# Patient Record
Sex: Male | Born: 1945 | Race: Black or African American | Hispanic: No | Smoking: Former smoker
Health system: Southern US, Community
[De-identification: ages and names within clinical notes are randomized; demographics above are authoritative.]

## PROBLEM LIST (undated history)

## (undated) DIAGNOSIS — M545 Low back pain: Secondary | ICD-10-CM

## (undated) DIAGNOSIS — I429 Cardiomyopathy, unspecified: Secondary | ICD-10-CM

## (undated) DIAGNOSIS — J45909 Unspecified asthma, uncomplicated: Secondary | ICD-10-CM

## (undated) DIAGNOSIS — Z9289 Personal history of other medical treatment: Secondary | ICD-10-CM

## (undated) DIAGNOSIS — K219 Gastro-esophageal reflux disease without esophagitis: Secondary | ICD-10-CM

## (undated) DIAGNOSIS — Z8719 Personal history of other diseases of the digestive system: Secondary | ICD-10-CM

## (undated) DIAGNOSIS — M79609 Pain in unspecified limb: Secondary | ICD-10-CM

## (undated) DIAGNOSIS — I2510 Atherosclerotic heart disease of native coronary artery without angina pectoris: Secondary | ICD-10-CM

## (undated) DIAGNOSIS — M109 Gout, unspecified: Secondary | ICD-10-CM

## (undated) DIAGNOSIS — I509 Heart failure, unspecified: Secondary | ICD-10-CM

## (undated) DIAGNOSIS — K922 Gastrointestinal hemorrhage, unspecified: Secondary | ICD-10-CM

## (undated) DIAGNOSIS — E119 Type 2 diabetes mellitus without complications: Secondary | ICD-10-CM

## (undated) DIAGNOSIS — J189 Pneumonia, unspecified organism: Secondary | ICD-10-CM

## (undated) DIAGNOSIS — R0602 Shortness of breath: Secondary | ICD-10-CM

## (undated) DIAGNOSIS — I10 Essential (primary) hypertension: Secondary | ICD-10-CM

## (undated) DIAGNOSIS — I219 Acute myocardial infarction, unspecified: Secondary | ICD-10-CM

## (undated) DIAGNOSIS — E7800 Pure hypercholesterolemia, unspecified: Secondary | ICD-10-CM

## (undated) DIAGNOSIS — M1990 Unspecified osteoarthritis, unspecified site: Secondary | ICD-10-CM

## (undated) DIAGNOSIS — R569 Unspecified convulsions: Secondary | ICD-10-CM

## (undated) DIAGNOSIS — J449 Chronic obstructive pulmonary disease, unspecified: Secondary | ICD-10-CM

## (undated) HISTORY — DX: Pain in unspecified limb: M79.609

## (undated) HISTORY — DX: Type 2 diabetes mellitus without complications: E11.9

## (undated) HISTORY — DX: Personal history of other medical treatment: Z92.89

## (undated) HISTORY — DX: Low back pain: M54.5

## (undated) HISTORY — DX: Cardiomyopathy, unspecified: I42.9

## (undated) HISTORY — DX: Chronic obstructive pulmonary disease, unspecified: J44.9

## (undated) HISTORY — DX: Essential (primary) hypertension: I10

## (undated) HISTORY — PX: CORONARY ANGIOPLASTY WITH STENT PLACEMENT: SHX49

## (undated) HISTORY — DX: Unspecified convulsions: R56.9

## (undated) HISTORY — DX: Personal history of other diseases of the digestive system: Z87.19

## (undated) HISTORY — DX: Unspecified asthma, uncomplicated: J45.909

---

## 1998-09-29 ENCOUNTER — Encounter: Payer: Self-pay | Admitting: Emergency Medicine

## 1998-09-30 ENCOUNTER — Observation Stay (HOSPITAL_COMMUNITY): Admission: EM | Admit: 1998-09-30 | Discharge: 1998-10-01 | Payer: Self-pay | Admitting: Emergency Medicine

## 1998-10-05 ENCOUNTER — Encounter: Admission: RE | Admit: 1998-10-05 | Discharge: 1998-10-05 | Payer: Self-pay | Admitting: Family Medicine

## 1999-08-26 LAB — HM SIGMOIDOSCOPY

## 2000-04-11 ENCOUNTER — Encounter: Payer: Self-pay | Admitting: Emergency Medicine

## 2000-04-11 ENCOUNTER — Inpatient Hospital Stay (HOSPITAL_COMMUNITY): Admission: EM | Admit: 2000-04-11 | Discharge: 2000-04-14 | Payer: Self-pay | Admitting: Emergency Medicine

## 2000-04-13 ENCOUNTER — Encounter: Payer: Self-pay | Admitting: Endocrinology

## 2000-12-30 ENCOUNTER — Emergency Department (HOSPITAL_COMMUNITY): Admission: EM | Admit: 2000-12-30 | Discharge: 2000-12-31 | Payer: Self-pay | Admitting: Emergency Medicine

## 2000-12-30 ENCOUNTER — Encounter: Payer: Self-pay | Admitting: Emergency Medicine

## 2001-10-01 ENCOUNTER — Emergency Department (HOSPITAL_COMMUNITY): Admission: EM | Admit: 2001-10-01 | Discharge: 2001-10-02 | Payer: Self-pay | Admitting: Emergency Medicine

## 2001-10-01 ENCOUNTER — Encounter: Payer: Self-pay | Admitting: Emergency Medicine

## 2001-10-03 ENCOUNTER — Encounter: Payer: Self-pay | Admitting: Emergency Medicine

## 2001-10-04 ENCOUNTER — Inpatient Hospital Stay (HOSPITAL_COMMUNITY): Admission: EM | Admit: 2001-10-04 | Discharge: 2001-10-09 | Payer: Self-pay | Admitting: Emergency Medicine

## 2001-10-08 ENCOUNTER — Encounter: Payer: Self-pay | Admitting: Internal Medicine

## 2001-10-19 ENCOUNTER — Encounter: Admission: RE | Admit: 2001-10-19 | Discharge: 2002-01-17 | Payer: Self-pay | Admitting: Internal Medicine

## 2002-05-24 ENCOUNTER — Emergency Department (HOSPITAL_COMMUNITY): Admission: EM | Admit: 2002-05-24 | Discharge: 2002-05-24 | Payer: Self-pay | Admitting: *Deleted

## 2002-07-22 ENCOUNTER — Emergency Department (HOSPITAL_COMMUNITY): Admission: EM | Admit: 2002-07-22 | Discharge: 2002-07-22 | Payer: Self-pay | Admitting: Emergency Medicine

## 2004-06-12 ENCOUNTER — Emergency Department (HOSPITAL_COMMUNITY): Admission: EM | Admit: 2004-06-12 | Discharge: 2004-06-12 | Payer: Self-pay | Admitting: Family Medicine

## 2004-06-13 ENCOUNTER — Emergency Department (HOSPITAL_COMMUNITY): Admission: EM | Admit: 2004-06-13 | Discharge: 2004-06-13 | Payer: Self-pay | Admitting: Family Medicine

## 2004-06-17 ENCOUNTER — Emergency Department (HOSPITAL_COMMUNITY): Admission: EM | Admit: 2004-06-17 | Discharge: 2004-06-17 | Payer: Self-pay | Admitting: Family Medicine

## 2004-10-09 ENCOUNTER — Emergency Department (HOSPITAL_COMMUNITY): Admission: EM | Admit: 2004-10-09 | Discharge: 2004-10-09 | Payer: Self-pay | Admitting: Family Medicine

## 2004-11-25 ENCOUNTER — Emergency Department (HOSPITAL_COMMUNITY): Admission: EM | Admit: 2004-11-25 | Discharge: 2004-11-25 | Payer: Self-pay | Admitting: Family Medicine

## 2004-12-29 ENCOUNTER — Ambulatory Visit: Payer: Self-pay | Admitting: Internal Medicine

## 2004-12-30 ENCOUNTER — Observation Stay (HOSPITAL_COMMUNITY): Admission: EM | Admit: 2004-12-30 | Discharge: 2004-12-30 | Payer: Self-pay | Admitting: Emergency Medicine

## 2005-01-09 ENCOUNTER — Ambulatory Visit: Payer: Self-pay | Admitting: Internal Medicine

## 2005-02-01 ENCOUNTER — Emergency Department (HOSPITAL_COMMUNITY): Admission: EM | Admit: 2005-02-01 | Discharge: 2005-02-01 | Payer: Self-pay | Admitting: Family Medicine

## 2005-04-14 ENCOUNTER — Ambulatory Visit: Payer: Self-pay | Admitting: Internal Medicine

## 2005-04-23 ENCOUNTER — Ambulatory Visit: Payer: Self-pay | Admitting: Internal Medicine

## 2005-05-21 ENCOUNTER — Emergency Department (HOSPITAL_COMMUNITY): Admission: EM | Admit: 2005-05-21 | Discharge: 2005-05-21 | Payer: Self-pay | Admitting: Emergency Medicine

## 2005-05-26 ENCOUNTER — Ambulatory Visit: Payer: Self-pay | Admitting: Internal Medicine

## 2005-06-05 ENCOUNTER — Ambulatory Visit: Payer: Self-pay | Admitting: Cardiology

## 2005-06-18 ENCOUNTER — Emergency Department (HOSPITAL_COMMUNITY): Admission: EM | Admit: 2005-06-18 | Discharge: 2005-06-18 | Payer: Self-pay | Admitting: Family Medicine

## 2005-07-25 ENCOUNTER — Emergency Department (HOSPITAL_COMMUNITY): Admission: EM | Admit: 2005-07-25 | Discharge: 2005-07-25 | Payer: Self-pay | Admitting: Family Medicine

## 2005-09-29 ENCOUNTER — Emergency Department (HOSPITAL_COMMUNITY): Admission: EM | Admit: 2005-09-29 | Discharge: 2005-09-29 | Payer: Self-pay | Admitting: Family Medicine

## 2005-10-25 ENCOUNTER — Emergency Department (HOSPITAL_COMMUNITY): Admission: EM | Admit: 2005-10-25 | Discharge: 2005-10-25 | Payer: Self-pay | Admitting: Family Medicine

## 2005-11-21 ENCOUNTER — Inpatient Hospital Stay (HOSPITAL_COMMUNITY): Admission: EM | Admit: 2005-11-21 | Discharge: 2005-12-01 | Payer: Self-pay | Admitting: Family Medicine

## 2005-11-21 ENCOUNTER — Ambulatory Visit: Payer: Self-pay | Admitting: Sports Medicine

## 2005-12-04 ENCOUNTER — Ambulatory Visit: Payer: Self-pay | Admitting: Internal Medicine

## 2006-01-17 ENCOUNTER — Emergency Department (HOSPITAL_COMMUNITY): Admission: EM | Admit: 2006-01-17 | Discharge: 2006-01-17 | Payer: Self-pay | Admitting: Family Medicine

## 2006-02-25 ENCOUNTER — Emergency Department (HOSPITAL_COMMUNITY): Admission: EM | Admit: 2006-02-25 | Discharge: 2006-02-25 | Payer: Self-pay | Admitting: Emergency Medicine

## 2006-03-03 ENCOUNTER — Ambulatory Visit: Payer: Self-pay | Admitting: Internal Medicine

## 2006-05-14 ENCOUNTER — Emergency Department (HOSPITAL_COMMUNITY): Admission: EM | Admit: 2006-05-14 | Discharge: 2006-05-14 | Payer: Self-pay | Admitting: Emergency Medicine

## 2006-06-02 ENCOUNTER — Encounter: Payer: Self-pay | Admitting: Internal Medicine

## 2006-06-03 ENCOUNTER — Ambulatory Visit: Payer: Self-pay | Admitting: Internal Medicine

## 2006-06-03 LAB — CONVERTED CEMR LAB
ALT: 19 units/L (ref 0–40)
AST: 24 units/L (ref 0–37)
Albumin: 3.5 g/dL (ref 3.5–5.2)
Alkaline Phosphatase: 46 units/L (ref 39–117)
BUN: 11 mg/dL (ref 6–23)
Basophils Absolute: 0 10*3/uL (ref 0.0–0.1)
Basophils Relative: 0.5 % (ref 0.0–1.0)
Bilirubin, Direct: 0.1 mg/dL (ref 0.0–0.3)
CO2: 31 meq/L (ref 19–32)
Calcium: 9 mg/dL (ref 8.4–10.5)
Chloride: 103 meq/L (ref 96–112)
Creatinine, Ser: 1.5 mg/dL (ref 0.4–1.5)
Eosinophil percent: 3.7 % (ref 0.0–5.0)
GFR calc non Af Amer: 50 mL/min
Glomerular Filtration Rate, Af Am: 61 mL/min/{1.73_m2}
Glucose, Bld: 103 mg/dL — ABNORMAL HIGH (ref 70–99)
HCT: 43.7 % (ref 39.0–52.0)
Hemoglobin: 14 g/dL (ref 13.0–17.0)
Lymphocytes Relative: 26.6 % (ref 12.0–46.0)
MCHC: 32 g/dL (ref 30.0–36.0)
MCV: 86.6 fL (ref 78.0–100.0)
Monocytes Absolute: 0.8 10*3/uL — ABNORMAL HIGH (ref 0.2–0.7)
Monocytes Relative: 8.7 % (ref 3.0–11.0)
Neutro Abs: 5.3 10*3/uL (ref 1.4–7.7)
Neutrophils Relative %: 60.5 % (ref 43.0–77.0)
PSA: 0.37 ng/mL (ref 0.10–4.00)
Platelets: 277 10*3/uL (ref 150–400)
Potassium: 4 meq/L (ref 3.5–5.1)
RBC: 5.04 M/uL (ref 4.22–5.81)
RDW: 14.7 % — ABNORMAL HIGH (ref 11.5–14.6)
Sodium: 144 meq/L (ref 135–145)
Total Bilirubin: 0.6 mg/dL (ref 0.3–1.2)
Total Protein: 6.1 g/dL (ref 6.0–8.3)
WBC: 8.7 10*3/uL (ref 4.5–10.5)

## 2006-09-04 ENCOUNTER — Ambulatory Visit: Payer: Self-pay | Admitting: Internal Medicine

## 2006-09-16 ENCOUNTER — Emergency Department (HOSPITAL_COMMUNITY): Admission: EM | Admit: 2006-09-16 | Discharge: 2006-09-16 | Payer: Self-pay | Admitting: Family Medicine

## 2006-09-17 ENCOUNTER — Ambulatory Visit: Payer: Self-pay | Admitting: Internal Medicine

## 2006-11-05 ENCOUNTER — Emergency Department (HOSPITAL_COMMUNITY): Admission: EM | Admit: 2006-11-05 | Discharge: 2006-11-05 | Payer: Self-pay | Admitting: Family Medicine

## 2006-12-10 ENCOUNTER — Ambulatory Visit: Payer: Self-pay | Admitting: Internal Medicine

## 2006-12-10 LAB — CONVERTED CEMR LAB: Hgb A1c MFr Bld: 6.8 % — ABNORMAL HIGH (ref 4.6–6.0)

## 2007-01-21 ENCOUNTER — Ambulatory Visit: Payer: Self-pay | Admitting: Internal Medicine

## 2007-04-14 DIAGNOSIS — E119 Type 2 diabetes mellitus without complications: Secondary | ICD-10-CM

## 2007-04-14 DIAGNOSIS — I10 Essential (primary) hypertension: Secondary | ICD-10-CM | POA: Insufficient documentation

## 2007-04-14 DIAGNOSIS — J4489 Other specified chronic obstructive pulmonary disease: Secondary | ICD-10-CM

## 2007-04-14 DIAGNOSIS — Z8719 Personal history of other diseases of the digestive system: Secondary | ICD-10-CM | POA: Insufficient documentation

## 2007-04-14 DIAGNOSIS — R569 Unspecified convulsions: Secondary | ICD-10-CM | POA: Insufficient documentation

## 2007-04-14 DIAGNOSIS — J449 Chronic obstructive pulmonary disease, unspecified: Secondary | ICD-10-CM

## 2007-04-14 DIAGNOSIS — E1129 Type 2 diabetes mellitus with other diabetic kidney complication: Secondary | ICD-10-CM | POA: Insufficient documentation

## 2007-04-14 DIAGNOSIS — J45909 Unspecified asthma, uncomplicated: Secondary | ICD-10-CM

## 2007-04-14 DIAGNOSIS — G8929 Other chronic pain: Secondary | ICD-10-CM

## 2007-04-14 DIAGNOSIS — M5440 Lumbago with sciatica, unspecified side: Secondary | ICD-10-CM

## 2007-04-14 DIAGNOSIS — E785 Hyperlipidemia, unspecified: Secondary | ICD-10-CM | POA: Insufficient documentation

## 2007-04-14 DIAGNOSIS — M545 Low back pain, unspecified: Secondary | ICD-10-CM

## 2007-04-14 DIAGNOSIS — Z8709 Personal history of other diseases of the respiratory system: Secondary | ICD-10-CM | POA: Insufficient documentation

## 2007-04-14 HISTORY — DX: Chronic obstructive pulmonary disease, unspecified: J44.9

## 2007-04-14 HISTORY — DX: Type 2 diabetes mellitus without complications: E11.9

## 2007-04-14 HISTORY — DX: Low back pain, unspecified: M54.50

## 2007-04-14 HISTORY — DX: Unspecified convulsions: R56.9

## 2007-04-14 HISTORY — DX: Personal history of other diseases of the digestive system: Z87.19

## 2007-04-14 HISTORY — DX: Other specified chronic obstructive pulmonary disease: J44.89

## 2007-04-14 HISTORY — DX: Essential (primary) hypertension: I10

## 2007-04-16 ENCOUNTER — Ambulatory Visit: Payer: Self-pay | Admitting: Internal Medicine

## 2007-04-16 LAB — CONVERTED CEMR LAB: Blood Glucose, Fingerstick: 82

## 2007-05-05 ENCOUNTER — Ambulatory Visit (HOSPITAL_COMMUNITY): Admission: RE | Admit: 2007-05-05 | Discharge: 2007-05-05 | Payer: Self-pay | Admitting: Family Medicine

## 2007-05-05 ENCOUNTER — Emergency Department (HOSPITAL_COMMUNITY): Admission: EM | Admit: 2007-05-05 | Discharge: 2007-05-05 | Payer: Self-pay | Admitting: Family Medicine

## 2007-06-14 ENCOUNTER — Encounter: Payer: Self-pay | Admitting: Internal Medicine

## 2007-08-17 ENCOUNTER — Ambulatory Visit: Payer: Self-pay | Admitting: Internal Medicine

## 2007-08-17 LAB — CONVERTED CEMR LAB
ALT: 14 units/L (ref 0–53)
AST: 27 units/L (ref 0–37)
Albumin: 4 g/dL (ref 3.5–5.2)
Alkaline Phosphatase: 66 units/L (ref 39–117)
BUN: 14 mg/dL (ref 6–23)
Basophils Absolute: 0.1 10*3/uL (ref 0.0–0.1)
Basophils Relative: 1.6 % — ABNORMAL HIGH (ref 0.0–1.0)
Bilirubin, Direct: 0.2 mg/dL (ref 0.0–0.3)
Blood Glucose, Fingerstick: 91
CO2: 29 meq/L (ref 19–32)
Calcium: 9.6 mg/dL (ref 8.4–10.5)
Chloride: 103 meq/L (ref 96–112)
Creatinine, Ser: 1.5 mg/dL (ref 0.4–1.5)
Eosinophils Absolute: 0.7 10*3/uL — ABNORMAL HIGH (ref 0.0–0.6)
Eosinophils Relative: 9.6 % — ABNORMAL HIGH (ref 0.0–5.0)
GFR calc Af Amer: 61 mL/min
GFR calc non Af Amer: 50 mL/min
Glucose, Bld: 100 mg/dL — ABNORMAL HIGH (ref 70–99)
HCT: 41.5 % (ref 39.0–52.0)
Hemoglobin: 13.9 g/dL (ref 13.0–17.0)
Hgb A1c MFr Bld: 6.7 % — ABNORMAL HIGH (ref 4.6–6.0)
Lymphocytes Relative: 41.6 % (ref 12.0–46.0)
MCHC: 33.6 g/dL (ref 30.0–36.0)
MCV: 84.4 fL (ref 78.0–100.0)
Monocytes Absolute: 0.8 10*3/uL — ABNORMAL HIGH (ref 0.2–0.7)
Monocytes Relative: 11 % (ref 3.0–11.0)
Neutro Abs: 2.5 10*3/uL (ref 1.4–7.7)
Neutrophils Relative %: 36.2 % — ABNORMAL LOW (ref 43.0–77.0)
PSA: 0.28 ng/mL (ref 0.10–4.00)
Platelets: 306 10*3/uL (ref 150–400)
Potassium: 5.4 meq/L — ABNORMAL HIGH (ref 3.5–5.1)
RBC: 4.91 M/uL (ref 4.22–5.81)
RDW: 13.8 % (ref 11.5–14.6)
Sodium: 140 meq/L (ref 135–145)
Total Bilirubin: 0.8 mg/dL (ref 0.3–1.2)
Total Protein: 6.7 g/dL (ref 6.0–8.3)
WBC: 6.9 10*3/uL (ref 4.5–10.5)

## 2007-11-17 ENCOUNTER — Ambulatory Visit: Payer: Self-pay | Admitting: Internal Medicine

## 2007-11-17 LAB — CONVERTED CEMR LAB: Blood Glucose, Fingerstick: 101

## 2008-02-18 ENCOUNTER — Ambulatory Visit: Payer: Self-pay | Admitting: Internal Medicine

## 2008-02-18 LAB — CONVERTED CEMR LAB
Blood Glucose, Fingerstick: 63
Hgb A1c MFr Bld: 6.6 % — ABNORMAL HIGH (ref 4.6–6.0)

## 2008-05-19 ENCOUNTER — Ambulatory Visit: Payer: Self-pay | Admitting: Internal Medicine

## 2008-05-19 DIAGNOSIS — M79609 Pain in unspecified limb: Secondary | ICD-10-CM

## 2008-05-19 HISTORY — DX: Pain in unspecified limb: M79.609

## 2008-05-19 LAB — CONVERTED CEMR LAB
Blood Glucose, Fingerstick: 92
Hgb A1c MFr Bld: 6.4 % — ABNORMAL HIGH (ref 4.6–6.0)

## 2008-07-25 ENCOUNTER — Telehealth: Payer: Self-pay | Admitting: Internal Medicine

## 2008-08-07 ENCOUNTER — Ambulatory Visit: Payer: Self-pay | Admitting: Internal Medicine

## 2008-08-07 LAB — CONVERTED CEMR LAB: Blood Glucose, Fingerstick: 105

## 2008-12-17 ENCOUNTER — Encounter: Payer: Self-pay | Admitting: Internal Medicine

## 2008-12-18 ENCOUNTER — Ambulatory Visit: Payer: Self-pay | Admitting: Internal Medicine

## 2008-12-18 DIAGNOSIS — R079 Chest pain, unspecified: Secondary | ICD-10-CM | POA: Insufficient documentation

## 2008-12-18 LAB — CONVERTED CEMR LAB
Blood Glucose, Fingerstick: 103
Hgb A1c MFr Bld: 6.4 % (ref 4.6–6.5)
Troponin I: 0.04 ng/mL (ref <0.06)

## 2008-12-19 ENCOUNTER — Telehealth (INDEPENDENT_AMBULATORY_CARE_PROVIDER_SITE_OTHER): Payer: Self-pay | Admitting: *Deleted

## 2008-12-20 ENCOUNTER — Ambulatory Visit: Payer: Self-pay

## 2008-12-20 ENCOUNTER — Encounter: Payer: Self-pay | Admitting: Internal Medicine

## 2009-03-20 ENCOUNTER — Ambulatory Visit: Payer: Self-pay | Admitting: Internal Medicine

## 2009-03-20 LAB — CONVERTED CEMR LAB
Blood Glucose, Fingerstick: 101
Hgb A1c MFr Bld: 6.2 % (ref 4.6–6.5)

## 2009-06-20 ENCOUNTER — Ambulatory Visit: Payer: Self-pay | Admitting: Internal Medicine

## 2009-06-20 LAB — CONVERTED CEMR LAB: Blood Glucose, Fingerstick: 111

## 2009-09-21 ENCOUNTER — Ambulatory Visit: Payer: Self-pay | Admitting: Internal Medicine

## 2009-09-21 LAB — CONVERTED CEMR LAB: Blood Glucose, Fingerstick: 90

## 2009-09-21 LAB — HM DIABETES EYE EXAM: HM Diabetic Eye Exam: NORMAL

## 2009-09-24 ENCOUNTER — Telehealth (INDEPENDENT_AMBULATORY_CARE_PROVIDER_SITE_OTHER): Payer: Self-pay | Admitting: *Deleted

## 2009-10-23 ENCOUNTER — Encounter (INDEPENDENT_AMBULATORY_CARE_PROVIDER_SITE_OTHER): Payer: Self-pay | Admitting: *Deleted

## 2009-12-20 ENCOUNTER — Ambulatory Visit: Payer: Self-pay | Admitting: Internal Medicine

## 2009-12-20 LAB — CONVERTED CEMR LAB: Blood Glucose, Fingerstick: 106

## 2010-03-21 ENCOUNTER — Ambulatory Visit: Payer: Self-pay | Admitting: Internal Medicine

## 2010-03-21 LAB — CONVERTED CEMR LAB
ALT: 17 units/L (ref 0–53)
Albumin: 4 g/dL (ref 3.5–5.2)
BUN: 20 mg/dL (ref 6–23)
Basophils Relative: 0.6 % (ref 0.0–3.0)
Blood Glucose, Fingerstick: 112
CO2: 29 meq/L (ref 19–32)
Chloride: 99 meq/L (ref 96–112)
Cholesterol: 179 mg/dL (ref 0–200)
Eosinophils Absolute: 0.6 10*3/uL (ref 0.0–0.7)
Eosinophils Relative: 8.6 % — ABNORMAL HIGH (ref 0.0–5.0)
HCT: 40.8 % (ref 39.0–52.0)
Hgb A1c MFr Bld: 6.5 % (ref 4.6–6.5)
LDL Cholesterol: 119 mg/dL — ABNORMAL HIGH (ref 0–99)
Lymphs Abs: 2.8 10*3/uL (ref 0.7–4.0)
MCHC: 33.2 g/dL (ref 30.0–36.0)
MCV: 85.4 fL (ref 78.0–100.0)
Monocytes Absolute: 0.8 10*3/uL (ref 0.1–1.0)
Neutro Abs: 2.9 10*3/uL (ref 1.4–7.7)
PSA: 0.37 ng/mL (ref 0.10–4.00)
Potassium: 5.1 meq/L (ref 3.5–5.1)
RBC: 4.78 M/uL (ref 4.22–5.81)
TSH: 2.6 microintl units/mL (ref 0.35–5.50)
Total CHOL/HDL Ratio: 4
Total Protein: 6.8 g/dL (ref 6.0–8.3)
Triglycerides: 79 mg/dL (ref 0.0–149.0)
WBC: 7.1 10*3/uL (ref 4.5–10.5)

## 2010-03-21 LAB — HM DIABETES FOOT EXAM

## 2010-06-20 ENCOUNTER — Encounter: Payer: Self-pay | Admitting: *Deleted

## 2010-06-20 ENCOUNTER — Ambulatory Visit: Payer: Self-pay | Admitting: Internal Medicine

## 2010-09-15 ENCOUNTER — Encounter: Payer: Self-pay | Admitting: Internal Medicine

## 2010-09-19 ENCOUNTER — Ambulatory Visit
Admission: RE | Admit: 2010-09-19 | Discharge: 2010-09-19 | Payer: Self-pay | Source: Home / Self Care | Attending: Internal Medicine | Admitting: Internal Medicine

## 2010-09-20 ENCOUNTER — Telehealth (INDEPENDENT_AMBULATORY_CARE_PROVIDER_SITE_OTHER): Payer: Self-pay | Admitting: *Deleted

## 2010-09-24 NOTE — Assessment & Plan Note (Signed)
Summary: 3 month rov/njr   Vital Signs:  Patient profile:   65 year old male Weight:      194 pounds Temp:     97.6 degrees F oral BP sitting:   140 / 80  (right arm) Cuff size:   regular  Vitals Entered By: Duard Brady LPN (December 20, 2009 8:00 AM) CC: 3 mos rov - c/o cough, congestion Is Patient Diabetic? Yes CBG Result 106   CC:  3 mos rov - c/o cough and congestion.  History of Present Illness: 65 -year-old patient who has a history of COPD and asthma.  He also has a history of type 2 diabetes and hypertension.  Presently controlled without medication.  He has a history of osteoarthritis and chronic low back pain. For the past two weeks.  He has had increasing head and especially chest congestion.  He has developed worsening productive cough, productive of yellow sputum.  There's been no wheezing, but he is at minimal shortness of breath.  Denies any chest pain  Preventive Screening-Counseling & Management  Alcohol-Tobacco     Smoking Status: current  Allergies (verified): No Known Drug Allergies  Past History:  Past Medical History: Reviewed history from 04/14/2007 and no changes required. Asthma COPD Hypertension blind Diabetes mellitus, type II Pancreatitis, hx of Pneumonia, hx of Seizure disorder Low back pain  Past Surgical History: Reviewed history from 11/17/2007 and no changes required. Flexible Sigmoidoscopy 2001  Review of Systems       The patient complains of anorexia, hoarseness, and prolonged cough.  The patient denies fever, weight loss, weight gain, vision loss, decreased hearing, chest pain, syncope, dyspnea on exertion, peripheral edema, headaches, hemoptysis, abdominal pain, melena, hematochezia, severe indigestion/heartburn, hematuria, incontinence, genital sores, muscle weakness, suspicious skin lesions, transient blindness, difficulty walking, depression, unusual weight change, abnormal bleeding, enlarged lymph nodes, angioedema,  breast masses, and testicular masses.    Physical Exam  General:  Well-developed,well-nourished,in no acute distress; alert,appropriate and cooperative throughout examination Head:  Normocephalic and atraumatic without obvious abnormalities. No apparent alopecia or balding. Eyes:  No corneal or conjunctival inflammation noted. EOMI. Perrla. Funduscopic exam benign, without hemorrhages, exudates or papilledema. Vision grossly normal. Ears:  External ear exam shows no significant lesions or deformities.  Otoscopic examination reveals clear canals, tympanic membranes are intact bilaterally without bulging, retraction, inflammation or discharge. Hearing is grossly normal bilaterally. Mouth:  Oral mucosa and oropharynx without lesions or exudates.  Teeth in good repair. Neck:  No deformities, masses, or tenderness noted. Lungs:  scattered coarse rhonchi heard diffusely posteriorly.  No active wheezing or increased work of breathing Heart:  Normal rate and regular rhythm. S1 and S2 normal without gallop, murmur, click, rub or other extra sounds. Abdomen:  Bowel sounds positive,abdomen soft and non-tender without masses, organomegaly or hernias noted. Msk:  No deformity or scoliosis noted of thoracic or lumbar spine.   Pulses:  R and L carotid,radial,femoral,dorsalis pedis and posterior tibial pulses are full and equal bilaterally   Impression & Recommendations:  Problem # 1:  LOW BACK PAIN (ICD-724.2)  His updated medication list for this problem includes:    Hydrocodone-acetaminophen 7.5-750 Mg Tabs (Hydrocodone-acetaminophen) ..... One every 6 hrs for pain    Aspir-low 81 Mg Tbec (Aspirin) ..... One daily    Naproxen 500 Mg Tabs (Naproxen) .Marland Kitchen... 1 by mouth two times a day as needed  His updated medication list for this problem includes:    Hydrocodone-acetaminophen 7.5-750 Mg Tabs (Hydrocodone-acetaminophen) ..... One every 6  hrs for pain    Aspir-low 81 Mg Tbec (Aspirin) ..... One daily     Naproxen 500 Mg Tabs (Naproxen) .Marland Kitchen... 1 by mouth two times a day as needed  Problem # 2:  DIABETES MELLITUS, TYPE II (ICD-250.00)  His updated medication list for this problem includes:    Metformin Hcl 500 Mg Tabs (Metformin hcl) .Marland Kitchen... 1 two times a day    Aspir-low 81 Mg Tbec (Aspirin) ..... One daily  Orders: Capillary Blood Glucose/CBG (16109)  His updated medication list for this problem includes:    Metformin Hcl 500 Mg Tabs (Metformin hcl) .Marland Kitchen... 1 two times a day    Aspir-low 81 Mg Tbec (Aspirin) ..... One daily  Problem # 3:  HYPERTENSION (ICD-401.9)  Problem # 4:  COPD (ICD-496)  His updated medication list for this problem includes:    Advair Diskus 250-50 Mcg/dose Misc (Fluticasone-salmeterol) ..... Use two times a day    Albuterol 90 Mcg/act Aers (Albuterol) ..... Use two times a day as needed    Singulair 10 Mg Tabs (Montelukast sodium) ..... One daily patient has an exacerbation of his COPD.  Will treat  with antibiotic therapy and continue his maintenance medication  His updated medication list for this problem includes:    Advair Diskus 250-50 Mcg/dose Misc (Fluticasone-salmeterol) ..... Use two times a day    Albuterol 90 Mcg/act Aers (Albuterol) ..... Use two times a day as needed    Singulair 10 Mg Tabs (Montelukast sodium) ..... One daily  Complete Medication List: 1)  Advair Diskus 250-50 Mcg/dose Misc (Fluticasone-salmeterol) .... Use two times a day 2)  Albuterol 90 Mcg/act Aers (Albuterol) .... Use two times a day as needed 3)  Metformin Hcl 500 Mg Tabs (Metformin hcl) .Marland Kitchen.. 1 two times a day 4)  Singulair 10 Mg Tabs (Montelukast sodium) .... One daily 5)  Hydrocodone-acetaminophen 7.5-750 Mg Tabs (Hydrocodone-acetaminophen) .... One every 6 hrs for pain 6)  Aspir-low 81 Mg Tbec (Aspirin) .... One daily 7)  Naproxen 500 Mg Tabs (Naproxen) .Marland Kitchen.. 1 by mouth two times a day as needed 8)  Doxycycline Hyclate 100 Mg Caps (Doxycycline hyclate) .... One twice  daily  Patient Instructions: 1)  Get plenty of rest, drink lots of clear liquids, and use Tylenol or Ibuprofen for fever and comfort. Return in 7-10 days if you're not better:sooner if you're feeling worse. 2)  Please schedule a follow-up appointment in 3 months. 3)  Limit your Sodium (Salt) to less than 2 grams a day(slightly less than 1/2 a teaspoon) to prevent fluid retention, swelling, or worsening of symptoms. 4)  Check your blood sugars regularly. If your readings are usually above : or below 70 you should contact our office. 5)  It is important that your Diabetic A1c level is checked every 3 months. 6)  See your eye doctor yearly to check for diabetic eye damage. Prescriptions: DOXYCYCLINE HYCLATE 100 MG CAPS (DOXYCYCLINE HYCLATE) one twice daily  #20 x 0   Entered and Authorized by:   Gordy Savers  MD   Signed by:   Gordy Savers  MD on 12/20/2009   Method used:   Electronically to        [facility] [phone]* (retail)       [address]       [place], [place]  [address]       Ph: [phone] or [phone]       Fax: [phone]   RxID:  [id number]  

## 2010-09-24 NOTE — Assessment & Plan Note (Signed)
Summary: cpx /pt will be fasting/mm   Vital Signs:  Patient profile:   65 year old male Weight:      190 pounds BMI:     28.16 Temp:     97.8 degrees F oral BP sitting:   100 / 70  (left arm) Cuff size:   regular  Vitals Entered By: Raechel Ache, RN (September 21, 2009 8:39 AM) CC: OV, fasting Is Patient Diabetic? Yes CBG Result 90   CC:  OV and fasting.  History of Present Illness: 65 year old patient who is seen today for a comprehensive evaluation.  Medical problems include COPD, and asthma.  He has ongoing sporadic tobacco use.  He has type 2 diabetes, hypertension, and also a history of chronic low back pain.  He also is a history of chest pain and underwent Bickler medicine stress test approximately 10 months ago.  That was negative.  Only complaints today are neck and shoulder discomfort.  Blood sugars have been stable and the fasting blood sugar today 90  Preventive Screening-Counseling & Management  Alcohol-Tobacco     Smoking Cessation Counseling: yes  Allergies: No Known Drug Allergies  Past History:  Past Medical History: Reviewed history from 04/14/2007 and no changes required. Asthma COPD Hypertension blind Diabetes mellitus, type II Pancreatitis, hx of Pneumonia, hx of Seizure disorder Low back pain  Past Surgical History: Reviewed history from 11/17/2007 and no changes required. Flexible Sigmoidoscopy 2001  Family History: Reviewed history from 08/17/2007 and no changes required. Family History of Cardiovascular disorder father died at 41 murdered mother died in her 81s also murdered  Four brothers one sister positive for cardiac disease  Social History: Reviewed history from 04/14/2007 and no changes required. Married Current Smoker  Review of Systems  The patient denies anorexia, fever, weight loss, weight gain, vision loss, decreased hearing, hoarseness, chest pain, syncope, dyspnea on exertion, peripheral edema, prolonged cough,  headaches, hemoptysis, abdominal pain, melena, hematochezia, severe indigestion/heartburn, hematuria, incontinence, genital sores, muscle weakness, suspicious skin lesions, transient blindness, difficulty walking, depression, unusual weight change, abnormal bleeding, enlarged lymph nodes, angioedema, breast masses, and testicular masses.    Physical Exam  General:  Well-developed,well-nourished,in no acute distress; alert,appropriate and cooperative throughout examination; 122/70 Head:  Normocephalic and atraumatic without obvious abnormalities. No apparent alopecia or balding. Eyes:  No corneal or conjunctival inflammation noted. EOMI. Perrla. Funduscopic exam benign, without hemorrhages, exudates or papilledema. Vision grossly normal. Ears:  External ear exam shows no significant lesions or deformities.  Otoscopic examination reveals clear canals, tympanic membranes are intact bilaterally without bulging, retraction, inflammation or discharge. Hearing is grossly normal bilaterally. Nose:  External nasal examination shows no deformity or inflammation. Nasal mucosa are pink and moist without lesions or exudates. Mouth:  Oral mucosa and oropharynx without lesions or exudates.  scattered missing dentition Neck:  No deformities, masses, or tenderness noted. Chest Wall:  No deformities, masses, tenderness or gynecomastia noted. Breasts:  No masses or gynecomastia noted Lungs:  Normal respiratory effort, chest expands symmetrically. Lungs are clear to auscultation, no crackles or wheezes. Heart:  Normal rate and regular rhythm. S1 and S2 normal without gallop, murmur, click, rub or other extra sounds. Abdomen:  Bowel sounds positive,abdomen soft and non-tender without masses, organomegaly or hernias noted. Rectal:  No external abnormalities noted. Normal sphincter tone. No rectal masses or tenderness. Genitalia:  Testes bilaterally descended without nodularity, tenderness or masses. No scrotal masses or  lesions. No penis lesions or urethral discharge. Prostate:  2+ enlarged.  2+ enlarged.   Msk:  No deformity or scoliosis noted of thoracic or lumbar spine.   Pulses:  R and L carotid,radial,femoral,dorsalis pedis and posterior tibial pulses are full and equal bilaterally Extremities:  No clubbing, cyanosis, edema, or deformity noted with normal full range of motion of all joints.   Neurologic:  alert & oriented X3, cranial nerves II-XII intact, strength normal in all extremities, sensation intact to light touch, gait normal, and DTRs symmetrical and normal.  alert & oriented X3, cranial nerves II-XII intact, strength normal in all extremities, sensation intact to light touch, gait normal, and DTRs symmetrical and normal.   Skin:  Intact without suspicious lesions or rashes Cervical Nodes:  No lymphadenopathy noted Axillary Nodes:  No palpable lymphadenopathy Inguinal Nodes:  No significant adenopathy Psych:  Cognition and judgment appear intact. Alert and cooperative with normal attention span and concentration. No apparent delusions, illusions, hallucinations  Diabetes Management Exam:    Foot Exam (with socks and/or shoes not present):       Sensory-Pinprick/Light touch:          Left medial foot (L-4): normal          Left dorsal foot (L-5): normal          Left lateral foot (S-1): normal          Right medial foot (L-4): normal          Right dorsal foot (L-5): normal          Right lateral foot (S-1): normal       Sensory-Monofilament:          Left foot: normal          Right foot: normal       Inspection:          Left foot: normal          Right foot: normal       Nails:          Left foot: normal          Right foot: normal    Foot Exam by Podiatrist:       Date: 09/21/2009       Results: no diabetic findings       Done by: PCP    Eye Exam:       Eye Exam done here today          Results: normal   Impression & Recommendations:  Problem # 1:  LOW BACK PAIN  (ICD-724.2)  His updated medication list for this problem includes:    Hydrocodone-acetaminophen 7.5-750 Mg Tabs (Hydrocodone-acetaminophen) ..... One every 6 hrs for pain    Aspir-low 81 Mg Tbec (Aspirin) ..... One daily    Naproxen 500 Mg Tabs (Naproxen) .Marland Kitchen... 1 by mouth two times a day as needed  His updated medication list for this problem includes:    Hydrocodone-acetaminophen 7.5-750 Mg Tabs (Hydrocodone-acetaminophen) ..... One every 6 hrs for pain    Aspir-low 81 Mg Tbec (Aspirin) ..... One daily    Naproxen 500 Mg Tabs (Naproxen) .Marland Kitchen... 1 by mouth two times a day as needed  Problem # 2:  DIABETES MELLITUS, TYPE II (ICD-250.00)  His updated medication list for this problem includes:    Metformin Hcl 500 Mg Tabs (Metformin hcl) .Marland Kitchen... 1 two times a day    Aspir-low 81 Mg Tbec (Aspirin) ..... One daily  Orders: Capillary Blood Glucose/CBG (16109) Venipuncture (60454) TLB-Lipid Panel (80061-LIPID) TLB-BMP (Basic Metabolic Panel-BMET) (80048-METABOL)  TLB-CBC Platelet - w/Differential (85025-CBCD) TLB-Hepatic/Liver Function Pnl (80076-HEPATIC) TLB-TSH (Thyroid Stimulating Hormone) (84443-TSH) TLB-A1C / Hgb A1C (Glycohemoglobin) (83036-A1C)  His updated medication list for this problem includes:    Metformin Hcl 500 Mg Tabs (Metformin hcl) .Marland Kitchen... 1 two times a day    Aspir-low 81 Mg Tbec (Aspirin) ..... One daily  Problem # 3:  HYPERTENSION (ICD-401.9)  Orders: Venipuncture (16109) TLB-BMP (Basic Metabolic Panel-BMET) (80048-METABOL) TLB-CBC Platelet - w/Differential (85025-CBCD) TLB-Hepatic/Liver Function Pnl (80076-HEPATIC)  Problem # 4:  COPD (ICD-496)  His updated medication list for this problem includes:    Advair Diskus 250-50 Mcg/dose Misc (Fluticasone-salmeterol) ..... Use two times a day    Albuterol 90 Mcg/act Aers (Albuterol) ..... Use two times a day as needed    Singulair 10 Mg Tabs (Montelukast sodium) ..... One daily  His updated medication list for this  problem includes:    Advair Diskus 250-50 Mcg/dose Misc (Fluticasone-salmeterol) ..... Use two times a day    Albuterol 90 Mcg/act Aers (Albuterol) ..... Use two times a day as needed    Singulair 10 Mg Tabs (Montelukast sodium) ..... One daily  Orders: Venipuncture (60454) TLB-BMP (Basic Metabolic Panel-BMET) (80048-METABOL) TLB-CBC Platelet - w/Differential (85025-CBCD) TLB-Hepatic/Liver Function Pnl (80076-HEPATIC)  Complete Medication List: 1)  Advair Diskus 250-50 Mcg/dose Misc (Fluticasone-salmeterol) .... Use two times a day 2)  Albuterol 90 Mcg/act Aers (Albuterol) .... Use two times a day as needed 3)  Metformin Hcl 500 Mg Tabs (Metformin hcl) .Marland Kitchen.. 1 two times a day 4)  Singulair 10 Mg Tabs (Montelukast sodium) .... One daily 5)  Hydrocodone-acetaminophen 7.5-750 Mg Tabs (Hydrocodone-acetaminophen) .... One every 6 hrs for pain 6)  Aspir-low 81 Mg Tbec (Aspirin) .... One daily 7)  Hydromet 5-1.5 Mg/72ml Syrp (Hydrocodone-homatropine) .... 2 tsp every 6 hrs as needed for cough 8)  Naproxen 500 Mg Tabs (Naproxen) .Marland Kitchen.. 1 by mouth two times a day as needed  Other Orders: Gastroenterology Referral (GI) DRE (G0102) TLB-PSA (Prostate Specific Antigen) (84153-PSA)  Patient Instructions: 1)  Tobacco is very bad for your health and your loved ones! You Should stop smoking!. 2)  It is important that you exercise regularly at least 20 minutes 5 times a week. If you develop chest pain, have severe difficulty breathing, or feel very tired , stop exercising immediately and seek medical attention. 3)  Schedule a colonoscopy/sigmoidoscopy to help detect colon cancer. 4)  Check your blood sugars regularly. If your readings are usually above : or below 70 you should contact our office. 5)  It is important that your Diabetic A1c level is checked every 3 months. 6)  See your eye doctor yearly to check for diabetic eye damage. Prescriptions: NAPROXEN 500 MG  TABS (NAPROXEN) 1 by mouth two times a  day as needed  #180 x 3   Entered and Authorized by:   Gordy Savers  MD   Signed by:   Gordy Savers  MD on 09/21/2009   Method used:   Print then Give to Patient   RxID:   [id number] HYDROCODONE-ACETAMINOPHEN 7.5-750 MG  TABS (HYDROCODONE-ACETAMINOPHEN) one every 6 hrs for pain  #90 x 4   Entered and Authorized by:   Gordy Savers  MD   Signed by:   Gordy Savers  MD on 09/21/2009   Method used:   Print then Give to Patient   RxID:   [id number] SINGULAIR 10 MG  TABS (MONTELUKAST SODIUM) one daily  #90 Tablet x 5   Entered and  Authorized by:   Gordy Savers  MD   Signed by:   Gordy Savers  MD on 09/21/2009   Method used:   Print then Give to Patient   RxID:   [id number] ALBUTEROL 90 MCG/ACT  AERS (ALBUTEROL) use two times a day as needed  #3 x 6   Entered and Authorized by:   Gordy Savers  MD   Signed by:   Gordy Savers  MD on 09/21/2009   Method used:   Print then Give to Patient   RxID:   [id number] METFORMIN HCL 500 MG  TABS (METFORMIN HCL) 1 two times a day  #180 x 6   Entered and Authorized by:   Gordy Savers  MD   Signed by:   Gordy Savers  MD on 09/21/2009   Method used:   Print then Give to Patient   RxID:   [id number] ADVAIR DISKUS 250-50 MCG/DOSE  MISC (FLUTICASONE-SALMETEROL) use two times a day  #3 x 6   Entered and Authorized by:   Gordy Savers  MD   Signed by:   Gordy Savers  MD on 09/21/2009   Method used:   Print then Give to Patient   RxID:   [id number]

## 2010-09-24 NOTE — Progress Notes (Signed)
  Patient didn't come to the lab so it wasn't done. Patient will do labs on next visit if that's ok.

## 2010-09-24 NOTE — Letter (Signed)
Summary: [facility] Referral (unable to schedule) Notification  [facility]  [address] [place], [place] [address]   Phone: [phone]  Fax: [phone]      October 23, 2009 Luis Carter July 05, 1946 MRN: [id number]   Luis Carter [address] [place], [place]  [address]   Dear Dr. Amador Cunas:   Thank you for your kind referral of the above patient. We have attempted to schedule the recommended Colonoscopy but have been unable to schedule because:  __ The patient was not available by phone and/or has not returned our calls.  _x_ The patient declined to schedule the procedure at this time.  We appreciate the referral and hope that we will have the opportunity to treat this patient in the future.    Sincerely,   [facility]  Vania Rea. Jarold Motto M.D. Hedwig Morton. Juanda Chance M.D. Venita Lick. Russella Dar M.D. Wilhemina Bonito. Marina Goodell M.D. Barbette Hair. Arlyce Dice M.D. Iva Boop M.D. Cheron Every.D.

## 2010-09-24 NOTE — Assessment & Plan Note (Signed)
Vital Signs:  Patient profile:   65 year old [place] Weight:      194 pounds BMI:     28.75 Temp:     98.1 degrees F oral BP sitting:   134 / 86  (left arm) Cuff size:   regular  Vitals Entered By: Alfred Levins, CMA (June 20, 2010 8:15 AM) CC: f/u, fasting   History of Present Illness: 65 year old patient who has a history of COPD.  He has hypertension impaired glucose tolerance and a history of chronic low back pain.  History complaint today is bilateral leg pain at night.  he is on naproxen.  His last hemoglobin A1c6.5.  He is on metformin, which she continues to tolerate well.  He has a history of pancreatitis.  Other than his leg pain.  He has no new complaints.  He remains on maintenance Advair, albuterol  Preventive Screening-Counseling & Management  Alcohol-Tobacco     Smoking Cessation Counseling: YES  Current Medications (verified): 1)  Advair Diskus 250-50 Mcg/dose  Misc (Fluticasone-Salmeterol) .... Use Two Times A Day 2)  Albuterol 90 Mcg/act  Aers (Albuterol) .... Use Two Times A Day As Needed 3)  Metformin Hcl 500 Mg  Tabs (Metformin Hcl) .Marland Kitchen.. 1 Two Times A Day 4)  Singulair 10 Mg  Tabs (Montelukast Sodium) .... One Daily 5)  Aspir-Low 81 Mg  Tbec (Aspirin) .... One Daily 6)  Naproxen 500 Mg  Tabs (Naproxen) .Marland Kitchen.. 1 By Mouth Two Times A Day As Needed  Allergies (verified): No Known Drug Allergies  Past History:  Past Medical History: Reviewed history from 04/14/2007 and no changes required. Asthma COPD Hypertension blind Diabetes mellitus, type II Pancreatitis, hx of Pneumonia, hx of Seizure disorder Low back pain  Family History: Reviewed history from 08/17/2007 and no changes required. Family History of Cardiovascular disorder father died at 60 murdered mother died in her 80s also murdered  Four brothers one sister positive for cardiac disease  Social History: Reviewed history from 04/14/2007 and no changes required. Married Current  Smoker  Review of Systems       The patient complains of dyspnea on exertion and difficulty walking.  The patient denies anorexia, fever, weight loss, weight gain, vision loss, decreased hearing, hoarseness, chest pain, syncope, peripheral edema, prolonged cough, headaches, hemoptysis, abdominal pain, melena, hematochezia, severe indigestion/heartburn, hematuria, incontinence, genital sores, muscle weakness, suspicious skin lesions, transient blindness, depression, unusual weight change, abnormal bleeding, enlarged lymph nodes, angioedema, breast masses, and testicular masses.    Physical Exam  General:  Well-developed,well-nourished,in no acute distress; alert,appropriate and cooperative throughout examination; normal blood pressure Head:  Normocephalic and atraumatic without obvious abnormalities. No apparent alopecia or balding. Eyes:  No corneal or conjunctival inflammation noted. EOMI. Perrla. Funduscopic exam benign, without hemorrhages, exudates or papilledema. Vision grossly normal. Mouth:  Oral mucosa and oropharynx without lesions or exudates.  Teeth in good repair. Neck:  No deformities, masses, or tenderness noted. Lungs:  expiratory wheezing no increased work of breathing Heart:  Normal rate and regular rhythm. S1 and S2 normal without gallop, murmur, click, rub or other extra sounds. Abdomen:  Bowel sounds positive,abdomen soft and non-tender without masses, organomegaly or hernias noted. Pulses:  R and L carotid,radial,femoral,dorsalis pedis and posterior tibial pulses are full and equal bilaterally Extremities:  No clubbing, cyanosis, edema, or deformity noted with normal full range of motion of all joints.     Impression & Recommendations:  Problem # 1:  LEG PAIN, RIGHT (ICD-729.5)  Problem #  2:  DIABETES MELLITUS, TYPE II (ICD-250.00)  His updated medication list for this problem includes:    Metformin Hcl 500 Mg Tabs (Metformin hcl) .Marland Kitchen... 1 two times a day    Aspir-low  81 Mg Tbec (Aspirin) ..... One daily    His updated medication list for this problem includes:    Metformin Hcl 500 Mg Tabs (Metformin hcl) .Marland Kitchen... 1 two times a day    Aspir-low 81 Mg Tbec (Aspirin) ..... One daily  Problem # 3:  HYPERTENSION (ICD-401.9)  Problem # 4:  COPD (ICD-496)  His updated medication list for this problem includes:    Advair Diskus 250-50 Mcg/dose Misc (Fluticasone-salmeterol) ..... Use two times a day    Albuterol 90 Mcg/act Aers (Albuterol) ..... Use two times a day as needed    Singulair 10 Mg Tabs (Montelukast sodium) ..... One daily  His updated medication list for this problem includes:    Advair Diskus 250-50 Mcg/dose Misc (Fluticasone-salmeterol) ..... Use two times a day    Albuterol 90 Mcg/act Aers (Albuterol) ..... Use two times a day as needed    Singulair 10 Mg Tabs (Montelukast sodium) ..... One daily  Complete Medication List: 1)  Advair Diskus 250-50 Mcg/dose Misc (Fluticasone-salmeterol) .... Use two times a day 2)  Albuterol 90 Mcg/act Aers (Albuterol) .... Use two times a day as needed 3)  Metformin Hcl 500 Mg Tabs (Metformin hcl) .Marland Kitchen.. 1 two times a day 4)  Singulair 10 Mg Tabs (Montelukast sodium) .... One daily 5)  Aspir-low 81 Mg Tbec (Aspirin) .... One daily 6)  Naproxen 500 Mg Tabs (Naproxen) .Marland Kitchen.. 1 by mouth two times a day as needed 7)  Cyclobenzaprine Hcl 10 Mg Tabs (Cyclobenzaprine hcl) .... One at bedtime as needed for leg pain  Other Orders: Venipuncture (10272) TLB-A1C / Hgb A1C (Glycohemoglobin) (83036-A1C)  Patient Instructions: 1)  Please schedule a follow-up appointment in 3 months .  2)  Limit your Sodium(salt) .  3)  Tobacco is very bad for your health and your loved ones ! You should stop smoking !  4)  It is important that you exercise reguarly at least 20 minutes 5 times a week. If you develop chest pain, have severe difficulty breathing, or feel very tired, stop exercising immediately and seek medical attention.   5)  You need to lose weight. Consider a lower calorie diet and regular exercise.  6)  Check your blood sugars regularly. If your readings are usually above:  or below 70 you should contact our office.  7)  It is important that your diabetic A1c level is checked every 3 months.  8)  See your eye doctor yearly to check for diabetic eye damage. Prescriptions: CYCLOBENZAPRINE HCL 10 MG TABS (CYCLOBENZAPRINE HCL) one at bedtime as needed for leg pain  #30 x 4   Entered by:   Gordy Savers  MD   Authorized by:   Alfred Levins, CMA   Signed by:   Gordy Savers  MD on 06/20/2010   Method used:   Print then Give to Patient   RxID:   [id number]    Orders Added: 1)  Est. Patient Level IV [38756] 2)  Venipuncture [43329] 3)  TLB-A1C / Hgb A1C (Glycohemoglobin) [51884-Z6S]

## 2010-09-24 NOTE — Assessment & Plan Note (Signed)
Summary: 3 month fup//ccm   Vital Signs:  Patient profile:   65 year old male Weight:      194 pounds Temp:     98.4 degrees F oral BP sitting:   112 / 80  (right arm) Cuff size:   regular  Vitals Entered By: Duard Brady LPN (March 21, 2010 7:56 AM) CC: rov - doing ok Is Patient Diabetic? Yes Did you bring your meter with you today? No CBG Result 112   CC:  rov - doing ok.  History of Present Illness: 65 year old patient who is seen today for follow-up.  He has a history of type 2 diabetes.  This is controlled on metformin therapy.  A random blood sugar today 112.  He is having no recent hemoglobin A1 C's.  She has hypertension and COPD with asthma.  He denies any pulmonary complaints, is noncompliant today is stiffness of the legs.  It occurs at night.  He states his problem for two weeks.  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  Preventive Screening-Counseling & Management  Alcohol-Tobacco     Smoking Status: current  Allergies (verified): No Known Drug Allergies  Past History:  Past Medical History: Reviewed history from 04/14/2007 and no changes required. Asthma COPD Hypertension blind Diabetes mellitus, type II Pancreatitis, hx of Pneumonia, hx of Seizure disorder Low back pain  Family History: Reviewed history from 08/17/2007 and no changes required. Family History of Cardiovascular disorder father died at 52 murdered mother died in her 15s also murdered  Four brothers one sister positive for cardiac disease  Physical Exam  General:  Well-developed,well-nourished,in no acute distress; alert,appropriate and cooperative throughout examination Head:  Normocephalic and atraumatic without obvious abnormalities. No apparent alopecia or balding. Eyes:  No corneal or conjunctival inflammation noted. EOMI. Perrla. Funduscopic exam benign, without hemorrhages, exudates or papilledema. Vision grossly  normal. right slight ptosis Mouth:  Oral mucosa and oropharynx without lesions or exudates.  Teeth in good repair. Neck:  No deformities, masses, or tenderness noted. Lungs:  a few fine expiratory wheezing anteriorly Heart:  Normal rate and regular rhythm. S1 and S2 normal without gallop, murmur, click, rub or other extra sounds. Abdomen:  Bowel sounds positive,abdomen soft and non-tender without masses, organomegaly or hernias noted. Msk:  No deformity or scoliosis noted of thoracic or lumbar spine.   Pulses:  the right dorsalis pedis pulse after Extremities:  No clubbing, cyanosis, edema, or deformity noted with normal full range of motion of all joints.   Neurologic:  No cranial nerve deficits noted. Station and gait are normal. Plantar reflexes are down-going bilaterally. DTRs are symmetrical throughout. Sensory, motor and coordinative functions appear intact.  Diabetes Management Exam:    Foot Exam (with socks and/or shoes not present):       Sensory-Pinprick/Light touch:          Left medial foot (L-4): normal          Left dorsal foot (L-5): normal          Left lateral foot (S-1): normal          Right medial foot (L-4): normal          Right dorsal foot (L-5): normal          Right lateral foot (S-1): normal       Sensory-Monofilament:          Left foot: normal  Right foot: normal       Inspection:          Left foot: normal          Right foot: normal       Nails:          Left foot: thickened          Right foot: thickened    Foot Exam by Podiatrist:       Date: 03/21/2010       Results: no diabetic findings       Done by: PCP   Impression & Recommendations:  Problem # 1:  LEG PAIN, RIGHT (ICD-729.5)  Problem # 2:  DIABETES MELLITUS, TYPE II (ICD-250.00)  His updated medication list for this problem includes:    Metformin Hcl 500 Mg Tabs (Metformin hcl) .Marland Kitchen... 1 two times a day    Aspir-low 81 Mg Tbec (Aspirin) ..... One daily  Orders: Capillary Blood  Glucose/CBG (13086)  His updated medication list for this problem includes:    Metformin Hcl 500 Mg Tabs (Metformin hcl) .Marland Kitchen... 1 two times a day    Aspir-low 81 Mg Tbec (Aspirin) ..... One daily  Problem # 3:  HYPERTENSION (ICD-401.9)  Problem # 4:  COPD (ICD-496)  His updated medication list for this problem includes:    Advair Diskus 250-50 Mcg/dose Misc (Fluticasone-salmeterol) ..... Use two times a day    Albuterol 90 Mcg/act Aers (Albuterol) ..... Use two times a day as needed    Singulair 10 Mg Tabs (Montelukast sodium) ..... One daily    His updated medication list for this problem includes:    Advair Diskus 250-50 Mcg/dose Misc (Fluticasone-salmeterol) ..... Use two times a day    Albuterol 90 Mcg/act Aers (Albuterol) ..... Use two times a day as needed    Singulair 10 Mg Tabs (Montelukast sodium) ..... One daily  Complete Medication List: 1)  Advair Diskus 250-50 Mcg/dose Misc (Fluticasone-salmeterol) .... Use two times a day 2)  Albuterol 90 Mcg/act Aers (Albuterol) .... Use two times a day as needed 3)  Metformin Hcl 500 Mg Tabs (Metformin hcl) .Marland Kitchen.. 1 two times a day 4)  Singulair 10 Mg Tabs (Montelukast sodium) .... One daily 5)  Aspir-low 81 Mg Tbec (Aspirin) .... One daily 6)  Naproxen 500 Mg Tabs (Naproxen) .Marland Kitchen.. 1 by mouth two times a day as needed  Other Orders: [facility] (Vascular) Venipuncture (57846) TLB-Lipid Panel (80061-LIPID) TLB-BMP (Basic Metabolic Panel-BMET) (80048-METABOL) TLB-CBC Platelet - w/Differential (85025-CBCD) TLB-Hepatic/Liver Function Pnl (80076-HEPATIC) TLB-TSH (Thyroid Stimulating Hormone) (84443-TSH) TLB-A1C / Hgb A1C (Glycohemoglobin) (83036-A1C) TLB-PSA (Prostate Specific Antigen) (84153-PSA)  Patient Instructions: 1)  Please schedule a follow-up appointment in 3 months. 2)  Limit your Sodium (Salt). 3)  It is important that you exercise regularly at least 20 minutes 5 times a week. If you develop chest pain, have severe  difficulty breathing, or feel very tired , stop exercising immediately and seek medical attention. 4)  Check your blood sugars regularly. If your readings are usually above : or below 70 you should contact our office. 5)  It is important that your Diabetic A1c level is checked every 3 months. 6)  See your eye doctor yearly to check for diabetic eye damage. Prescriptions: NAPROXEN 500 MG  TABS (NAPROXEN) 1 by mouth two times a day as needed  #180 x 3   Entered and Authorized by:   Gordy Savers  MD   Signed by:   Gordy Savers  MD on 03/21/2010   Method used:   Electronically to        [facility] [phone]* (retail)       [address]       [place], [place]  [address]       Ph: [phone] or [phone]       Fax: [phone]   RxID:   [id number] SINGULAIR 10 MG  TABS (MONTELUKAST SODIUM) one daily  #90 Tablet x 5   Entered and Authorized by:   Gordy Savers  MD   Signed by:   Gordy Savers  MD on 03/21/2010   Method used:   Electronically to        [facility] [phone]* (retail)       [address] [place]  [address]       Ph: [phone] or [phone]       Fax: [phone]   RxID:   [id number] METFORMIN HCL 500 MG  TABS (METFORMIN HCL) 1 two times a day  #180 x 6   Entered and Authorized by:   Gordy Savers  MD   Signed by:   Gordy Savers  MD on 03/21/2010   Method used:   Electronically to        [facility] [phone]* (retail)       [address]       [place], [place]  [address]       Ph: [phone] or [phone]       Fax: [phone]   RxID:   [id number] ADVAIR DISKUS 250-50 MCG/DOSE  MISC (FLUTICASONE-SALMETEROL) use two times a day  #3 x 6   Entered and Authorized by:   Gordy Savers  MD   Signed by:   Gordy Savers  MD on 03/21/2010   Method used:   [facility]ronically to        [facility] [phone]* (retail)       [address], [place]  [address]       Ph:  [phone] or [phone]       Fax: [phone]   RxID:   [id number]   Appended Document: Orders Update    Clinical Lists Changes  Orders: Added new Service order of Specimen Handling (78588) - Signed

## 2010-09-26 NOTE — Progress Notes (Signed)
  Phone Note Outgoing Call   Call placed by: Gwenn T. Call placed to: Patient Details for Reason: A1C on 09-19-10 Summary of Call: Left a message for the patient to call back because I don't think he came to the lab for his A1C.  Follow-up for Phone Call        If patient doesn't get a ride he can't come back for A1C.

## 2010-09-26 NOTE — Assessment & Plan Note (Signed)
Summary: 3 month rov/njr   Vital Signs:  Patient profile:   65 year old male Weight:      194 pounds BMI:     28.75 O2 Sat:      90 % on Room air Temp:     98.0 degrees F oral Pulse rate:   71 / minute BP sitting:   150 / 90  (left arm) Cuff size:   regular  Vitals Entered By: Duard Brady LPN (September 19, 2010 7:58 AM)  O2 Flow:  Room air CC: 3 mos rov - doing ok Is Patient Diabetic? Yes Did you bring your meter with you today? No CBG Result 99   CC:  3 mos rov - doing ok.  History of Present Illness: 28 has a history of asthma and COPD the and has been on long-term maintenance medications and he states he has been compliant.  He complains of increasing shortness of breath and wheezing.  He continues to smoke two or 3 cigarettes per day.  Denies any cough.  No fever or chills. He has a history of type 2 diabetes, controlled on metformin therapy.  His fasting blood sugar this morning 99 he has a history of hypertension, presently off medication. Other complaints today include bilateral hip pain that interferes with his sleep.  He has been on naproxen.  Preventive Screening-Counseling & Management  Alcohol-Tobacco     Smoking Cessation Counseling: yes  Allergies (verified): No Known Drug Allergies  Past History:  Past Medical History: Asthma COPD Hypertension  Diabetes mellitus, type II Pancreatitis, hx of Pneumonia, hx of Seizure disorder Low back pain  Past Surgical History: Reviewed history from 11/17/2007 and no changes required. Flexible Sigmoidoscopy 2001  Family History: Reviewed history from 08/17/2007 and no changes required. Family History of Cardiovascular disorder father died at 100 murdered mother died in her 67s also murdered  Four brothers one sister positive for cardiac disease  Social History: Reviewed history from 04/14/2007 and no changes required. Married Current Smoker   Review of Systems       The patient complains of  dyspnea on exertion.  The patient denies anorexia, fever, weight loss, weight gain, vision loss, decreased hearing, hoarseness, chest pain, syncope, peripheral edema, prolonged cough, headaches, hemoptysis, abdominal pain, melena, hematochezia, severe indigestion/heartburn, hematuria, incontinence, genital sores, muscle weakness, suspicious skin lesions, transient blindness, difficulty walking, depression, unusual weight change, abnormal bleeding, enlarged lymph nodes, angioedema, breast masses, and testicular masses.    Physical Exam  General:  Well-developed,well-nourished,in no acute distress; alert,appropriate and cooperative throughout examination; repeat blood pressure 120/80 Head:  Normocephalic and atraumatic without obvious abnormalities. No apparent alopecia or balding. Ears:  External ear exam shows no significant lesions or deformities.  Otoscopic examination reveals clear canals, tympanic membranes are intact bilaterally without bulging, retraction, inflammation or discharge. Hearing is grossly normal bilaterally. Mouth:  Oral mucosa and oropharynx without lesions or exudates.  Teeth in good repair. Neck:  No deformities, masses, or tenderness noted. Lungs:  coarse rhonchi and wheezing diffusely repeat O2 saturation as high as 95%normal respiratory effort, no intercostal retractions, and no accessory muscle use.  normal respiratory effort, no intercostal retractions, and no accessory muscle use.   Heart:  Normal rate and regular rhythm. S1 and S2 normal without gallop, murmur, click, rub or other extra sounds. pulse rate 60 Abdomen:  Bowel sounds positive,abdomen soft and non-tender without masses, organomegaly or hernias noted. Msk:  No deformity or scoliosis noted of thoracic or lumbar spine.  Pulses:  R and L carotid,radial,femoral,dorsalis pedis and posterior tibial pulses are full and equal bilaterally Extremities:  No clubbing, cyanosis, edema, or deformity noted with normal full  range of motion of all joints.   Skin:  Intact without suspicious lesions or rashes  skin of the lower extremities, dry and flaky Cervical Nodes:  No lymphadenopathy noted   Impression & Recommendations:  Problem # 1:  ASTHMA (ICD-493.90)  His updated medication list for this problem includes:    Advair Diskus 250-50 Mcg/dose Misc (Fluticasone-salmeterol) ..... Use two times a day    Albuterol 90 Mcg/act Aers (Albuterol) ..... Use two times a day as needed    Singulair 10 Mg Tabs (Montelukast sodium) ..... One daily    Prednisone 10 Mg Tabs (Prednisone) ..... One twice daily  His updated medication list for this problem includes:    Advair Diskus 250-50 Mcg/dose Misc (Fluticasone-salmeterol) ..... Use two times a day    Albuterol 90 Mcg/act Aers (Albuterol) ..... Use two times a day as needed    Singulair 10 Mg Tabs (Montelukast sodium) ..... One daily    Prednisone 10 Mg Tabs (Prednisone) ..... One twice daily  Orders: Depo- Medrol 80mg  (J1040) Admin of Therapeutic Inj  intramuscular or subcutaneous (01093)  Problem # 2:  DIABETES MELLITUS, TYPE II (ICD-250.00)  His updated medication list for this problem includes:    Metformin Hcl 500 Mg Tabs (Metformin hcl) .Marland Kitchen... 1 two times a day    Aspir-low 81 Mg Tbec (Aspirin) ..... One daily  Orders: Capillary Blood Glucose/CBG (23557) Venipuncture (32202) TLB-A1C / Hgb A1C (Glycohemoglobin) (83036-A1C)  His updated medication list for this problem includes:    Metformin Hcl 500 Mg Tabs (Metformin hcl) .Marland Kitchen... 1 two times a day    Aspir-low 81 Mg Tbec (Aspirin) ..... One daily  Problem # 3:  HYPERTENSION (ICD-401.9) remained stable off medication  Complete Medication List: 1)  Advair Diskus 250-50 Mcg/dose Misc (Fluticasone-salmeterol) .... Use two times a day 2)  Albuterol 90 Mcg/act Aers (Albuterol) .... Use two times a day as needed 3)  Metformin Hcl 500 Mg Tabs (Metformin hcl) .Marland Kitchen.. 1 two times a day 4)  Singulair 10 Mg Tabs  (Montelukast sodium) .... One daily 5)  Aspir-low 81 Mg Tbec (Aspirin) .... One daily 6)  Naproxen 500 Mg Tabs (Naproxen) .Marland Kitchen.. 1 by mouth two times a day as needed 7)  Cyclobenzaprine Hcl 10 Mg Tabs (Cyclobenzaprine hcl) .... One at bedtime as needed for leg pain 8)  Prednisone 10 Mg Tabs (Prednisone) .... One twice daily  Patient Instructions: 1)  Please schedule a follow-up appointment in 3 months. 2)  Limit your Sodium (Salt). 3)  Tobacco is very bad for your health and your loved ones! You Should stop smoking!. 4)  It is important that you exercise regularly at least 20 minutes 5 times a week. If you develop chest pain, have severe difficulty breathing, or feel very tired , stop exercising immediately and seek medical attention. 5)  You need to lose weight. Consider a lower calorie diet and regular exercise.  6)  Check your blood sugars regularly. If your readings are usually above : or below 70 you should contact our office. 7)  It is important that your Diabetic A1c level is checked every 3 months. 8)  See your eye doctor yearly to check for diabetic eye damage. Prescriptions: PREDNISONE 10 MG TABS (PREDNISONE) one twice daily  #14 x 0   Entered and Authorized by:   Theron Arista  Lysle Dingwall  MD   Signed by:   Gordy Savers  MD on 09/19/2010   Method used:   Print then Give to Patient   RxID:   [id number] CYCLOBENZAPRINE HCL 10 MG TABS (CYCLOBENZAPRINE HCL) one at bedtime as needed for leg pain  #90 x 6   Entered and Authorized by:   Gordy Savers  MD   Signed by:   Gordy Savers  MD on 09/19/2010   Method used:   Print then Give to Patient   RxID:   [id number] NAPROXEN 500 MG  TABS (NAPROXEN) 1 by mouth two times a day as needed  #180 x 3   Entered and Authorized by:   Gordy Savers  MD   Signed by:   Gordy Savers  MD on 09/19/2010   Method used:   Print then Give to Patient   RxID:   [id number] SINGULAIR 10 MG  TABS (MONTELUKAST  SODIUM) one daily  #90 Tablet x 6   Entered and Authorized by:   Gordy Savers  MD   Signed by:   Gordy Savers  MD on 09/19/2010   Method used:   Print then Give to Patient   RxID:   [id number] METFORMIN HCL 500 MG  TABS (METFORMIN HCL) 1 two times a day  #180 x 6   Entered and Authorized by:   Gordy Savers  MD   Signed by:   Gordy Savers  MD on 09/19/2010   Method used:   Print then Give to Patient   RxID:   [id number] ALBUTEROL 90 MCG/ACT  AERS (ALBUTEROL) use two times a day as needed  #3 x 6   Entered and Authorized by:   Gordy Savers  MD   Signed by:   Gordy Savers  MD on 09/19/2010   Method used:   Print then Give to Patient   RxID:   [id number] ADVAIR DISKUS 250-50 MCG/DOSE  MISC (FLUTICASONE-SALMETEROL) use two times a day  #3 x 6   Entered and Authorized by:   Gordy Savers  MD   Signed by:   Gordy Savers  MD on 09/19/2010   Method used:   Print then Give to Patient   RxID:   [id number]    Medication Administration  Injection # 1:    Medication: Depo- Medrol 80mg     Diagnosis: ASTHMA (ICD-493.90)    Route: IM    Site: R deltoid    Exp Date: 02/2013    Lot #: [id number]    Mfr: Pharmacia    Patient tolerated injection without complications    Given by: Duard Brady LPN (September 19, 2010 12:01 PM)  Orders Added: 1)  Capillary Blood Glucose/CBG [82948] 2)  Est. Patient Level IV [62831] 3)  Venipuncture [36415] 4)  TLB-A1C / Hgb A1C (Glycohemoglobin) [83036-A1C] 5)  Depo- Medrol 80mg  [J1040] 6)  Admin of Therapeutic Inj  intramuscular or subcutaneous [51761]

## 2010-12-08 ENCOUNTER — Other Ambulatory Visit: Payer: Self-pay | Admitting: Internal Medicine

## 2010-12-09 NOTE — Telephone Encounter (Signed)
Please advise 

## 2010-12-18 ENCOUNTER — Encounter: Payer: Self-pay | Admitting: Internal Medicine

## 2010-12-20 ENCOUNTER — Other Ambulatory Visit: Payer: Self-pay | Admitting: Internal Medicine

## 2010-12-20 ENCOUNTER — Encounter: Payer: Self-pay | Admitting: Internal Medicine

## 2010-12-20 ENCOUNTER — Ambulatory Visit (INDEPENDENT_AMBULATORY_CARE_PROVIDER_SITE_OTHER): Payer: Medicaid Other | Admitting: Internal Medicine

## 2010-12-20 DIAGNOSIS — I10 Essential (primary) hypertension: Secondary | ICD-10-CM

## 2010-12-20 DIAGNOSIS — J45909 Unspecified asthma, uncomplicated: Secondary | ICD-10-CM

## 2010-12-20 DIAGNOSIS — E119 Type 2 diabetes mellitus without complications: Secondary | ICD-10-CM

## 2010-12-20 LAB — HEMOGLOBIN A1C: Hgb A1c MFr Bld: 6.9 % — ABNORMAL HIGH (ref 4.6–6.5)

## 2010-12-20 MED ORDER — METFORMIN HCL 500 MG PO TABS: 500.0000 mg | ORAL_TABLET | Freq: Two times a day (BID) | ORAL | Status: DC

## 2010-12-20 MED ORDER — ALBUTEROL 90 MCG/ACT IN AERS: 2.0000 | INHALATION_SPRAY | Freq: Two times a day (BID) | RESPIRATORY_TRACT | Status: DC | PRN

## 2010-12-20 MED ORDER — MONTELUKAST SODIUM 10 MG PO TABS: 10.0000 mg | ORAL_TABLET | Freq: Every day | ORAL | Status: DC

## 2010-12-20 MED ORDER — PREDNISONE 10 MG PO TABS: 10.0000 mg | ORAL_TABLET | Freq: Two times a day (BID) | ORAL | Status: DC

## 2010-12-20 MED ORDER — FLUTICASONE-SALMETEROL 250-50 MCG/DOSE IN AEPB: 1.0000 | INHALATION_SPRAY | Freq: Two times a day (BID) | RESPIRATORY_TRACT | Status: DC

## 2010-12-20 NOTE — Progress Notes (Signed)
  Subjective:    Patient ID: Luis Carter, male    DOB: [birth year], 65 y.o.   MRN: [id number]  HPI Is a 65 year old patient who has a history of COPD and asthma he has had increasing wheezing over the past several days. He has a history of type 2 diabetes which has been well controlled. There has been no hemoglobin A1c since July but he is consistently low 6s. History of hypertension which has been stable. No new concerns or complaints today other than his increasing wheezing he is on Advair Ventolin and Singulair. He has required therapy with prednisone. Her fasting blood sugar today 144   Review of Systems  Constitutional: Negative for fever, chills, appetite change and fatigue.  HENT: Negative for hearing loss, ear pain, congestion, sore throat, trouble swallowing, neck stiffness, dental problem, voice change and tinnitus.   Eyes: Negative for pain, discharge and visual disturbance.  Respiratory: Positive for cough, chest tightness and wheezing. Negative for stridor.   Cardiovascular: Negative for chest pain, palpitations and leg swelling.  Gastrointestinal: Negative for nausea, vomiting, abdominal pain, diarrhea, constipation, blood in stool and abdominal distention.  Genitourinary: Negative for urgency, hematuria, flank pain, discharge, difficulty urinating and genital sores.  Musculoskeletal: Negative for myalgias, back pain, joint swelling, arthralgias and gait problem.  Skin: Negative for rash.  Neurological: Negative for dizziness, syncope, speech difficulty, weakness, numbness and headaches.  Hematological: Negative for adenopathy. Does not bruise/bleed easily.  Psychiatric/Behavioral: Negative for behavioral problems and dysphoric mood. The patient is not nervous/anxious.        Objective:   Physical Exam  Constitutional: He is oriented to person, place, and time. He appears well-developed.  HENT:  Head: Normocephalic.  Right Ear: External ear normal.  Left Ear: External  ear normal.  Eyes: Conjunctivae and EOM are normal.  Neck: Normal range of motion.  Cardiovascular: Normal rate and normal heart sounds.   Pulmonary/Chest: Effort normal. No respiratory distress. He has wheezes. He has no rales. He exhibits no tenderness.  Abdominal: Bowel sounds are normal.  Musculoskeletal: Normal range of motion. He exhibits no edema and no tenderness.  Neurological: He is alert and oriented to person, place, and time.  Psychiatric: He has a normal mood and affect. His behavior is normal.          Assessment & Plan:  Asthma. Will treat with 7 days of oral prednisone. We'll continue on inhalational medications Hypertension well controlled. All medications refilled Type 2 diabetes. We'll check a hemoglobin A1c  We'll recheck in 3 months

## 2010-12-20 NOTE — Patient Instructions (Signed)
Limit your sodium (Salt) intake    It is important that you exercise regularly, at least 20 minutes 3 to 4 times per week.  If you develop chest pain or shortness of breath seek  medical attention.   Please check your hemoglobin A1c every 3 months  Call or return to clinic prn if these symptoms worsen or fail to improve as anticipated.
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
## 2011-01-10 NOTE — Assessment & Plan Note (Signed)
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  He has  history of COPD, ongoing intermittent tobacco use, hypertension, and  diabetes.  Complaints today include some right upper leg pain for  approximately 2 weeks.  He is basically off all his medications.  He has  been hospitalized in the past for pancreatitis, pneumonia, and decompensated  COPD.  He was also evaluated in 2001 for chest pain.  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  He states that he has  stopped smoking again approximately 2 weeks ago.  There is also a remote  history of a seizure disorder, thought related to alcohol withdrawal.  At  the present time, he is taking no medications.  He continues to smoke  sporadically.   REVIEW OF SYSTEMS:  Otherwise noncontributory.  Pulmonary status seems  fairly stable.  His last sigmoidoscopy was in 2001.   FAMILY HISTORY:  Father died at 63 and mother in her 44s, both apparently  were murder victims, 4 brothers, 2 deceased from cardiac etiology.  One  sister remains well.   PHYSICAL EXAMINATION:  GENERAL:  Well-developed, mildly overweight black  male.  No acute distress.  Blood pressure is 120/80.  FUNDI/EAR/NOSE/THROAT:  Unremarkable.  Did have some mild right-sided  ptosis.  ENT:  Otherwise unremarkable.  NECK:  No bruits.  CHEST:  Some scattered rhonchi posteriorly and some faint expiratory wheezes  anteriorly.  CARDIOVASCULAR:  Slow, regular rhythm without murmur.  ABDOMEN:  Benign.  No organomegaly.  EXTERNAL GENITALIA:  Negative.  No hernia.  RECTAL EXAM:  Preoperative +2 and benign, stool heme-negative.  EXTREMITIES:  Intact peripheral pulses except for the right dorsalis  pedis  pulse that was not easily palpable.  MUSCULOSKELETAL:  Negative straight leg test.  Motor exam was unremarkable,  was able to flex and extend his foot without difficulty.   IMPRESSION:  1. Chronic obstructive pulmonary disease.  2. Ongoing tobacco use.  3. History of hypertension.  4. Impaired glucose tolerance.  5. History of chronic low back pain, presently with right leg discomfort.   DISPOSITION:  Compliance issues stressed.  He resumed on Singulair and  Advair as well as p.r.n. albuterol.  For his leg pain, he will be treated  with Naproxen and Flexeril.  Pending his  clinical response, we will consider further evaluation.  Total cessation of  tobacco encouraged.  Laboratory panel reviewed.            ______________________________  Gordy Savers, MD     PFK/MedQ  DD:  06/03/2006  DT:  06/04/2006  Job #:  [id number]
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
## 2011-01-10 NOTE — Discharge Summary (Signed)
Luis Carter, Luis Carter            ACCOUNT NO.:  [id number]   MEDICAL RECORD NO.:  [id number]          PATIENT TYPE:  INP   LOCATION:  6715                         FACILITY:  [facility]   PHYSICIAN:  Leighton Roach McDiarmid, M.D.DATE OF BIRTH:  11/17/1944   DATE OF ADMISSION:  11/21/2005  DATE OF DISCHARGE:  12/01/2005                                 DISCHARGE SUMMARY   DISCHARGE DIAGNOSES:  1.  Acute pancreatitis.  2.  Hypertension.  3.  Chronic obstructive pulmonary disease and asthma.  4.  Concern for hyperglycemia.  5.  Constipation.  6.  Anemia.  7.  Trichomonas.   DISCHARGE MEDICATIONS:  1.  Albuterol inhaler two puffs as needed for shortness of breath.  2.  Hydrochlorothiazide 25 mg take once daily for blood pressure.  3.  Lisinopril 10 mg take once daily for blood pressure.  4.  Singulair 10 mg take once daily for asthma.  5.  Advair 250/50 mcg one puff twice daily for asthma.  6.  Combivent inhaler two puffs three times a day for asthma.  7.  Oxycodone 10 mg take every six hours as needed for pain.  8.  Dulcolax suppositories 10 mg, place in rectum for a bowel movement while      on pain medications.   FOLLOW-UP:  The patient is to follow up with [facility], Dr. Amador Cunas, phone number [phone].  Appointment is on December 04, 2005, at 10 a.m.   HISTORY OF PRESENT ILLNESS:  The patient is a 65 year old African-American  male who presented to the [facility] ED with acute abdominal pain, history  of asthma, COPD.  The pain was described as a three-day history with severe  stabbing pain in the middle part of his abdomen radiating to both sides.  He  complained of two days of nausea and vomiting with two episodes of dark  emesis resembling coffee grounds.  The patient was noted to have a  urinalysis that showed 500 glucose, small bilirubin, trace ketones, trace  hemoglobin, Trichomonas.  Lipase was 599.  White blood cell count was  25,000.  Hemoglobin was  16.9 on admission, platelet count of 251.  CT of the  abdomen showed acute pancreatitis without abscess or pseudocyst, fluid noted  in the pelvis, no bowel obstruction or free air, and chest x-ray showed some  asthma/COPD changes.  The patient was admitted for further evaluation and  treatment.   BRIEF HOSPITAL COURSE:  Problem 1.  ACUTE PANCREATITIS:  The patient was admitted and placed n.p.o.,  was given IV fluids and pain control management with a morphine PCA.  The  patient had developed improvement and then worsening of abdominal pain with  eating and was noted to have a repeat CT scan on November 26, 2005, which showed  no change in the acute pancreatitis.  There was no evidence for a pseudocyst  or new formation of pancreatic necrosis.  There were new small bilateral  effusions and some stable free pelvic fluid.  A KUB of the stomach showed  constipation.  This was felt to be secondary to his  morphine use.  The  patient was instructed on the use of a PCA pump and eventually became pain-  free, able to tolerate a diabetic diet without pain, and was having bowel  movements.  The patient was tolerating oxycodone 10 mg p.o. q.6h. for pain  on day of discharge.  The patient will be discharged with follow-up with his  primary care physician back at [facility] [place] on December 04, 2005.  He will be given enough oxycodone to tide him over in the  interim, and the patient is tolerating a diet well.   Problem 2.  HYPERTENSION:  The patient was noted to have blood pressures  elevated into the systolics of 220s-190s.  The patient was placed on Norvasc  and hydrochlorothiazide while as an inpatient; however, it was noted that  the patient does have a positive history of cocaine usage.  At one point  during the admission the patient was unable to tolerate p.o. so he was  switched to clonidine 0.1 mg patch.  The patient's blood pressure was stable  on this medicine; however, it was  thought that the patient could obtain more  benefit from other blood pressure medicines once he was able to tolerate  p.o.  Once the patient was able to tolerate p.o., he was switched to  hydrochlorothiazide 25 mg as well as lisinopril 10 mg for his blood pressure  control.  This can be followed up as an outpatient.   Problem 3.  CHRONIC OBSTRUCTIVE PULMONARY DISEASE AND ASTHMA:  The patient  was treated with IV steroids, Solu-Medrol 60 mg IV x6 days.  He had noted  bilateral wheezing on lung exam.  Primary M.D. was contacted.  He was  originally on a home regimen of Advair 250/50 mcg one puff b.i.d., Singulair  and albuterol on a p.r.n. basis.  The patient had received a six-day course  of steroids and is doing well with minimal bilateral wheezing.  He was  weaned off his nebulizer treatments to q.8h.  The patient will be sent home  on Combivent inhaler two puffs three times a day, albuterol as needed,  Advair 250/50 mcg one puff b.i.d.  The patient will need a follow-up with  his home primary care doctor.   Problem 4.  HYPERGLYCEMIA:  The patient was noted to have elevated blood  sugars ranging from 200s-400s while an inpatient.  It was noted that the  patient had 500 of glucose in his urine on admission; however, his  hemoglobin A1c showed to be checked on April 2 to be 6.5.  The patient was  started on steroids during this hospitalization, which is known to elevate  his blood sugars.  The patient was treated with sliding scale insulin as  well as some Lantus to control his blood sugars.  The patient received  diabetic education while in the hospital.  It was felt that this may be  secondary to his steroid usage.  This will need to be followed up by his  primary M.D.  The patient will need a fasting blood sugar as an outpatient.  The patient will not be sent home on any diabetic medication at this time secondary to the thought that this was likely due to his acute pancreatitis,  which  was secondary to alcoholism, and his steroid usage.  The patient may  be a candidate for metformin to be discussed in outpatient follow-up.   Problem 5.  CONSTIPATION:  The patient suffered from  constipation secondary  to his morphine opiate use during the hospitalization.  The patient was  given Dulcolax suppositories and had bowel movement on the day of discharge.   Problem 6.  ANEMIA:  The patient was noted to have a hemoglobin between 13  and 11.  This hospitalization he was stable.  The patient will need to be  followed up for anemia.  This could be secondary to chronic disease or  alcoholism with note of his MCV being, however, 85.  The patient will need  follow-up further as an outpatient.   Problem 7.  TRICHOMONAS:  The patient was noted to have Trichomonas on  admission on a urinalysis.  The patient will be given Flagyl 2 g p.o. prior  to discharge.   SIGNIFICANT LABS:  Basic metabolic profile on the day of discharge, December 01, 2005:  Sodium 134, potassium 4.1, chloride 105, bicarb 24, glucose 149, BUN  12, creatinine 1.1, calcium 7.6.  CBC was noted to be white blood cell count  of 12, hemoglobin 11.4, hematocrit 33.7, platelet count of 299.  Complete  metabolic profile obtained on April 3 showed liver function, his total  bilirubin 2.7, alkaline phosphatase 58, AST 22, ALT 15, total protein 5.9,  albumin was 2.4.  Calcium was 8.2.  Lipid profile obtained on November 24, 2005,  showed cholesterol of 128, triglycerides of 84, HDL of 37 and LDL of 74.  Urine drug screen was positive for cocaine.  LDH was noted to be 317 on  admission.  FOBT was negative.  Ethyl alcohol level was less than 5.  Urine  culture was 30,000 colonies of multiple bacterial morphologies.   CONSULTATIONS:  There were none.   PROCEDURES:  There were two abdominal CT scans obtained during this  admission, both showing acute pancreatitis.   FOLLOW-UP:  The patient has a follow-up with Dr. Amador Cunas on  December 04, 2005, with [facility] [place].      Barth Kirks, M.D.    ______________________________  Leighton Roach McDiarmid, M.D.    MB/MEDQ  D:  12/01/2005  T:  12/02/2005  Job:  [id number]   cc:   Gordy Savers, M.D. [facility]  [address] [place]  [place] [address]

## 2011-01-10 NOTE — H&P (Signed)
[facility]  Patient:    Luis Carter, Luis Carter Visit Number: [id number] MRN: [id number]          Service Type: MED Location: 3W [id number] 01 Attending Physician:  Cathren Laine Dictated by:   Tyson Dense, M.D. Admit Date:  10/03/2001                           History and Physical  CHIEF COMPLAINT:  Shortness of breath, fever, cough, wheezing.  HISTORY OF PRESENT ILLNESS:  The patient is a 65 year old African-American male with history of asthma, presented with one-week history of increasing wheezing, shortness of breath, cold in the chest, productive sputum, started with URI symptoms.  He was seen in the emergency room about two days ago.  Got some nebulizer treatment and put on Zithromax and p.o. prednisone.  Was sent home, and came back today with worsening of wheezing, shortness of breath, cough, and he has had one time last week some vomiting and diarrhea, and that resolved.  Low-grade temperature, headache, feeling tightness in the chest and productive cough.  He was tachypneic, and room air saturations were 82% on arrival, and febrile to 100.4.  PAST MEDICAL HISTORY:  According to his sister he had a slight MI about two years ago.  He was followed at that time by [facility].  According to his records he had an EF of 50% by nuclear Cardiolite test.  History of asthma since childhood.  He was hospitalized one or two times with asthma exacerbation.  He also had history of pneumonia 10 years ago and was hospitalized.  History of hypertension.  No medication.  History of gout.  PAST SURGICAL HISTORY:  None.  MEDICATIONS:  He takes Primatene over-the-counter MDI, and he was put on Zithromax two days ago and some prednisone.  He was not sure he was taking those.  ALLERGIES:  None.  SOCIAL HISTORY:  He quit smoking about two weeks ago.  Smoked about one pack a day for 30 years.  ETOH, quit in 1996.  He has one son and sister and two brothers.   He is on disability because of his asthma and gout.  FAMILY HISTORY:  Mother and father both were murdered.  One sister and two brothers positive for diabetes and heart disease.  REVIEW OF SYSTEMS:  As mentioned above.  PHYSICAL EXAMINATION:  VITAL SIGNS:  Temperature 100.3, pulse of 90-100 sinus, respirations 20-24, blood pressure 103/60, 96%.  GENERAL:  Male in mild distress, feeling cold.  HEENT:  Pupils equal and reactive.  Sclerae muddy.  Oropharynx clear.  NECK:  Supple.  CARDIAC:  Regular rhythm, S1, S2, without any murmurs.  LUNGS:  Diffuse expiratory wheezing and rhonchi.  ABDOMEN:  Soft.  Positive bowel sounds.  Without any distention.  EXTREMITIES:  No edema.  NEUROLOGIC:  Nonfocal.  LABORATORY DATA:  White count 9.5, hemoglobin 13, with left shift. Electrolytes:  Sodium 133, potassium 4.5, BUN 22, creatinine 1.5, glucose 24. LFTs normal.  ETOH level was less than 5.  Tox screen was negative.  Blood culture was sent.  Sputum culture unremarkable.  ABG on 4 L was done with 7.6 pH, pCO2 of 23, pO2 of 100.  Bicarbonate 22.  Chest x-ray shows no infiltrates.  EKG:  Sinus tachycardia, without any ST and T-wave changes.  IMPRESSION:  The patient is a 65 year old African-American male with history of asthma, smoking.  Comes in with increasing wheezing, shortness of breath, fever, and  cough.  ASSESSMENT:  Asthmatic bronchitis and chronic obstructive pulmonary disease. Rule out pneumonia.  PLAN:  Telemetry with albuterol and Atrovent nebulizer.  Tequin IV 400 mg q.d. Solu-Medrol.  The hospital does not have Solu-Medrol, so will put him on Decadron equal in dose 12 mg every six hours.  IV fluids.  As far as urine, showed positive leukocytes and some Trichomonas.  I will repeat the urine again.  If it is still positive, might need to treat him with Flagyl.Dictated by:   Tyson Dense, M.D. Attending Physician:  Cathren Laine DD:  10/04/01 TD:  10/04/01 Job:  [id number]

## 2011-01-10 NOTE — Discharge Summary (Signed)
[facility]  Patient:    Luis Carter, Luis Carter Visit Number: [id number] MRN: [id number]          Service Type: MED Location: 3W 0357 01 Attending Physician:  Cathren Laine Dictated by:   Sonda Primes, M.D. [facility] Proc. Date: 10/09/01 Admit Date:  10/03/2001 Disc. Date: 10/09/01                             Discharge Summary  DATE OF BIRTH:  December 17, 1943  FINAL DIAGNOSES: 1. Bronchitis with asthma/chronic obstructive pulmonary disease exacerbation. 2. 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 3. History of smoking. 4. History of alcohol abuse. 5. History of gout. 6. History of asthma.  HISTORY:  The patient is a 65 year old male, who presents with a one-week history of increasing wheezing and shortness of breath.  He was febrile on admission.  For further details, please address the history and physical on October 04, 2001.  DISCHARGE MEDICATIONS: 1. Combivent 2 puffs q.4h. while awake. 2. ______ 1 b.i.d. x 10 days. 3. Tequin 400 mg 1 q.d. x 2 days. 4. Glucophage 500 mg 1 at night x 5 days. 5. Prednisone 10 mg 1 q.d. with food x 5 days. 6. Tussionex 5 mL b.i.d. p.r.n. cough.  ACTIVITY:  As tolerated.  DIET:  Diabetic.  SPECIAL INSTRUCTIONS:  Call if problems.  FOLLOW-UP PLANS: 1. See your medical doctor next week (he has name of his primary care    physician at home). 2. He will attend outpatient diabetic classes at [facility] on    October 11, 2001.  HOSPITAL COURSE:  The patient was admitted with the above symptoms.  During the course of hospitalization, he was treated with hand-held nebulizer of albuterol and Atrovent, IV steroids, and IV antibiotics.  His condition has been improving slowly.  He developed severe steroid-induced hyperglycemia with a blood sugar of over 700.  He was treated with inpatient sliding scaling Humalog, and he was started on Glucophage.  With a decrease of the steroid dose, his sugars came  down.  PHYSICAL EXAMINATION:  GENERAL:  On the day of discharge, he continues to feel tired.  He is not short of breath.  No chest pain.  No nausea or vomiting.  VITAL SIGNS:  His temp is 97.5, heart rate 65, respirations 20, blood pressure 106/58.  Blood sugar is [id number].  Oxygen saturations 97% on room.  HEENT:  Moist mucosa.  LUNGS:  Bilateral mild rhonchi.  HEART:  Regular S1, S2.  ABDOMEN:  Soft and tender.  LOWER EXTREMITIES:  Without edema.  Calves nontender.  NEUROLOGIC:  He is alert, oriented, and cooperative.  LABORATORY DATA:  Chest x-ray with COPD-consistent changes.  No pneumonia. EKG with normal sinus rhythm.  Hemoglobin A1c 6.4, glucose 751 on October 06, 2001.  Blood gas originally with pH 7.61, pCO2 23, pO2 100% on 4 liters of oxygen.  White count 11.7, hemoglobin 12.5, potassium 4.5, albumin 3.7.  Liver tests normal.  Blood cultures negative.Dictated by:   Sonda Primes, M.D. [facility] Attending Physician:  Cathren Laine DD:  10/09/01 TD:  10/09/01 Job: [id number]

## 2011-01-27 ENCOUNTER — Other Ambulatory Visit: Payer: Self-pay | Admitting: Internal Medicine

## 2011-03-21 ENCOUNTER — Ambulatory Visit: Payer: Medicaid Other | Admitting: Internal Medicine

## 2011-04-08 ENCOUNTER — Ambulatory Visit
Admission: RE | Admit: 2011-04-08 | Discharge: 2011-04-08 | Disposition: A | Discharge status: Home / Self Care | Payer: Medicare Other | Source: Ambulatory Visit | Attending: Orthopedic Surgery | Admitting: Orthopedic Surgery

## 2011-04-08 ENCOUNTER — Other Ambulatory Visit: Payer: Self-pay | Admitting: Orthopedic Surgery

## 2011-04-10 ENCOUNTER — Encounter: Payer: Self-pay | Admitting: Internal Medicine

## 2011-04-10 ENCOUNTER — Ambulatory Visit (INDEPENDENT_AMBULATORY_CARE_PROVIDER_SITE_OTHER): Payer: Medicare Other | Admitting: Internal Medicine

## 2011-04-10 DIAGNOSIS — M79609 Pain in unspecified limb: Secondary | ICD-10-CM

## 2011-04-10 DIAGNOSIS — R569 Unspecified convulsions: Secondary | ICD-10-CM

## 2011-04-10 DIAGNOSIS — I10 Essential (primary) hypertension: Secondary | ICD-10-CM

## 2011-04-10 DIAGNOSIS — J45909 Unspecified asthma, uncomplicated: Secondary | ICD-10-CM

## 2011-04-10 DIAGNOSIS — M545 Low back pain: Secondary | ICD-10-CM

## 2011-04-10 DIAGNOSIS — E119 Type 2 diabetes mellitus without complications: Secondary | ICD-10-CM

## 2011-04-10 LAB — GLUCOSE, POCT (MANUAL RESULT ENTRY): POC Glucose: 123

## 2011-04-10 MED ORDER — HYDROXYZINE HCL 50 MG PO TABS: 50.0000 mg | ORAL_TABLET | Freq: Three times a day (TID) | ORAL | Status: AC | PRN

## 2011-04-10 MED ORDER — TRIAMCINOLONE ACETONIDE 0.1 % EX CREA: TOPICAL_CREAM | Freq: Two times a day (BID) | CUTANEOUS | Status: DC

## 2011-04-10 NOTE — Patient Instructions (Signed)
Please check your hemoglobin A1c every 3 months    It is important that you exercise regularly, at least 20 minutes 3 to 4 times per week.  If you develop chest pain or shortness of breath seek  medical attention.   

## 2011-04-10 NOTE — Progress Notes (Signed)
  Subjective:    Patient ID: Luis Carter, male    DOB: [birth year], 65 y.o.   MRN: [id number]  HPI  65 year old patient has a history of hypertension and type 2 diabetes. Fasting blood sugar 124. He also has a history of COPD and asthma. Apparently he has been taking prednisone on a fairly regular basis usually at bedtime and often 2 or 3 times a day for breathing issues. He is also on maintenance Advair and uses Proventil when necessary. His diabetes appears stable he complains of some left leg pain and also itching involving the feet and ankle areas. 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  His last hemoglobin A1c was 6.9    Review of Systems  Constitutional: Negative for fever, chills, appetite change and fatigue.  HENT: Negative for hearing loss, ear pain, congestion, sore throat, trouble swallowing, neck stiffness, dental problem, voice change and tinnitus.   Eyes: Negative for pain, discharge and visual disturbance.  Respiratory: Negative for cough, chest tightness, wheezing and stridor.   Cardiovascular: Negative for chest pain, palpitations and leg swelling.  Gastrointestinal: Negative for nausea, vomiting, abdominal pain, diarrhea, constipation, blood in stool and abdominal distention.  Genitourinary: Negative for urgency, hematuria, flank pain, discharge, difficulty urinating and genital sores.  Musculoskeletal: Negative for myalgias, back pain, joint swelling, arthralgias and gait problem.  Skin: Positive for rash.  Neurological: Negative for dizziness, syncope, speech difficulty, weakness, numbness and headaches.  Hematological: Negative for adenopathy. Does not bruise/bleed easily.  Psychiatric/Behavioral: Negative for behavioral problems and dysphoric mood. The patient is not nervous/anxious.        Objective:   Physical Exam  Constitutional: He is oriented to person, place, and time. He appears well-developed.  HENT:  Head:  Normocephalic.  Right Ear: External ear normal.  Left Ear: External ear normal.  Eyes: Conjunctivae and EOM are normal.  Neck: Normal range of motion.  Cardiovascular: Normal rate and normal heart sounds.   Pulmonary/Chest: Breath sounds normal.       Very faint expiratory wheezing anteriorly. In no respiratory distress  Abdominal: Bowel sounds are normal.  Musculoskeletal: Normal range of motion. He exhibits no edema and no tenderness.  Neurological: He is alert and oriented to person, place, and time.  Skin:       Examination lower extremities revealed some hyperpigmented changes to the skin was not particularly dry. He has been using topical lotion pedal pulses were intact  Psychiatric: He has a normal mood and affect. His behavior is normal.          Assessment & Plan:   Diabetes mellitus. We'll check a hemoglobin A1c Asthma. Prednisone therapy will be discontinued this may be required in the future when necessary we'll continue maintenance Advair and when necessary albuterol Hypertension stable  Recheck 3 months

## 2011-05-14 ENCOUNTER — Other Ambulatory Visit: Payer: Self-pay | Admitting: Internal Medicine

## 2011-06-06 LAB — POCT URINALYSIS DIP (DEVICE)
Bilirubin Urine: NEGATIVE
Hgb urine dipstick: NEGATIVE
Ketones, ur: NEGATIVE
Specific Gravity, Urine: 1.015
pH: 7

## 2011-06-23 ENCOUNTER — Telehealth: Payer: Self-pay | Admitting: Internal Medicine

## 2011-06-23 NOTE — Telephone Encounter (Signed)
Caller called back again. Patient has Copd and needs his own nebulizer, meds and instructions. He also needs podiatry referral  and diabetes education. Needs to be checked for dementia. Wants nurse to call patient.

## 2011-06-23 NOTE — Telephone Encounter (Signed)
Pt is having high blood pressure today 170/112. This is a Dr K patient but is hoping to get direction on what to do. Pt requesting to be contacted

## 2011-06-23 NOTE — Telephone Encounter (Signed)
Chart opened in error

## 2011-06-23 NOTE — Telephone Encounter (Signed)
MULTIPLE issues per phone note.  Needs office assessment to address.

## 2011-06-24 NOTE — Telephone Encounter (Signed)
Pt has appt in November and prefers to wait until then to address issues.

## 2011-07-11 ENCOUNTER — Telehealth: Payer: Self-pay | Admitting: Internal Medicine

## 2011-07-11 ENCOUNTER — Ambulatory Visit (INDEPENDENT_AMBULATORY_CARE_PROVIDER_SITE_OTHER): Payer: Medicare Other | Admitting: Internal Medicine

## 2011-07-11 ENCOUNTER — Encounter: Payer: Self-pay | Admitting: Internal Medicine

## 2011-07-11 DIAGNOSIS — J45909 Unspecified asthma, uncomplicated: Secondary | ICD-10-CM

## 2011-07-11 DIAGNOSIS — E119 Type 2 diabetes mellitus without complications: Secondary | ICD-10-CM

## 2011-07-11 DIAGNOSIS — J449 Chronic obstructive pulmonary disease, unspecified: Secondary | ICD-10-CM

## 2011-07-11 DIAGNOSIS — I10 Essential (primary) hypertension: Secondary | ICD-10-CM

## 2011-07-11 MED ORDER — TRIAMCINOLONE ACETONIDE 0.1 % EX CREA: TOPICAL_CREAM | Freq: Two times a day (BID) | CUTANEOUS | Status: AC

## 2011-07-11 MED ORDER — FLUTICASONE-SALMETEROL 250-50 MCG/DOSE IN AEPB: 1.0000 | INHALATION_SPRAY | Freq: Two times a day (BID) | RESPIRATORY_TRACT | Status: DC

## 2011-07-11 MED ORDER — CYCLOBENZAPRINE HCL 10 MG PO TABS: 10.0000 mg | ORAL_TABLET | Freq: Every day | ORAL | Status: DC

## 2011-07-11 MED ORDER — METFORMIN HCL 500 MG PO TABS: 500.0000 mg | ORAL_TABLET | Freq: Two times a day (BID) | ORAL | Status: DC

## 2011-07-11 MED ORDER — MONTELUKAST SODIUM 10 MG PO TABS: 10.0000 mg | ORAL_TABLET | Freq: Every day | ORAL | Status: DC

## 2011-07-11 MED ORDER — ALBUTEROL 90 MCG/ACT IN AERS: 2.0000 | INHALATION_SPRAY | Freq: Two times a day (BID) | RESPIRATORY_TRACT | Status: DC | PRN

## 2011-07-11 MED ORDER — NAPROXEN 500 MG PO TABS: 500.0000 mg | ORAL_TABLET | Freq: Two times a day (BID) | ORAL | Status: DC

## 2011-07-11 NOTE — Patient Instructions (Signed)
Smoking tobacco is very bad for your health. You should stop smoking immediately.   Please check your hemoglobin A1c every 3 months  Limit your sodium (Salt) intake    It is important that you exercise regularly, at least 20 minutes 3 to 4 times per week.  If you develop chest pain or shortness of breath seek  medical attention.

## 2011-07-11 NOTE — Telephone Encounter (Signed)
Okay to fill per Dr Sandie Ano with patient and rx sent to pharmacy.

## 2011-07-11 NOTE — Telephone Encounter (Signed)
Pt was in today. Pt wife called in and is made because he did not receive any rx at his visit. Pt wife said he was supposed to get a refill on all rx and a rx for a breathing machine. Pt requesting you contact

## 2011-07-11 NOTE — Progress Notes (Signed)
Subjective:    Patient ID: Luis Carter, male    DOB: Apr 24, 1946, 65 y.o.   MRN: [id number]  HPI  65 year old patient who is seen today for followup. He has type 2 diabetes which has been controlled on metformin he takes 500 mg twice daily. He has treated hypertension and also history of COPD and asthma he continues to smoke approximately 1/2 pack of cigarettes daily he is on maintenance inhalational medications as well as when necessary albuterol he has chronic low back pain and does use naproxen. He has remote seizure disorder which has been stable. No new concerns or complaints. No complaints of back or leg pain today  Past Medical History  Diagnosis Date  . ASTHMA 04/14/2007  . COPD 04/14/2007  . DIABETES MELLITUS, TYPE II 04/14/2007  . HYPERTENSION 04/14/2007  . LEG PAIN, RIGHT 05/19/2008  . LOW BACK PAIN 04/14/2007  . PANCREATITIS, HX OF 04/14/2007  . PNEUMONIA, HX OF 04/14/2007  . SEIZURE DISORDER 04/14/2007    History   Social History  . Marital Status: Married    Spouse Name: N/A    Number of Children: N/A  . Years of Education: N/A   Occupational History  . Not on file.   Social History Main Topics  . Smoking status: Current Everyday Smoker -- 0.5 packs/day    Types: Cigarettes  . Smokeless tobacco: Never Used  . Alcohol Use: No  . Drug Use: No  . Sexually Active: Not on file   Other Topics Concern  . Not on file   Social History Narrative  . No narrative on file    No past surgical history on file.  Family History  Problem Relation Age of Onset  . Heart disease Sister   . Heart disease Brother   . Heart disease Brother   . Heart disease Brother   . Heart disease Brother     No Known Allergies  Current Outpatient Prescriptions on File Prior to Visit  Medication Sig Dispense Refill  . albuterol (PROVENTIL,VENTOLIN) 90 MCG/ACT inhaler Inhale 2 puffs into the lungs 2 (two) times daily as needed.  17 g  6  . aspirin 81 MG tablet Take 81 mg by mouth  daily.        . cyclobenzaprine (FLEXERIL) 10 MG tablet TAKE 1 TABLET BY MOUTH AT BEDTIME AS NEEDED FOR LEG PAIN  30 tablet  2  . Fluticasone-Salmeterol (ADVAIR DISKUS) 250-50 MCG/DOSE AEPB Inhale 1 puff into the lungs every 12 (twelve) hours.  60 each  6  . metFORMIN (GLUCOPHAGE) 500 MG tablet Take 1 tablet (500 mg total) by mouth 2 (two) times daily with a meal.  180 tablet  6  . naproxen (NAPROSYN) 500 MG tablet TAKE 1 TABLET BY MOUTH TWICE A DAY AS NEEDED  180 tablet  3  . SINGULAIR 10 MG tablet TAKE 1 TABLET BY MOUTH EVERY DAY  90 tablet  1  . triamcinolone (KENALOG) 0.1 % cream Apply topically 2 (two) times daily.  120 g  2    BP 110/70  Temp(Src) 98.1 F (36.7 C) (Oral)  Wt 200 lb (90.719 kg)     Review of Systems  Constitutional: Negative for fever, chills, appetite change and fatigue.  HENT: Negative for hearing loss, ear pain, congestion, sore throat, trouble swallowing, neck stiffness, dental problem, voice change and tinnitus.   Eyes: Negative for pain, discharge and visual disturbance.  Respiratory: Negative for cough, chest tightness, wheezing and stridor.   Cardiovascular: Negative for chest  pain, palpitations and leg swelling.  Gastrointestinal: Negative for nausea, vomiting, abdominal pain, diarrhea, constipation, blood in stool and abdominal distention.  Genitourinary: Negative for urgency, hematuria, flank pain, discharge, difficulty urinating and genital sores.  Musculoskeletal: Positive for back pain. Negative for myalgias, joint swelling, arthralgias and gait problem.  Skin: Negative for rash.  Neurological: Negative for dizziness, syncope, speech difficulty, weakness, numbness and headaches.  Hematological: Negative for adenopathy. Does not bruise/bleed easily.  Psychiatric/Behavioral: Negative for behavioral problems and dysphoric mood. The patient is not nervous/anxious.        Objective:   Physical Exam  Constitutional: He is oriented to person, place, and  time. He appears well-developed.  HENT:  Head: Normocephalic.  Right Ear: External ear normal.  Left Ear: External ear normal.  Eyes: Conjunctivae and EOM are normal.  Neck: Normal range of motion.  Cardiovascular: Normal rate and normal heart sounds.   Pulmonary/Chest: Effort normal and breath sounds normal.       Faint expiratory wheezes heard anteriorly  Abdominal: Bowel sounds are normal.  Musculoskeletal: Normal range of motion. He exhibits no edema and no tenderness.  Neurological: He is alert and oriented to person, place, and time.  Psychiatric: He has a normal mood and affect. His behavior is normal.          Assessment & Plan:   Diabetes mellitus. Hemoglobin A1c as have been consistently less than 7 we'll check again today. Hypertension Hypertension. Currently controlled off medication COPD/asthma total cessation of smoking encouraged all medications refilled Chronic low back pain. Stable

## 2011-10-10 ENCOUNTER — Encounter: Payer: Self-pay | Admitting: Internal Medicine

## 2011-10-10 ENCOUNTER — Ambulatory Visit (INDEPENDENT_AMBULATORY_CARE_PROVIDER_SITE_OTHER): Payer: Medicare Other | Admitting: Internal Medicine

## 2011-10-10 DIAGNOSIS — J449 Chronic obstructive pulmonary disease, unspecified: Secondary | ICD-10-CM

## 2011-10-10 DIAGNOSIS — I10 Essential (primary) hypertension: Secondary | ICD-10-CM

## 2011-10-10 DIAGNOSIS — E119 Type 2 diabetes mellitus without complications: Secondary | ICD-10-CM

## 2011-10-10 DIAGNOSIS — J45909 Unspecified asthma, uncomplicated: Secondary | ICD-10-CM

## 2011-10-10 MED ORDER — FLUTICASONE-SALMETEROL 250-50 MCG/DOSE IN AEPB: 1.0000 | INHALATION_SPRAY | Freq: Two times a day (BID) | RESPIRATORY_TRACT | Status: DC

## 2011-10-10 MED ORDER — ALBUTEROL 90 MCG/ACT IN AERS: 2.0000 | INHALATION_SPRAY | Freq: Two times a day (BID) | RESPIRATORY_TRACT | Status: DC | PRN

## 2011-10-10 MED ORDER — MONTELUKAST SODIUM 10 MG PO TABS: 10.0000 mg | ORAL_TABLET | Freq: Every day | ORAL | Status: DC

## 2011-10-10 MED ORDER — TRAMADOL HCL 50 MG PO TABS: 50.0000 mg | ORAL_TABLET | Freq: Three times a day (TID) | ORAL | Status: AC | PRN

## 2011-10-10 MED ORDER — CYCLOBENZAPRINE HCL 10 MG PO TABS: 10.0000 mg | ORAL_TABLET | Freq: Every day | ORAL | Status: DC

## 2011-10-10 MED ORDER — METFORMIN HCL 500 MG PO TABS: 500.0000 mg | ORAL_TABLET | Freq: Two times a day (BID) | ORAL | Status: DC

## 2011-10-10 NOTE — Progress Notes (Signed)
  Subjective:    Patient ID: Luis Carter, male    DOB: Jul 06, 1946, 66 y.o.   MRN: [id number]  HPI  66 year old patient who is in today for follow up. He has a history of type 2 diabetes. 3 months ago his hemoglobin A1c increased to 7.2 he states that he takes metformin 3 times daily. He has a history of COPD and ongoing tobacco use. He is on Advair maintenance as well as when necessary albuterol he continues to have occasional wheezing He has chronic low back pain and right leg pain. He also complains of some right shoulder discomfort as well. He is on naproxen. Fasting blood sugar today 113   Review of Systems  Constitutional: Negative for fever, chills, appetite change and fatigue.  HENT: Negative for hearing loss, ear pain, congestion, sore throat, trouble swallowing, neck stiffness, dental problem, voice change and tinnitus.   Eyes: Negative for pain, discharge and visual disturbance.  Respiratory: Negative for cough, chest tightness, wheezing and stridor.   Cardiovascular: Negative for chest pain, palpitations and leg swelling.  Gastrointestinal: Negative for nausea, vomiting, abdominal pain, diarrhea, constipation, blood in stool and abdominal distention.  Genitourinary: Negative for urgency, hematuria, flank pain, discharge, difficulty urinating and genital sores.  Musculoskeletal: Positive for back pain. Negative for myalgias, joint swelling, arthralgias (right shoulder pain and right leg pain) and gait problem.  Skin: Negative for rash.  Neurological: Negative for dizziness, syncope, speech difficulty, weakness, numbness and headaches.  Hematological: Negative for adenopathy. Does not bruise/bleed easily.  Psychiatric/Behavioral: Negative for behavioral problems and dysphoric mood. The patient is not nervous/anxious.        Objective:   Physical Exam  Constitutional: He is oriented to person, place, and time. He appears well-developed.  HENT:  Head: Normocephalic.  Right  Ear: External ear normal.  Left Ear: External ear normal.  Eyes: Conjunctivae and EOM are normal.  Neck: Normal range of motion.  Cardiovascular: Normal rate and normal heart sounds.   Pulmonary/Chest: Effort normal. He has wheezes.       Scattered faint expiratory wheezing  Abdominal: Bowel sounds are normal.  Musculoskeletal: Normal range of motion. He exhibits no edema and no tenderness.  Neurological: He is alert and oriented to person, place, and time.  Psychiatric: He has a normal mood and affect. His behavior is normal.          Assessment & Plan:    Diabetes mellitus. We'll check a hemoglobin A1c COPD with ongoing tobacco use. Still smoking cessation encouraged Osteoarthritis. We'll add tramadol to his regimen for additional pain control Chronic low back pain

## 2011-10-10 NOTE — Patient Instructions (Signed)
Limit your sodium (Salt) intake   Please check your hemoglobin A1c every 3 months  Smoking tobacco is very bad for your health. You should stop smoking immediately. 

## 2011-11-10 ENCOUNTER — Encounter (HOSPITAL_COMMUNITY): Payer: Self-pay

## 2011-11-10 ENCOUNTER — Emergency Department (INDEPENDENT_AMBULATORY_CARE_PROVIDER_SITE_OTHER)
Admission: EM | Admit: 2011-11-10 | Discharge: 2011-11-10 | Disposition: A | Discharge status: Home / Self Care | Payer: Medicare Other | Source: Home / Self Care

## 2011-11-10 ENCOUNTER — Ambulatory Visit: Payer: Medicare Other | Admitting: Internal Medicine

## 2011-11-10 DIAGNOSIS — M25519 Pain in unspecified shoulder: Secondary | ICD-10-CM

## 2011-11-10 DIAGNOSIS — M25511 Pain in right shoulder: Secondary | ICD-10-CM

## 2011-11-10 MED ORDER — HYDROCODONE-ACETAMINOPHEN 5-325 MG PO TABS: 1.0000 | ORAL_TABLET | Freq: Four times a day (QID) | ORAL | Status: AC | PRN

## 2011-11-10 MED ORDER — METHYLPREDNISOLONE ACETATE PF 40 MG/ML IJ SUSP
40.0000 mg | Freq: Once | INTRAMUSCULAR | Status: AC
  Administered 2011-11-10: 40 mg via INTRAMUSCULAR

## 2011-11-10 MED ORDER — METHYLPREDNISOLONE ACETATE 40 MG/ML IJ SUSP
INTRAMUSCULAR | Status: AC
  Filled 2011-11-10: qty 5

## 2011-11-10 NOTE — Discharge Instructions (Signed)
Ice packs 3-4 times a day for 15-20 minutes or more often as needed for discomfort. Hydrocodone can cause drowsiness. Be very careful when you take this. I recommend that you use this at bedtime since your pain is worse at night. Do not drive or operate machinery while taking this medication. Follow up with Dr Amador Cunas.   Shoulder Pain The shoulder is a ball and socket joint. Many muscles and tendons hold the joint together. Many types of injuries and medical problems can cause pain in one or more parts of the shoulder. HOME CARE  If your doctor feels the problem is not serious, it may help to do the following:  Put ice on the area.   Put ice in a plastic bag.   Place a towel between your skin and the bag.   Leave the ice on for 15 to 20 minutes, 3 to 4 times a day.   Do this for the first 2 day or as told by your doctor.   Stop using cold packs if they do not help with the pain.   Do not take your sling off (except to shower or bathe) until you see your doctor. When taking off the sling, move the arm as little as possible.   Take medicine as told by your doctor.   Keep all follow-up appointments.  GET HELP RIGHT AWAY IF:   The arm, hand, or fingers are numb or tingling.   The arm, hand, or fingers are puffy (swollen), painful, or turn white or blue.   You have trouble moving your hand and fingers on the injured side.   You have chest pain or shortness of breath.   New pain happens in the arm, hand, or fingers.   The hand or fingers on the injured side become cold.   The medicine is not helping the pain go away.  MAKE SURE YOU:   Understand these instructions.   Will watch your condition.   Will get help right away if you are not doing well or get worse.  Document Released: 01/28/2008 Document Revised: 07/31/2011 Document Reviewed: 01/28/2008 [facility] Patient Information 2012 [place], [place].

## 2011-11-10 NOTE — ED Provider Notes (Signed)
History     CSN: [id number]  Arrival date & time 11/10/11  1224   None     Chief Complaint  Patient presents with  . Arm Pain    (Consider location/radiation/quality/duration/timing/severity/associated sxs/prior treatment) HPI Comments: Patient presents today with complaints of right shoulder pain. He states that he began one year ago and is progressively worsening. Pain is worse at night while he is trying to sleep he also intermittently has pain during daytime hours. He has a history of arthritis. He has seen his primary care provider for this previously and was prescribed naproxen. He states the naproxen is no longer helping his pain. "I need something stronger". He denies injury.   Past Medical History  Diagnosis Date  . ASTHMA 04/14/2007  . COPD 04/14/2007  . DIABETES MELLITUS, TYPE II 04/14/2007  . HYPERTENSION 04/14/2007  . LEG PAIN, RIGHT 05/19/2008  . LOW BACK PAIN 04/14/2007  . PANCREATITIS, HX OF 04/14/2007  . PNEUMONIA, HX OF 04/14/2007  . SEIZURE DISORDER 04/14/2007    History reviewed. No pertinent past surgical history.  Family History  Problem Relation Age of Onset  . Heart disease Sister   . Heart disease Brother   . Heart disease Brother   . Heart disease Brother   . Heart disease Brother     History  Substance Use Topics  . Smoking status: Current Everyday Smoker -- 0.3 packs/day    Types: Cigarettes  . Smokeless tobacco: Never Used  . Alcohol Use: No      Review of Systems  Constitutional: Negative for fever and chills.  Musculoskeletal: Negative for joint swelling.  Neurological: Negative for weakness and numbness.    Allergies  Review of patient's allergies indicates no known allergies.  Home Medications   Current Outpatient Rx  Name Route Sig Dispense Refill  . ASPIRIN 81 MG PO TABS Oral Take 81 mg by mouth daily.      . CYCLOBENZAPRINE HCL 10 MG PO TABS Oral Take 1 tablet (10 mg total) by mouth at bedtime. 30 tablet 5  .  FLUTICASONE-SALMETEROL 250-50 MCG/DOSE IN AEPB Inhalation Inhale 1 puff into the lungs every 12 (twelve) hours. 60 each 6  . METFORMIN HCL 500 MG PO TABS Oral Take 1 tablet (500 mg total) by mouth 2 (two) times daily with a meal. 180 tablet 6  . MONTELUKAST SODIUM 10 MG PO TABS Oral Take 1 tablet (10 mg total) by mouth at bedtime. 90 tablet 1  . NAPROXEN 500 MG PO TABS Oral Take 1 tablet (500 mg total) by mouth 2 (two) times daily with a meal. 180 tablet 6  . TRIAMCINOLONE ACETONIDE 0.1 % EX CREA Topical Apply topically 2 (two) times daily. 120 g 2  . ALBUTEROL 90 MCG/ACT IN AERS Inhalation Inhale 2 puffs into the lungs 2 (two) times daily as needed. 17 g 6  . HYDROCODONE-ACETAMINOPHEN 5-325 MG PO TABS Oral Take 1 tablet by mouth every 6 (six) hours as needed for pain. 10 tablet 0    BP 134/66  Pulse 70  Temp(Src) 97.5 F (36.4 C) (Oral)  Resp 16  SpO2 96%  Physical Exam  Nursing note and vitals reviewed. Constitutional: He appears well-developed and well-nourished. No distress.  Cardiovascular: Normal rate, regular rhythm and normal heart sounds.   Pulmonary/Chest: Effort normal and breath sounds normal. No respiratory distress.  Musculoskeletal:       Left shoulder: He exhibits normal range of motion, no tenderness, no bony tenderness, no swelling, no effusion, no  crepitus, no deformity, no spasm, normal pulse and normal strength.  Neurological: He is alert.  Skin: Skin is warm and dry. No rash noted. No erythema.  Psychiatric: He has a normal mood and affect.    ED Course  Procedures (including critical care time)  Labs Reviewed - No data to display No results found.   1. Shoulder pain, right       MDM  Chronic left shoulder pain. Patient reports a history of arthritis. Exam negative.        Melody Comas, [place] 11/10/11 1454

## 2011-11-10 NOTE — ED Notes (Signed)
C/o pain in his right arm for past 3 weeks; denies trauma; pain worse at night , better in AM (almost no pain then ) using pain pills for relief w/o much result

## 2011-11-11 NOTE — ED Provider Notes (Signed)
Medical screening examination/treatment/procedure(s) were performed by non-physician practitioner and as supervising physician I was immediately available for consultation/collaboration.  LANEY,RONNIE   Raahi Korber B Laney, MD 11/11/11 1601 

## 2011-12-03 ENCOUNTER — Telehealth: Payer: Self-pay

## 2011-12-03 NOTE — Telephone Encounter (Signed)
[facility] rfill [facility] - for norco  Was rx'd in urgent care 11-10-11 for shoulder pain # [id number] Please advise last seen 09/2011 her

## 2011-12-04 NOTE — Telephone Encounter (Signed)
Called [facility] - this was not rx'd by dr. Amador Cunas - was rx by ER doctor - denied at this time - doctor is out of office until next week - no other provider will ok controled substance without being seen

## 2011-12-21 ENCOUNTER — Encounter (HOSPITAL_COMMUNITY)
Admission: EM | Disposition: A | Discharge status: Home / Self Care | Payer: Self-pay | Source: Home / Self Care | Attending: Internal Medicine

## 2011-12-21 ENCOUNTER — Encounter (HOSPITAL_COMMUNITY): Payer: Self-pay | Admitting: *Deleted

## 2011-12-21 ENCOUNTER — Emergency Department (HOSPITAL_COMMUNITY): Payer: Medicare Other

## 2011-12-21 ENCOUNTER — Inpatient Hospital Stay (HOSPITAL_COMMUNITY)
Admission: EM | Admit: 2011-12-21 | Discharge: 2011-12-22 | DRG: 378 | Disposition: A | Discharge status: Home / Self Care | Payer: Medicare Other | Attending: Internal Medicine | Admitting: Internal Medicine

## 2011-12-21 DIAGNOSIS — K922 Gastrointestinal hemorrhage, unspecified: Secondary | ICD-10-CM

## 2011-12-21 DIAGNOSIS — Z8709 Personal history of other diseases of the respiratory system: Secondary | ICD-10-CM

## 2011-12-21 DIAGNOSIS — Z8719 Personal history of other diseases of the digestive system: Secondary | ICD-10-CM

## 2011-12-21 DIAGNOSIS — Z23 Encounter for immunization: Secondary | ICD-10-CM

## 2011-12-21 DIAGNOSIS — N289 Disorder of kidney and ureter, unspecified: Secondary | ICD-10-CM | POA: Diagnosis present

## 2011-12-21 DIAGNOSIS — K920 Hematemesis: Secondary | ICD-10-CM | POA: Diagnosis present

## 2011-12-21 DIAGNOSIS — Z9861 Coronary angioplasty status: Secondary | ICD-10-CM

## 2011-12-21 DIAGNOSIS — M545 Low back pain, unspecified: Secondary | ICD-10-CM | POA: Diagnosis present

## 2011-12-21 DIAGNOSIS — T3995XA Adverse effect of unspecified nonopioid analgesic, antipyretic and antirheumatic, initial encounter: Secondary | ICD-10-CM | POA: Diagnosis present

## 2011-12-21 DIAGNOSIS — F1490 Cocaine use, unspecified, uncomplicated: Secondary | ICD-10-CM | POA: Diagnosis present

## 2011-12-21 DIAGNOSIS — I10 Essential (primary) hypertension: Secondary | ICD-10-CM | POA: Diagnosis present

## 2011-12-21 DIAGNOSIS — E119 Type 2 diabetes mellitus without complications: Secondary | ICD-10-CM | POA: Diagnosis present

## 2011-12-21 DIAGNOSIS — J4489 Other specified chronic obstructive pulmonary disease: Secondary | ICD-10-CM | POA: Diagnosis present

## 2011-12-21 DIAGNOSIS — F1410 Cocaine abuse, uncomplicated: Secondary | ICD-10-CM | POA: Diagnosis present

## 2011-12-21 DIAGNOSIS — J45909 Unspecified asthma, uncomplicated: Secondary | ICD-10-CM

## 2011-12-21 DIAGNOSIS — Z7982 Long term (current) use of aspirin: Secondary | ICD-10-CM

## 2011-12-21 DIAGNOSIS — Z79899 Other long term (current) drug therapy: Secondary | ICD-10-CM

## 2011-12-21 DIAGNOSIS — K254 Chronic or unspecified gastric ulcer with hemorrhage: Principal | ICD-10-CM | POA: Diagnosis present

## 2011-12-21 DIAGNOSIS — D62 Acute posthemorrhagic anemia: Secondary | ICD-10-CM | POA: Insufficient documentation

## 2011-12-21 DIAGNOSIS — M79609 Pain in unspecified limb: Secondary | ICD-10-CM | POA: Diagnosis present

## 2011-12-21 DIAGNOSIS — J449 Chronic obstructive pulmonary disease, unspecified: Secondary | ICD-10-CM

## 2011-12-21 DIAGNOSIS — R569 Unspecified convulsions: Secondary | ICD-10-CM

## 2011-12-21 DIAGNOSIS — E785 Hyperlipidemia, unspecified: Secondary | ICD-10-CM | POA: Diagnosis present

## 2011-12-21 DIAGNOSIS — F17200 Nicotine dependence, unspecified, uncomplicated: Secondary | ICD-10-CM | POA: Diagnosis present

## 2011-12-21 DIAGNOSIS — E1129 Type 2 diabetes mellitus with other diabetic kidney complication: Secondary | ICD-10-CM | POA: Diagnosis present

## 2011-12-21 DIAGNOSIS — I2510 Atherosclerotic heart disease of native coronary artery without angina pectoris: Secondary | ICD-10-CM | POA: Diagnosis present

## 2011-12-21 DIAGNOSIS — G8929 Other chronic pain: Secondary | ICD-10-CM | POA: Diagnosis present

## 2011-12-21 HISTORY — DX: Atherosclerotic heart disease of native coronary artery without angina pectoris: I25.10

## 2011-12-21 HISTORY — PX: ESOPHAGOGASTRODUODENOSCOPY: SHX5428

## 2011-12-21 LAB — RAPID URINE DRUG SCREEN, HOSP PERFORMED
Benzodiazepines: NOT DETECTED
Cocaine: POSITIVE — AB
Opiates: NOT DETECTED

## 2011-12-21 LAB — CBC
HCT: 29.1 % — ABNORMAL LOW (ref 39.0–52.0)
Hemoglobin: 9.5 g/dL — ABNORMAL LOW (ref 13.0–17.0)
RBC: 3.48 MIL/uL — ABNORMAL LOW (ref 4.22–5.81)
RDW: 14.8 % (ref 11.5–15.5)
WBC: 13.1 10*3/uL — ABNORMAL HIGH (ref 4.0–10.5)

## 2011-12-21 LAB — POCT I-STAT TROPONIN I: Troponin i, poc: 0.01 ng/mL (ref 0.00–0.08)

## 2011-12-21 LAB — ETHANOL: Alcohol, Ethyl (B): <11 mg/dL (ref 0–11)

## 2011-12-21 LAB — URINALYSIS, ROUTINE W REFLEX MICROSCOPIC
Glucose, UA: 500 mg/dL — AB
Ketones, ur: NEGATIVE mg/dL
pH: 5.5 (ref 5.0–8.0)

## 2011-12-21 LAB — DIFFERENTIAL
Basophils Absolute: 0 10*3/uL (ref 0.0–0.1)
Lymphocytes Relative: 19 % (ref 12–46)
Monocytes Absolute: 0.8 10*3/uL (ref 0.1–1.0)
Monocytes Relative: 6 % (ref 3–12)
Neutro Abs: 8.8 10*3/uL — ABNORMAL HIGH (ref 1.7–7.7)

## 2011-12-21 LAB — ABO/RH: ABO/RH(D): A POS

## 2011-12-21 LAB — GLUCOSE, CAPILLARY
Glucose-Capillary: 66 mg/dL — ABNORMAL LOW (ref 70–99)
Glucose-Capillary: 75 mg/dL (ref 70–99)

## 2011-12-21 LAB — HEMOGLOBIN AND HEMATOCRIT, BLOOD
HCT: 28.9 % — ABNORMAL LOW (ref 39.0–52.0)
HCT: 25.7 % — ABNORMAL LOW (ref 39.0–52.0)
Hemoglobin: 8.6 g/dL — ABNORMAL LOW (ref 13.0–17.0)

## 2011-12-21 LAB — BASIC METABOLIC PANEL
CO2: 23 mEq/L (ref 19–32)
Chloride: 103 mEq/L (ref 96–112)
Creatinine, Ser: 1.51 mg/dL — ABNORMAL HIGH (ref 0.50–1.35)

## 2011-12-21 LAB — OCCULT BLOOD, POC DEVICE: Fecal Occult Bld: POSITIVE

## 2011-12-21 LAB — URINE MICROSCOPIC-ADD ON

## 2011-12-21 SURGERY — EGD (ESOPHAGOGASTRODUODENOSCOPY)
Anesthesia: Moderate Sedation

## 2011-12-21 MED ORDER — PNEUMOCOCCAL VAC POLYVALENT 25 MCG/0.5ML IJ INJ
0.5000 mL | INJECTION | INTRAMUSCULAR | Status: AC
  Administered 2011-12-22: 0.5 mL via INTRAMUSCULAR
  Filled 2011-12-21: qty 0.5

## 2011-12-21 MED ORDER — PANTOPRAZOLE SODIUM 40 MG IV SOLR
40.0000 mg | Freq: Once | INTRAVENOUS | Status: AC
  Administered 2011-12-21: 40 mg via INTRAVENOUS
  Filled 2011-12-21: qty 40

## 2011-12-21 MED ORDER — DIPHENHYDRAMINE HCL 50 MG/ML IJ SOLN
INTRAMUSCULAR | Status: DC | PRN
  Administered 2011-12-21: 25 mg via INTRAVENOUS

## 2011-12-21 MED ORDER — GLYCOPYRROLATE 0.2 MG/ML IJ SOLN
INTRAMUSCULAR | Status: DC | PRN
  Administered 2011-12-21: 0.2 mg via INTRAVENOUS

## 2011-12-21 MED ORDER — SODIUM CHLORIDE 0.9 % IV BOLUS (SEPSIS)
1000.0000 mL | Freq: Once | INTRAVENOUS | Status: AC
  Administered 2011-12-21 (×2): 1000 mL via INTRAVENOUS

## 2011-12-21 MED ORDER — DIPHENHYDRAMINE HCL 50 MG/ML IJ SOLN
INTRAMUSCULAR | Status: AC
  Filled 2011-12-21: qty 1

## 2011-12-21 MED ORDER — SODIUM CHLORIDE 0.9 % IV SOLN
INTRAVENOUS | Status: DC
  Administered 2011-12-21 – 2011-12-22 (×2): via INTRAVENOUS

## 2011-12-21 MED ORDER — ALBUTEROL SULFATE HFA 108 (90 BASE) MCG/ACT IN AERS
2.0000 | INHALATION_SPRAY | Freq: Two times a day (BID) | RESPIRATORY_TRACT | Status: DC | PRN
  Administered 2011-12-21: 2 via RESPIRATORY_TRACT
  Filled 2011-12-21: qty 6.7

## 2011-12-21 MED ORDER — ONDANSETRON HCL 4 MG/2ML IJ SOLN
4.0000 mg | Freq: Three times a day (TID) | INTRAMUSCULAR | Status: DC | PRN
  Administered 2011-12-21: 4 mg via INTRAVENOUS
  Filled 2011-12-21: qty 2

## 2011-12-21 MED ORDER — PANTOPRAZOLE SODIUM 40 MG IV SOLR
40.0000 mg | Freq: Two times a day (BID) | INTRAVENOUS | Status: DC
  Administered 2011-12-21 – 2011-12-22 (×2): 40 mg via INTRAVENOUS
  Filled 2011-12-21 (×3): qty 40

## 2011-12-21 MED ORDER — ONDANSETRON HCL 4 MG/2ML IJ SOLN: 4.0000 mg | Freq: Four times a day (QID) | INTRAMUSCULAR | Status: DC | PRN

## 2011-12-21 MED ORDER — MIDAZOLAM HCL 10 MG/2ML IJ SOLN
INTRAMUSCULAR | Status: AC
  Filled 2011-12-21: qty 4

## 2011-12-21 MED ORDER — ALUM & MAG HYDROXIDE-SIMETH 200-200-20 MG/5ML PO SUSP: 30.0000 mL | Freq: Four times a day (QID) | ORAL | Status: DC | PRN

## 2011-12-21 MED ORDER — HYDROCODONE-ACETAMINOPHEN 5-325 MG PO TABS: 1.0000 | ORAL_TABLET | ORAL | Status: DC | PRN

## 2011-12-21 MED ORDER — LORAZEPAM 2 MG/ML IJ SOLN
1.0000 mg | Freq: Once | INTRAMUSCULAR | Status: AC
  Administered 2011-12-21: 1 mg via INTRAVENOUS
  Filled 2011-12-21: qty 1

## 2011-12-21 MED ORDER — SODIUM CHLORIDE 0.9 % IV SOLN
INTRAVENOUS | Status: AC
  Administered 2011-12-21: 13:00:00 via INTRAVENOUS

## 2011-12-21 MED ORDER — FLUTICASONE-SALMETEROL 250-50 MCG/DOSE IN AEPB
1.0000 | INHALATION_SPRAY | Freq: Two times a day (BID) | RESPIRATORY_TRACT | Status: DC
  Administered 2011-12-21: 1 via RESPIRATORY_TRACT
  Filled 2011-12-21: qty 14

## 2011-12-21 MED ORDER — GLYCOPYRROLATE 0.2 MG/ML IJ SOLN
INTRAMUSCULAR | Status: AC
  Filled 2011-12-21: qty 1

## 2011-12-21 MED ORDER — PANTOPRAZOLE SODIUM 40 MG IV SOLR
40.0000 mg | Freq: Two times a day (BID) | INTRAVENOUS | Status: DC
  Filled 2011-12-21 (×3): qty 40

## 2011-12-21 MED ORDER — FENTANYL CITRATE 0.05 MG/ML IJ SOLN
INTRAMUSCULAR | Status: AC
  Filled 2011-12-21: qty 4

## 2011-12-21 MED ORDER — FENTANYL NICU IV SYRINGE 50 MCG/ML
INJECTION | INTRAMUSCULAR | Status: DC | PRN
  Administered 2011-12-21: 25 ug via INTRAVENOUS

## 2011-12-21 MED ORDER — MIDAZOLAM HCL 5 MG/5ML IJ SOLN
INTRAMUSCULAR | Status: DC | PRN
  Administered 2011-12-21: 2 mg via INTRAVENOUS

## 2011-12-21 MED ORDER — ONDANSETRON HCL 4 MG PO TABS: 4.0000 mg | ORAL_TABLET | Freq: Four times a day (QID) | ORAL | Status: DC | PRN

## 2011-12-21 MED ORDER — MONTELUKAST SODIUM 10 MG PO TABS
10.0000 mg | ORAL_TABLET | Freq: Every day | ORAL | Status: DC
  Administered 2011-12-21: 10 mg via ORAL
  Filled 2011-12-21 (×2): qty 1

## 2011-12-21 MED ORDER — ALBUTEROL 90 MCG/ACT IN AERS: 2.0000 | INHALATION_SPRAY | Freq: Two times a day (BID) | RESPIRATORY_TRACT | Status: DC | PRN
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
## 2011-12-21 NOTE — Progress Notes (Signed)
EGD showed multiple gastric ulcers with old pigment at the base of the ulcers, c/w NSAID-related ulcers.  He in no longer bleeding.  Recommendations #1 protonix bid #2 regular diet #3 hold NSAIDS

## 2011-12-21 NOTE — ED Notes (Signed)
Attempted IV site times two without success. Good blood return each time IV blew as soon as fluid was introduced.
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
## 2011-12-21 NOTE — ED Notes (Signed)
Dr. Arlyce Dice PA has seen patient and planning for upper endo today.  Pt is to stay NPO.

## 2011-12-21 NOTE — ED Notes (Signed)
Lab drawing blood and family to bedside. Pt had no incontinence. Pt reports weakness in legs almost falls. Pt reports leg cramps

## 2011-12-21 NOTE — ED Notes (Signed)
Pt's CBG was 141 when I checked it. 10:12am JG.
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
## 2011-12-21 NOTE — Progress Notes (Signed)
12/21/11 1333  Discharge Planning  Type of Residence Private residence  Living Arrangements Spouse/significant other;Children  Home Care Services No  Support Systems Spouse/significant other;Children;Parent  Do you have any problems obtaining your medications? No  Once you are discharged, how will you get to your follow-up appointment? Self;Family  Expected Discharge Date 12/23/11  Case Management Consult Needed No  Social Work Consult Needed Yes (Comment)

## 2011-12-21 NOTE — ED Notes (Signed)
Pt is back from xray and ct.  Remains alert with no seizure activity

## 2011-12-21 NOTE — ED Provider Notes (Signed)
Medical screening examination/treatment/procedure(s) were conducted as a shared visit with non-physician practitioner(s) and myself.  I personally evaluated the patient during the encounter  Patient would most likely seizure event this morning past history of seizures but very remote. Bigger concern is the drop in his hemoglobin and hematocrit clinically in the face of a history of vomiting blood last evening and heme positive stools will require admission and observation to make sure that he doesn't have a significant GI bleed developing. Right now hemoglobin and hematocrit is borderline. But does not require blood transfusion at this point. Vital signs are stable heart rate not elevated blood pressure systolic 122.     Shelda Jakes, MD 12/21/11 [phone]

## 2011-12-21 NOTE — ED Notes (Signed)
1000cc NS bolus has infused site clear.Second bag of NS hung at [facility] rate.

## 2011-12-21 NOTE — Progress Notes (Signed)
12/21/11 1335  OTHER  CSW Follow Up Status Follow-up required    SBIRT  To be completed via unit based LCSW. Pt is with + UDS/Cocaine.

## 2011-12-21 NOTE — ED Notes (Signed)
Per ems the family witnessed seizure like activity and pt was altered on arrival by ems.  CBG originally was 122 and ems stated patient was altered and diaphoretic.  EKG was normal per ems. Ems gave one amp of D50 and pt more alert. CBG recheck was 166.  Pt told ems he had seizure maybe 25 years ago

## 2011-12-22 ENCOUNTER — Ambulatory Visit (HOSPITAL_COMMUNITY): Admit: 2011-12-22 | Payer: Medicare Other | Admitting: Gastroenterology

## 2011-12-22 ENCOUNTER — Encounter: Payer: Self-pay | Admitting: Gastroenterology

## 2011-12-22 ENCOUNTER — Encounter (HOSPITAL_COMMUNITY): Payer: Self-pay | Admitting: Gastroenterology

## 2011-12-22 DIAGNOSIS — F1490 Cocaine use, unspecified, uncomplicated: Secondary | ICD-10-CM | POA: Diagnosis present

## 2011-12-22 LAB — CBC
MCH: 27.6 pg (ref 26.0–34.0)
MCHC: 32.4 g/dL (ref 30.0–36.0)
Platelets: 254 10*3/uL (ref 150–400)

## 2011-12-22 MED ORDER — PANTOPRAZOLE SODIUM 40 MG PO TBEC: 40.0000 mg | DELAYED_RELEASE_TABLET | Freq: Every day | ORAL | Status: DC

## 2011-12-22 MED ORDER — ASPIRIN 81 MG PO TABS: 81.0000 mg | ORAL_TABLET | Freq: Every day | ORAL | Status: DC
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
## 2011-12-22 NOTE — Progress Notes (Signed)
Utilization Review Completed.Luis Carter T4/29/2013   

## 2011-12-22 NOTE — Op Note (Signed)
[name] [address] [place], [place]  [id number]  ENDOSCOPY PROCEDURE REPORT  PATIENT:  Luis Carter, Luis Carter  MR#:  [id number] BIRTHDATE:  02-02-1946, 66 yrs. old  GENDER:  male  ENDOSCOPIST:  Barbette Hair. Arlyce Dice, MD Referred by:  Eleonore Chiquito, M.D.  PROCEDURE DATE:  12/21/2011 PROCEDURE:  EGD with biopsy, [address] ASA CLASS:  Class II INDICATIONS:  melena, hematemesis  MEDICATIONS:   Fentanyl 25 mcg IV, Versed 2 mg IV, Benadryl 25 mg IV, glycopyrrolate (Robinal) 0.2 mg IV TOPICAL ANESTHETIC:  Cetacaine Spray  DESCRIPTION OF PROCEDURE:   After the risks and benefits of the procedure were explained, informed consent was obtained.  The Pentax Gastroscope [id number] endoscope was introduced through the mouth and advanced to the third portion of the duodenum.  The instrument was slowly withdrawn as the mucosa was fully examined. <<PROCEDUREIMAGES>>  Multiple ulcers were found in the body of the stomach. Multiple (at least 7) superficial ulcers ranging from 3-23mm, several with pigment at the base. No fresh of old blood. Biopsies were taken (see image6 and image4).  Otherwise the examination was normal (see image1, image5, and image8).    Retroflexed views revealed no abnormalities.    The scope was then withdrawn from the patient and the procedure completed.  COMPLICATIONS:  None  ENDOSCOPIC IMPRESSION: 1) Ulcers, multiple in the body of the stomach 2) Otherwise normal examination RECOMMENDATIONS:Hold NSAIDS Protonix bid  ______________________________ Barbette Hair. Arlyce Dice, MD  CC:  n. eSIGNED:   Barbette Hair. Jelan Batterton at 12/21/2011 04:00 PM  Elise Benne, [phone]

## 2011-12-24 ENCOUNTER — Encounter: Payer: Self-pay | Admitting: Gastroenterology

## 2011-12-24 NOTE — Progress Notes (Signed)
Quick Note:  Will prescribe prevpak ______

## 2011-12-25 ENCOUNTER — Telehealth: Payer: Self-pay

## 2011-12-25 MED ORDER — AMOXICILL-CLARITHRO-LANSOPRAZ PO MISC: Freq: Two times a day (BID) | ORAL | Status: DC

## 2011-12-25 NOTE — Telephone Encounter (Signed)
Spoke with pt and he is aware of bx results from EGD. Rx sent to pharmacy for H Pylori, see results letter.

## 2011-12-26 ENCOUNTER — Ambulatory Visit (INDEPENDENT_AMBULATORY_CARE_PROVIDER_SITE_OTHER): Payer: Medicare Other | Admitting: Family

## 2011-12-26 ENCOUNTER — Encounter: Payer: Self-pay | Admitting: Family

## 2011-12-26 VITALS — BP 130/84 | Temp 98.0°F | Wt 200.0 lb

## 2011-12-26 DIAGNOSIS — K259 Gastric ulcer, unspecified as acute or chronic, without hemorrhage or perforation: Secondary | ICD-10-CM

## 2011-12-26 DIAGNOSIS — B9681 Helicobacter pylori [H. pylori] as the cause of diseases classified elsewhere: Secondary | ICD-10-CM

## 2011-12-26 DIAGNOSIS — K219 Gastro-esophageal reflux disease without esophagitis: Secondary | ICD-10-CM

## 2011-12-26 DIAGNOSIS — A048 Other specified bacterial intestinal infections: Secondary | ICD-10-CM

## 2011-12-26 LAB — CBC WITH DIFFERENTIAL/PLATELET
Basophils Absolute: 0.1 10*3/uL (ref 0.0–0.1)
Eosinophils Absolute: 1.5 10*3/uL — ABNORMAL HIGH (ref 0.0–0.7)
MCHC: 32.6 g/dL (ref 30.0–36.0)
MCV: 87.3 fl (ref 78.0–100.0)
Monocytes Absolute: 1 10*3/uL (ref 0.1–1.0)
Neutrophils Relative %: 52.5 % (ref 43.0–77.0)
Platelets: 388 10*3/uL (ref 150.0–400.0)
RDW: 15.4 % — ABNORMAL HIGH (ref 11.5–14.6)

## 2011-12-26 MED ORDER — OMEPRAZOLE 20 MG PO CPDR: 20.0000 mg | DELAYED_RELEASE_CAPSULE | Freq: Two times a day (BID) | ORAL | Status: DC

## 2011-12-26 MED ORDER — AMOXICILLIN 500 MG PO CAPS: 500.0000 mg | ORAL_CAPSULE | Freq: Three times a day (TID) | ORAL | Status: AC

## 2011-12-26 MED ORDER — CLARITHROMYCIN 500 MG PO TABS: 500.0000 mg | ORAL_TABLET | Freq: Two times a day (BID) | ORAL | Status: AC

## 2011-12-26 NOTE — Progress Notes (Signed)
Subjective:    Patient ID: Luis Carter, male    DOB: 1945-11-21, 66 y.o.   MRN: [id number]  HPI 66 year old African American male, nonsmoker, patient of Dr. Kirtland Bouchard. is in today for an hospital followup. He was admitted to the hospital with bleeding ulcers and a low hemoglobin. Patient initially presented vomiting bloated. He had been taken naproxen better since been discontinued. He is currently taken protonix. Patient believes the Protonix makes him feel worse. Patient reports the hospital calling in the medication for him for his stomach that he's been unable to afford. Per the telephone notes, it appears to call him a Prevpac. Patient denies any continued bleeding, no dark black stools no blood in the stools.   Review of Systems  Constitutional: Negative.   HENT: Negative.   Respiratory: Negative.   Cardiovascular: Negative.   Gastrointestinal: Negative.  Negative for abdominal pain, abdominal distention and anal bleeding.  Genitourinary: Negative.   Musculoskeletal: Negative.   Skin: Negative.   Neurological: Negative.   Hematological: Negative.   Psychiatric/Behavioral: Negative.    Past Medical History  Diagnosis Date  . ASTHMA 04/14/2007  . COPD 04/14/2007  . DIABETES MELLITUS, TYPE II 04/14/2007  . HYPERTENSION 04/14/2007  . LEG PAIN, RIGHT 05/19/2008  . LOW BACK PAIN 04/14/2007  . PANCREATITIS, HX OF 04/14/2007  . PNEUMONIA, HX OF 04/14/2007  . SEIZURE DISORDER 04/14/2007  . Coronary artery disease     History   Social History  . Marital Status: Married    Spouse Name: N/A    Number of Children: N/A  . Years of Education: N/A   Occupational History  . Not on file.   Social History Main Topics  . Smoking status: Current Everyday Smoker -- 0.2 packs/day for 30 years    Types: Cigarettes  . Smokeless tobacco: Never Used  . Alcohol Use: No     former  . Drug Use: No  . Sexually Active: Not on file   Other Topics Concern  . Not on file   Social History Narrative    . No narrative on file    Past Surgical History  Procedure Date  . Coronary stent placement   . Esophagogastroduodenoscopy 12/21/2011    Procedure: ESOPHAGOGASTRODUODENOSCOPY (EGD);  Surgeon: Louis Meckel, MD;  Location: [facility] ENDOSCOPY;  Service: Endoscopy;  Laterality: N/A;    Family History  Problem Relation Age of Onset  . Heart disease Sister   . Heart disease Brother   . Heart disease Brother   . Heart disease Brother   . Heart disease Brother     No Known Allergies  Current Outpatient Prescriptions on File Prior to Visit  Medication Sig Dispense Refill  . albuterol (PROVENTIL,VENTOLIN) 90 MCG/ACT inhaler Inhale 2 puffs into the lungs 2 (two) times daily as needed.  17 g  6  . amoxicillin-clarithromycin-lansoprazole (PREVPAC) combo pack Take by mouth 2 (two) times daily. Follow package directions.  1 kit  0  . aspirin 81 MG tablet Take 1 tablet (81 mg total) by mouth daily.  30 tablet    . cyclobenzaprine (FLEXERIL) 10 MG tablet Take 1 tablet (10 mg total) by mouth at bedtime.  30 tablet  5  . Fluticasone-Salmeterol (ADVAIR DISKUS) 250-50 MCG/DOSE AEPB Inhale 1 puff into the lungs every 12 (twelve) hours.  60 each  6  . metFORMIN (GLUCOPHAGE) 500 MG tablet Take 1 tablet (500 mg total) by mouth 2 (two) times daily with a meal.  180 tablet  6  .  montelukast (SINGULAIR) 10 MG tablet Take 1 tablet (10 mg total) by mouth at bedtime.  90 tablet  1  . pantoprazole (PROTONIX) 40 MG tablet Take 1 tablet (40 mg total) by mouth daily.  60 tablet  0  . triamcinolone cream (KENALOG) 0.1 % Apply topically 2 (two) times daily.  120 g  2  . omeprazole (PRILOSEC) 20 MG capsule Take 1 capsule (20 mg total) by mouth 2 (two) times daily.  28 capsule  3    BP 130/84  Temp(Src) 98 F (36.7 C) (Oral)  Wt 200 lb (90.719 kg)chart     Objective:   Physical Exam  Constitutional: He is oriented to person, place, and time. He appears well-developed and well-nourished.  HENT:  Right Ear:  External ear normal.  Left Ear: External ear normal.  Nose: Nose normal.  Mouth/Throat: Oropharynx is clear and moist.  Neck: Normal range of motion. Neck supple.  Cardiovascular: Normal rate, regular rhythm and normal heart sounds.   Pulmonary/Chest: Effort normal and breath sounds normal.  Neurological: He is alert and oriented to person, place, and time.  Skin: Skin is warm and dry.  Psychiatric: He has a normal mood and affect.          Assessment & Plan:  Assessment: Bleeding ulcers, H. pylori, anemia  Plan: CBC sent. Since patient cannot afford a Prevpac, amoxicillin vomiting milligrams twice a day x14 days and Biaxin 500 mg twice a day x14 days. DC Protonix. Start omeprazole 20 mg twice a day. Patient has an appointment with Dr. Kirtland Bouchard. in about 2 weeks. He will follow with his PCP at that time and sooner when necessary.

## 2011-12-26 NOTE — Patient Instructions (Signed)
Helicobacter Pylori and Ulcer Disease An ulcer may be in your stomach (gastric ulcer) or in the first part of your small bowel, which is called the duodenum (duodenal ulcer). An ulcer is a break in the stomach or duodenum lining. The break wears down into the deeper tissue. Helicobacter pylori (H. pylori) is a type of germ (bacteria) that may cause the majority of gastric or duodenal ulcers. CAUSES   A germ (bacterium). H. pylori can weaken the protective mucous coating of the stomach and duodenum. This allows acid to get through to the sensitive lining of the stomach or duodenum and an ulcer can then form.   Certain medications.   Using substances that can bother the lining of the stomach (alcohol, tobacco or medications such as Advil or Motrin) in the presence of H.pylori infection. This can increase the chances of getting an ulcer.   Cancer (rarely).  Most people infected with H. pylori do not get ulcers. It is not known how people catch H. pylori. It may be through food or water. H. pylori has been found in the saliva of some infected people. Therefore, the bacteria may also spread through mouth-to-mouth contact such as kissing. SYMPTOMS  The problems (symptoms) of ulcer disease are usually:  A burning or gnawing of the mid-upper belly (abdomen). This is often worse on an empty stomach. It may get better with food. This may be associated with feeling sick to your stomach (nausea), bloating and vomiting.   If the ulcer results in bleeding, it can cause:   Black, tarry stools.   Throwing up bright red blood.   Throwing up coffee ground looking materials.  With severe bleeding, there may be loss of consciousness and shock. Besides ulcer disease, H. pylori can also cause chronic gastritis (irritation of the lining of the stomach without ulcer) or stomach acid-type discomfort. You may not have symptoms even though you have an H. pylori infection. Although this is an infection, you may not  have usual infection symptoms (such as fever). DIAGNOSIS  Ulcer disease can be diagnosed in many different ways. If you have an ulcer, it is important to know whether or not it is caused by H. Pylori. Treatment for an ulcer caused by H. pylori is different from that for an ulcer with other causes. The best way to detect H. pylori is taking tissue directly from the ulcer during an endoscopy test.   An endoscopy is an exam that uses an endoscope. This is a thin, lighted tube with a small camera on the end. It is like a flexible telescope. The patient is given a drug to make them calm (sedative). The caregiver eases the endoscope into the mouth and down the throat to the stomach and duodenum. This allows the doctor to see the lining of the esophagus, stomach and duodenum.   If an endoscopy is not needed, then H. pylori can be detected with tests of the blood, stool or even breath.  TREATMENT   H. pylori peptic ulcer treatment usually involves a combination of:   Medicines that kill germs (antibiotics).   Acid suppressors.   Stomach protectors.   The use of only one medication to treat H. pylori is not recommended. The most proven treatment is a 2 week course of treatment called triple therapy. It involves taking two antibiotics to kill the bacteria and either an acid suppressor or stomach-lining shield. Two-week triple therapy reduces ulcer symptoms, kills the bacteria, and prevents ulcers from coming back in many   patients.   Unfortunately, patients may find triple therapy hard to do. This is because it involves taking as many as 20 pills a day. Also, the antibiotics used in triple therapy may cause mild side effects. These include nausea, vomiting, diarrhea, dark stools, a metallic taste in the mouth, dizziness, headache and yeast infections in women. Talk to your caregiver if you have any of these side effects.  HOME CARE INSTRUCTIONS   Take your medications as directed and for as long as  prescribed. Contact your caregiver if you have problems or side effects from your medications.   Continue regular work and usual activities unless told otherwise by your caregiver.   Avoid tobacco, alcohol and caffeine. Tobacco use will decrease and slow healing.   Avoid medications that are harmful. This includes aspirin and NSAIDS such as ibuprofen and naproxen.   Avoid foods that seem to aggravate or cause discomfort.   There are many over-the-counter products available to control stomach acid and other symptoms. Discuss these with your caregiver before using them. Do not  stop taking prescription medications for over-the-counter medications without talking with your caregiver.   Special diets are not usually needed.   Keep any follow-up appointments and blood tests as directed.  SEEK MEDICAL CARE IF:   Your pain or other ulcer symptoms do not improve within a few days of starting treatment.   You develop diarrhea. This can be a problem related to certain treatments.   You have ongoing indigestion or heartburn even if your main ulcer symptoms are improved.   You think you have any side effects from your medications or if you do not understand how to use your medications right.  SEEK IMMEDIATE MEDICAL CARE IF:  Any of the following happen:  You develop bright red, rectal bleeding.   You develop dark black, tarry stools.   You throw up (vomit) blood.   You become light-headed, weak, have fainting episodes, or become sweaty, cold and clammy.   You have severe abdominal pain not controlled by medications. Do not take pain medications unless ordered by your caregiver.  MAKE SURE YOU:   Understand these instructions.   Will watch your condition.   Will get help right away if you are not doing well or get worse.  Document Released: 11/01/2003 Document Revised: 07/31/2011 Document Reviewed: 03/30/2008 [employer] Patient Information 2012 [employer]. 

## 2012-01-07 ENCOUNTER — Ambulatory Visit (INDEPENDENT_AMBULATORY_CARE_PROVIDER_SITE_OTHER): Payer: Medicare Other | Admitting: Internal Medicine

## 2012-01-07 ENCOUNTER — Encounter: Payer: Self-pay | Admitting: Internal Medicine

## 2012-01-07 VITALS — BP 110/80 | Temp 97.8°F | Wt 200.0 lb

## 2012-01-07 DIAGNOSIS — I10 Essential (primary) hypertension: Secondary | ICD-10-CM

## 2012-01-07 DIAGNOSIS — D62 Acute posthemorrhagic anemia: Secondary | ICD-10-CM

## 2012-01-07 DIAGNOSIS — J4489 Other specified chronic obstructive pulmonary disease: Secondary | ICD-10-CM

## 2012-01-07 DIAGNOSIS — J449 Chronic obstructive pulmonary disease, unspecified: Secondary | ICD-10-CM

## 2012-01-07 DIAGNOSIS — E119 Type 2 diabetes mellitus without complications: Secondary | ICD-10-CM

## 2012-01-07 MED ORDER — TRAMADOL HCL 50 MG PO TABS: 50.0000 mg | ORAL_TABLET | Freq: Three times a day (TID) | ORAL | Status: AC | PRN

## 2012-01-07 NOTE — Progress Notes (Signed)
  Subjective:    Patient ID: Luis Carter, male    DOB: 12-09-1945, 66 y.o.   MRN: [id number]  HPI  66 year old patient who is seen today in followup. He was hospitalized last month for upper GI bleeding secondary to multiple gastric ulcers in the setting of naproxen use; he is completing therapy also for H. pylori infection. He remains on omeprazole and has avoided naproxen use. He complains of significant pain mainly in the back and left arm. He has a history of COPD and asthma and continues to smoke. There is a history of substance abuse. He has treated hypertension. One week ago followup hemoglobin was much improved. He is not taking iron supplementation.   Review of Systems  Constitutional: Negative for fever, chills, appetite change and fatigue.  HENT: Negative for hearing loss, ear pain, congestion, sore throat, trouble swallowing, neck stiffness, dental problem, voice change and tinnitus.   Eyes: Negative for pain, discharge and visual disturbance.  Respiratory: Positive for shortness of breath. Negative for cough, chest tightness, wheezing and stridor.   Cardiovascular: Negative for chest pain, palpitations and leg swelling.  Gastrointestinal: Negative for nausea, vomiting, abdominal pain, diarrhea, constipation, blood in stool and abdominal distention.  Genitourinary: Negative for urgency, hematuria, flank pain, discharge, difficulty urinating and genital sores.  Musculoskeletal: Positive for arthralgias. Negative for myalgias, back pain, joint swelling and gait problem.  Skin: Negative for rash.  Neurological: Negative for dizziness, syncope, speech difficulty, weakness, numbness and headaches.  Hematological: Negative for adenopathy. Does not bruise/bleed easily.  Psychiatric/Behavioral: Negative for behavioral problems and dysphoric mood. The patient is not nervous/anxious.        Objective:   Physical Exam  Constitutional: He is oriented to person, place, and time. He  appears well-developed.       Blood pressure 120/70  HENT:  Head: Normocephalic.  Right Ear: External ear normal.  Left Ear: External ear normal.  Eyes: Conjunctivae and EOM are normal.  Neck: Normal range of motion.  Cardiovascular: Normal rate and normal heart sounds.   Pulmonary/Chest: Effort normal.       Scattered faint rhonchi  Abdominal: Soft. Bowel sounds are normal. He exhibits no distension. There is no tenderness. There is no rebound.  Musculoskeletal: Normal range of motion. He exhibits no edema and no tenderness.  Neurological: He is alert and oriented to person, place, and time.  Psychiatric: He has a normal mood and affect. His behavior is normal.          Assessment & Plan:   Status post upper GI bleed secondary to multiple gastric ulcers. Will continue off anti-inflammatory medications as well as aspirin. Daily iron therapy recommended for the next 4 weeks Hypertension stable Diabetes mellitus. We'll check a hemoglobin A1c COPD/asthma. Samples provided total cessation of smoking encouraged   Recheck 3 months

## 2012-01-07 NOTE — Patient Instructions (Signed)
Take an iron pill twice daily for the next 4 weeks  Smoking tobacco is very bad for your health. You should stop smoking immediately.    It is important that you exercise regularly, at least 20 minutes 3 to 4 times per week.  If you develop chest pain or shortness of breath seek  medical attention. and he is afebrile   Please check your hemoglobin A1c every 3 months

## 2012-01-09 ENCOUNTER — Ambulatory Visit: Payer: Medicare Other | Admitting: Internal Medicine

## 2012-01-21 ENCOUNTER — Emergency Department (INDEPENDENT_AMBULATORY_CARE_PROVIDER_SITE_OTHER)
Admission: EM | Admit: 2012-01-21 | Discharge: 2012-01-21 | Disposition: A | Discharge status: Home / Self Care | Payer: Medicare Other | Source: Home / Self Care | Attending: Emergency Medicine | Admitting: Emergency Medicine

## 2012-01-21 ENCOUNTER — Encounter (HOSPITAL_COMMUNITY): Payer: Self-pay

## 2012-01-21 DIAGNOSIS — M109 Gout, unspecified: Secondary | ICD-10-CM

## 2012-01-21 DIAGNOSIS — K5900 Constipation, unspecified: Secondary | ICD-10-CM

## 2012-01-21 MED ORDER — ACETAMINOPHEN-CODEINE #3 300-30 MG PO TABS: 1.0000 | ORAL_TABLET | Freq: Four times a day (QID) | ORAL | Status: AC | PRN

## 2012-01-21 MED ORDER — POLYETHYLENE GLYCOL 3350 17 GM/SCOOP PO POWD: 17.0000 g | Freq: Every day | ORAL | Status: AC

## 2012-01-21 MED ORDER — ALLOPURINOL 100 MG PO TABS: 200.0000 mg | ORAL_TABLET | Freq: Every day | ORAL | Status: DC

## 2012-01-21 NOTE — ED Provider Notes (Signed)
History     CSN: [id number]  Arrival date & time 01/21/12  1553   First MD Initiated Contact with Patient 01/21/12 1613      Chief Complaint  Patient presents with  . Joint Pain    (Consider location/radiation/quality/duration/timing/severity/associated sxs/prior treatment) HPI Comments: Started with his right toe that swollen and very painful just by walking on it. Then his right thumb became swollen warm and hurting just with movement. He has had gout before but only on his feet has never had it on his hand before" ( relative described). Patient describes on further and direct questioning that he has no abdominal pain. " I'm  just constipated ", patient denies any fevers, rectal bleeding, or vomiting. Patient denies any shortness of breath or chest pains. Patient reports his last bowel movement he believes was about 2 days ago.  The history is provided by the patient and a relative.    Past Medical History  Diagnosis Date  . ASTHMA 04/14/2007  . COPD 04/14/2007  . DIABETES MELLITUS, TYPE II 04/14/2007  . HYPERTENSION 04/14/2007  . LEG PAIN, RIGHT 05/19/2008  . LOW BACK PAIN 04/14/2007  . PANCREATITIS, HX OF 04/14/2007  . PNEUMONIA, HX OF 04/14/2007  . SEIZURE DISORDER 04/14/2007  . Coronary artery disease     Past Surgical History  Procedure Date  . Coronary stent placement   . Esophagogastroduodenoscopy 12/21/2011    Procedure: ESOPHAGOGASTRODUODENOSCOPY (EGD);  Surgeon: Louis Meckel, MD;  Location: [facility] ENDOSCOPY;  Service: Endoscopy;  Laterality: N/A;    Family History  Problem Relation Age of Onset  . Heart disease Sister   . Heart disease Brother   . Heart disease Brother   . Heart disease Brother   . Heart disease Brother     History  Substance Use Topics  . Smoking status: Current Everyday Smoker -- 0.2 packs/day for 30 years    Types: Cigarettes  . Smokeless tobacco: Never Used  . Alcohol Use: No     former      Review of Systems  Constitutional:  Negative for fever, chills, activity change and fatigue.  Gastrointestinal: Positive for constipation. Negative for nausea, vomiting, abdominal pain, diarrhea, blood in stool, abdominal distention, anal bleeding and rectal pain.  Musculoskeletal: Positive for joint swelling. Negative for myalgias, arthralgias and gait problem.    Allergies  Review of patient's allergies indicates no known allergies.  Home Medications   Current Outpatient Rx  Name Route Sig Dispense Refill  . ACETAMINOPHEN-CODEINE #3 300-30 MG PO TABS Oral Take 1-2 tablets by mouth every 6 (six) hours as needed for pain. 15 tablet 0  . ALBUTEROL 90 MCG/ACT IN AERS Inhalation Inhale 2 puffs into the lungs 2 (two) times daily as needed. 17 g 6  . ALLOPURINOL 100 MG PO TABS Oral Take 2 tablets (200 mg total) by mouth daily. 15 tablet 0  . CYCLOBENZAPRINE HCL 10 MG PO TABS Oral Take 1 tablet (10 mg total) by mouth at bedtime. 30 tablet 5  . FLUTICASONE-SALMETEROL 250-50 MCG/DOSE IN AEPB Inhalation Inhale 1 puff into the lungs every 12 (twelve) hours. 60 each 6  . METFORMIN HCL 500 MG PO TABS Oral Take 1 tablet (500 mg total) by mouth 2 (two) times daily with a meal. 180 tablet 6  . MONTELUKAST SODIUM 10 MG PO TABS Oral Take 1 tablet (10 mg total) by mouth at bedtime. 90 tablet 1  . OMEPRAZOLE 20 MG PO CPDR Oral Take 1 capsule (20 mg total) by  mouth 2 (two) times daily. 28 capsule 3  . POLYETHYLENE GLYCOL 3350 PO POWD Oral Take 17 g by mouth daily. 255 g 3  . TRIAMCINOLONE ACETONIDE 0.1 % EX CREA Topical Apply topically 2 (two) times daily. 120 g 2    BP 121/81  Pulse 89  Temp(Src) 98.4 F (36.9 C) (Oral)  Resp 16  SpO2 97%  Physical Exam  Nursing note and vitals reviewed. Constitutional: No distress.  Abdominal: Soft. Bowel sounds are normal. He exhibits no distension and no mass. There is no tenderness. There is no rebound and no guarding.  Musculoskeletal: He exhibits edema and tenderness.       Hands:      Right  foot: He exhibits decreased range of motion, tenderness and swelling. He exhibits normal capillary refill, no crepitus, no deformity and no laceration.       Feet:  Neurological: He is alert.  Skin: Skin is warm. He is not diaphoretic. There is erythema.    ED Course  Procedures (including critical care time)  Labs Reviewed - No data to display No results found.   1. Gout   2. Constipation       MDM  Patient multiple medical problems being followed by a primary care Dr. Collier Flowers today to urgent care complaining of new lead develop right toe pain and swelling along with right base of thumb metacarpophalangeal first joints in both extremities. Consistent with gout. Patient has a significant history of gastrointestinal bleedings we'll stay away from and stays in have recommended patient's family members well to try to prevent the use of any over-the-counter Motrin, approximately equivalent medicines. Centrally patient describes been having problems going to the bathroom somewhat constipated his last bowel movement about 2 days ago. His abdomen is soft is nondistended. Have recommended use of MiraLax. Discuss with relatives at he needs a followup with primary care Dr. for both complaints. They seem to be very aware what symptoms will warrant further evaluation in the emergency department including abdominal pain or bleeding.        Jimmie Molly, MD 01/21/12 1721

## 2012-01-21 NOTE — Discharge Instructions (Signed)
   You have to follow up with your Dr. early next week for your gout as you probably will need to be started on this medication chronically. Have also printed some materials about things to watch for in your diet as they can trigger more gout. Take miralax ( every night) to help you have normal bowel movements.  Constipation in Adults Constipation is having fewer than 2 bowel movements per week. Usually, the stools are hard. As we grow older, constipation is more common. If you try to fix constipation with laxatives, the problem may get worse. This is because laxatives taken over a long period of time make the colon muscles weaker. A low-fiber diet, not taking in enough fluids, and taking some medicines may make these problems worse. MEDICATIONS THAT MAY CAUSE CONSTIPATION  Water pills (diuretics).   Calcium channel blockers (used to control blood pressure and for the heart).   Certain pain medicines (narcotics).   Anticholinergics.   Anti-inflammatory agents.   Antacids that contain aluminum.  DISEASES THAT CONTRIBUTE TO CONSTIPATION  Diabetes.   Parkinson's disease.   Dementia.   Stroke.   Depression.   Illnesses that cause problems with salt and water metabolism.  HOME CARE INSTRUCTIONS   Constipation is usually best cared for without medicines. Increasing dietary fiber and eating more fruits and vegetables is the best way to manage constipation.   Slowly increase fiber intake to 25 to 38 grams per day. Whole grains, fruits, vegetables, and legumes are good sources of fiber. A dietitian can further help you incorporate high-fiber foods into your diet.   Drink enough water and fluids to keep your urine clear or pale yellow.   A fiber supplement may be added to your diet if you cannot get enough fiber from foods.   Increasing your activities also helps improve regularity.   Suppositories, as suggested by your caregiver, will also help. If you are using antacids, such as  aluminum or calcium containing products, it will be helpful to switch to products containing magnesium if your caregiver says it is okay.   If you have been given a liquid injection (enema) today, this is only a temporary measure. It should not be relied on for treatment of longstanding (chronic) constipation.   Stronger measures, such as magnesium sulfate, should be avoided if possible. This may cause uncontrollable diarrhea. Using magnesium sulfate may not allow you time to make it to the bathroom.  SEEK IMMEDIATE MEDICAL CARE IF:   There is bright red blood in the stool.   The constipation stays for more than 4 days.   There is belly (abdominal) or rectal pain.   You do not seem to be getting better.   You have any questions or concerns.  MAKE SURE YOU:   Understand these instructions.   Will watch your condition.   Will get help right away if you are not doing well or get worse.  Document Released: 05/09/2004 Document Revised: 07/31/2011 Document Reviewed: 07/15/2011 [facility] Patient Information 2012 [place], [place].

## 2012-01-21 NOTE — ED Notes (Addendum)
Recent admit to [facility] because of seizure activity; passed out and had seizure due to low hemoglobin. Now  Has abdominal pain, low back pain, constipated, feet and hands swollen After speaking w family member to clarify c/o, patient only has pain and swelling  in hands and feet

## 2012-01-22 ENCOUNTER — Telehealth: Payer: Self-pay | Admitting: *Deleted

## 2012-01-22 NOTE — Telephone Encounter (Signed)
To urgent care yesterday for gout-- to make follow up appointment with md--wants to come in next Tuesday am--dr k

## 2012-01-22 NOTE — Telephone Encounter (Signed)
Pt is sch for 01-27-2012 815am ok'd by wife

## 2012-01-27 ENCOUNTER — Encounter: Payer: Self-pay | Admitting: Internal Medicine

## 2012-01-27 ENCOUNTER — Ambulatory Visit (INDEPENDENT_AMBULATORY_CARE_PROVIDER_SITE_OTHER): Payer: Medicare Other | Admitting: Internal Medicine

## 2012-01-27 VITALS — BP 106/70 | Temp 98.0°F | Wt 196.0 lb

## 2012-01-27 DIAGNOSIS — I10 Essential (primary) hypertension: Secondary | ICD-10-CM

## 2012-01-27 DIAGNOSIS — D62 Acute posthemorrhagic anemia: Secondary | ICD-10-CM

## 2012-01-27 DIAGNOSIS — M109 Gout, unspecified: Secondary | ICD-10-CM

## 2012-01-27 DIAGNOSIS — E119 Type 2 diabetes mellitus without complications: Secondary | ICD-10-CM

## 2012-01-27 LAB — HM DIABETES FOOT EXAM

## 2012-01-27 LAB — GLUCOSE, POCT (MANUAL RESULT ENTRY): POC Glucose: 73 mg/dl (ref 70–99)

## 2012-01-27 NOTE — Progress Notes (Signed)
  Subjective:    Patient ID: Luis Carter, male    DOB: 03-14-1946, 66 y.o.   MRN: [id number]  HPI  66 year old patient who is seen today following a hospital discharge approximately one week ago. He was in there for overnight observation following an episode of hematemesis. He had been taken naproxen for an acute gouty attack involving his left great toe. Since his discharge he has done well. Is also seen today for followup of diabetes. He has hypertension asthma and ongoing tobacco use. He has a history of substance abuse. He has noted no further hematemesis or melena and in general feels well. Is gout involving his left great toe has largely resolved he now is on allopurinol. Naproxen has been discontinued Hospital records reviewed    Review of Systems  Constitutional: Negative for fever, chills, appetite change and fatigue.  HENT: Negative for hearing loss, ear pain, congestion, sore throat, trouble swallowing, neck stiffness, dental problem, voice change and tinnitus.   Eyes: Negative for pain, discharge and visual disturbance.  Respiratory: Negative for cough, chest tightness, wheezing and stridor.   Cardiovascular: Negative for chest pain, palpitations and leg swelling.  Gastrointestinal: Negative for nausea, vomiting, abdominal pain, diarrhea, constipation, blood in stool and abdominal distention.  Genitourinary: Negative for urgency, hematuria, flank pain, discharge, difficulty urinating and genital sores.  Musculoskeletal: Negative for myalgias, back pain, joint swelling, arthralgias and gait problem.  Skin: Negative for rash.  Neurological: Negative for dizziness, syncope, speech difficulty, weakness, numbness and headaches.  Hematological: Negative for adenopathy. Does not bruise/bleed easily.  Psychiatric/Behavioral: Negative for behavioral problems and dysphoric mood. The patient is not nervous/anxious.        Objective:   Physical Exam  Constitutional: He is oriented to  person, place, and time. He appears well-developed.  HENT:  Head: Normocephalic.  Right Ear: External ear normal.  Left Ear: External ear normal.  Eyes: Conjunctivae and EOM are normal.  Neck: Normal range of motion.  Cardiovascular: Normal rate and normal heart sounds.   Pulmonary/Chest: Effort normal.       A few scattered rhonchi and faint wheezing  Abdominal: Bowel sounds are normal.  Musculoskeletal: Normal range of motion. He exhibits edema. He exhibits no tenderness.       Mild tenderness and resolving soft tissue swelling about the left first MTP joint  Neurological: He is alert and oriented to person, place, and time.  Psychiatric: He has a normal mood and affect. His behavior is normal.          Assessment & Plan:   Diabetes mellitus. We'll check a hemoglobin A1c Hypertension stable Status post acute gouty arthritis History of hematemesis. We'll check a followup CBC  Total cessation of smoking encouraged Recheck 3 months

## 2012-01-27 NOTE — Patient Instructions (Signed)
Limit your sodium (Salt) intake   Please check your hemoglobin A1c every 3 months  Smoking tobacco is very bad for your health. You should stop smoking immediately. 

## 2012-02-02 ENCOUNTER — Ambulatory Visit: Payer: Medicare Other | Admitting: Gastroenterology

## 2012-02-04 ENCOUNTER — Other Ambulatory Visit: Payer: Self-pay | Admitting: Family

## 2012-02-05 ENCOUNTER — Other Ambulatory Visit: Payer: Self-pay | Admitting: Internal Medicine

## 2012-02-05 MED ORDER — ALLOPURINOL 100 MG PO TABS: 200.0000 mg | ORAL_TABLET | Freq: Every day | ORAL | Status: DC

## 2012-02-05 NOTE — Telephone Encounter (Signed)
Pt needs refill on allopurinol call into [facility] [place]

## 2012-02-06 NOTE — ED Provider Notes (Signed)
Medical screening examination/treatment/procedure(s) were conducted as a shared visit with non-physician practitioner(s) and myself.  I personally evaluated the patient during the encounter    Shelda Jakes, MD 02/06/12 [phone]

## 2012-03-17 ENCOUNTER — Other Ambulatory Visit: Payer: Self-pay | Admitting: Internal Medicine

## 2012-04-08 ENCOUNTER — Ambulatory Visit: Payer: Medicare Other | Admitting: Internal Medicine

## 2012-04-28 ENCOUNTER — Encounter: Payer: Self-pay | Admitting: Internal Medicine

## 2012-04-28 ENCOUNTER — Ambulatory Visit (INDEPENDENT_AMBULATORY_CARE_PROVIDER_SITE_OTHER): Payer: Medicare Other | Admitting: Internal Medicine

## 2012-04-28 VITALS — BP 130/80

## 2012-04-28 DIAGNOSIS — J4489 Other specified chronic obstructive pulmonary disease: Secondary | ICD-10-CM

## 2012-04-28 DIAGNOSIS — I10 Essential (primary) hypertension: Secondary | ICD-10-CM

## 2012-04-28 DIAGNOSIS — J449 Chronic obstructive pulmonary disease, unspecified: Secondary | ICD-10-CM

## 2012-04-28 DIAGNOSIS — E119 Type 2 diabetes mellitus without complications: Secondary | ICD-10-CM

## 2012-04-28 LAB — HEMOGLOBIN A1C: Hgb A1c MFr Bld: 6.5 % (ref 4.6–6.5)

## 2012-04-28 MED ORDER — OMEPRAZOLE 20 MG PO CPDR: 20.0000 mg | DELAYED_RELEASE_CAPSULE | Freq: Two times a day (BID) | ORAL | Status: DC

## 2012-04-28 MED ORDER — FLUTICASONE-SALMETEROL 250-50 MCG/DOSE IN AEPB: 1.0000 | INHALATION_SPRAY | Freq: Two times a day (BID) | RESPIRATORY_TRACT | Status: DC

## 2012-04-28 MED ORDER — METFORMIN HCL 500 MG PO TABS: 500.0000 mg | ORAL_TABLET | Freq: Two times a day (BID) | ORAL | Status: DC

## 2012-04-28 MED ORDER — CYCLOBENZAPRINE HCL 10 MG PO TABS: 10.0000 mg | ORAL_TABLET | Freq: Every day | ORAL | Status: DC

## 2012-04-28 MED ORDER — ALLOPURINOL 100 MG PO TABS: 200.0000 mg | ORAL_TABLET | Freq: Every day | ORAL | Status: DC

## 2012-04-28 MED ORDER — MONTELUKAST SODIUM 10 MG PO TABS: 10.0000 mg | ORAL_TABLET | Freq: Every day | ORAL | Status: DC

## 2012-04-28 NOTE — Progress Notes (Signed)
Subjective:    Patient ID: Luis Carter, male    DOB: [birth year], 66 y.o.   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 He is seen today for his three-month followup. He complains of cold symptoms for several days but his chief complaint is right shoulder pain and stiffness for a couple of weeks. There's been no trauma. No home blood sugar monitoring. No recent hemoglobin A 1C  Past Medical History  Diagnosis Date  . ASTHMA 04/14/2007  . COPD 04/14/2007  . DIABETES MELLITUS, TYPE II 04/14/2007  . HYPERTENSION 04/14/2007  . LEG PAIN, RIGHT 05/19/2008  . LOW BACK PAIN 04/14/2007  . PANCREATITIS, HX OF 04/14/2007  . PNEUMONIA, HX OF 04/14/2007  . SEIZURE DISORDER 04/14/2007  . Coronary artery disease     History   Social History  . Marital Status: Married    Spouse Name: N/A    Number of Children: N/A  . Years of Education: N/A   Occupational History  . Not on file.   Social History Main Topics  . Smoking status: Current Everyday Smoker -- 0.2 packs/day for 30 years    Types: Cigarettes  . Smokeless tobacco: Never Used  . Alcohol Use: No     former  . Drug Use: No  . Sexually Active: Not on file   Other Topics Concern  . Not on file   Social History Narrative  . No narrative on file    Past Surgical History  Procedure Date  . Coronary stent placement   . Esophagogastroduodenoscopy 12/21/2011    Procedure: ESOPHAGOGASTRODUODENOSCOPY (EGD);  Surgeon: Louis Meckel, MD;  Location: [facility] ENDOSCOPY;  Service: Endoscopy;  Laterality: N/A;    Family History  Problem Relation Age of Onset  . Heart disease Sister   . Heart disease Brother   . Heart disease Brother   . Heart disease Brother   . Heart disease Brother     No Known Allergies  Current Outpatient Prescriptions on File Prior to Visit  Medication Sig Dispense Refill  . albuterol (PROVENTIL,VENTOLIN) 90 MCG/ACT inhaler Inhale 2 puffs into  the lungs 2 (two) times daily as needed.  17 g  6  . allopurinol (ZYLOPRIM) 100 MG tablet Take 2 tablets (200 mg total) by mouth daily.  60 tablet  3  . cyclobenzaprine (FLEXERIL) 10 MG tablet Take 1 tablet (10 mg total) by mouth at bedtime.  30 tablet  5  . Fluticasone-Salmeterol (ADVAIR DISKUS) 250-50 MCG/DOSE AEPB Inhale 1 puff into the lungs every 12 (twelve) hours.  60 each  6  . metFORMIN (GLUCOPHAGE) 500 MG tablet Take 1 tablet (500 mg total) by mouth 2 (two) times daily with a meal.  180 tablet  6  . montelukast (SINGULAIR) 10 MG tablet TAKE 1 TABLET BY MOUTH AT BEDTIME  90 tablet  3  . omeprazole (PRILOSEC) 20 MG capsule Take 1 capsule (20 mg total) by mouth 2 (two) times daily.  28 capsule  3  . triamcinolone cream (KENALOG) 0.1 % Apply topically 2 (two) times daily.  120 g  2    BP 130/80       Review of Systems  Constitutional: Negative for fever, chills, appetite change and fatigue.  HENT: Negative for hearing loss, ear pain, congestion, sore throat, trouble swallowing, neck stiffness, dental problem, voice change and tinnitus.   Eyes: Negative for pain, discharge and visual disturbance.  Respiratory: Positive for shortness  of breath and wheezing. Negative for cough, chest tightness and stridor.   Cardiovascular: Negative for chest pain, palpitations and leg swelling.  Gastrointestinal: Negative for nausea, vomiting, abdominal pain, diarrhea, constipation, blood in stool and abdominal distention.  Genitourinary: Negative for urgency, hematuria, flank pain, discharge, difficulty urinating and genital sores.  Musculoskeletal: Positive for arthralgias. Negative for myalgias, back pain, joint swelling and gait problem.  Skin: Negative for rash.  Neurological: Negative for dizziness, syncope, speech difficulty, weakness, numbness and headaches.  Hematological: Negative for adenopathy. Does not bruise/bleed easily.  Psychiatric/Behavioral: Negative for behavioral problems and  dysphoric mood. The patient is not nervous/anxious.        Objective:   Physical Exam  Constitutional: He is oriented to person, place, and time. He appears well-developed.  HENT:  Head: Normocephalic.  Right Ear: External ear normal.  Left Ear: External ear normal.  Eyes: Conjunctivae and EOM are normal.  Neck: Normal range of motion.  Cardiovascular: Normal rate and normal heart sounds.   Pulmonary/Chest: He has wheezes.  Abdominal: Bowel sounds are normal.  Musculoskeletal: Normal range of motion. He exhibits no edema and no tenderness.  Neurological: He is alert and oriented to person, place, and time.  Psychiatric: He has a normal mood and affect. His behavior is normal.          Assessment & Plan:   Hypertension stable  and diabetes mellitus. normal random blood sugar. We'll check a hemoglobin A1c COPD medications refilled Right shoulder pain. Will treat with Depo-Medrol followed by anti-inflammatory medications as needed. Will call if unimproved

## 2012-04-28 NOTE — Patient Instructions (Signed)
Limit your sodium (Salt) intake   Please check your hemoglobin A1c every 3 months  Call or return to clinic prn if these symptoms worsen or fail to improve as anticipated.   

## 2012-05-04 ENCOUNTER — Other Ambulatory Visit: Payer: Self-pay | Admitting: Internal Medicine

## 2012-05-04 MED ORDER — ALBUTEROL SULFATE HFA 108 (90 BASE) MCG/ACT IN AERS: 2.0000 | INHALATION_SPRAY | Freq: Four times a day (QID) | RESPIRATORY_TRACT | Status: DC | PRN

## 2012-05-04 NOTE — Telephone Encounter (Signed)
Pt is needing a refill of albuterol (PROVENTIL,VENTOLIN) 90 MCG/ACT inhaler called in to [facility] on [address]. Pt is completely out of med. Req that this med be called in today. Pt aware that it normally takes 3 business days for refills.

## 2012-05-04 NOTE — Telephone Encounter (Signed)
done

## 2012-05-14 ENCOUNTER — Encounter (HOSPITAL_COMMUNITY): Payer: Self-pay | Admitting: Emergency Medicine

## 2012-05-14 ENCOUNTER — Emergency Department (INDEPENDENT_AMBULATORY_CARE_PROVIDER_SITE_OTHER)
Admission: EM | Admit: 2012-05-14 | Discharge: 2012-05-14 | Disposition: A | Discharge status: Home / Self Care | Payer: PRIVATE HEALTH INSURANCE | Source: Home / Self Care | Attending: Emergency Medicine | Admitting: Emergency Medicine

## 2012-05-14 DIAGNOSIS — M109 Gout, unspecified: Secondary | ICD-10-CM

## 2012-05-14 HISTORY — DX: Gout, unspecified: M10.9

## 2012-05-14 HISTORY — DX: Gastrointestinal hemorrhage, unspecified: K92.2

## 2012-05-14 MED ORDER — DICLOFENAC SODIUM 1 % TD GEL: 1.0000 "application " | Freq: Four times a day (QID) | TRANSDERMAL | Status: DC

## 2012-05-14 MED ORDER — METHYLPREDNISOLONE SODIUM SUCC 125 MG IJ SOLR
125.0000 mg | Freq: Once | INTRAMUSCULAR | Status: AC
  Administered 2012-05-14: 125 mg via INTRAMUSCULAR

## 2012-05-14 MED ORDER — COLCHICINE 0.6 MG PO TABS: ORAL_TABLET | ORAL | Status: DC

## 2012-05-14 MED ORDER — HYDROCODONE-ACETAMINOPHEN 5-325 MG PO TABS: 2.0000 | ORAL_TABLET | ORAL | Status: DC | PRN

## 2012-05-14 MED ORDER — METHYLPREDNISOLONE SODIUM SUCC 125 MG IJ SOLR
INTRAMUSCULAR | Status: AC
  Filled 2012-05-14: qty 2

## 2012-05-14 NOTE — ED Provider Notes (Signed)
History     CSN: [id number]  Arrival date & time 05/14/12  2440   First MD Initiated Contact with Patient 05/14/12 [phone]      Chief Complaint  Patient presents with  . Gout    (Consider location/radiation/quality/duration/timing/severity/associated sxs/prior treatment) HPI Comments: Pt with h/o gout reports recurrent pain, swelling, hyperaesthesias, in bilateral 1st MTP starting 2 days ago. No recent trauma. No N/V, fevers, numbness, parasthesias. States glucose is running WNL for him. States feels identical to previous episodes of gout. States that he "ran out" of his allopurinol and has not been taking it.   ROS as noted in HPI. All other ROS negative.   Patient is a 66 y.o. male presenting with lower extremity pain. The history is provided by the patient. No language interpreter was used.  Foot Pain This is a recurrent problem. The current episode started 2 days ago. The problem occurs constantly. The problem has been gradually worsening. The symptoms are aggravated by walking. Nothing relieves the symptoms. He has tried nothing for the symptoms. The treatment provided no relief.    Past Medical History  Diagnosis Date  . ASTHMA 04/14/2007  . COPD 04/14/2007  . DIABETES MELLITUS, TYPE II 04/14/2007  . HYPERTENSION 04/14/2007  . LEG PAIN, RIGHT 05/19/2008  . LOW BACK PAIN 04/14/2007  . PANCREATITIS, HX OF 04/14/2007  . PNEUMONIA, HX OF 04/14/2007  . SEIZURE DISORDER 04/14/2007  . Coronary artery disease   . Gout   . GI bleed     Past Surgical History  Procedure Date  . Coronary stent placement   . Esophagogastroduodenoscopy 12/21/2011    Procedure: ESOPHAGOGASTRODUODENOSCOPY (EGD);  Surgeon: Louis Meckel, MD;  Location: [facility] ENDOSCOPY;  Service: Endoscopy;  Laterality: N/A;    Family History  Problem Relation Age of Onset  . Heart disease Sister   . Heart disease Brother   . Heart disease Brother   . Heart disease Brother   . Heart disease Brother     History    Substance Use Topics  . Smoking status: Current Every Day Smoker -- 0.2 packs/day for 30 years    Types: Cigarettes  . Smokeless tobacco: Never Used  . Alcohol Use: No     former      Review of Systems  Allergies  Review of patient's allergies indicates no known allergies.  Home Medications   Current Outpatient Rx  Name Route Sig Dispense Refill  . ALBUTEROL SULFATE HFA 108 (90 BASE) MCG/ACT IN AERS Inhalation Inhale 2 puffs into the lungs every 6 (six) hours as needed for wheezing. 3 Inhaler 5  . ALLOPURINOL 100 MG PO TABS Oral Take 2 tablets (200 mg total) by mouth daily. 60 tablet 3  . COLCHICINE 0.6 MG PO TABS  2 tabs po x 1, then one tab po 1 hour later 6 tablet 0  . CYCLOBENZAPRINE HCL 10 MG PO TABS Oral Take 1 tablet (10 mg total) by mouth at bedtime. 30 tablet 5  . DICLOFENAC SODIUM 1 % TD GEL Topical Apply 1 application topically 4 (four) times daily. 100 g 0  . FLUTICASONE-SALMETEROL 250-50 MCG/DOSE IN AEPB Inhalation Inhale 1 puff into the lungs every 12 (twelve) hours. 60 each 6  . HYDROCODONE-ACETAMINOPHEN 5-325 MG PO TABS Oral Take 2 tablets by mouth every 4 (four) hours as needed for pain. 20 tablet 0  . METFORMIN HCL 500 MG PO TABS Oral Take 1 tablet (500 mg total) by mouth 2 (two) times daily with a meal.  180 tablet 6  . MONTELUKAST SODIUM 10 MG PO TABS Oral Take 1 tablet (10 mg total) by mouth at bedtime. 90 tablet 3  . OMEPRAZOLE 20 MG PO CPDR Oral Take 1 capsule (20 mg total) by mouth 2 (two) times daily. 28 capsule 3  . TRIAMCINOLONE ACETONIDE 0.1 % EX CREA Topical Apply topically 2 (two) times daily. 120 g 2    BP 136/77  Pulse 86  Temp 98 F (36.7 C) (Oral)  Resp 20  SpO2 97%  Physical Exam  Nursing note and vitals reviewed. Constitutional: He is oriented to person, place, and time. He appears well-developed and well-nourished.  HENT:  Head: Normocephalic and atraumatic.  Eyes: Conjunctivae normal and EOM are normal.  Neck: Normal range of motion.   Cardiovascular: Normal rate.   Pulmonary/Chest: Effort normal. No respiratory distress.  Abdominal: He exhibits no distension.  Musculoskeletal: Normal range of motion.       Bilateral erythema, tenderness 1st MTP. No signs of trauma. Skin intact, sensation grossly intact. Pt able to move all toes, no other bony tenderness. No signs of infection. DP 2+.   Neurological: He is alert and oriented to person, place, and time. Coordination normal.  Skin: Skin is warm and dry.  Psychiatric: He has a normal mood and affect. His behavior is normal. Judgment and thought content normal.    ED Course  Procedures (including critical care time)  Labs Reviewed - No data to display No results found.   1. Gout     MDM  Previous records reviewed. As noted in history of present illness. Patient seen in urgent care earlier this year for right great toe pain, right base of thumb pain, swelling, thought to have gout. Has history of GI bleeding. NSAIDs deferred.,  Presentation c/w podagra. Doubt septic joint. No hx of fever, vitals normal.  given history GIB, deferring systemic nsaids. Giving solumedrol 125 mg IM. Home with colchicine, norco, topical diclofenac. F/u with PMD prn.   Luiz Blare, MD 05/14/12 1029

## 2012-05-14 NOTE — ED Notes (Signed)
Complains of Gout flare up

## 2012-07-28 ENCOUNTER — Ambulatory Visit (INDEPENDENT_AMBULATORY_CARE_PROVIDER_SITE_OTHER): Payer: PRIVATE HEALTH INSURANCE | Admitting: Internal Medicine

## 2012-07-28 ENCOUNTER — Encounter: Payer: Self-pay | Admitting: Internal Medicine

## 2012-07-28 VITALS — BP 130/80 | HR 76 | Temp 97.4°F | Resp 16 | Wt 198.0 lb

## 2012-07-28 DIAGNOSIS — E119 Type 2 diabetes mellitus without complications: Secondary | ICD-10-CM

## 2012-07-28 DIAGNOSIS — J45909 Unspecified asthma, uncomplicated: Secondary | ICD-10-CM

## 2012-07-28 DIAGNOSIS — I10 Essential (primary) hypertension: Secondary | ICD-10-CM

## 2012-07-28 DIAGNOSIS — Z23 Encounter for immunization: Secondary | ICD-10-CM

## 2012-07-28 MED ORDER — HYDROCODONE-ACETAMINOPHEN 5-325 MG PO TABS: 2.0000 | ORAL_TABLET | ORAL | Status: DC | PRN

## 2012-07-28 MED ORDER — ALBUTEROL SULFATE HFA 108 (90 BASE) MCG/ACT IN AERS: 2.0000 | INHALATION_SPRAY | Freq: Four times a day (QID) | RESPIRATORY_TRACT | Status: DC | PRN

## 2012-07-28 MED ORDER — FLUTICASONE-SALMETEROL 250-50 MCG/DOSE IN AEPB: 1.0000 | INHALATION_SPRAY | Freq: Two times a day (BID) | RESPIRATORY_TRACT | Status: DC

## 2012-07-28 MED ORDER — CYCLOBENZAPRINE HCL 10 MG PO TABS: 10.0000 mg | ORAL_TABLET | Freq: Every day | ORAL | Status: DC

## 2012-07-28 MED ORDER — OMEPRAZOLE 20 MG PO CPDR: 20.0000 mg | DELAYED_RELEASE_CAPSULE | Freq: Two times a day (BID) | ORAL | Status: DC

## 2012-07-28 MED ORDER — ALLOPURINOL 100 MG PO TABS: 200.0000 mg | ORAL_TABLET | Freq: Every day | ORAL | Status: DC

## 2012-07-28 MED ORDER — MONTELUKAST SODIUM 10 MG PO TABS: 10.0000 mg | ORAL_TABLET | Freq: Every day | ORAL | Status: DC

## 2012-07-28 MED ORDER — METFORMIN HCL 500 MG PO TABS: 500.0000 mg | ORAL_TABLET | Freq: Two times a day (BID) | ORAL | Status: DC

## 2012-07-28 NOTE — Progress Notes (Signed)
Subjective:    Patient ID: Luis Carter, male    DOB: Jul 27, 1946, 66 y.o.   MRN: [id number]  HPI  66 year old patient who is seen today for followup. He has a history of type 2 diabetes. He checks blood sugars at home and frequently but states they are well controlled. His last hemoglobin A1c was 6.5. He has a history of treated hypertension which has been stable and controlled today off medications. He has chronic asthma which has not done as well with the change in seasons. He has some chronic low-grade wheezing and remains on maintenance medications as well as albuterol rescue. He requires medication refills  Past Medical History  Diagnosis Date  . ASTHMA 04/14/2007  . COPD 04/14/2007  . DIABETES MELLITUS, TYPE II 04/14/2007  . HYPERTENSION 04/14/2007  . LEG PAIN, RIGHT 05/19/2008  . LOW BACK PAIN 04/14/2007  . PANCREATITIS, HX OF 04/14/2007  . PNEUMONIA, HX OF 04/14/2007  . SEIZURE DISORDER 04/14/2007  . Coronary artery disease   . Gout   . GI bleed     History   Social History  . Marital Status: Married    Spouse Name: N/A    Number of Children: N/A  . Years of Education: N/A   Occupational History  . Not on file.   Social History Main Topics  . Smoking status: Current Every Day Smoker -- 0.2 packs/day for 30 years    Types: Cigarettes  . Smokeless tobacco: Never Used  . Alcohol Use: No     Comment: former  . Drug Use: No  . Sexually Active: Not on file   Other Topics Concern  . Not on file   Social History Narrative  . No narrative on file    Past Surgical History  Procedure Date  . Coronary stent placement   . Esophagogastroduodenoscopy 12/21/2011    Procedure: ESOPHAGOGASTRODUODENOSCOPY (EGD);  Surgeon: Louis Meckel, MD;  Location: [facility] ENDOSCOPY;  Service: Endoscopy;  Laterality: N/A;    Family History  Problem Relation Age of Onset  . Heart disease Sister   . Heart disease Brother   . Heart disease Brother   . Heart disease Brother   . Heart  disease Brother     No Known Allergies  Current Outpatient Prescriptions on File Prior to Visit  Medication Sig Dispense Refill  . albuterol (PROVENTIL HFA;VENTOLIN HFA) 108 (90 BASE) MCG/ACT inhaler Inhale 2 puffs into the lungs every 6 (six) hours as needed for wheezing.  3 Inhaler  5  . allopurinol (ZYLOPRIM) 100 MG tablet Take 2 tablets (200 mg total) by mouth daily.  60 tablet  3  . colchicine 0.6 MG tablet 2 tabs po x 1, then one tab po 1 hour later  6 tablet  0  . cyclobenzaprine (FLEXERIL) 10 MG tablet Take 1 tablet (10 mg total) by mouth at bedtime.  30 tablet  5  . diclofenac sodium (VOLTAREN) 1 % GEL Apply 1 application topically 4 (four) times daily.  100 g  0  . Fluticasone-Salmeterol (ADVAIR DISKUS) 250-50 MCG/DOSE AEPB Inhale 1 puff into the lungs every 12 (twelve) hours.  60 each  6  . HYDROcodone-acetaminophen (NORCO/VICODIN) 5-325 MG per tablet Take 2 tablets by mouth every 4 (four) hours as needed for pain.  20 tablet  0  . metFORMIN (GLUCOPHAGE) 500 MG tablet Take 1 tablet (500 mg total) by mouth 2 (two) times daily with a meal.  180 tablet  6  . montelukast (SINGULAIR) 10 MG tablet  Take 1 tablet (10 mg total) by mouth at bedtime.  90 tablet  3  . omeprazole (PRILOSEC) 20 MG capsule Take 1 capsule (20 mg total) by mouth 2 (two) times daily.  28 capsule  3    BP 130/80  Pulse 76  Temp 97.4 F (36.3 C) (Oral)  Resp 16  Wt 198 lb (89.812 kg)       Review of Systems  Constitutional: Negative for fever, chills, appetite change and fatigue.  HENT: Negative for hearing loss, ear pain, congestion, sore throat, trouble swallowing, neck stiffness, dental problem, voice change and tinnitus.   Eyes: Negative for pain, discharge and visual disturbance.  Respiratory: Positive for shortness of breath and wheezing. Negative for cough, chest tightness and stridor.   Cardiovascular: Negative for chest pain, palpitations and leg swelling.  Gastrointestinal: Negative for nausea,  vomiting, abdominal pain, diarrhea, constipation, blood in stool and abdominal distention.  Genitourinary: Negative for urgency, hematuria, flank pain, discharge, difficulty urinating and genital sores.  Musculoskeletal: Positive for back pain and arthralgias. Negative for myalgias, joint swelling and gait problem.  Skin: Negative for rash.  Neurological: Negative for dizziness, syncope, speech difficulty, weakness, numbness and headaches.  Hematological: Negative for adenopathy. Does not bruise/bleed easily.  Psychiatric/Behavioral: Negative for behavioral problems and dysphoric mood. The patient is not nervous/anxious.        Objective:   Physical Exam  Constitutional: He is oriented to person, place, and time. He appears well-developed.       Blood pressure 120/78  HENT:  Head: Normocephalic.  Right Ear: External ear normal.  Left Ear: External ear normal.  Eyes: Conjunctivae normal and EOM are normal.  Neck: Normal range of motion.  Cardiovascular: Normal rate and normal heart sounds.   Pulmonary/Chest: No respiratory distress. He has wheezes.       Scattered diffuse faint wheezing  Abdominal: Bowel sounds are normal.  Musculoskeletal: Normal range of motion. He exhibits no edema and no tenderness.  Neurological: He is alert and oriented to person, place, and time.  Psychiatric: He has a normal mood and affect. His behavior is normal.          Assessment & Plan:   Hypertension well controlled off medication Diabetes mellitus. Will check a hemoglobin A1c this appears to be well controlled Chronic asthma. Will continue maintenance and albuterol rescue. Cessation of smoking encouraged  Recheck 3 months Medications refilled

## 2012-07-28 NOTE — Patient Instructions (Signed)
Limit your sodium (Salt) intake    It is important that you exercise regularly, at least 20 minutes 3 to 4 times per week.  If you develop chest pain or shortness of breath seek  medical attention.   Please check your hemoglobin A1c every 3 months  Smoking tobacco is very bad for your health. You should stop smoking immediately. 

## 2012-08-06 ENCOUNTER — Other Ambulatory Visit: Payer: Self-pay | Admitting: Internal Medicine

## 2012-10-19 ENCOUNTER — Other Ambulatory Visit: Payer: Self-pay | Admitting: Internal Medicine

## 2012-10-25 ENCOUNTER — Ambulatory Visit (INDEPENDENT_AMBULATORY_CARE_PROVIDER_SITE_OTHER): Payer: Medicaid Other | Admitting: Internal Medicine

## 2012-10-25 ENCOUNTER — Encounter: Payer: Self-pay | Admitting: Internal Medicine

## 2012-10-25 VITALS — BP 230/110 | HR 72 | Temp 97.5°F | Resp 20 | Wt 203.0 lb

## 2012-10-25 DIAGNOSIS — E119 Type 2 diabetes mellitus without complications: Secondary | ICD-10-CM

## 2012-10-25 DIAGNOSIS — F1410 Cocaine abuse, uncomplicated: Secondary | ICD-10-CM

## 2012-10-25 DIAGNOSIS — F1490 Cocaine use, unspecified, uncomplicated: Secondary | ICD-10-CM

## 2012-10-25 DIAGNOSIS — M545 Low back pain, unspecified: Secondary | ICD-10-CM

## 2012-10-25 DIAGNOSIS — K254 Chronic or unspecified gastric ulcer with hemorrhage: Secondary | ICD-10-CM

## 2012-10-25 DIAGNOSIS — I10 Essential (primary) hypertension: Secondary | ICD-10-CM

## 2012-10-25 DIAGNOSIS — J45909 Unspecified asthma, uncomplicated: Secondary | ICD-10-CM

## 2012-10-25 LAB — COMPREHENSIVE METABOLIC PANEL
AST: 28 U/L (ref 0–37)
Alkaline Phosphatase: 61 U/L (ref 39–117)
BUN: 12 mg/dL (ref 6–23)
Creatinine, Ser: 1.5 mg/dL (ref 0.4–1.5)
Glucose, Bld: 83 mg/dL (ref 70–99)
Total Bilirubin: 0.6 mg/dL (ref 0.3–1.2)

## 2012-10-25 LAB — CBC WITH DIFFERENTIAL/PLATELET
Basophils Absolute: 0.1 10*3/uL (ref 0.0–0.1)
Basophils Relative: 0.6 % (ref 0.0–3.0)
Eosinophils Absolute: 0.7 10*3/uL (ref 0.0–0.7)
Lymphocytes Relative: 26.6 % (ref 12.0–46.0)
MCHC: 32.3 g/dL (ref 30.0–36.0)
Neutrophils Relative %: 54.7 % (ref 43.0–77.0)
RBC: 4.7 Mil/uL (ref 4.22–5.81)
WBC: 7.9 10*3/uL (ref 4.5–10.5)

## 2012-10-25 MED ORDER — ALBUTEROL SULFATE HFA 108 (90 BASE) MCG/ACT IN AERS: 2.0000 | INHALATION_SPRAY | Freq: Four times a day (QID) | RESPIRATORY_TRACT | Status: DC | PRN

## 2012-10-25 MED ORDER — CYCLOBENZAPRINE HCL 10 MG PO TABS: 10.0000 mg | ORAL_TABLET | Freq: Every day | ORAL | Status: DC

## 2012-10-25 MED ORDER — DICLOFENAC SODIUM 1 % TD GEL: 1.0000 "application " | Freq: Four times a day (QID) | TRANSDERMAL | Status: DC

## 2012-10-25 MED ORDER — FLUTICASONE-SALMETEROL 250-50 MCG/DOSE IN AEPB: 1.0000 | INHALATION_SPRAY | Freq: Two times a day (BID) | RESPIRATORY_TRACT | Status: DC

## 2012-10-25 MED ORDER — MONTELUKAST SODIUM 10 MG PO TABS: 10.0000 mg | ORAL_TABLET | Freq: Every day | ORAL | Status: DC

## 2012-10-25 MED ORDER — OMEPRAZOLE 20 MG PO CPDR: DELAYED_RELEASE_CAPSULE | ORAL | Status: DC

## 2012-10-25 MED ORDER — METFORMIN HCL 500 MG PO TABS: 500.0000 mg | ORAL_TABLET | Freq: Two times a day (BID) | ORAL | Status: DC

## 2012-10-25 MED ORDER — ALLOPURINOL 100 MG PO TABS: 200.0000 mg | ORAL_TABLET | Freq: Every day | ORAL | Status: DC

## 2012-10-25 MED ORDER — COLCHICINE 0.6 MG PO TABS: ORAL_TABLET | ORAL | Status: DC

## 2012-10-25 MED ORDER — LISINOPRIL-HYDROCHLOROTHIAZIDE 20-25 MG PO TABS: 1.0000 | ORAL_TABLET | Freq: Every day | ORAL | Status: DC

## 2012-10-25 NOTE — Patient Instructions (Addendum)
Limit your sodium (Salt) intake  Smoking tobacco is very bad for your health. You should stop smoking immediately.  Return office visit one week  Please check your blood pressure on a regular basis.  If it is consistently greater than 150/90, please make an office appointment.

## 2012-10-25 NOTE — Progress Notes (Signed)
Subjective:    Patient ID: Luis Carter, male    DOB: 1945/10/20, 67 y.o.   MRN: [id number]  HPI 67 year old patient who has a history of treated hypertension. He was seen her today by ophthalmology and noted to have elevated blood pressure and referred here today. He has a history of hypertension but has not been treated in some time. Blood pressures previously have been normal. He has a history of substance abuse including cocaine but he denies any illicit drug use. Denies any chest pain or shortness of breath other than his chronic wheezing. He continues to smoke.  He does have coronary artery disease as well as type 2 diabetes.  On complaint today is the mild dizziness and some blurred vision.  Past Medical History  Diagnosis Date  . ASTHMA 04/14/2007  . COPD 04/14/2007  . DIABETES MELLITUS, TYPE II 04/14/2007  . HYPERTENSION 04/14/2007  . LEG PAIN, RIGHT 05/19/2008  . LOW BACK PAIN 04/14/2007  . PANCREATITIS, HX OF 04/14/2007  . PNEUMONIA, HX OF 04/14/2007  . SEIZURE DISORDER 04/14/2007  . Coronary artery disease   . Gout   . GI bleed     History   Social History  . Marital Status: Married    Spouse Name: N/A    Number of Children: N/A  . Years of Education: N/A   Occupational History  . Not on file.   Social History Main Topics  . Smoking status: Current Every Day Smoker -- 0.25 packs/day for 30 years    Types: Cigarettes  . Smokeless tobacco: Never Used  . Alcohol Use: No     Comment: former  . Drug Use: No  . Sexually Active: Not on file   Other Topics Concern  . Not on file   Social History Narrative  . No narrative on file    Past Surgical History  Procedure Laterality Date  . Coronary stent placement    . Esophagogastroduodenoscopy  12/21/2011    Procedure: ESOPHAGOGASTRODUODENOSCOPY (EGD);  Surgeon: Louis Meckel, MD;  Location: [facility] ENDOSCOPY;  Service: Endoscopy;  Laterality: N/A;    Family History  Problem Relation Age of Onset  . Heart disease  Sister   . Heart disease Brother   . Heart disease Brother   . Heart disease Brother   . Heart disease Brother     No Known Allergies  Current Outpatient Prescriptions on File Prior to Visit  Medication Sig Dispense Refill  . albuterol (PROVENTIL HFA;VENTOLIN HFA) 108 (90 BASE) MCG/ACT inhaler Inhale 2 puffs into the lungs every 6 (six) hours as needed for wheezing.  3 Inhaler  5  . allopurinol (ZYLOPRIM) 100 MG tablet Take 2 tablets (200 mg total) by mouth daily.  60 tablet  3  . colchicine 0.6 MG tablet 2 tabs po x 1, then one tab po 1 hour later  6 tablet  0  . cyclobenzaprine (FLEXERIL) 10 MG tablet Take 1 tablet (10 mg total) by mouth at bedtime.  30 tablet  5  . diclofenac sodium (VOLTAREN) 1 % GEL Apply 1 application topically 4 (four) times daily.  100 g  0  . Fluticasone-Salmeterol (ADVAIR DISKUS) 250-50 MCG/DOSE AEPB Inhale 1 puff into the lungs every 12 (twelve) hours.  60 each  6  . HYDROcodone-acetaminophen (NORCO/VICODIN) 5-325 MG per tablet Take 2 tablets by mouth every 4 (four) hours as needed for pain.  20 tablet  0  . metFORMIN (GLUCOPHAGE) 500 MG tablet Take 1 tablet (500 mg total) by  mouth 2 (two) times daily with a meal.  180 tablet  6  . montelukast (SINGULAIR) 10 MG tablet Take 1 tablet (10 mg total) by mouth at bedtime.  90 tablet  3  . naproxen (NAPROSYN) 500 MG tablet TAKE 1 TABLET BY MOUTH TWICE A DAY WITH MEALS  180 tablet  3  . omeprazole (PRILOSEC) 20 MG capsule TAKE ONE CAPSULE BY MOUTH TWICE A DAY  60 capsule  3   No current facility-administered medications on file prior to visit.    BP 230/110  Pulse 72  Temp(Src) 97.5 F (36.4 C) (Oral)  Resp 20  Wt 203 lb (92.08 kg)  BMI 30.87 kg/m2  SpO2 98%       Review of Systems  Constitutional: Negative for fever, chills, appetite change and fatigue.  HENT: Negative for hearing loss, ear pain, congestion, sore throat, trouble swallowing, neck stiffness, dental problem, voice change and tinnitus.    Eyes: Positive for visual disturbance. Negative for pain and discharge.  Respiratory: Negative for cough, chest tightness, wheezing and stridor.   Cardiovascular: Negative for chest pain, palpitations and leg swelling.  Gastrointestinal: Negative for nausea, vomiting, abdominal pain, diarrhea, constipation, blood in stool and abdominal distention.  Genitourinary: Negative for urgency, hematuria, flank pain, discharge, difficulty urinating and genital sores.  Musculoskeletal: Negative for myalgias, back pain, joint swelling, arthralgias and gait problem.  Skin: Negative for rash.  Neurological: Positive for dizziness. Negative for syncope, speech difficulty, weakness, numbness and headaches.  Hematological: Negative for adenopathy. Does not bruise/bleed easily.  Psychiatric/Behavioral: Negative for behavioral problems and dysphoric mood. The patient is not nervous/anxious.        Objective:   Physical Exam  Constitutional: He is oriented to person, place, and time. He appears well-developed.  Blood pressure 190/105  HENT:  Head: Normocephalic.  Right Ear: External ear normal.  Left Ear: External ear normal.  Eyes: Conjunctivae and EOM are normal.  Neck: Normal range of motion.  Cardiovascular: Normal rate and normal heart sounds.   Pulmonary/Chest: He has wheezes.  O2 saturation 98%  Abdominal: Bowel sounds are normal.  Musculoskeletal: Normal range of motion. He exhibits no edema and no tenderness.  Neurological: He is alert and oriented to person, place, and time.  Psychiatric: He has a normal mood and affect. His behavior is normal.          Assessment & Plan:   Hypertension. Will place on a combination of lisinopril hydrochlorothiazide. The patient was treated with Catapres 0.1 mg in the office Recheck 1 week  Diabetes mellitus. Will check a hemoglobin A1c Asthma. Continue inhalational medications  ATT:  Blood pressure down to 180/86 prior to his leaving the office

## 2012-10-26 ENCOUNTER — Encounter: Payer: Self-pay | Admitting: Internal Medicine

## 2012-10-26 LAB — DRUG SCREEN, URINE
Cocaine Metabolites: POSITIVE — AB
Creatinine,U: 146.98 mg/dL
Opiates: NEGATIVE
Phencyclidine (PCP): NEGATIVE

## 2012-10-27 ENCOUNTER — Ambulatory Visit: Payer: PRIVATE HEALTH INSURANCE | Admitting: Internal Medicine

## 2012-10-29 ENCOUNTER — Ambulatory Visit: Payer: PRIVATE HEALTH INSURANCE | Admitting: Internal Medicine

## 2012-11-01 ENCOUNTER — Ambulatory Visit: Payer: PRIVATE HEALTH INSURANCE | Admitting: Internal Medicine

## 2012-11-24 ENCOUNTER — Ambulatory Visit (INDEPENDENT_AMBULATORY_CARE_PROVIDER_SITE_OTHER): Payer: Medicaid Other | Admitting: Internal Medicine

## 2012-11-24 ENCOUNTER — Encounter: Payer: Self-pay | Admitting: Internal Medicine

## 2012-11-24 VITALS — BP 126/80 | HR 68 | Resp 18 | Wt 201.0 lb

## 2012-11-24 DIAGNOSIS — E119 Type 2 diabetes mellitus without complications: Secondary | ICD-10-CM

## 2012-11-24 DIAGNOSIS — F1410 Cocaine abuse, uncomplicated: Secondary | ICD-10-CM

## 2012-11-24 DIAGNOSIS — F1490 Cocaine use, unspecified, uncomplicated: Secondary | ICD-10-CM

## 2012-11-24 DIAGNOSIS — J449 Chronic obstructive pulmonary disease, unspecified: Secondary | ICD-10-CM

## 2012-11-24 DIAGNOSIS — I10 Essential (primary) hypertension: Secondary | ICD-10-CM

## 2012-11-24 DIAGNOSIS — J45909 Unspecified asthma, uncomplicated: Secondary | ICD-10-CM

## 2012-11-24 MED ORDER — LISINOPRIL-HYDROCHLOROTHIAZIDE 20-25 MG PO TABS: 1.0000 | ORAL_TABLET | Freq: Every day | ORAL | Status: DC

## 2012-11-24 MED ORDER — HYDROCODONE-HOMATROPINE 5-1.5 MG/5ML PO SYRP
5.0000 mL | ORAL_SOLUTION | Freq: Four times a day (QID) | ORAL | Status: AC | PRN
Start: 2012-11-24 — End: 2012-12-04

## 2012-11-24 MED ORDER — ALBUTEROL SULFATE HFA 108 (90 BASE) MCG/ACT IN AERS: 2.0000 | INHALATION_SPRAY | Freq: Four times a day (QID) | RESPIRATORY_TRACT | Status: DC | PRN

## 2012-11-24 NOTE — Patient Instructions (Signed)
Smoking tobacco is very bad for your health. You should stop smoking immediately.   Please check your hemoglobin A1c every 3 months    It is important that you exercise regularly, at least 20 minutes 3 to 4 times per week.  If you develop chest pain or shortness of breath seek  medical attention.

## 2012-11-24 NOTE — Progress Notes (Signed)
Subjective:    Patient ID: Luis Carter, male    DOB: [birth year], 67 y.o.   MRN: [id number]  HPI  67 year old patient who is seen today for followup of hypertension. He was seen one month ago with accelerated hypertension. Urine drug screen was positive for cocaine. Blood pressure today is 120/80 and apparently has been out of his blood pressure medication. His only complaint today is cough. He does have asthma COPD and ongoing tobacco use. Cough is nonproductive. He is on maintenance Advair and albuterol when necessary.  Past Medical History  Diagnosis Date  . ASTHMA 04/14/2007  . COPD 04/14/2007  . DIABETES MELLITUS, TYPE II 04/14/2007  . HYPERTENSION 04/14/2007  . LEG PAIN, RIGHT 05/19/2008  . LOW BACK PAIN 04/14/2007  . PANCREATITIS, HX OF 04/14/2007  . PNEUMONIA, HX OF 04/14/2007  . SEIZURE DISORDER 04/14/2007  . Coronary artery disease   . Gout   . GI bleed     History   Social History  . Marital Status: Married    Spouse Name: N/A    Number of Children: N/A  . Years of Education: N/A   Occupational History  . Not on file.   Social History Main Topics  . Smoking status: Current Every Day Smoker -- 0.25 packs/day for 30 years    Types: Cigarettes  . Smokeless tobacco: Never Used  . Alcohol Use: No     Comment: former  . Drug Use: No  . Sexually Active: Not on file   Other Topics Concern  . Not on file   Social History Narrative  . No narrative on file    Past Surgical History  Procedure Laterality Date  . Coronary stent placement    . Esophagogastroduodenoscopy  12/21/2011    Procedure: ESOPHAGOGASTRODUODENOSCOPY (EGD);  Surgeon: Louis Meckel, MD;  Location: [facility] ENDOSCOPY;  Service: Endoscopy;  Laterality: N/A;    Family History  Problem Relation Age of Onset  . Heart disease Sister   . Heart disease Brother   . Heart disease Brother   . Heart disease Brother   . Heart disease Brother     No Known Allergies  Current Outpatient Prescriptions on  File Prior to Visit  Medication Sig Dispense Refill  . albuterol (PROVENTIL HFA;VENTOLIN HFA) 108 (90 BASE) MCG/ACT inhaler Inhale 2 puffs into the lungs every 6 (six) hours as needed for wheezing.  3 Inhaler  5  . allopurinol (ZYLOPRIM) 100 MG tablet Take 2 tablets (200 mg total) by mouth daily.  60 tablet  3  . colchicine 0.6 MG tablet 2 tabs po x 1, then one tab po 1 hour later  6 tablet  0  . cyclobenzaprine (FLEXERIL) 10 MG tablet Take 1 tablet (10 mg total) by mouth at bedtime.  30 tablet  5  . diclofenac sodium (VOLTAREN) 1 % GEL Apply 1 application topically 4 (four) times daily.  100 g  0  . Fluticasone-Salmeterol (ADVAIR DISKUS) 250-50 MCG/DOSE AEPB Inhale 1 puff into the lungs every 12 (twelve) hours.  60 each  6  . lisinopril-hydrochlorothiazide (PRINZIDE,ZESTORETIC) 20-25 MG per tablet Take 1 tablet by mouth daily.  90 tablet  3  . metFORMIN (GLUCOPHAGE) 500 MG tablet Take 1 tablet (500 mg total) by mouth 2 (two) times daily with a meal.  180 tablet  6  . montelukast (SINGULAIR) 10 MG tablet Take 1 tablet (10 mg total) by mouth at bedtime.  90 tablet  3  . omeprazole (PRILOSEC) 20 MG capsule TAKE  ONE CAPSULE BY MOUTH TWICE A DAY  60 capsule  3   No current facility-administered medications on file prior to visit.    BP 126/80  Pulse 68  Resp 18  Wt 201 lb (91.173 kg)  BMI 30.57 kg/m2       Review of Systems  Constitutional: Negative for fever, chills, appetite change and fatigue.  HENT: Negative for hearing loss, ear pain, congestion, sore throat, trouble swallowing, neck stiffness, dental problem, voice change and tinnitus.   Eyes: Negative for pain, discharge and visual disturbance.  Respiratory: Positive for cough. Negative for chest tightness, wheezing and stridor.   Cardiovascular: Negative for chest pain, palpitations and leg swelling.  Gastrointestinal: Negative for nausea, vomiting, abdominal pain, diarrhea, constipation, blood in stool and abdominal distention.   Genitourinary: Negative for urgency, hematuria, flank pain, discharge, difficulty urinating and genital sores.  Musculoskeletal: Negative for myalgias, back pain, joint swelling, arthralgias and gait problem.  Skin: Negative for rash.  Neurological: Negative for dizziness, syncope, speech difficulty, weakness, numbness and headaches.  Hematological: Negative for adenopathy. Does not bruise/bleed easily.  Psychiatric/Behavioral: Negative for behavioral problems and dysphoric mood. The patient is not nervous/anxious.        Objective:   Physical Exam  Constitutional: He is oriented to person, place, and time. He appears well-developed.  Blood pressure 120/80  HENT:  Head: Normocephalic.  Right Ear: External ear normal.  Left Ear: External ear normal.  Eyes: Conjunctivae and EOM are normal.  Neck: Normal range of motion.  Cardiovascular: Normal rate and normal heart sounds.   Pulmonary/Chest: Effort normal. No respiratory distress. He has wheezes. He has no rales.  Abdominal: Bowel sounds are normal.  Musculoskeletal: Normal range of motion. He exhibits no edema and no tenderness.  Neurological: He is alert and oriented to person, place, and time.  Psychiatric: He has a normal mood and affect. His behavior is normal.          Assessment & Plan:   Hypertension well controlled Cocaine abuse. Discussed at length Diabetes mellitus stable Asthma. Smoking cessation encouraged samples of Advair dispensed medications refilled

## 2013-02-20 ENCOUNTER — Other Ambulatory Visit: Payer: Self-pay | Admitting: Internal Medicine

## 2013-02-23 ENCOUNTER — Ambulatory Visit (INDEPENDENT_AMBULATORY_CARE_PROVIDER_SITE_OTHER): Payer: PRIVATE HEALTH INSURANCE | Admitting: Internal Medicine

## 2013-02-23 ENCOUNTER — Encounter: Payer: Self-pay | Admitting: Internal Medicine

## 2013-02-23 VITALS — BP 122/80 | HR 71 | Temp 97.6°F | Resp 18 | Wt 190.0 lb

## 2013-02-23 DIAGNOSIS — J449 Chronic obstructive pulmonary disease, unspecified: Secondary | ICD-10-CM

## 2013-02-23 DIAGNOSIS — I10 Essential (primary) hypertension: Secondary | ICD-10-CM

## 2013-02-23 DIAGNOSIS — J4489 Other specified chronic obstructive pulmonary disease: Secondary | ICD-10-CM

## 2013-02-23 DIAGNOSIS — J45909 Unspecified asthma, uncomplicated: Secondary | ICD-10-CM

## 2013-02-23 DIAGNOSIS — E119 Type 2 diabetes mellitus without complications: Secondary | ICD-10-CM

## 2013-02-23 LAB — HEMOGLOBIN A1C: Hgb A1c MFr Bld: 6.7 % — ABNORMAL HIGH (ref 4.6–6.5)

## 2013-02-23 LAB — GLUCOSE, POCT (MANUAL RESULT ENTRY): POC Glucose: 68 mg/dl — AB (ref 70–99)

## 2013-02-23 MED ORDER — TRAMADOL HCL 50 MG PO TABS: 50.0000 mg | ORAL_TABLET | Freq: Three times a day (TID) | ORAL | Status: DC | PRN

## 2013-02-23 NOTE — Patient Instructions (Signed)
Limit your sodium (Salt) intake   Please check your hemoglobin A1c every 3 months    It is important that you exercise regularly, at least 20 minutes 3 to 4 times per week.  If you develop chest pain or shortness of breath seek  medical attention.\ Smoking tobacco is very bad for your health. You should stop smoking immediately.  Hip Bursitis Bursitis is a puffiness (swelling) and soreness of a fluid-filled sac (bursa). This sac covers and protects the joint. HOME CARE  Put ice on the injured area.  Put ice in a plastic bag.  Place a towel between your skin and the bag.  Leave the ice on for 15-20 minutes, 3-4 times a day.  Rest the painful joint as much as possible. Move your joint at least 4 times a day. When pain lessens, start normal, slow movements and normal activities.  Only take medicine as told by your doctor.  Use crutches as told.  Raise (elevate) your painful joint. Use pillows for propping your legs and hips.  Get a massage to lessen pain. GET HELP RIGHT AWAY IF:  Your pain increases or does not improve during treatment.  You have a fever.  You feel heat coming from the affected area.  You see redness and puffiness around the affected area.  You have any questions or concerns. MAKE SURE YOU:  Understand these instructions.  Will watch your condition.  Will get help right away if you are not well or get worse. Document Released: 09/13/2010 Document Revised: 11/03/2011 Document Reviewed: 09/13/2010 [facility] Patient Information 2014 [place], [place].

## 2013-02-23 NOTE — Progress Notes (Signed)
Subjective:    Patient ID: Luis Carter, male    DOB: 04-05-1946, 67 y.o.   MRN: [id number]  HPI  and-year-old patient who is seen today for followup. He has a history of type 2 diabetes which has been managed with metformin 500 mg daily. Random blood sugar today 68. He has treated hypertension. He has a history of accelerated hypertension and cocaine use. Blood pressure more recently has been well-controlled His only complaint today is left leg pain. He has had some low back and right leg pain in the past. Pain seems a maximal in the left lateral hip and anterior thigh area. He has a history of gout and COPD and asthma. He continues to smoke infrequently.  Past Medical History  Diagnosis Date  . ASTHMA 04/14/2007  . COPD 04/14/2007  . DIABETES MELLITUS, TYPE II 04/14/2007  . HYPERTENSION 04/14/2007  . LEG PAIN, RIGHT 05/19/2008  . LOW BACK PAIN 04/14/2007  . PANCREATITIS, HX OF 04/14/2007  . PNEUMONIA, HX OF 04/14/2007  . SEIZURE DISORDER 04/14/2007  . Coronary artery disease   . Gout   . GI bleed     History   Social History  . Marital Status: Married    Spouse Name: N/A    Number of Children: N/A  . Years of Education: N/A   Occupational History  . Not on file.   Social History Main Topics  . Smoking status: Current Every Day Smoker -- 0.25 packs/day for 30 years    Types: Cigarettes  . Smokeless tobacco: Never Used  . Alcohol Use: No     Comment: former  . Drug Use: No  . Sexually Active: Not on file   Other Topics Concern  . Not on file   Social History Narrative  . No narrative on file    Past Surgical History  Procedure Laterality Date  . Coronary stent placement    . Esophagogastroduodenoscopy  12/21/2011    Procedure: ESOPHAGOGASTRODUODENOSCOPY (EGD);  Surgeon: Louis Meckel, MD;  Location: [facility] ENDOSCOPY;  Service: Endoscopy;  Laterality: N/A;    Family History  Problem Relation Age of Onset  . Heart disease Sister   . Heart disease Brother   .  Heart disease Brother   . Heart disease Brother   . Heart disease Brother     No Known Allergies  Current Outpatient Prescriptions on File Prior to Visit  Medication Sig Dispense Refill  . albuterol (PROVENTIL HFA;VENTOLIN HFA) 108 (90 BASE) MCG/ACT inhaler Inhale 2 puffs into the lungs every 6 (six) hours as needed for wheezing.  3 Inhaler  5  . allopurinol (ZYLOPRIM) 100 MG tablet TAKE 2 TABLETS BY MOUTH EVERY DAY  60 tablet  5  . colchicine 0.6 MG tablet 2 tabs po x 1, then one tab po 1 hour later  6 tablet  0  . cyclobenzaprine (FLEXERIL) 10 MG tablet Take 1 tablet (10 mg total) by mouth at bedtime.  30 tablet  5  . diclofenac sodium (VOLTAREN) 1 % GEL Apply 1 application topically 4 (four) times daily.  100 g  0  . Fluticasone-Salmeterol (ADVAIR DISKUS) 250-50 MCG/DOSE AEPB Inhale 1 puff into the lungs every 12 (twelve) hours.  60 each  6  . lisinopril-hydrochlorothiazide (PRINZIDE,ZESTORETIC) 20-25 MG per tablet Take 1 tablet by mouth daily.  90 tablet  3  . metFORMIN (GLUCOPHAGE) 500 MG tablet Take 1 tablet (500 mg total) by mouth 2 (two) times daily with a meal.  180 tablet  6  .  montelukast (SINGULAIR) 10 MG tablet Take 1 tablet (10 mg total) by mouth at bedtime.  90 tablet  3  . omeprazole (PRILOSEC) 20 MG capsule TAKE ONE CAPSULE BY MOUTH TWICE A DAY  60 capsule  3   No current facility-administered medications on file prior to visit.    BP 122/80  Pulse 71  Temp(Src) 97.6 F (36.4 C) (Oral)  Resp 18  Wt 190 lb (86.183 kg)  BMI 28.9 kg/m2  SpO2 95%       Review of Systems  Constitutional: Negative for fever, chills, appetite change and fatigue.  HENT: Negative for hearing loss, ear pain, congestion, sore throat, trouble swallowing, neck stiffness, dental problem, voice change and tinnitus.   Eyes: Negative for pain, discharge and visual disturbance.  Respiratory: Negative for cough, chest tightness, wheezing and stridor.   Cardiovascular: Negative for chest pain,  palpitations and leg swelling.  Gastrointestinal: Negative for nausea, vomiting, abdominal pain, diarrhea, constipation, blood in stool and abdominal distention.  Genitourinary: Negative for urgency, hematuria, flank pain, discharge, difficulty urinating and genital sores.  Musculoskeletal: Positive for back pain and arthralgias. Negative for myalgias, joint swelling and gait problem.  Skin: Negative for rash.  Neurological: Negative for dizziness, syncope, speech difficulty, weakness, numbness and headaches.  Hematological: Negative for adenopathy. Does not bruise/bleed easily.  Psychiatric/Behavioral: Negative for behavioral problems and dysphoric mood. The patient is not nervous/anxious.        Objective:   Physical Exam  Constitutional: He is oriented to person, place, and time. He appears well-developed.  HENT:  Head: Normocephalic.  Right Ear: External ear normal.  Left Ear: External ear normal.  Eyes: Conjunctivae and EOM are normal.  Neck: Normal range of motion.  Cardiovascular: Normal rate and normal heart sounds.   Pulmonary/Chest: Effort normal.  Scattered coarse rhonchi  Abdominal: Bowel sounds are normal.  Musculoskeletal: He exhibits no edema and no tenderness.  Range of motion of the left hip did tend to aggravate discomfort  Neurological: He is alert and oriented to person, place, and time.  Psychiatric: He has a normal mood and affect. His behavior is normal.          Assessment & Plan:   Diabetes mellitus. Appears the well-controlled on metformin therapy. We'll check a hemoglobin A1c. We'll continue low dose metformin Hypertension well controlled. We'll continue present regimen Left leg pain. Probable component of left greater trochanteric bursitis we'll treat with tramadol and continue topical diclofenac COPD

## 2013-03-03 ENCOUNTER — Other Ambulatory Visit: Payer: Self-pay

## 2013-03-21 ENCOUNTER — Encounter (HOSPITAL_COMMUNITY): Payer: Self-pay | Admitting: Emergency Medicine

## 2013-03-21 ENCOUNTER — Emergency Department (HOSPITAL_COMMUNITY): Payer: PRIVATE HEALTH INSURANCE

## 2013-03-21 ENCOUNTER — Emergency Department (HOSPITAL_COMMUNITY)
Admission: EM | Admit: 2013-03-21 | Discharge: 2013-03-22 | Disposition: A | Discharge status: Home / Self Care | Payer: PRIVATE HEALTH INSURANCE | Attending: Emergency Medicine | Admitting: Emergency Medicine

## 2013-03-21 DIAGNOSIS — J4489 Other specified chronic obstructive pulmonary disease: Secondary | ICD-10-CM | POA: Insufficient documentation

## 2013-03-21 DIAGNOSIS — R1110 Vomiting, unspecified: Secondary | ICD-10-CM

## 2013-03-21 DIAGNOSIS — Z8719 Personal history of other diseases of the digestive system: Secondary | ICD-10-CM | POA: Insufficient documentation

## 2013-03-21 DIAGNOSIS — I2510 Atherosclerotic heart disease of native coronary artery without angina pectoris: Secondary | ICD-10-CM | POA: Insufficient documentation

## 2013-03-21 DIAGNOSIS — J449 Chronic obstructive pulmonary disease, unspecified: Secondary | ICD-10-CM | POA: Insufficient documentation

## 2013-03-21 DIAGNOSIS — R748 Abnormal levels of other serum enzymes: Secondary | ICD-10-CM | POA: Insufficient documentation

## 2013-03-21 DIAGNOSIS — Z79899 Other long term (current) drug therapy: Secondary | ICD-10-CM | POA: Insufficient documentation

## 2013-03-21 DIAGNOSIS — R112 Nausea with vomiting, unspecified: Secondary | ICD-10-CM | POA: Insufficient documentation

## 2013-03-21 DIAGNOSIS — I10 Essential (primary) hypertension: Secondary | ICD-10-CM | POA: Insufficient documentation

## 2013-03-21 DIAGNOSIS — F17200 Nicotine dependence, unspecified, uncomplicated: Secondary | ICD-10-CM | POA: Insufficient documentation

## 2013-03-21 DIAGNOSIS — E119 Type 2 diabetes mellitus without complications: Secondary | ICD-10-CM | POA: Insufficient documentation

## 2013-03-21 DIAGNOSIS — A5900 Urogenital trichomoniasis, unspecified: Secondary | ICD-10-CM | POA: Insufficient documentation

## 2013-03-21 DIAGNOSIS — R7989 Other specified abnormal findings of blood chemistry: Secondary | ICD-10-CM

## 2013-03-21 LAB — COMPREHENSIVE METABOLIC PANEL
AST: 19 U/L (ref 0–37)
Albumin: 4.1 g/dL (ref 3.5–5.2)
Chloride: 101 mEq/L (ref 96–112)
Creatinine, Ser: 2.2 mg/dL — ABNORMAL HIGH (ref 0.50–1.35)
Total Bilirubin: 0.4 mg/dL (ref 0.3–1.2)

## 2013-03-21 LAB — CBC WITH DIFFERENTIAL/PLATELET
Basophils Absolute: 0 10*3/uL (ref 0.0–0.1)
Basophils Relative: 0 % (ref 0–1)
HCT: 40.5 % (ref 39.0–52.0)
MCHC: 33.6 g/dL (ref 30.0–36.0)
Monocytes Absolute: 1.2 10*3/uL — ABNORMAL HIGH (ref 0.1–1.0)
Neutro Abs: 3.1 10*3/uL (ref 1.7–7.7)
Neutrophils Relative %: 38 % — ABNORMAL LOW (ref 43–77)
Platelets: 308 10*3/uL (ref 150–400)
RDW: 14.7 % (ref 11.5–15.5)

## 2013-03-21 LAB — GLUCOSE, CAPILLARY: Glucose-Capillary: 137 mg/dL — ABNORMAL HIGH (ref 70–99)

## 2013-03-21 LAB — POCT I-STAT TROPONIN I: Troponin i, poc: 0.02 ng/mL (ref 0.00–0.08)

## 2013-03-21 LAB — URINALYSIS, ROUTINE W REFLEX MICROSCOPIC
Bilirubin Urine: NEGATIVE
Ketones, ur: 15 mg/dL — AB
Nitrite: NEGATIVE
Protein, ur: NEGATIVE mg/dL
Specific Gravity, Urine: 1.019 (ref 1.005–1.030)
Urobilinogen, UA: 1 mg/dL (ref 0.0–1.0)

## 2013-03-21 MED ORDER — ONDANSETRON 4 MG PO TBDP
ORAL_TABLET | ORAL | Status: AC
  Filled 2013-03-21: qty 1

## 2013-03-21 MED ORDER — SODIUM CHLORIDE 0.9 % IV BOLUS (SEPSIS)
500.0000 mL | Freq: Once | INTRAVENOUS | Status: AC
  Administered 2013-03-21: 500 mL via INTRAVENOUS

## 2013-03-21 MED ORDER — ONDANSETRON 4 MG PO TBDP
4.0000 mg | ORAL_TABLET | Freq: Once | ORAL | Status: AC
  Administered 2013-03-21: 4 mg via ORAL

## 2013-03-21 NOTE — ED Notes (Signed)
Labs, VS, ekg & hx reviewed. nv continue. Pt alert, NAD, calm, interactive. Sitting in w/c with friend at side.

## 2013-03-21 NOTE — ED Notes (Signed)
PT. REPORTS NAUSEA , VOMITTING , " I DON,T FEEL GOOD " FOR SEVERAL DAYS , CONSTIPATION - LAST BM YESTERDAY .

## 2013-03-21 NOTE — ED Notes (Signed)
Patient reports "not feeling good" over the past 3 days. Has been more fatigued than usual. Increased cough, sometimes with blood-tinged sputum, chills, shaking and shortness of breath. Has been using inhaler with not much relief. No fevers, no bloody or black stools, no chest pain, no dizziness.

## 2013-03-21 NOTE — ED Notes (Signed)
Patient transported to X-ray 

## 2013-03-21 NOTE — ED Notes (Signed)
Gave pt two liters of oxygen (nasal cannula) due to complaint of shortness of breath

## 2013-03-21 NOTE — ED Notes (Signed)
MD at bedside. 

## 2013-03-22 ENCOUNTER — Emergency Department (HOSPITAL_COMMUNITY): Payer: PRIVATE HEALTH INSURANCE

## 2013-03-22 ENCOUNTER — Inpatient Hospital Stay (HOSPITAL_COMMUNITY)
Admission: EM | Admit: 2013-03-22 | Discharge: 2013-03-24 | DRG: 682 | Disposition: A | Discharge status: Home / Self Care | Payer: PRIVATE HEALTH INSURANCE | Attending: Internal Medicine | Admitting: Internal Medicine

## 2013-03-22 ENCOUNTER — Encounter (HOSPITAL_COMMUNITY): Payer: Self-pay | Admitting: Emergency Medicine

## 2013-03-22 DIAGNOSIS — E875 Hyperkalemia: Secondary | ICD-10-CM | POA: Diagnosis present

## 2013-03-22 DIAGNOSIS — D62 Acute posthemorrhagic anemia: Secondary | ICD-10-CM

## 2013-03-22 DIAGNOSIS — M6282 Rhabdomyolysis: Secondary | ICD-10-CM | POA: Diagnosis present

## 2013-03-22 DIAGNOSIS — E119 Type 2 diabetes mellitus without complications: Secondary | ICD-10-CM | POA: Diagnosis present

## 2013-03-22 DIAGNOSIS — F1490 Cocaine use, unspecified, uncomplicated: Secondary | ICD-10-CM

## 2013-03-22 DIAGNOSIS — G9340 Encephalopathy, unspecified: Secondary | ICD-10-CM | POA: Diagnosis present

## 2013-03-22 DIAGNOSIS — M79609 Pain in unspecified limb: Secondary | ICD-10-CM

## 2013-03-22 DIAGNOSIS — E872 Acidosis, unspecified: Secondary | ICD-10-CM | POA: Diagnosis present

## 2013-03-22 DIAGNOSIS — E860 Dehydration: Secondary | ICD-10-CM | POA: Diagnosis present

## 2013-03-22 DIAGNOSIS — I252 Old myocardial infarction: Secondary | ICD-10-CM

## 2013-03-22 DIAGNOSIS — G40909 Epilepsy, unspecified, not intractable, without status epilepticus: Secondary | ICD-10-CM | POA: Diagnosis present

## 2013-03-22 DIAGNOSIS — J449 Chronic obstructive pulmonary disease, unspecified: Secondary | ICD-10-CM

## 2013-03-22 DIAGNOSIS — F17200 Nicotine dependence, unspecified, uncomplicated: Secondary | ICD-10-CM | POA: Diagnosis present

## 2013-03-22 DIAGNOSIS — Z8709 Personal history of other diseases of the respiratory system: Secondary | ICD-10-CM

## 2013-03-22 DIAGNOSIS — M545 Low back pain: Secondary | ICD-10-CM

## 2013-03-22 DIAGNOSIS — N183 Chronic kidney disease, stage 3 unspecified: Secondary | ICD-10-CM | POA: Diagnosis present

## 2013-03-22 DIAGNOSIS — I10 Essential (primary) hypertension: Secondary | ICD-10-CM

## 2013-03-22 DIAGNOSIS — K219 Gastro-esophageal reflux disease without esophagitis: Secondary | ICD-10-CM | POA: Diagnosis present

## 2013-03-22 DIAGNOSIS — M109 Gout, unspecified: Secondary | ICD-10-CM

## 2013-03-22 DIAGNOSIS — J45909 Unspecified asthma, uncomplicated: Secondary | ICD-10-CM

## 2013-03-22 DIAGNOSIS — N390 Urinary tract infection, site not specified: Secondary | ICD-10-CM | POA: Diagnosis present

## 2013-03-22 DIAGNOSIS — I129 Hypertensive chronic kidney disease with stage 1 through stage 4 chronic kidney disease, or unspecified chronic kidney disease: Secondary | ICD-10-CM | POA: Diagnosis present

## 2013-03-22 DIAGNOSIS — R569 Unspecified convulsions: Secondary | ICD-10-CM

## 2013-03-22 DIAGNOSIS — J441 Chronic obstructive pulmonary disease with (acute) exacerbation: Secondary | ICD-10-CM | POA: Diagnosis present

## 2013-03-22 DIAGNOSIS — Z8719 Personal history of other diseases of the digestive system: Secondary | ICD-10-CM

## 2013-03-22 DIAGNOSIS — N189 Chronic kidney disease, unspecified: Secondary | ICD-10-CM | POA: Diagnosis present

## 2013-03-22 DIAGNOSIS — N179 Acute kidney failure, unspecified: Principal | ICD-10-CM | POA: Diagnosis present

## 2013-03-22 HISTORY — DX: Acute myocardial infarction, unspecified: I21.9

## 2013-03-22 HISTORY — DX: Gastro-esophageal reflux disease without esophagitis: K21.9

## 2013-03-22 HISTORY — DX: Shortness of breath: R06.02

## 2013-03-22 HISTORY — DX: Pneumonia, unspecified organism: J18.9

## 2013-03-22 LAB — HEMOGLOBIN A1C
Hgb A1c MFr Bld: 5.9 % — ABNORMAL HIGH (ref <5.7)
Mean Plasma Glucose: 123 mg/dL — ABNORMAL HIGH (ref <117)

## 2013-03-22 LAB — CBC WITH DIFFERENTIAL/PLATELET
Basophils Relative: 1 % (ref 0–1)
HCT: 38.7 % — ABNORMAL LOW (ref 39.0–52.0)
Hemoglobin: 13.3 g/dL (ref 13.0–17.0)
Lymphs Abs: 1.3 10*3/uL (ref 0.7–4.0)
MCH: 28.1 pg (ref 26.0–34.0)
MCHC: 34.4 g/dL (ref 30.0–36.0)
Monocytes Absolute: 1.2 10*3/uL — ABNORMAL HIGH (ref 0.1–1.0)
Monocytes Relative: 18 % — ABNORMAL HIGH (ref 3–12)
Neutro Abs: 3.7 10*3/uL (ref 1.7–7.7)
RBC: 4.74 MIL/uL (ref 4.22–5.81)

## 2013-03-22 LAB — CREATININE, SERUM
GFR calc Af Amer: 45 mL/min — ABNORMAL LOW (ref >90)
GFR calc non Af Amer: 39 mL/min — ABNORMAL LOW (ref >90)

## 2013-03-22 LAB — BASIC METABOLIC PANEL
BUN: 21 mg/dL (ref 6–23)
Chloride: 101 mEq/L (ref 96–112)
GFR calc Af Amer: 40 mL/min — ABNORMAL LOW (ref >90)
Glucose, Bld: 88 mg/dL (ref 70–99)
Potassium: 5.3 mEq/L — ABNORMAL HIGH (ref 3.5–5.1)

## 2013-03-22 LAB — CBC
HCT: 38.1 % — ABNORMAL LOW (ref 39.0–52.0)
Hemoglobin: 12.7 g/dL — ABNORMAL LOW (ref 13.0–17.0)
RDW: 14.9 % (ref 11.5–15.5)
WBC: 3.2 10*3/uL — ABNORMAL LOW (ref 4.0–10.5)

## 2013-03-22 LAB — POCT I-STAT 3, ART BLOOD GAS (G3+)
Acid-base deficit: 5 mmol/L — ABNORMAL HIGH (ref 0.0–2.0)
Bicarbonate: 19.6 mEq/L — ABNORMAL LOW (ref 20.0–24.0)
O2 Saturation: 94 %
pO2, Arterial: 75 mmHg — ABNORMAL LOW (ref 80.0–100.0)

## 2013-03-22 LAB — POCT I-STAT TROPONIN I: Troponin i, poc: 0 ng/mL (ref 0.00–0.08)

## 2013-03-22 LAB — POTASSIUM: Potassium: 5.1 mEq/L (ref 3.5–5.1)

## 2013-03-22 MED ORDER — PANTOPRAZOLE SODIUM 40 MG PO TBEC
40.0000 mg | DELAYED_RELEASE_TABLET | Freq: Every day | ORAL | Status: DC
  Administered 2013-03-22 – 2013-03-24 (×3): 40 mg via ORAL
  Filled 2013-03-22 (×4): qty 1

## 2013-03-22 MED ORDER — ONDANSETRON HCL 4 MG/2ML IJ SOLN: 4.0000 mg | Freq: Three times a day (TID) | INTRAMUSCULAR | Status: AC | PRN

## 2013-03-22 MED ORDER — METHYLPREDNISOLONE SODIUM SUCC 125 MG IJ SOLR
60.0000 mg | Freq: Three times a day (TID) | INTRAMUSCULAR | Status: DC
  Administered 2013-03-22 – 2013-03-23 (×2): 60 mg via INTRAVENOUS
  Filled 2013-03-22: qty 0.96
  Filled 2013-03-22: qty 2
  Filled 2013-03-22 (×4): qty 0.96

## 2013-03-22 MED ORDER — ALBUTEROL SULFATE HFA 108 (90 BASE) MCG/ACT IN AERS
2.0000 | INHALATION_SPRAY | Freq: Four times a day (QID) | RESPIRATORY_TRACT | Status: DC | PRN
  Administered 2013-03-22: 2 via RESPIRATORY_TRACT
  Filled 2013-03-22: qty 6.7

## 2013-03-22 MED ORDER — METHYLPREDNISOLONE SODIUM SUCC 125 MG IJ SOLR
125.0000 mg | Freq: Once | INTRAMUSCULAR | Status: AC
  Administered 2013-03-22: 125 mg via INTRAVENOUS
  Filled 2013-03-22: qty 2

## 2013-03-22 MED ORDER — AZITHROMYCIN 250 MG PO TABS
1000.0000 mg | ORAL_TABLET | Freq: Once | ORAL | Status: AC
  Administered 2013-03-22: 1000 mg via ORAL
  Filled 2013-03-22: qty 4

## 2013-03-22 MED ORDER — SODIUM POLYSTYRENE SULFONATE 15 GM/60ML PO SUSP
15.0000 g | Freq: Once | ORAL | Status: AC
  Administered 2013-03-22: 15 g via ORAL
  Filled 2013-03-22: qty 60

## 2013-03-22 MED ORDER — ALLOPURINOL 100 MG PO TABS
200.0000 mg | ORAL_TABLET | Freq: Every day | ORAL | Status: DC
  Administered 2013-03-22 – 2013-03-24 (×3): 200 mg via ORAL
  Filled 2013-03-22 (×4): qty 2

## 2013-03-22 MED ORDER — ENOXAPARIN SODIUM 40 MG/0.4ML ~~LOC~~ SOLN
40.0000 mg | SUBCUTANEOUS | Status: DC
  Administered 2013-03-22 – 2013-03-23 (×2): 40 mg via SUBCUTANEOUS
  Filled 2013-03-22 (×3): qty 0.4

## 2013-03-22 MED ORDER — ALBUTEROL SULFATE (5 MG/ML) 0.5% IN NEBU
5.0000 mg | INHALATION_SOLUTION | Freq: Once | RESPIRATORY_TRACT | Status: AC
  Administered 2013-03-22: 5 mg via RESPIRATORY_TRACT
  Filled 2013-03-22: qty 1

## 2013-03-22 MED ORDER — SODIUM CHLORIDE 0.9 % IV BOLUS (SEPSIS)
1000.0000 mL | Freq: Once | INTRAVENOUS | Status: AC
  Administered 2013-03-22: 1000 mL via INTRAVENOUS

## 2013-03-22 MED ORDER — SODIUM CHLORIDE 0.9 % IJ SOLN
3.0000 mL | Freq: Two times a day (BID) | INTRAMUSCULAR | Status: DC
  Administered 2013-03-22 – 2013-03-24 (×3): 3 mL via INTRAVENOUS

## 2013-03-22 MED ORDER — METRONIDAZOLE 500 MG PO TABS: 500.0000 mg | ORAL_TABLET | Freq: Two times a day (BID) | ORAL | Status: DC

## 2013-03-22 MED ORDER — DEXTROSE 5 % IV SOLN: 1.0000 g | Freq: Once | INTRAVENOUS | Status: DC

## 2013-03-22 MED ORDER — ONDANSETRON HCL 4 MG PO TABS: 4.0000 mg | ORAL_TABLET | Freq: Four times a day (QID) | ORAL | Status: DC | PRN

## 2013-03-22 MED ORDER — MOMETASONE FURO-FORMOTEROL FUM 100-5 MCG/ACT IN AERO
2.0000 | INHALATION_SPRAY | Freq: Two times a day (BID) | RESPIRATORY_TRACT | Status: DC
  Administered 2013-03-22 – 2013-03-24 (×3): 2 via RESPIRATORY_TRACT
  Filled 2013-03-22: qty 8.8

## 2013-03-22 MED ORDER — MONTELUKAST SODIUM 10 MG PO TABS
10.0000 mg | ORAL_TABLET | Freq: Every day | ORAL | Status: DC
  Administered 2013-03-22 – 2013-03-23 (×2): 10 mg via ORAL
  Filled 2013-03-22 (×3): qty 1

## 2013-03-22 MED ORDER — ONDANSETRON HCL 4 MG PO TABS: 4.0000 mg | ORAL_TABLET | Freq: Four times a day (QID) | ORAL | Status: DC

## 2013-03-22 MED ORDER — IPRATROPIUM BROMIDE 0.02 % IN SOLN
0.5000 mg | Freq: Once | RESPIRATORY_TRACT | Status: AC
  Administered 2013-03-22: 0.5 mg via RESPIRATORY_TRACT
  Filled 2013-03-22: qty 2.5

## 2013-03-22 MED ORDER — LEVOFLOXACIN 500 MG PO TABS
500.0000 mg | ORAL_TABLET | Freq: Every day | ORAL | Status: DC
  Administered 2013-03-22 – 2013-03-23 (×2): 500 mg via ORAL
  Filled 2013-03-22 (×2): qty 1

## 2013-03-22 MED ORDER — ONDANSETRON HCL 4 MG/2ML IJ SOLN: 4.0000 mg | Freq: Four times a day (QID) | INTRAMUSCULAR | Status: DC | PRN

## 2013-03-22 MED ORDER — CEFTRIAXONE SODIUM 1 G IJ SOLR
1.0000 g | Freq: Once | INTRAMUSCULAR | Status: AC
  Administered 2013-03-22: 1 g via INTRAMUSCULAR
  Filled 2013-03-22: qty 10

## 2013-03-22 MED ORDER — DICLOFENAC SODIUM 1 % TD GEL
1.0000 "application " | Freq: Four times a day (QID) | TRANSDERMAL | Status: DC
  Administered 2013-03-22 – 2013-03-24 (×6): 1 via TOPICAL
  Filled 2013-03-22 (×2): qty 100

## 2013-03-22 NOTE — H&P (Addendum)
[facility] History and Physical  Luis Carter WGN:[id number] DOB: 1946/03/16 DOA: 03/22/2013  Referring physician: Dr lozier PCP: Rogelia Boga, MD  Specialists: none  Chief Complaint:  Confusion, and sob since yesterday.   HPI: Luis Carter is a 67 y.o. male with copd , continues to smoke, brought in by family yesterday for sob and confusion. On arrival to ED  he was found to be acute renal failure . He was given fluids and  CT head which was negative for acute CVA.  He was brought back to ED by family for similar complaints. ona rrival to ED, his creatinine improved and he was found wheezing. He was given IV steroids in ED and albuterol nebs. His family also complained of one episode of vomiting. He was admitted to medical service for further evaluation.   Review of Systems: could not be obtained.   Past Medical History  Diagnosis Date  . ASTHMA 04/14/2007  . COPD 04/14/2007  . DIABETES MELLITUS, TYPE II 04/14/2007  . HYPERTENSION 04/14/2007  . LEG PAIN, RIGHT 05/19/2008  . LOW BACK PAIN 04/14/2007  . PANCREATITIS, HX OF 04/14/2007  . PNEUMONIA, HX OF 04/14/2007  . SEIZURE DISORDER 04/14/2007  . Coronary artery disease   . Gout   . GI bleed    Past Surgical History  Procedure Laterality Date  . Coronary stent placement    . Esophagogastroduodenoscopy  12/21/2011    Procedure: ESOPHAGOGASTRODUODENOSCOPY (EGD);  Surgeon: Louis Meckel, MD;  Location: [facility] ENDOSCOPY;  Service: Endoscopy;  Laterality: N/A;   Social History:  reports that he has been smoking Cigarettes.  He has a 7.5 pack-year smoking history. He has never used smokeless tobacco. He reports that he does not drink alcohol or use illicit drugs. where does patient live--home,   No Known Allergies  Family History  Problem Relation Age of Onset  . Heart disease Sister   . Heart disease Brother   . Heart disease Brother   . Heart disease Brother   . Heart disease Brother     Prior to Admission  medications   Medication Sig Start Date End Date Taking? Authorizing Provider  albuterol (PROVENTIL HFA;VENTOLIN HFA) 108 (90 BASE) MCG/ACT inhaler Inhale 2 puffs into the lungs every 6 (six) hours as needed for wheezing. 11/24/12 11/24/13 Yes Gordy Savers, MD  allopurinol (ZYLOPRIM) 100 MG tablet Take 200 mg by mouth daily.   Yes Historical Provider, MD  colchicine 0.6 MG tablet Take 0.6 mg by mouth 2 (two) times daily as needed (for gout flare-ups).   Yes Historical Provider, MD  cyclobenzaprine (FLEXERIL) 10 MG tablet Take 1 tablet (10 mg total) by mouth at bedtime. 10/25/12  Yes Gordy Savers, MD  diclofenac sodium (VOLTAREN) 1 % GEL Apply 1 application topically 4 (four) times daily. 10/25/12  Yes Gordy Savers, MD  Fluticasone-Salmeterol (ADVAIR DISKUS) 250-50 MCG/DOSE AEPB Inhale 1 puff into the lungs every 12 (twelve) hours. 10/25/12  Yes Gordy Savers, MD  lisinopril-hydrochlorothiazide (PRINZIDE,ZESTORETIC) 20-25 MG per tablet Take 1 tablet by mouth daily. 11/24/12  Yes Gordy Savers, MD  metFORMIN (GLUCOPHAGE) 500 MG tablet Take 1 tablet (500 mg total) by mouth 2 (two) times daily with a meal. 10/25/12  Yes Gordy Savers, MD  montelukast (SINGULAIR) 10 MG tablet Take 1 tablet (10 mg total) by mouth at bedtime. 10/25/12  Yes Gordy Savers, MD  omeprazole (PRILOSEC) 20 MG capsule Take 20 mg by mouth daily.   Yes Historical Provider,  MD  traMADol (ULTRAM) 50 MG tablet Take 1 tablet (50 mg total) by mouth every 8 (eight) hours as needed for pain. 02/23/13  Yes Gordy Savers, MD   Physical Exam: Filed Vitals:   03/22/13 0830 03/22/13 1029 03/22/13 1045 03/22/13 1243  BP: 124/77 136/74 130/75 136/89  Pulse: 78 88 82 79  Temp:      TempSrc:      Resp:  20  23  SpO2: 97% 100% 94% 100%    Constitutional: Vital signs reviewed.  Patient is a well-developed and well-nourished  in no acute distress and cooperative with exam. Alert and oriented to person and  place. Not oriented to time. .  Head: Normocephalic and atraumatic Mouth: no erythema or exudates, MMM Eyes: PERRL, EOMI, conjunctivae normal, No scleral icterus.  Neck: Supple, Trachea midline normal ROM, No JVD, mass, thyromegaly, or carotid bruit present.  Cardiovascular: RRR, S1 normal, S2 normal, no MRG, pulses symmetric and intact bilaterally Pulmonary/Chest:scattered wheezing heard bilateral .  Abdominal: Soft. Non-tender, non-distended, bowel sounds are normal, no masses, organomegaly, or guarding present.  Musculoskeletal: No joint deformities, erythema, or stiffness, ROM full and no nontender Neurological: A&Oriented to place and person. , Strength is normal and symmetric bilaterally, able to move all extremities. Speech - stuttering.  Skin: Warm, dry and intact. No rash, cyanosis, or clubbing.     Labs on Admission:  Basic Metabolic Panel:  Recent Labs Lab 03/21/13 1937 03/22/13 0844  NA 135 134*  K 4.5 5.3*  CL 101 101  CO2 16* 22  GLUCOSE 136* 88  BUN 25* 21  CREATININE 2.20* 1.91*  CALCIUM 9.7 9.2   Liver Function Tests:  Recent Labs Lab 03/21/13 1937  AST 19  ALT 7  ALKPHOS 68  BILITOT 0.4  PROT 7.7  ALBUMIN 4.1    Recent Labs Lab 03/21/13 1937  LIPASE 11   No results found for this basename: AMMONIA,  in the last 168 hours CBC:  Recent Labs Lab 03/21/13 1937 03/22/13 0844  WBC 8.2 6.6  NEUTROABS 3.1 3.7  HGB 13.6 13.3  HCT 40.5 38.7*  MCV 81.2 81.6  PLT 308 292   Cardiac Enzymes:  Recent Labs Lab 03/22/13 0844  CKTOTAL 415*    BNP (last 3 results) No results found for this basename: PROBNP,  in the last 8760 hours CBG:  Recent Labs Lab 03/21/13 1941 03/22/13 0911 03/22/13 1033  GLUCAP 137* 55* 131*    Radiological Exams on Admission: Ct Head Wo Contrast  03/22/2013   *RADIOLOGY REPORT*  Clinical Data: Acute mental status changes with confusion. Headache.  Nausea and vomiting.  CT HEAD WITHOUT CONTRAST  Technique:   Contiguous axial images were obtained from the base of the skull through the vertex without contrast.  Comparison: CT head 12/21/2011.  Findings: Ventricular system normal in size and appearance for age. Mild changes of small vessel disease of the white matter diffusely, unchanged.  Physiologic calcifications in the basal ganglia, unchanged.  Moderate cerebellar atrophy, unchanged.  No mass lesion.  No midline shift.  No acute hemorrhage or hematoma.  No extra-axial fluid collections.  No evidence of acute infarction. No significant interval change.  No skull fracture or other focal osseous abnormality involving the skull.  Visualized paranasal sinuses, bilateral mastoid air cells, and bilateral middle ear cavities well-aerated.  Bilateral carotid siphon atherosclerosis.  IMPRESSION:  1.  No acute intracranial abnormality. 2.  Stable moderate cerebellar atrophy and mild chronic microvascular ischemic changes of the white  matter.   Original Report Authenticated By: Hulan Saas, M.D.   Dg Abd Acute W/chest  03/21/2013   *RADIOLOGY REPORT*  Clinical Data: Cough and emesis  ACUTE ABDOMEN SERIES (ABDOMEN 2 VIEW & CHEST 1 VIEW)  Comparison:  Chest radiograph December 21, 2011  Findings:  PA chest: There is mild scarring in the right base.  There is underlying emphysema.  There is no edema or consolidation.  Heart size is normal.  Pulmonary vascularity is consistent with the underlying emphysema and stable.  There are metallic foreign bodies on the left.  No adenopathy.  Supine and upright abdomen:  There is moderate stool in the colon. Bowel gas pattern is unremarkable.  No obstruction or free air. There are phleboliths in the pelvis.  There is degenerative change in the thoracic spine.  IMPRESSION: The bowel gas pattern is unremarkable.  There is no edema or consolidation.  Underlying emphysema present.   Original Report Authenticated By: Bretta Bang, M.D.    EKG: NSR.  Assessment/Plan Active Problems: 1.  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  - improving with IV fluids  - get UA.   2.  Abnormal UA:  - urine cultures ordered.   3. Confusion/ encephalopathy: possibly metabolic from acute renal failure. Get TSH.  Continue to monitor.   4. Hyperkalemia: kayexalate ordered. EKG NSR.  REpeat potassium tonight.   5. DVT prophylaxis.   6. Copd exacerbation: resume iv steroids, nebs and oxygen as needed.   Code Status: full code Family Communication: family at bedside. Disposition Plan: pending PT EVAL.   Time spent: 70 min  Luis Carter [facility] Pager [phone]  If 7PM-7AM, please contact night-coverage [email or web address] Password [facility] 03/22/2013, 1:41 PM

## 2013-03-22 NOTE — ED Notes (Signed)
Admitting MD at bedside, waiting for bed assignment.

## 2013-03-22 NOTE — ED Notes (Signed)
Family at bedside. 

## 2013-03-22 NOTE — Progress Notes (Signed)
NURSING PROGRESS NOTE  Luis Carter [id number] Admission Data: 03/22/2013 3:52 PM Attending Provider: Kathlen Mody, MD VHQ:[id number] Luis Fellers, MD Code Status: full  Luis Carter is a 67 y.o. male patient admitted from ED:  -No acute distress noted.  -No complaints of shortness of breath.  -No complaints of chest pain.   Cardiac Monitoring: Box # [id number] in place. Cardiac monitor yields:normal sinus rhythm.  Blood pressure 148/74, pulse 87, temperature 98.2 F (36.8 C), temperature source Oral, resp. rate 23, SpO2 97.00%.   IV Fluids:  IV in place, occlusive dsg intact without redness, IV cath hand right, condition patent and no redness normal saline.   Allergies:  Review of patient's allergies indicates no known allergies.  Past Medical History:   has a past medical history of ASTHMA (04/14/2007); COPD (04/14/2007); DIABETES MELLITUS, TYPE II (04/14/2007); HYPERTENSION (04/14/2007); LEG PAIN, RIGHT (05/19/2008); LOW BACK PAIN (04/14/2007); PANCREATITIS, HX OF (04/14/2007); PNEUMONIA, HX OF (04/14/2007); SEIZURE DISORDER (04/14/2007); Coronary artery disease; Gout; and GI bleed.  Past Surgical History:   has past surgical history that includes Coronary stent placement and Esophagogastroduodenoscopy (12/21/2011).  Social History:   reports that he has been smoking Cigarettes.  He has a 7.5 pack-year smoking history. He has never used smokeless tobacco. He reports that he does not drink alcohol or use illicit drugs.  Skin: warm dry  Patient/Family orientated to room. Information packet given to patient/family. Admission inpatient armband information verified with patient/family to include name and date of birth and placed on patient arm. Side rails up x 2, fall assessment and education completed with patient/family. Patient/family able to verbalize understanding of risk associated with falls and verbalized understanding to call for assistance before getting out of bed. Call light  within reach. Patient/family able to voice and demonstrate understanding of unit orientation instructions.    Will continue to evaluate and treat per MD orders.

## 2013-03-22 NOTE — ED Notes (Signed)
Pt. Was here earlier in the shift for N/V and found to have STD. Treated and sent home. Woke up around . Ago and according to wife was not acting right. States he is confused.

## 2013-03-22 NOTE — Progress Notes (Signed)
Pt c/o "sharp" left sided chest pain. EKG obtained, MD informed. Will continue to monitor.

## 2013-03-22 NOTE — ED Provider Notes (Signed)
CSN: [id number]     Arrival date & time 03/22/13  0654 History     First MD Initiated Contact with Patient 03/22/13 0800     Chief Complaint  Patient presents with  . Altered Mental Status   (Consider location/radiation/quality/duration/timing/severity/associated sxs/prior Treatment) Patient is a 67 y.o. male presenting with altered mental status. The history is provided by the patient and a relative. The history is limited by the condition of the patient.  Altered Mental Status Presenting symptoms: confusion and disorientation   Severity:  Moderate Most recent episode:  Yesterday Episode history:  Single Duration:  18 hours Timing:  Constant Progression:  Worsening Chronicity:  New Context: not drug use, not head injury, not a recent change in medication, not a recent illness and not a recent infection   Associated symptoms: decreased appetite, nausea, vomiting (x 1 episode, non bilious, non bloody) and weakness   Associated symptoms: normal movement, no agitation, no bladder incontinence, no difficulty breathing, no fever, no headaches, no light-headedness, no rash, no slurred speech and no visual change   Nausea:    Severity:  Mild   Onset quality:  Gradual   Duration:  18 hours   Timing:  Sporadic   Progression:  Resolved Vomiting:    Quality:  Stomach contents   Number of occurrences:  1   Severity:  Mild   Duration:  1 hour   Timing:  Sporadic   Progression:  Resolved   Past Medical History  Diagnosis Date  . ASTHMA 04/14/2007  . COPD 04/14/2007  . DIABETES MELLITUS, TYPE II 04/14/2007  . HYPERTENSION 04/14/2007  . LEG PAIN, RIGHT 05/19/2008  . LOW BACK PAIN 04/14/2007  . PANCREATITIS, HX OF 04/14/2007  . PNEUMONIA, HX OF 04/14/2007  . SEIZURE DISORDER 04/14/2007  . Coronary artery disease   . Gout   . GI bleed    Past Surgical History  Procedure Laterality Date  . Coronary stent placement    . Esophagogastroduodenoscopy  12/21/2011    Procedure:  ESOPHAGOGASTRODUODENOSCOPY (EGD);  Surgeon: Louis Meckel, MD;  Location: [facility] ENDOSCOPY;  Service: Endoscopy;  Laterality: N/A;   Family History  Problem Relation Age of Onset  . Heart disease Sister   . Heart disease Brother   . Heart disease Brother   . Heart disease Brother   . Heart disease Brother    History  Substance Use Topics  . Smoking status: Current Every Day Smoker -- 0.25 packs/day for 30 years    Types: Cigarettes  . Smokeless tobacco: Never Used  . Alcohol Use: No     Comment: former    Review of Systems  Constitutional: Positive for decreased appetite. Negative for fever, chills, diaphoresis, activity change, appetite change and fatigue.  HENT: Negative for congestion, sore throat, rhinorrhea, sneezing and trouble swallowing.   Eyes: Negative for pain and redness.  Respiratory: Negative for cough, choking, chest tightness, shortness of breath, wheezing and stridor.   Cardiovascular: Negative for chest pain and leg swelling.  Gastrointestinal: Positive for nausea and vomiting (x 1 episode, non bilious, non bloody). Negative for diarrhea, constipation, blood in stool, abdominal distention and anal bleeding.  Genitourinary: Negative for bladder incontinence.  Musculoskeletal: Positive for myalgias (right thigh). Negative for back pain.  Skin: Negative for rash.  Neurological: Positive for weakness. Negative for dizziness, speech difficulty, light-headedness, numbness and headaches.  Hematological: Negative for adenopathy.  Psychiatric/Behavioral: Positive for confusion and altered mental status. Negative for agitation.    Allergies  Review of  patient's allergies indicates no known allergies.  Home Medications   Current Outpatient Rx  Name  Route  Sig  Dispense  Refill  . albuterol (PROVENTIL HFA;VENTOLIN HFA) 108 (90 BASE) MCG/ACT inhaler   Inhalation   Inhale 2 puffs into the lungs every 6 (six) hours as needed for wheezing.   3 Inhaler   5   .  allopurinol (ZYLOPRIM) 100 MG tablet   Oral   Take 200 mg by mouth daily.         . colchicine 0.6 MG tablet   Oral   Take 0.6 mg by mouth 2 (two) times daily as needed (for gout flare-ups).         . cyclobenzaprine (FLEXERIL) 10 MG tablet   Oral   Take 1 tablet (10 mg total) by mouth at bedtime.   30 tablet   5   . diclofenac sodium (VOLTAREN) 1 % GEL   Topical   Apply 1 application topically 4 (four) times daily.   100 g   0   . Fluticasone-Salmeterol (ADVAIR DISKUS) 250-50 MCG/DOSE AEPB   Inhalation   Inhale 1 puff into the lungs every 12 (twelve) hours.   60 each   6   . lisinopril-hydrochlorothiazide (PRINZIDE,ZESTORETIC) 20-25 MG per tablet   Oral   Take 1 tablet by mouth daily.   90 tablet   3   . metFORMIN (GLUCOPHAGE) 500 MG tablet   Oral   Take 1 tablet (500 mg total) by mouth 2 (two) times daily with a meal.   180 tablet   6   . montelukast (SINGULAIR) 10 MG tablet   Oral   Take 1 tablet (10 mg total) by mouth at bedtime.   90 tablet   3   . omeprazole (PRILOSEC) 20 MG capsule   Oral   Take 20 mg by mouth daily.         . traMADol (ULTRAM) 50 MG tablet   Oral   Take 1 tablet (50 mg total) by mouth every 8 (eight) hours as needed for pain.   90 tablet   3    BP 130/75  Pulse 82  Temp(Src) 98.2 F (36.8 C) (Oral)  Resp 20  SpO2 94% Physical Exam  Nursing note and vitals reviewed. Constitutional: He appears well-developed and well-nourished. No distress.  HENT:  Head: Normocephalic and atraumatic.  Eyes: Conjunctivae and EOM are normal. Right eye exhibits no discharge. Left eye exhibits no discharge.  Neck: Normal range of motion. Neck supple. No tracheal deviation present.  Cardiovascular: Normal rate, regular rhythm and normal heart sounds.  Exam reveals no friction rub.   No murmur heard. Pulmonary/Chest: Effort normal. No stridor. No respiratory distress. He has wheezes (diffuse expiratory bilaterally). He has rales. He exhibits  no tenderness.  Abdominal: Soft. He exhibits no distension. There is no tenderness. There is no rebound and no guarding.  Neurological: He is alert. He has normal strength. GCS eye subscore is 4. GCS verbal subscore is 5. GCS motor subscore is 6.  Oriented to place and self, but not time  Skin: Skin is warm.  Psychiatric: He has a normal mood and affect.    ED Course   Procedures (including critical care time)  Labs Reviewed  BASIC METABOLIC PANEL - Abnormal; Notable for the following:    Sodium 134 (*)    Potassium 5.3 (*)    Creatinine, Ser 1.91 (*)    GFR calc non Af Amer 34 (*)  GFR calc Af Amer 40 (*)    All other components within normal limits  CBC WITH DIFFERENTIAL - Abnormal; Notable for the following:    HCT 38.7 (*)    Monocytes Relative 18 (*)    Monocytes Absolute 1.2 (*)    Eosinophils Relative 6 (*)    All other components within normal limits  CK - Abnormal; Notable for the following:    Total CK 415 (*)    All other components within normal limits  GLUCOSE, CAPILLARY - Abnormal; Notable for the following:    Glucose-Capillary 55 (*)    All other components within normal limits  GLUCOSE, CAPILLARY - Abnormal; Notable for the following:    Glucose-Capillary 131 (*)    All other components within normal limits  POCT I-STAT 3, BLOOD GAS (G3+) - Abnormal; Notable for the following:    pCO2 arterial 34.9 (*)    pO2, Arterial 75.0 (*)    Bicarbonate 19.6 (*)    Acid-base deficit 5.0 (*)    All other components within normal limits  BLOOD GAS, ARTERIAL  CG4 I-STAT (LACTIC ACID)  POCT I-STAT TROPONIN I   Results for orders placed during the hospital encounter of 03/22/13  BASIC METABOLIC PANEL      Result Value Range   Sodium 134 (*) 135 - 145 mEq/L   Potassium 5.3 (*) 3.5 - 5.1 mEq/L   Chloride 101  96 - 112 mEq/L   CO2 22  19 - 32 mEq/L   Glucose, Bld 88  70 - 99 mg/dL   BUN 21  6 - 23 mg/dL   Creatinine, Ser 1.61 (*) 0.50 - 1.35 mg/dL   Calcium 9.2   8.4 - 09.6 mg/dL   GFR calc non Af Amer 34 (*) >90 mL/min   GFR calc Af Amer 40 (*) >90 mL/min  CBC WITH DIFFERENTIAL      Result Value Range   WBC 6.6  4.0 - 10.5 K/uL   RBC 4.74  4.22 - 5.81 MIL/uL   Hemoglobin 13.3  13.0 - 17.0 g/dL   HCT 04.5 (*) 40.9 - 81.1 %   MCV 81.6  78.0 - 100.0 fL   MCH 28.1  26.0 - 34.0 pg   MCHC 34.4  30.0 - 36.0 g/dL   RDW 91.4  78.2 - 95.6 %   Platelets 292  150 - 400 K/uL   Neutrophils Relative % 56  43 - 77 %   Neutro Abs 3.7  1.7 - 7.7 K/uL   Lymphocytes Relative 20  12 - 46 %   Lymphs Abs 1.3  0.7 - 4.0 K/uL   Monocytes Relative 18 (*) 3 - 12 %   Monocytes Absolute 1.2 (*) 0.1 - 1.0 K/uL   Eosinophils Relative 6 (*) 0 - 5 %   Eosinophils Absolute 0.4  0.0 - 0.7 K/uL   Basophils Relative 1  0 - 1 %   Basophils Absolute 0.0  0.0 - 0.1 K/uL  CK      Result Value Range   Total CK 415 (*) 7 - 232 U/L  GLUCOSE, CAPILLARY      Result Value Range   Glucose-Capillary 55 (*) 70 - 99 mg/dL  GLUCOSE, CAPILLARY      Result Value Range   Glucose-Capillary 131 (*) 70 - 99 mg/dL  CG4 I-STAT (LACTIC ACID)      Result Value Range   Lactic Acid, Venous 1.47  0.5 - 2.2 mmol/L  POCT I-STAT TROPONIN I  Result Value Range   Troponin i, poc 0.00  0.00 - 0.08 ng/mL   Comment 3           POCT I-STAT 3, BLOOD GAS (G3+)      Result Value Range   pH, Arterial 7.358  7.350 - 7.450   pCO2 arterial 34.9 (*) 35.0 - 45.0 mmHg   pO2, Arterial 75.0 (*) 80.0 - 100.0 mmHg   Bicarbonate 19.6 (*) 20.0 - 24.0 mEq/L   TCO2 21  0 - 100 mmol/L   O2 Saturation 94.0     Acid-base deficit 5.0 (*) 0.0 - 2.0 mmol/L   Patient temperature 98.6 F     Collection site RADIAL, ALLEN'S TEST ACCEPTABLE     Drawn by Operator     Sample type ARTERIAL      Ct Head Wo Contrast  03/22/2013   *RADIOLOGY REPORT*  Clinical Data: Acute mental status changes with confusion. Headache.  Nausea and vomiting.  CT HEAD WITHOUT CONTRAST  Technique:  Contiguous axial images were obtained from  the base of the skull through the vertex without contrast.  Comparison: CT head 12/21/2011.  Findings: Ventricular system normal in size and appearance for age. Mild changes of small vessel disease of the white matter diffusely, unchanged.  Physiologic calcifications in the basal ganglia, unchanged.  Moderate cerebellar atrophy, unchanged.  No mass lesion.  No midline shift.  No acute hemorrhage or hematoma.  No extra-axial fluid collections.  No evidence of acute infarction. No significant interval change.  No skull fracture or other focal osseous abnormality involving the skull.  Visualized paranasal sinuses, bilateral mastoid air cells, and bilateral middle ear cavities well-aerated.  Bilateral carotid siphon atherosclerosis.  IMPRESSION:  1.  No acute intracranial abnormality. 2.  Stable moderate cerebellar atrophy and mild chronic microvascular ischemic changes of the white matter.   Original Report Authenticated By: Hulan Saas, M.D.   Dg Abd Acute W/chest  03/21/2013   *RADIOLOGY REPORT*  Clinical Data: Cough and emesis  ACUTE ABDOMEN SERIES (ABDOMEN 2 VIEW & CHEST 1 VIEW)  Comparison:  Chest radiograph December 21, 2011  Findings:  PA chest: There is mild scarring in the right base.  There is underlying emphysema.  There is no edema or consolidation.  Heart size is normal.  Pulmonary vascularity is consistent with the underlying emphysema and stable.  There are metallic foreign bodies on the left.  No adenopathy.  Supine and upright abdomen:  There is moderate stool in the colon. Bowel gas pattern is unremarkable.  No obstruction or free air. There are phleboliths in the pelvis.  There is degenerative change in the thoracic spine.  IMPRESSION: The bowel gas pattern is unremarkable.  There is no edema or consolidation.  Underlying emphysema present.   Original Report Authenticated By: Bretta Bang, M.D.   No diagnosis found.    Date: 03/22/2013  Rate: 79  Rhythm: normal sinus rhythm  QRS Axis:  indeterminate  Intervals: normal  ST/T Wave abnormalities: nonspecific ST changes and nonspecific T wave changes  Conduction Disutrbances:none  Narrative Interpretation:   Old EKG Reviewed: changes noted, nonspecific T wave changes in inferior leads compared to previous   MDM  Luis Carter 67 y.o. with DM, asthma and HTN presents for confusion, N/V, and tremor. He was reportedly seen here last night for the same symptoms, and was diagnosed and treated for STIs. AFVSS. NAD. He appears confused. Not oriented to time, which is a change from baseline. No resp distress, but  diffuse wheezing with coarse sounds bilaterally. Bilateral upper extremity tremor on exam, resting. AMS workup indicated.  Point care glucose 55. Chest x-ray showed no evidence of pneumonia yesterday, and doubt any significant change in the past 24 hours. CT head negative for acute intracranial abnormality. Hemoglobin is stable. Creatinine is improved compared to yesterday and is now 1.91. ABG shows no evidence of a significant acidosis. Troponin negative. Lactic acid negative. CK slightly elevate at 415.   Patient was given by mouth fluids to correct his blood sugar improved to 115. Considering patient is a slightly elevated CK and showed slight worsening of renal failure yesterday, patient was given a bolus IV fluids.    Admission indicated for acute on chronic renal failure, with an anion gap metabolic acidosis present last night, and an elevated potassium. Acute on chronic renal failure could be related to dehydration with small component of rhabdo. Patient admitted to the hospitalist service. EKG prior to admission showed no evidence of changes consistent with hyperkalemia, therefore temporizing measures were held and referred to the admitting team.  Labs, imaging, and previous encounters reviewed. I discussed this patient's care with my attending, Dr. Radford Pax.   Sena Hitch, MD 03/22/13 [phone]

## 2013-03-22 NOTE — ED Notes (Signed)
Pt provided turkey sandwich and grape juice

## 2013-03-22 NOTE — ED Notes (Signed)
Pt provided orange juice after passing swallow screen for CBG.

## 2013-03-22 NOTE — ED Notes (Signed)
MD at bedside. 

## 2013-03-22 NOTE — ED Provider Notes (Signed)
CSN: [id number]     Arrival date & time 03/21/13  1925 History     First MD Initiated Contact with Patient 03/21/13 2056     Chief Complaint  Patient presents with  . Emesis   (Consider location/radiation/quality/duration/timing/severity/associated sxs/prior Treatment) HPI Comments: Patient presents to the ER for evaluation of nausea and vomiting. Patient reports that he has been feeling well for several days. He became acutely more ill this evening. Patient experiencing chills associated with nausea and vomiting. He has not had diarrhea, thinks he has had constipation. Has not documented temperature. Patient not experiencing chest pain. Has not had any cough or shortness of breath.  Patient is a 67 y.o. male presenting with vomiting.  Emesis Associated symptoms: chills     Past Medical History  Diagnosis Date  . ASTHMA 04/14/2007  . COPD 04/14/2007  . DIABETES MELLITUS, TYPE II 04/14/2007  . HYPERTENSION 04/14/2007  . LEG PAIN, RIGHT 05/19/2008  . LOW BACK PAIN 04/14/2007  . PANCREATITIS, HX OF 04/14/2007  . PNEUMONIA, HX OF 04/14/2007  . SEIZURE DISORDER 04/14/2007  . Coronary artery disease   . Gout   . GI bleed    Past Surgical History  Procedure Laterality Date  . Coronary stent placement    . Esophagogastroduodenoscopy  12/21/2011    Procedure: ESOPHAGOGASTRODUODENOSCOPY (EGD);  Surgeon: Louis Meckel, MD;  Location: [facility] ENDOSCOPY;  Service: Endoscopy;  Laterality: N/A;   Family History  Problem Relation Age of Onset  . Heart disease Sister   . Heart disease Brother   . Heart disease Brother   . Heart disease Brother   . Heart disease Brother    History  Substance Use Topics  . Smoking status: Current Every Day Smoker -- 0.25 packs/day for 30 years    Types: Cigarettes  . Smokeless tobacco: Never Used  . Alcohol Use: No     Comment: former    Review of Systems  Constitutional: Positive for chills.  Gastrointestinal: Positive for nausea and vomiting.   Neurological: Positive for weakness.  All other systems reviewed and are negative.    Allergies  Review of patient's allergies indicates no known allergies.  Home Medications   Current Outpatient Rx  Name  Route  Sig  Dispense  Refill  . albuterol (PROVENTIL HFA;VENTOLIN HFA) 108 (90 BASE) MCG/ACT inhaler   Inhalation   Inhale 2 puffs into the lungs every 6 (six) hours as needed for wheezing.   3 Inhaler   5   . allopurinol (ZYLOPRIM) 100 MG tablet      TAKE 2 TABLETS BY MOUTH EVERY DAY   60 tablet   5   . colchicine 0.6 MG tablet   Oral   Take 0.6 mg by mouth 2 (two) times daily as needed (for gout flare-ups).         . cyclobenzaprine (FLEXERIL) 10 MG tablet   Oral   Take 1 tablet (10 mg total) by mouth at bedtime.   30 tablet   5   . diclofenac sodium (VOLTAREN) 1 % GEL   Topical   Apply 1 application topically 4 (four) times daily.   100 g   0   . Fluticasone-Salmeterol (ADVAIR DISKUS) 250-50 MCG/DOSE AEPB   Inhalation   Inhale 1 puff into the lungs every 12 (twelve) hours.   60 each   6   . lisinopril-hydrochlorothiazide (PRINZIDE,ZESTORETIC) 20-25 MG per tablet   Oral   Take 1 tablet by mouth daily.   90 tablet  3   . metFORMIN (GLUCOPHAGE) 500 MG tablet   Oral   Take 1 tablet (500 mg total) by mouth 2 (two) times daily with a meal.   180 tablet   6   . montelukast (SINGULAIR) 10 MG tablet   Oral   Take 1 tablet (10 mg total) by mouth at bedtime.   90 tablet   3   . omeprazole (PRILOSEC) 20 MG capsule      TAKE ONE CAPSULE BY MOUTH TWICE A DAY   60 capsule   3   . traMADol (ULTRAM) 50 MG tablet   Oral   Take 1 tablet (50 mg total) by mouth every 8 (eight) hours as needed for pain.   90 tablet   3    BP 154/105  Pulse 82  Temp(Src) 97.3 F (36.3 C)  Resp 13  SpO2 98% Physical Exam  Constitutional: He is oriented to person, place, and time. He appears well-developed and well-nourished. No distress.  HENT:  Head:  Normocephalic and atraumatic.  Right Ear: Hearing normal.  Left Ear: Hearing normal.  Nose: Nose normal.  Mouth/Throat: Oropharynx is clear and moist and mucous membranes are normal.  Eyes: Conjunctivae and EOM are normal. Pupils are equal, round, and reactive to light.  Neck: Normal range of motion. Neck supple.  Cardiovascular: Regular rhythm, S1 normal and S2 normal.  Exam reveals no gallop and no friction rub.   No murmur heard. Pulmonary/Chest: Effort normal and breath sounds normal. No respiratory distress. He exhibits no tenderness.  Abdominal: Soft. Normal appearance and bowel sounds are normal. There is no hepatosplenomegaly. There is no tenderness. There is no rebound, no guarding, no tenderness at McBurney's point and negative Murphy's sign. No hernia.  Musculoskeletal: Normal range of motion.  Neurological: He is alert and oriented to person, place, and time. He has normal strength. No cranial nerve deficit or sensory deficit. Coordination normal. GCS eye subscore is 4. GCS verbal subscore is 5. GCS motor subscore is 6.  Skin: Skin is warm, dry and intact. No rash noted. No cyanosis.  Psychiatric: He has a normal mood and affect. His speech is normal and behavior is normal. Thought content normal.    ED Course   Procedures (including critical care time)  Labs Reviewed  CBC WITH DIFFERENTIAL - Abnormal; Notable for the following:    Neutrophils Relative % 38 (*)    Monocytes Relative 15 (*)    Monocytes Absolute 1.2 (*)    Eosinophils Relative 6 (*)    All other components within normal limits  COMPREHENSIVE METABOLIC PANEL - Abnormal; Notable for the following:    CO2 16 (*)    Glucose, Bld 136 (*)    BUN 25 (*)    Creatinine, Ser 2.20 (*)    GFR calc non Af Amer 29 (*)    GFR calc Af Amer 33 (*)    All other components within normal limits  GLUCOSE, CAPILLARY - Abnormal; Notable for the following:    Glucose-Capillary 137 (*)    All other components within normal  limits  URINALYSIS, ROUTINE W REFLEX MICROSCOPIC - Abnormal; Notable for the following:    Ketones, ur 15 (*)    Leukocytes, UA MODERATE (*)    All other components within normal limits  URINE MICROSCOPIC-ADD ON - Abnormal; Notable for the following:    Squamous Epithelial / LPF FEW (*)    All other components within normal limits  CULTURE, BLOOD (ROUTINE X 2)  CULTURE,  BLOOD (ROUTINE X 2)  LIPASE, BLOOD  POCT I-STAT TROPONIN I  CG4 I-STAT (LACTIC ACID)   Dg Abd Acute W/chest  03/21/2013   *RADIOLOGY REPORT*  Clinical Data: Cough and emesis  ACUTE ABDOMEN SERIES (ABDOMEN 2 VIEW & CHEST 1 VIEW)  Comparison:  Chest radiograph December 21, 2011  Findings:  PA chest: There is mild scarring in the right base.  There is underlying emphysema.  There is no edema or consolidation.  Heart size is normal.  Pulmonary vascularity is consistent with the underlying emphysema and stable.  There are metallic foreign bodies on the left.  No adenopathy.  Supine and upright abdomen:  There is moderate stool in the colon. Bowel gas pattern is unremarkable.  No obstruction or free air. There are phleboliths in the pelvis.  There is degenerative change in the thoracic spine.  IMPRESSION: The bowel gas pattern is unremarkable.  There is no edema or consolidation.  Underlying emphysema present.   Original Report Authenticated By: Bretta Bang, M.D.   Diagnosis: 1. Nausea and vomiting 2. Urinary trichomoniasis  MDM  Patient presents to the ER for evaluation of nausea, vomiting with chills. Patient is not febrile. His abdominal exam is benign and nontender. No concern for acute surgical process. Patient feeling better after IV hydration. He does have elevated creatinine over his baseline, but no further workup necessary for this. Will require followup with primary doctor to ensure resolution.  The remainder of the workup was unremarkable. Patient has not had any infectious findings on his chest x-ray. Abdominal x-ray  unremarkable. Blood work was all within normal limits as well. Urinalysis shows leukocytes with 3-6 white blood cells and Trichomonas present. Can't rule out concomitant infections with the Trichomonas. Will treat with Rocephin, Zithromax and Flagyl. Follow up with primary doctor. Await blood and urine cultures.  Gilda Crease, MD 03/22/13 [name]

## 2013-03-22 NOTE — ED Notes (Signed)
Results of lactic acid shown to Dr. Radford Pax

## 2013-03-22 NOTE — ED Provider Notes (Signed)
I saw and evaluated the patient, reviewed the resident's note and I agree with the findings and plan.   .Face to face Exam:  General:  Awake HEENT:  Atraumatic Resp:  Wheezes bilaterally Abd:  Nondistended Neuro:No focal weakness  Nelia Shi, MD 03/22/13 1410

## 2013-03-23 DIAGNOSIS — I10 Essential (primary) hypertension: Secondary | ICD-10-CM

## 2013-03-23 DIAGNOSIS — G9340 Encephalopathy, unspecified: Secondary | ICD-10-CM | POA: Diagnosis present

## 2013-03-23 DIAGNOSIS — N390 Urinary tract infection, site not specified: Secondary | ICD-10-CM | POA: Diagnosis present

## 2013-03-23 DIAGNOSIS — E875 Hyperkalemia: Secondary | ICD-10-CM | POA: Diagnosis present

## 2013-03-23 DIAGNOSIS — E119 Type 2 diabetes mellitus without complications: Secondary | ICD-10-CM | POA: Diagnosis present

## 2013-03-23 DIAGNOSIS — K219 Gastro-esophageal reflux disease without esophagitis: Secondary | ICD-10-CM | POA: Diagnosis present

## 2013-03-23 DIAGNOSIS — J441 Chronic obstructive pulmonary disease with (acute) exacerbation: Secondary | ICD-10-CM | POA: Diagnosis present

## 2013-03-23 DIAGNOSIS — E860 Dehydration: Secondary | ICD-10-CM | POA: Diagnosis present

## 2013-03-23 DIAGNOSIS — F17200 Nicotine dependence, unspecified, uncomplicated: Secondary | ICD-10-CM | POA: Diagnosis present

## 2013-03-23 DIAGNOSIS — M6282 Rhabdomyolysis: Secondary | ICD-10-CM | POA: Diagnosis present

## 2013-03-23 DIAGNOSIS — E872 Acidosis: Secondary | ICD-10-CM | POA: Diagnosis present

## 2013-03-23 DIAGNOSIS — N179 Acute kidney failure, unspecified: Secondary | ICD-10-CM | POA: Diagnosis present

## 2013-03-23 DIAGNOSIS — I252 Old myocardial infarction: Secondary | ICD-10-CM | POA: Diagnosis not present

## 2013-03-23 DIAGNOSIS — G40909 Epilepsy, unspecified, not intractable, without status epilepticus: Secondary | ICD-10-CM | POA: Diagnosis present

## 2013-03-23 DIAGNOSIS — I129 Hypertensive chronic kidney disease with stage 1 through stage 4 chronic kidney disease, or unspecified chronic kidney disease: Secondary | ICD-10-CM | POA: Diagnosis present

## 2013-03-23 LAB — CBC
MCH: 27.1 pg (ref 26.0–34.0)
MCHC: 33 g/dL (ref 30.0–36.0)
MCV: 82 fL (ref 78.0–100.0)
Platelets: 291 10*3/uL (ref 150–400)
RDW: 15 % (ref 11.5–15.5)
WBC: 6.1 10*3/uL (ref 4.0–10.5)

## 2013-03-23 LAB — BASIC METABOLIC PANEL
CO2: 23 mEq/L (ref 19–32)
Calcium: 9.4 mg/dL (ref 8.4–10.5)
Calcium: 8.7 mg/dL (ref 8.4–10.5)
Creatinine, Ser: 1.66 mg/dL — ABNORMAL HIGH (ref 0.50–1.35)
GFR calc Af Amer: 50 mL/min — ABNORMAL LOW (ref >90)
GFR calc non Af Amer: 43 mL/min — ABNORMAL LOW (ref >90)
Sodium: 137 mEq/L (ref 135–145)

## 2013-03-23 LAB — GLUCOSE, CAPILLARY: Glucose-Capillary: 129 mg/dL — ABNORMAL HIGH (ref 70–99)

## 2013-03-23 LAB — TSH: TSH: 0.65 u[IU]/mL (ref 0.350–4.500)

## 2013-03-23 LAB — URINE CULTURE

## 2013-03-23 MED ORDER — SODIUM CHLORIDE 0.9 % IV SOLN: INTRAVENOUS | Status: AC

## 2013-03-23 MED ORDER — SODIUM POLYSTYRENE SULFONATE 15 GM/60ML PO SUSP
30.0000 g | Freq: Once | ORAL | Status: AC
  Administered 2013-03-23: 30 g via ORAL
  Filled 2013-03-23: qty 120

## 2013-03-23 MED ORDER — PREDNISONE 20 MG PO TABS
40.0000 mg | ORAL_TABLET | Freq: Every day | ORAL | Status: DC
  Administered 2013-03-24: 40 mg via ORAL
  Filled 2013-03-23 (×2): qty 2

## 2013-03-23 MED ORDER — AMLODIPINE BESYLATE 5 MG PO TABS
5.0000 mg | ORAL_TABLET | Freq: Every day | ORAL | Status: DC
  Administered 2013-03-23 – 2013-03-24 (×2): 5 mg via ORAL
  Filled 2013-03-23 (×2): qty 1

## 2013-03-23 NOTE — Care Management Note (Addendum)
    Page 1 of 1   03/24/2013     2:37:12 PM   CARE MANAGEMENT NOTE 03/24/2013  Patient:  Luis Carter, Luis Carter   Account Number:  [id number]  Date Initiated:  03/23/2013  Documentation initiated by:  Letha Cape  Subjective/Objective Assessment:   dx confusion and sob, hx copd  admit as observaiton- lives with spouse.     Action/Plan:   Anticipated DC Date:  03/24/2013   Anticipated DC Plan:  HOME/SELF CARE      DC Planning Services  CM consult      Choice offered to / List presented to:             Status of service:  Completed, signed off Medicare Important Message given?   (If response is "NO", the following Medicare IM given date fields will be blank) Date Medicare IM given:   Date Additional Medicare IM given:    Discharge Disposition:  HOME/SELF CARE  Per UR Regulation:  Reviewed for med. necessity/level of care/duration of stay  If discussed at Long Length of Stay Meetings, dates discussed:    Comments:  03/24/13 14:35 Letha Cape RN, BSN [phone] patient dc to home, no needs anticpated.  03/23/13 14:26 Letha Cape RN, BSN [phone] patient lives with spouse, patient was ambulating with RN. Patient states he has medication coverage and transportation.  No needs anticipated.

## 2013-03-23 NOTE — Progress Notes (Signed)
PATIENT DETAILS Name: Luis Carter Age: 67 y.o. Sex: male Date of Birth: 1946-04-07 Admit Date: 03/22/2013 Admitting Physician Kathlen Mody, MD ZOX:[id number] [name], MD  Subjective: Seems to be back to his usual baseline. Denies any shortness of breath. Denies any major complaints. 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 Repeat potassium level now normal  COPD exacerbation - Better, continue with steroids nebs and oxygen  Disposition: Remain inpatient- home in a.m.  DVT Prophylaxis: Prophylactic  Heparin  Code Status: Full code   Family Communication Spoke to spouse over the phone  Procedures: None  CONSULTS:  None   MEDICATIONS: Scheduled Meds: . allopurinol  200 mg Oral Daily  . diclofenac sodium  1 application Topical QID  . enoxaparin (LOVENOX) injection  40 mg Subcutaneous Q24H  . levofloxacin  500 mg Oral Daily  . mometasone-formoterol  2 puff Inhalation BID  . montelukast  10 mg Oral QHS  . pantoprazole  40 mg Oral Daily  . [START ON 03/24/2013] predniSONE  40 mg Oral Q breakfast  . sodium chloride  3 mL Intravenous Q12H   Continuous Infusions:  PRN Meds:.albuterol, ondansetron (ZOFRAN) IV, ondansetron  Antibiotics: Anti-infectives   Start     Dose/Rate Route Frequency Ordered Stop   03/22/13 1800  levofloxacin (LEVAQUIN) tablet 500 mg     500 mg Oral Daily 03/22/13 1752         PHYSICAL EXAM: Vital signs in last  24 hours: Filed Vitals:   03/22/13 2109 03/23/13 0037 03/23/13 0534 03/23/13 1403  BP:   131/79 138/77  Pulse:   60 82  Temp:   98.3 F (36.8 C) 97.6 F (36.4 C)  TempSrc:   Oral Oral  Resp:   20 18  Height:  5\' 8"  (1.727 m)    Weight:  80 kg (176 lb 5.9 oz)    SpO2: 98%  100% 99%    Weight change:  Filed Weights   03/23/13 0037  Weight: 80 kg (176 lb 5.9 oz)   Body mass index is 26.82 kg/(m^2).   Gen Exam: Awake and alert with clear speech.   Neck: Supple, No JVD.   Chest: B/L Clear.   CVS: S1 S2 Regular, no murmurs.  Abdomen: soft, BS +, non tender, non distended.  Extremities: no edema, lower extremities warm to touch. Neurologic: Non Focal.   Skin: No Rash.  Wounds: N/A.    Intake/Output from previous day:  Intake/Output Summary (Last 24 hours) at 03/23/13 1709 Last data filed at 03/23/13 0100  Gross per 24 hour  Intake      3 ml  Output    401 ml  Net   -398 ml     LAB RESULTS: CBC  Recent Labs Lab 03/21/13 1937 03/22/13 0844 03/22/13 1730 03/23/13 0600  WBC 8.2 6.6 3.2* 6.1  HGB 13.6 13.3 12.7* 12.5*  HCT 40.5 38.7* 38.1* 37.9*  PLT 308 292 292 291  MCV 81.2 81.6 82.5 82.0  MCH 27.3 28.1  27.5 27.1  MCHC 33.6 34.4 33.3 33.0  RDW 14.7 14.7 14.9 15.0  LYMPHSABS 3.3 1.3  --   --   MONOABS 1.2* 1.2*  --   --   EOSABS 0.5 0.4  --   --   BASOSABS 0.0 0.0  --   --     Chemistries   Recent Labs Lab 03/21/13 1937 03/22/13 0844 03/22/13 1730 03/22/13 2227 03/23/13 0600 03/23/13 1503  NA 135 134*  --   --  136 137  K 4.5 5.3*  --  5.1 5.3* 4.0  CL 101 101  --   --  103 103  CO2 16* 22  --   --  23 24  GLUCOSE 136* 88  --   --  133* 135*  BUN 25* 21  --   --  20 18  CREATININE 2.20* 1.91* 1.71*  --  1.66* 1.57*  CALCIUM 9.7 9.2  --   --  9.4 8.7    CBG:  Recent Labs Lab 03/21/13 1941 03/22/13 0911 03/22/13 1033 03/23/13 0743  GLUCAP 137* 55* 131* 129*    GFR Estimated Creatinine Clearance: 43 ml/min (by C-G formula based on Cr  of 1.57).  Coagulation profile No results found for this basename: INR, PROTIME,  in the last 168 hours  Cardiac Enzymes No results found for this basename: CK, CKMB, TROPONINI, MYOGLOBIN,  in the last 168 hours  No components found with this basename: POCBNP,  No results found for this basename: DDIMER,  in the last 72 hours  Recent Labs  03/22/13 1730  HGBA1C 5.9*   No results found for this basename: CHOL, HDL, LDLCALC, TRIG, CHOLHDL, LDLDIRECT,  in the last 72 hours  Recent Labs  03/23/13 0600  TSH 0.650   No results found for this basename: VITAMINB12, FOLATE, FERRITIN, TIBC, IRON, RETICCTPCT,  in the last 72 hours  Recent Labs  03/21/13 1937  LIPASE 11    Urine Studies No results found for this basename: UACOL, UAPR, USPG, UPH, UTP, UGL, UKET, UBIL, UHGB, UNIT, UROB, ULEU, UEPI, UWBC, URBC, UBAC, CAST, CRYS, UCOM, BILUA,  in the last 72 hours  MICROBIOLOGY: Recent Results (from the past 240 hour(s))  CULTURE, BLOOD (ROUTINE X 2)     Status: None   Collection Time    03/21/13 10:15 PM      Result Value Range Status   Specimen Description BLOOD ARM RIGHT   Final   Special Requests BOTTLES DRAWN AEROBIC AND ANAEROBIC 10CC   Final   Culture  Setup Time 03/22/2013 04:15   Final   Culture     Final   Value:        BLOOD CULTURE RECEIVED NO GROWTH TO DATE CULTURE WILL BE HELD FOR 5 DAYS BEFORE ISSUING A FINAL NEGATIVE REPORT   Report Status PENDING   Incomplete  CULTURE, BLOOD (ROUTINE X 2)     Status: None   Collection Time    03/21/13 10:22 PM      Result Value Range Status   Specimen Description BLOOD ARM LEFT   Final   Special Requests BOTTLES DRAWN AEROBIC ONLY 4CC   Final   Culture  Setup Time 03/22/2013 04:16   Final   Culture     Final   Value:        BLOOD CULTURE RECEIVED NO GROWTH TO DATE CULTURE WILL BE HELD FOR 5 DAYS BEFORE ISSUING A FINAL NEGATIVE REPORT   Report Status PENDING   Incomplete  RADIOLOGY STUDIES/RESULTS: Ct Head Wo  Contrast  03/22/2013   *RADIOLOGY REPORT*  Clinical Data: Acute mental status changes with confusion. Headache.  Nausea and vomiting.  CT HEAD WITHOUT CONTRAST  Technique:  Contiguous axial images were obtained from the base of the skull through the vertex without contrast.  Comparison: CT head 12/21/2011.  Findings: Ventricular system normal in size and appearance for age. Mild changes of small vessel disease of the white matter diffusely, unchanged.  Physiologic calcifications in the basal ganglia, unchanged.  Moderate cerebellar atrophy, unchanged.  No mass lesion.  No midline shift.  No acute hemorrhage or hematoma.  No extra-axial fluid collections.  No evidence of acute infarction. No significant interval change.  No skull fracture or other focal osseous abnormality involving the skull.  Visualized paranasal sinuses, bilateral mastoid air cells, and bilateral middle ear cavities well-aerated.  Bilateral carotid siphon atherosclerosis.  IMPRESSION:  1.  No acute intracranial abnormality. 2.  Stable moderate cerebellar atrophy and mild chronic microvascular ischemic changes of the white matter.   Original Report Authenticated By: Hulan Saas, M.D.   Dg Abd Acute W/chest  03/21/2013   *RADIOLOGY REPORT*  Clinical Data: Cough and emesis  ACUTE ABDOMEN SERIES (ABDOMEN 2 VIEW & CHEST 1 VIEW)  Comparison:  Chest radiograph December 21, 2011  Findings:  PA chest: There is mild scarring in the right base.  There is underlying emphysema.  There is no edema or consolidation.  Heart size is normal.  Pulmonary vascularity is consistent with the underlying emphysema and stable.  There are metallic foreign bodies on the left.  No adenopathy.  Supine and upright abdomen:  There is moderate stool in the colon. Bowel gas pattern is unremarkable.  No obstruction or free air. There are phleboliths in the pelvis.  There is degenerative change in the thoracic spine.  IMPRESSION: The bowel gas pattern is unremarkable.  There is  no edema or consolidation.  Underlying emphysema present.   Original Report Authenticated By: Bretta Bang, M.D.    Jeoffrey Massed, MD  [facility] Pager:[phone]  If 7PM-7AM, please contact night-coverage [email or web address] Password [email or web address] 03/23/2013, 5:09 PM   LOS: 1 day

## 2013-03-24 DIAGNOSIS — E119 Type 2 diabetes mellitus without complications: Secondary | ICD-10-CM

## 2013-03-24 LAB — GLUCOSE, CAPILLARY: Glucose-Capillary: 86 mg/dL (ref 70–99)

## 2013-03-24 LAB — TROPONIN I: Troponin I: <0.3 ng/mL (ref <0.30)

## 2013-03-24 MED ORDER — ASPIRIN EC 81 MG PO TBEC: 81.0000 mg | DELAYED_RELEASE_TABLET | Freq: Every day | ORAL | Status: DC

## 2013-03-24 MED ORDER — LEVOFLOXACIN 250 MG PO TABS: 250.0000 mg | ORAL_TABLET | Freq: Every day | ORAL | Status: DC

## 2013-03-24 MED ORDER — AMLODIPINE BESYLATE 5 MG PO TABS: 5.0000 mg | ORAL_TABLET | Freq: Every day | ORAL | Status: DC

## 2013-03-24 MED ORDER — PREDNISONE 10 MG PO TABS: ORAL_TABLET | ORAL | Status: DC

## 2013-03-24 MED ORDER — LEVOFLOXACIN 250 MG PO TABS
250.0000 mg | ORAL_TABLET | Freq: Every day | ORAL | Status: DC
  Administered 2013-03-24: 250 mg via ORAL
  Filled 2013-03-24: qty 1

## 2013-03-24 NOTE — Progress Notes (Signed)
NURSING PROGRESS NOTE  Luis Carter [id number] Discharge Data: 03/24/2013 1:52 PM Attending Provider: Maretta Bees, MD JYN:[id number] [name], MD     Renford Dills to be D/C'd Home per MD order.  Discussed with the patient the After Visit Summary and all questions fully answered. All IV's discontinued with no bleeding noted. All belongings returned to patient for patient to take home.   Last Vital Signs:  Blood pressure 119/72, pulse 81, temperature 98.6 F (37 C), temperature source Oral, resp. rate 20, height 5\' 8"  (1.727 m), weight 80 kg (176 lb 5.9 oz), SpO2 98.00%.  Discharge Medication List   Medication List    STOP taking these medications       lisinopril-hydrochlorothiazide 20-25 MG per tablet  Commonly known as:  PRINZIDE,ZESTORETIC      TAKE these medications       albuterol 108 (90 BASE) MCG/ACT inhaler  Commonly known as:  PROVENTIL HFA;VENTOLIN HFA  Inhale 2 puffs into the lungs every 6 (six) hours as needed for wheezing.     allopurinol 100 MG tablet  Commonly known as:  ZYLOPRIM  Take 200 mg by mouth daily.     amLODipine 5 MG tablet  Commonly known as:  NORVASC  Take 1 tablet (5 mg total) by mouth daily.     aspirin EC 81 MG tablet  Take 1 tablet (81 mg total) by mouth daily.     colchicine 0.6 MG tablet  Take 0.6 mg by mouth 2 (two) times daily as needed (for gout flare-ups).     cyclobenzaprine 10 MG tablet  Commonly known as:  FLEXERIL  Take 1 tablet (10 mg total) by mouth at bedtime.     diclofenac sodium 1 % Gel  Commonly known as:  VOLTAREN  Apply 1 application topically 4 (four) times daily.     Fluticasone-Salmeterol 250-50 MCG/DOSE Aepb  Commonly known as:  ADVAIR DISKUS  Inhale 1 puff into the lungs every 12 (twelve) hours.     levofloxacin 250 MG tablet  Commonly known as:  LEVAQUIN  Take 1 tablet (250 mg total) by mouth daily.     metFORMIN 500 MG tablet  Commonly known as:  GLUCOPHAGE  Take 1 tablet (500 mg  total) by mouth 2 (two) times daily with a meal.     montelukast 10 MG tablet  Commonly known as:  SINGULAIR  Take 1 tablet (10 mg total) by mouth at bedtime.     omeprazole 20 MG capsule  Commonly known as:  PRILOSEC  Take 20 mg by mouth daily.     predniSONE 10 MG tablet  Commonly known as:  DELTASONE  - Take 4 tablets for 2 days, then,  - Take 3 tablets  for 2 days, then,   - Take 2 tablets for 2 days, then,  - Take 1 tablet for 1 day and then stop     traMADol 50 MG tablet  Commonly known as:  ULTRAM  Take 1 tablet (50 mg total) by mouth every 8 (eight) hours as needed for pain.

## 2013-03-24 NOTE — Discharge Summary (Signed)
PATIENT DETAILS Name: Luis Carter Age: 67 y.o. Sex: male Date of Birth: 03/25/1946 MRN: [id number]. Admit Date: 03/22/2013 Admitting Physician: Kathlen Mody, MD WUJ:[id number],AOZHY [name], MD  Recommendations for Outpatient Follow-up:  1. May need to discontinue Metformin, creatinine close to 1.6. Please recheck BMET at next visit. 2. Would not restart ACEI-given hyperkalemia 3. Needs ongoing counseling regarding tobacco use 4. Dementia work up to be done as outpatient 5. Consider cardiology referral if chest pain continues  PRIMARY DISCHARGE DIAGNOSIS:  Active Problems:   Dehydration   Acute on chronic renal failure   Hyperkalemia      PAST MEDICAL HISTORY: Past Medical History  Diagnosis Date  . COPD 04/14/2007  . HYPERTENSION 04/14/2007  . LEG PAIN, RIGHT 05/19/2008  . LOW BACK PAIN 04/14/2007  . PANCREATITIS, HX OF 04/14/2007  . Coronary artery disease   . Gout   . GI bleed   . DIABETES MELLITUS, TYPE II 04/14/2007  . SEIZURE DISORDER 04/14/2007    "used to have them when I was young" (03/22/2013)  . Pneumonia     "twice" (03/22/2013)  . Shortness of breath     "related to asthma" (03/22/2013)  . GERD (gastroesophageal reflux disease)   . Myocardial infarction     ~ 2001/notes 09/13/2001 (03/22/2013)  . ASTHMA     since childhood/notes 10/04/2001 (03/22/2013)    DISCHARGE MEDICATIONS:   Medication List    STOP taking these medications       lisinopril-hydrochlorothiazide 20-25 MG per tablet  Commonly known as:  PRINZIDE,ZESTORETIC      TAKE these medications       albuterol 108 (90 BASE) MCG/ACT inhaler  Commonly known as:  PROVENTIL HFA;VENTOLIN HFA  Inhale 2 puffs into the lungs every 6 (six) hours as needed for wheezing.     allopurinol 100 MG tablet  Commonly known as:  ZYLOPRIM  Take 200 mg by mouth daily.     amLODipine 5 MG tablet  Commonly known as:  NORVASC  Take 1 tablet (5 mg total) by mouth daily.     aspirin EC 81 MG tablet  Take 1  tablet (81 mg total) by mouth daily.     colchicine 0.6 MG tablet  Take 0.6 mg by mouth 2 (two) times daily as needed (for gout flare-ups).     cyclobenzaprine 10 MG tablet  Commonly known as:  FLEXERIL  Take 1 tablet (10 mg total) by mouth at bedtime.     diclofenac sodium 1 % Gel  Commonly known as:  VOLTAREN  Apply 1 application topically 4 (four) times daily.     Fluticasone-Salmeterol 250-50 MCG/DOSE Aepb  Commonly known as:  ADVAIR DISKUS  Inhale 1 puff into the lungs every 12 (twelve) hours.     levofloxacin 250 MG tablet  Commonly known as:  LEVAQUIN  Take 1 tablet (250 mg total) by mouth daily.     metFORMIN 500 MG tablet  Commonly known as:  GLUCOPHAGE  Take 1 tablet (500 mg total) by mouth 2 (two) times daily with a meal.     montelukast 10 MG tablet  Commonly known as:  SINGULAIR  Take 1 tablet (10 mg total) by mouth at bedtime.     omeprazole 20 MG capsule  Commonly known as:  PRILOSEC  Take 20 mg by mouth daily.     predniSONE 10 MG tablet  Commonly known as:  DELTASONE  - Take 4 tablets for 2 days, then,  - Take 3 tablets  for  2 days, then,   - Take 2 tablets for 2 days, then,  - Take 1 tablet for 1 day and then stop     traMADol 50 MG tablet  Commonly known as:  ULTRAM  Take 1 tablet (50 mg total) by mouth every 8 (eight) hours as needed for pain.        ALLERGIES:  No Known Allergies  BRIEF HPI:  See H&P, Labs, Consult and Test reports for all details in brief, patient was admitted for confusion and vomiting that started 2 days prior to admission. Patient was also found to have SOB with wheezing. Patient was then admitted for further evaluation and treatment.  CONSULTATIONS:   None  PERTINENT RADIOLOGIC STUDIES: Ct Head Wo Contrast  03/22/2013   *RADIOLOGY REPORT*  Clinical Data: Acute mental status changes with confusion. Headache.  Nausea and vomiting.  CT HEAD WITHOUT CONTRAST  Technique:  Contiguous axial images were obtained from the  base of the skull through the vertex without contrast.  Comparison: CT head 12/21/2011.  Findings: Ventricular system normal in size and appearance for age. Mild changes of small vessel disease of the white matter diffusely, unchanged.  Physiologic calcifications in the basal ganglia, unchanged.  Moderate cerebellar atrophy, unchanged.  No mass lesion.  No midline shift.  No acute hemorrhage or hematoma.  No extra-axial fluid collections.  No evidence of acute infarction. No significant interval change.  No skull fracture or other focal osseous abnormality involving the skull.  Visualized paranasal sinuses, bilateral mastoid air cells, and bilateral middle ear cavities well-aerated.  Bilateral carotid siphon atherosclerosis.  IMPRESSION:  1.  No acute intracranial abnormality. 2.  Stable moderate cerebellar atrophy and mild chronic microvascular ischemic changes of the white matter.   Original Report Authenticated By: Hulan Saas, M.D.   Dg Abd Acute W/chest  03/21/2013   *RADIOLOGY REPORT*  Clinical Data: Cough and emesis  ACUTE ABDOMEN SERIES (ABDOMEN 2 VIEW & CHEST 1 VIEW)  Comparison:  Chest radiograph December 21, 2011  Findings:  PA chest: There is mild scarring in the right base.  There is underlying emphysema.  There is no edema or consolidation.  Heart size is normal.  Pulmonary vascularity is consistent with the underlying emphysema and stable.  There are metallic foreign bodies on the left.  No adenopathy.  Supine and upright abdomen:  There is moderate stool in the colon. Bowel gas pattern is unremarkable.  No obstruction or free air. There are phleboliths in the pelvis.  There is degenerative change in the thoracic spine.  IMPRESSION: The bowel gas pattern is unremarkable.  There is no edema or consolidation.  Underlying emphysema present.   Original Report Authenticated By: Bretta Bang, M.D.     PERTINENT LAB RESULTS: CBC:  Recent Labs  03/22/13 1730 03/23/13 0600  WBC 3.2* 6.1   HGB 12.7* 12.5*  HCT 38.1* 37.9*  PLT 292 291   CMET CMP     Component Value Date/Time   NA 137 03/23/2013 1503   K 4.0 03/23/2013 1503   CL 103 03/23/2013 1503   CO2 24 03/23/2013 1503   GLUCOSE 135* 03/23/2013 1503   GLUCOSE 103* 06/03/2006 1131   BUN 18 03/23/2013 1503   CREATININE 1.57* 03/23/2013 1503   CALCIUM 8.7 03/23/2013 1503   PROT 7.7 03/21/2013 1937   ALBUMIN 4.1 03/21/2013 1937   AST 19 03/21/2013 1937   ALT 7 03/21/2013 1937   ALKPHOS 68 03/21/2013 1937   BILITOT 0.4 03/21/2013 1937  GFRNONAA 43* 03/23/2013 1503   GFRAA 50* 03/23/2013 1503    GFR Estimated Creatinine Clearance: 43 ml/min (by C-G formula based on Cr of 1.57).  Recent Labs  03/21/13 1937  LIPASE 11    Recent Labs  03/22/13 0844 03/24/13 1024  CKTOTAL 415*  --   TROPONINI  --  <0.30   No components found with this basename: POCBNP,  No results found for this basename: DDIMER,  in the last 72 hours  Recent Labs  03/22/13 1730  HGBA1C 5.9*   No results found for this basename: CHOL, HDL, LDLCALC, TRIG, CHOLHDL, LDLDIRECT,  in the last 72 hours  Recent Labs  03/23/13 0600  TSH 0.650   No results found for this basename: VITAMINB12, FOLATE, FERRITIN, TIBC, IRON, RETICCTPCT,  in the last 72 hours Coags: No results found for this basename: PT, INR,  in the last 72 hours Microbiology: Recent Results (from the past 240 hour(s))  CULTURE, BLOOD (ROUTINE X 2)     Status: None   Collection Time    03/21/13 10:15 PM      Result Value Range Status   Specimen Description BLOOD ARM RIGHT   Final   Special Requests BOTTLES DRAWN AEROBIC AND ANAEROBIC 10CC   Final   Culture  Setup Time 03/22/2013 04:15   Final   Culture     Final   Value:        BLOOD CULTURE RECEIVED NO GROWTH TO DATE CULTURE WILL BE HELD FOR 5 DAYS BEFORE ISSUING A FINAL NEGATIVE REPORT   Report Status PENDING   Incomplete  CULTURE, BLOOD (ROUTINE X 2)     Status: None   Collection Time    03/21/13 10:22 PM      Result  Value Range Status   Specimen Description BLOOD ARM LEFT   Final   Special Requests BOTTLES DRAWN AEROBIC ONLY 4CC   Final   Culture  Setup Time 03/22/2013 04:16   Final   Culture     Final   Value:        BLOOD CULTURE RECEIVED NO GROWTH TO DATE CULTURE WILL BE HELD FOR 5 DAYS BEFORE ISSUING A FINAL NEGATIVE REPORT   Report Status PENDING   Incomplete  URINE CULTURE     Status: None   Collection Time    03/21/13 11:16 PM      Result Value Range Status   Specimen Description URINE, CLEAN CATCH   Final   Special Requests NONE   Final   Culture  Setup Time 03/22/2013 21:49   Final   Colony Count NO GROWTH   Final   Culture NO GROWTH   Final   Report Status 03/23/2013 FINAL   Final     BRIEF HOSPITAL COURSE:  Acute encephalopathy:  - From underlying UTI/worsening renal failure  - CT of the head on admission was negative  - This seems to have resolved following IV fluids and IV antibiotics  -per family they have noted some memory loss issues over the past month-suspect mild cognitive dysfunction vs early dementia, will defer further work up to be done in the outpatient setting -patient was ambulated by RN-easily able to ambulate without major issues. -Per wife and daughter at bedside, current mentation is at baseline.  UTI  -UA was consistent with UTI, however Urine cs neg. - Was on  Levaquin while inpatient, will continue with Levaquin on discharge-for COPD exac.  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 counselled  extensively -lungs are completely clear on exam  Atypical Left sided chest pain -reproducible with palpation-suspect musculoskeletal-troponins were negative -have deferred further work up during this time -place on ASA on discharge  HTN -stopped HCTZ/Lisinopril-given ARF/Hyperkalemia -BP controlled with Amlodipine-continue on discharge  TODAY-DAY OF DISCHARGE:  Subjective:   Luis Carter today has no headache,no chest abdominal pain,no new weakness tingling or numbness, feels much better wants to go home today.   Objective:   Blood pressure 119/72, pulse 81, temperature 98.6 F (37 C), temperature source Oral, resp. rate 20, height 5\' 8"  (1.727 m), weight 80 kg (176 lb 5.9 oz), SpO2 98.00%.  Intake/Output Summary (Last 24 hours) at 03/24/13 1245 Last data filed at 03/24/13 0930  Gross per 24 hour  Intake    480 ml  Output    502 ml  Net    -22 ml   Filed Weights   03/23/13 0037  Weight: 80 kg (176 lb 5.9 oz)    Exam Awake Alert, Oriented *3, No new F.N deficits, Normal affect [place].AT,PERRAL Supple Neck,No JVD, No cervical lymphadenopathy appriciated.  Symmetrical Chest wall movement, Good air movement bilaterally, CTAB RRR,No Gallops,Rubs or new Murmurs, No Parasternal Heave +ve B.Sounds, Abd Soft, Non tender, No organomegaly appriciated, No rebound -guarding or rigidity. No Cyanosis, Clubbing or edema, No new Rash or bruise  DISCHARGE CONDITION: Stable  DISPOSITION: Home  DISCHARGE INSTRUCTIONS:    Activity:  As tolerated   Diet recommendation: Heart Healthy diet   Discharge Orders   Future Appointments Provider Department Dept Phone   05/27/2013 8:30 AM Gordy Savers, MD [facility] [phone]   Future Orders Complete By Expires     Call MD for:  difficulty breathing, headache or visual disturbances  As directed     Diet - low sodium heart healthy  As directed     Increase activity slowly  As directed        Follow-up  Information   Follow up with Rogelia Boga, MD. Schedule an appointment as soon as possible for a visit in 1 week.   Contact information:   [address] [place] [address] [phone]      Total Time spent on discharge equals 45 minutes.  SignedJeoffrey Massed 03/24/2013 12:45 PM

## 2013-03-28 LAB — CULTURE, BLOOD (ROUTINE X 2): Culture: NO GROWTH

## 2013-04-19 ENCOUNTER — Other Ambulatory Visit: Payer: Self-pay | Admitting: Internal Medicine

## 2013-04-23 ENCOUNTER — Other Ambulatory Visit: Payer: Self-pay | Admitting: Internal Medicine

## 2013-05-06 ENCOUNTER — Other Ambulatory Visit: Payer: Self-pay | Admitting: Internal Medicine

## 2013-05-21 ENCOUNTER — Other Ambulatory Visit: Payer: Self-pay | Admitting: Internal Medicine

## 2013-05-24 ENCOUNTER — Other Ambulatory Visit: Payer: Self-pay | Admitting: Internal Medicine

## 2013-05-27 ENCOUNTER — Encounter: Payer: Self-pay | Admitting: Internal Medicine

## 2013-05-27 ENCOUNTER — Ambulatory Visit (INDEPENDENT_AMBULATORY_CARE_PROVIDER_SITE_OTHER): Payer: PRIVATE HEALTH INSURANCE | Admitting: Internal Medicine

## 2013-05-27 ENCOUNTER — Other Ambulatory Visit: Payer: Self-pay | Admitting: *Deleted

## 2013-05-27 VITALS — BP 100/60 | HR 56 | Temp 97.4°F | Resp 20 | Wt 187.0 lb

## 2013-05-27 DIAGNOSIS — J449 Chronic obstructive pulmonary disease, unspecified: Secondary | ICD-10-CM

## 2013-05-27 DIAGNOSIS — J4489 Other specified chronic obstructive pulmonary disease: Secondary | ICD-10-CM

## 2013-05-27 DIAGNOSIS — R569 Unspecified convulsions: Secondary | ICD-10-CM

## 2013-05-27 DIAGNOSIS — E119 Type 2 diabetes mellitus without complications: Secondary | ICD-10-CM

## 2013-05-27 DIAGNOSIS — Z0000 Encounter for general adult medical examination without abnormal findings: Secondary | ICD-10-CM

## 2013-05-27 DIAGNOSIS — Z23 Encounter for immunization: Secondary | ICD-10-CM

## 2013-05-27 DIAGNOSIS — I10 Essential (primary) hypertension: Secondary | ICD-10-CM

## 2013-05-27 LAB — BASIC METABOLIC PANEL
CO2: 26 mEq/L (ref 19–32)
Calcium: 9 mg/dL (ref 8.4–10.5)
Chloride: 101 mEq/L (ref 96–112)
Creatinine, Ser: 1.9 mg/dL — ABNORMAL HIGH (ref 0.4–1.5)
Glucose, Bld: 88 mg/dL (ref 70–99)

## 2013-05-27 MED ORDER — AMLODIPINE BESYLATE 5 MG PO TABS: 5.0000 mg | ORAL_TABLET | Freq: Every day | ORAL | Status: DC

## 2013-05-27 MED ORDER — ALLOPURINOL 100 MG PO TABS: ORAL_TABLET | ORAL | Status: DC

## 2013-05-27 MED ORDER — CYCLOBENZAPRINE HCL 10 MG PO TABS: ORAL_TABLET | ORAL | Status: DC

## 2013-05-27 MED ORDER — DICLOFENAC SODIUM 1 % TD GEL: 1.0000 "application " | Freq: Four times a day (QID) | TRANSDERMAL | Status: DC

## 2013-05-27 MED ORDER — OMEPRAZOLE 20 MG PO CPDR: 20.0000 mg | DELAYED_RELEASE_CAPSULE | Freq: Every day | ORAL | Status: DC

## 2013-05-27 MED ORDER — ALBUTEROL SULFATE HFA 108 (90 BASE) MCG/ACT IN AERS: 2.0000 | INHALATION_SPRAY | Freq: Four times a day (QID) | RESPIRATORY_TRACT | Status: DC | PRN

## 2013-05-27 MED ORDER — FLUTICASONE-SALMETEROL 250-50 MCG/DOSE IN AEPB: INHALATION_SPRAY | RESPIRATORY_TRACT | Status: DC

## 2013-05-27 MED ORDER — TRAMADOL HCL 50 MG PO TABS: ORAL_TABLET | ORAL | Status: DC

## 2013-05-27 MED ORDER — MONTELUKAST SODIUM 10 MG PO TABS: 10.0000 mg | ORAL_TABLET | Freq: Every day | ORAL | Status: DC

## 2013-05-27 MED ORDER — METFORMIN HCL 500 MG PO TABS: 500.0000 mg | ORAL_TABLET | Freq: Two times a day (BID) | ORAL | Status: DC

## 2013-05-27 MED ORDER — COLCHICINE 0.6 MG PO TABS: 0.6000 mg | ORAL_TABLET | Freq: Two times a day (BID) | ORAL | Status: DC | PRN

## 2013-05-27 NOTE — Patient Instructions (Signed)
Limit your sodium (Salt) intake   Please check your hemoglobin A1c every 3 months  Smoking tobacco is very bad for your health. You should stop smoking immediately.  Schedule your colonoscopy to help detect colon cancer.    It is important that you exercise regularly, at least 20 minutes 3 to 4 times per week.  If you develop chest pain or shortness of breath seek  medical attention.  Please see your eye doctor yearly to check for diabetic eye damage

## 2013-05-27 NOTE — Progress Notes (Signed)
Subjective:    Patient ID: Luis Carter, male    DOB: December 21, 1945, 67 y.o.   MRN: [id number]  HPI  67 year old patient who is seen today for followup. He has history of type 2 diabetes which has been managed on metformin therapy alone. He was hospitalized in July and hemoglobin A1c was less than 6 at that time. He was admitted for exacerbation of COPD with confusion and dehydration. Lisinopril was discontinued due to hyperkalemia. He has hypertension now controlled on amlodipine. His only complaint today is right knee pain present for about 2 weeks. Ms. Luis Carter status has been stable. He states that he continues to smoke about 2 cigarettes daily. He has a history of gout which has been stable. He's had no recurrent chest pain.  Past Medical History  Diagnosis Date  . COPD 04/14/2007  . HYPERTENSION 04/14/2007  . LEG PAIN, RIGHT 05/19/2008  . LOW BACK PAIN 04/14/2007  . PANCREATITIS, HX OF 04/14/2007  . Coronary artery disease   . Gout   . GI bleed   . DIABETES MELLITUS, TYPE II 04/14/2007  . SEIZURE DISORDER 04/14/2007    "used to have them when I was young" (03/22/2013)  . Pneumonia     "twice" (03/22/2013)  . Shortness of breath     "related to asthma" (03/22/2013)  . GERD (gastroesophageal reflux disease)   . Myocardial infarction     ~ 2001/notes 09/13/2001 (03/22/2013)  . ASTHMA     since childhood/notes 10/04/2001 (03/22/2013)    History   Social History  . Marital Status: Married    Spouse Name: N/A    Number of Children: N/A  . Years of Education: N/A   Occupational History  . Not on file.   Social History Main Topics  . Smoking status: Current Every Day Smoker -- 0.25 packs/day for 39 years    Types: Cigarettes  . Smokeless tobacco: Never Used  . Alcohol Use: No     Comment: 03/22/2013 "quit drinking years ago; never had problem w/it"  . Drug Use: No  . Sexual Activity: No   Other Topics Concern  . Not on file   Social History Narrative  . No narrative on file     Past Surgical History  Procedure Laterality Date  . Esophagogastroduodenoscopy  12/21/2011    Procedure: ESOPHAGOGASTRODUODENOSCOPY (EGD);  Surgeon: Louis Meckel, MD;  Location: [facility] ENDOSCOPY;  Service: Endoscopy;  Laterality: N/A;  . Coronary angioplasty with stent placement      Family History  Problem Relation Age of Onset  . Heart disease Sister   . Heart disease Brother   . Heart disease Brother   . Heart disease Brother   . Heart disease Brother     No Known Allergies  Current Outpatient Prescriptions on File Prior to Visit  Medication Sig Dispense Refill  . ADVAIR DISKUS 250-50 MCG/DOSE AEPB INHALE 1 PUFF INTO THE LUNGS EVERY 12 HOURS.  60 each  6  . albuterol (PROVENTIL HFA;VENTOLIN HFA) 108 (90 BASE) MCG/ACT inhaler Inhale 2 puffs into the lungs every 6 (six) hours as needed for wheezing.  3 Inhaler  5  . allopurinol (ZYLOPRIM) 100 MG tablet TAKE 2 TABLETS BY MOUTH EVERY DAY  60 tablet  3  . amLODipine (NORVASC) 5 MG tablet Take 1 tablet (5 mg total) by mouth daily.  30 tablet  0  . aspirin EC 81 MG tablet Take 1 tablet (81 mg total) by mouth daily.  30 tablet  0  .  colchicine 0.6 MG tablet Take 0.6 mg by mouth 2 (two) times daily as needed (for gout flare-ups).      . cyclobenzaprine (FLEXERIL) 10 MG tablet TAKE 1 TABLET BY MOUTH AT BEDTIME  30 tablet  5  . diclofenac sodium (VOLTAREN) 1 % GEL Apply 1 application topically 4 (four) times daily.  100 g  0  . metFORMIN (GLUCOPHAGE) 500 MG tablet Take 1 tablet (500 mg total) by mouth 2 (two) times daily with a meal.  180 tablet  6  . montelukast (SINGULAIR) 10 MG tablet Take 1 tablet (10 mg total) by mouth at bedtime.  90 tablet  3  . omeprazole (PRILOSEC) 20 MG capsule Take 20 mg by mouth daily.       No current facility-administered medications on file prior to visit.    BP 100/60  Pulse 56  Temp(Src) 97.4 F (36.3 C) (Oral)  Resp 20  Wt 187 lb (84.823 kg)  BMI 28.44 kg/m2       Review of Systems   Constitutional: Negative for fever, chills, appetite change and fatigue.  HENT: Negative for hearing loss, ear pain, congestion, sore throat, trouble swallowing, neck stiffness, dental problem, voice change and tinnitus.   Eyes: Negative for pain, discharge and visual disturbance.  Respiratory: Negative for cough, chest tightness, wheezing and stridor.   Cardiovascular: Negative for chest pain, palpitations and leg swelling.  Gastrointestinal: Negative for nausea, vomiting, abdominal pain, diarrhea, constipation, blood in stool and abdominal distention.  Genitourinary: Negative for urgency, hematuria, flank pain, discharge, difficulty urinating and genital sores.  Musculoskeletal: Positive for joint swelling. Negative for myalgias, back pain, arthralgias and gait problem.       Right knee pain  Skin: Negative for rash.  Neurological: Negative for dizziness, syncope, speech difficulty, weakness, numbness and headaches.  Hematological: Negative for adenopathy. Does not bruise/bleed easily.  Psychiatric/Behavioral: Negative for behavioral problems and dysphoric mood. The patient is not nervous/anxious.        Objective:   Physical Exam  Constitutional: He is oriented to person, place, and time. He appears well-developed.  Blood pressure 100/60  HENT:  Head: Normocephalic.  Right Ear: External ear normal.  Left Ear: External ear normal.  Eyes: Conjunctivae and EOM are normal.  Neck: Normal range of motion.  Cardiovascular: Normal rate and normal heart sounds.   Pulmonary/Chest: No respiratory distress. He has wheezes.  Faint scattered rhonchi and wheezing  Abdominal: Bowel sounds are normal.  Musculoskeletal: Normal range of motion. He exhibits no edema and no tenderness.  Right knee slightly warm to touch  Neurological: He is alert and oriented to person, place, and time.  Psychiatric: He has a normal mood and affect. His behavior is normal.          Assessment & Plan:    Hypertension well controlled Diabetes mellitus type control Right knee pain. We'll continue up Tylenol and topical diclofenac History of hyperkalemia and renal insufficiency. We'll check renal indices and electrolytes COPD/asthma. Total smoking cessation encouraged

## 2013-06-02 NOTE — Progress Notes (Signed)
Quick Note:  Spoke with caregiver and she is aware. Verbalized understanding of stopping metformin. Pt has a follow up in Jan. ______

## 2013-07-24 ENCOUNTER — Other Ambulatory Visit: Payer: Self-pay | Admitting: Internal Medicine

## 2013-07-26 ENCOUNTER — Encounter: Payer: Self-pay | Admitting: Internal Medicine

## 2013-08-11 ENCOUNTER — Other Ambulatory Visit: Payer: Self-pay | Admitting: Internal Medicine

## 2013-08-22 ENCOUNTER — Other Ambulatory Visit: Payer: Self-pay | Admitting: Internal Medicine

## 2013-08-29 ENCOUNTER — Ambulatory Visit: Payer: PRIVATE HEALTH INSURANCE | Admitting: Internal Medicine

## 2013-08-30 ENCOUNTER — Ambulatory Visit (INDEPENDENT_AMBULATORY_CARE_PROVIDER_SITE_OTHER): Payer: PRIVATE HEALTH INSURANCE | Admitting: Internal Medicine

## 2013-08-30 ENCOUNTER — Encounter: Payer: Self-pay | Admitting: Internal Medicine

## 2013-08-30 VITALS — BP 110/70 | HR 60 | Resp 20 | Wt 194.0 lb

## 2013-08-30 DIAGNOSIS — R569 Unspecified convulsions: Secondary | ICD-10-CM

## 2013-08-30 DIAGNOSIS — Z23 Encounter for immunization: Secondary | ICD-10-CM

## 2013-08-30 DIAGNOSIS — M79609 Pain in unspecified limb: Secondary | ICD-10-CM

## 2013-08-30 DIAGNOSIS — J449 Chronic obstructive pulmonary disease, unspecified: Secondary | ICD-10-CM

## 2013-08-30 DIAGNOSIS — E119 Type 2 diabetes mellitus without complications: Secondary | ICD-10-CM

## 2013-08-30 DIAGNOSIS — I10 Essential (primary) hypertension: Secondary | ICD-10-CM

## 2013-08-30 MED ORDER — ATORVASTATIN CALCIUM 20 MG PO TABS: 20.0000 mg | ORAL_TABLET | Freq: Every day | ORAL | Status: DC

## 2013-08-30 NOTE — Patient Instructions (Signed)
Smoking tobacco is very bad for your health.   Please see your eye doctor yearly to check for diabetic eye damage  Return in 6 months for follow-up    It is important that you exercise regularly, at least 20 minutes 3 to 4 times per week.  If you develop chest pain or shortness of breath seek  medical attention.

## 2013-08-30 NOTE — Progress Notes (Signed)
Subjective:    Patient ID: Luis Carter, male    DOB: 1945-08-29, 68 y.o.   MRN: [id number]  HPI 68 year old patient who is seen today for followup. He has a history of COPD and states that he has discontinued tobacco use as of the first of the year. He does have a history of hypertension and type 2 diabetes. Diabetes has been well-controlled on metformin therapy alone. His only complaint today is right knee pain which has been chronic. Denies any cardiopulmonary complaints. He does note some occasional wheezing.  Past Medical History  Diagnosis Date  . COPD 04/14/2007  . HYPERTENSION 04/14/2007  . LEG PAIN, RIGHT 05/19/2008  . LOW BACK PAIN 04/14/2007  . PANCREATITIS, HX OF 04/14/2007  . Coronary artery disease   . Gout   . GI bleed   . DIABETES MELLITUS, TYPE II 04/14/2007  . SEIZURE DISORDER 04/14/2007    "used to have them when I was young" (03/22/2013)  . Pneumonia     "twice" (03/22/2013)  . Shortness of breath     "related to asthma" (03/22/2013)  . GERD (gastroesophageal reflux disease)   . Myocardial infarction     ~ 2001/notes 09/13/2001 (03/22/2013)  . ASTHMA     since childhood/notes 10/04/2001 (03/22/2013)    History   Social History  . Marital Status: Married    Spouse Name: N/A    Number of Children: N/A  . Years of Education: N/A   Occupational History  . Not on file.   Social History Main Topics  . Smoking status: Current Every Day Smoker -- 0.25 packs/day for 39 years    Types: Cigarettes  . Smokeless tobacco: Never Used  . Alcohol Use: No     Comment: 03/22/2013 "quit drinking years ago; never had problem w/it"  . Drug Use: No  . Sexual Activity: No   Other Topics Concern  . Not on file   Social History Narrative  . No narrative on file    Past Surgical History  Procedure Laterality Date  . Esophagogastroduodenoscopy  12/21/2011    Procedure: ESOPHAGOGASTRODUODENOSCOPY (EGD);  Surgeon: Louis Meckel, MD;  Location: [facility] ENDOSCOPY;  Service:  Endoscopy;  Laterality: N/A;  . Coronary angioplasty with stent placement      Family History  Problem Relation Age of Onset  . Heart disease Sister   . Heart disease Brother   . Heart disease Brother   . Heart disease Brother   . Heart disease Brother     No Known Allergies  Current Outpatient Prescriptions on File Prior to Visit  Medication Sig Dispense Refill  . albuterol (PROVENTIL HFA;VENTOLIN HFA) 108 (90 BASE) MCG/ACT inhaler Inhale 2 puffs into the lungs every 6 (six) hours as needed for wheezing.  3 Inhaler  5  . allopurinol (ZYLOPRIM) 100 MG tablet TAKE 2 TABLETS BY MOUTH EVERY DAY  180 tablet  3  . amLODipine (NORVASC) 5 MG tablet Take 1 tablet (5 mg total) by mouth daily.  30 tablet  5  . aspirin EC 81 MG tablet Take 1 tablet (81 mg total) by mouth daily.  30 tablet  0  . COLCRYS 0.6 MG tablet TAKE 1 TABLET BY MOUTH TWICE A DAY AS NEEDED FOR GOUT FLARE-UPS  60 tablet  2  . cyclobenzaprine (FLEXERIL) 10 MG tablet TAKE 1 TABLET BY MOUTH AT BEDTIME  30 tablet  5  . Fluticasone-Salmeterol (ADVAIR DISKUS) 250-50 MCG/DOSE AEPB INHALE 1 PUFF INTO THE LUNGS EVERY 12 HOURS.  180 each  3  . metFORMIN (GLUCOPHAGE) 500 MG tablet Take 1 tablet (500 mg total) by mouth 2 (two) times daily with a meal.  180 tablet  1  . montelukast (SINGULAIR) 10 MG tablet Take 1 tablet (10 mg total) by mouth at bedtime.  90 tablet  3  . naproxen (NAPROSYN) 500 MG tablet TAKE 1 TABLET BY MOUTH TWICE A DAY WITH MEALS  180 tablet  3  . omeprazole (PRILOSEC) 20 MG capsule Take 1 capsule (20 mg total) by mouth daily.  90 capsule  3  . traMADol (ULTRAM) 50 MG tablet TAKE 1 TABLET BY MOUTH EVERY 8 HOURS AS NEEDED FOR PAIN  90 tablet  2  . VOLTAREN 1 % GEL APPLY TO AFFECTED AREA 4 TIMES A DAY  100 g  1   No current facility-administered medications on file prior to visit.    BP 110/70  Pulse 60  Resp 20  Wt 194 lb (87.998 kg)  SpO2 97%       Review of Systems  Constitutional: Negative for fever,  chills, appetite change and fatigue.  HENT: Negative for congestion, dental problem, ear pain, hearing loss, sore throat, tinnitus, trouble swallowing and voice change.   Eyes: Negative for pain, discharge and visual disturbance.  Respiratory: Positive for wheezing. Negative for cough, chest tightness and stridor.   Cardiovascular: Negative for chest pain, palpitations and leg swelling.  Gastrointestinal: Negative for nausea, vomiting, abdominal pain, diarrhea, constipation, blood in stool and abdominal distention.  Genitourinary: Negative for urgency, hematuria, flank pain, discharge, difficulty urinating and genital sores.  Musculoskeletal: Positive for arthralgias and gait problem. Negative for back pain, joint swelling, myalgias and neck stiffness.  Skin: Negative for rash.  Neurological: Negative for dizziness, syncope, speech difficulty, weakness, numbness and headaches.  Hematological: Negative for adenopathy. Does not bruise/bleed easily.  Psychiatric/Behavioral: Negative for behavioral problems and dysphoric mood. The patient is not nervous/anxious.        Objective:   Physical Exam  Constitutional: He is oriented to person, place, and time. He appears well-developed.  HENT:  Head: Normocephalic.  Right Ear: External ear normal.  Left Ear: External ear normal.  Eyes: Conjunctivae and EOM are normal.  Neck: Normal range of motion.  Cardiovascular: Normal rate and normal heart sounds.   Pedal pulses diminished  Pulmonary/Chest: Breath sounds normal.  Abdominal: Bowel sounds are normal.  Musculoskeletal: Normal range of motion. He exhibits no edema and no tenderness.  Neurological: He is alert and oriented to person, place, and time.  Psychiatric: He has a normal mood and affect. His behavior is normal.          Assessment & Plan:   Diabetes mellitus. Will check a hemoglobin A1c. We'll start statin therapy Hypertension well controlled COPD/asthma. Current smoker  cessation encouraged  CPX 6 months

## 2013-08-30 NOTE — Progress Notes (Signed)
Pre-visit discussion using our clinic review tool. No additional management support is needed unless otherwise documented below in the visit note.  

## 2013-08-31 ENCOUNTER — Telehealth: Payer: Self-pay

## 2013-08-31 ENCOUNTER — Telehealth: Payer: Self-pay | Admitting: Internal Medicine

## 2013-08-31 NOTE — Telephone Encounter (Signed)
Relevant patient education mailed to patient.  

## 2013-09-23 ENCOUNTER — Telehealth: Payer: Self-pay | Admitting: Internal Medicine

## 2013-09-28 NOTE — Telephone Encounter (Signed)
Relevant patient education mailed to patient.  

## 2013-11-18 ENCOUNTER — Other Ambulatory Visit: Payer: Self-pay | Admitting: Internal Medicine

## 2013-12-16 ENCOUNTER — Other Ambulatory Visit: Payer: Self-pay | Admitting: Internal Medicine

## 2013-12-19 ENCOUNTER — Other Ambulatory Visit: Payer: Self-pay | Admitting: Internal Medicine

## 2014-02-22 ENCOUNTER — Other Ambulatory Visit: Payer: Self-pay | Admitting: Internal Medicine

## 2014-02-27 ENCOUNTER — Encounter: Payer: Self-pay | Admitting: Internal Medicine

## 2014-02-27 ENCOUNTER — Ambulatory Visit (INDEPENDENT_AMBULATORY_CARE_PROVIDER_SITE_OTHER): Payer: PRIVATE HEALTH INSURANCE | Admitting: Internal Medicine

## 2014-02-27 VITALS — BP 100/60 | HR 72 | Temp 97.7°F | Resp 18 | Ht 68.0 in | Wt 194.0 lb

## 2014-02-27 DIAGNOSIS — J449 Chronic obstructive pulmonary disease, unspecified: Secondary | ICD-10-CM

## 2014-02-27 DIAGNOSIS — E119 Type 2 diabetes mellitus without complications: Secondary | ICD-10-CM

## 2014-02-27 DIAGNOSIS — M545 Low back pain, unspecified: Secondary | ICD-10-CM

## 2014-02-27 DIAGNOSIS — I10 Essential (primary) hypertension: Secondary | ICD-10-CM

## 2014-02-27 DIAGNOSIS — J4489 Other specified chronic obstructive pulmonary disease: Secondary | ICD-10-CM

## 2014-02-27 LAB — BASIC METABOLIC PANEL
BUN: 22 mg/dL (ref 6–23)
CHLORIDE: 106 meq/L (ref 96–112)
CO2: 27 meq/L (ref 19–32)
CREATININE: 1.8 mg/dL — AB (ref 0.4–1.5)
Calcium: 8.9 mg/dL (ref 8.4–10.5)
GFR: 46.96 mL/min — ABNORMAL LOW (ref >60.00)
GLUCOSE: 99 mg/dL (ref 70–99)
POTASSIUM: 4.8 meq/L (ref 3.5–5.1)
Sodium: 140 mEq/L (ref 135–145)

## 2014-02-27 LAB — HEMOGLOBIN A1C: HEMOGLOBIN A1C: 6.6 % — AB (ref 4.6–6.5)

## 2014-02-27 MED ORDER — LISINOPRIL 20 MG PO TABS: 20.0000 mg | ORAL_TABLET | Freq: Every day | ORAL | Status: DC

## 2014-02-27 MED ORDER — TRAMADOL HCL 50 MG PO TABS: ORAL_TABLET | ORAL | Status: DC

## 2014-02-27 NOTE — Progress Notes (Signed)
Pre visit review using our clinic review tool, if applicable. No additional management support is needed unless otherwise documented below in the visit note. 

## 2014-02-27 NOTE — Progress Notes (Signed)
Subjective:    Patient ID: Luis Carter, male    DOB: 1946-05-03, 68 y.o.   MRN: [id number]  HPI 68 year old patient who is seen today for his six-month followup.  He has a history of low back pain and has had a flare over the past week.  He also complains of some burning involving the plantar surface of his left foot over the metatarsal heads.  No leg weakness. He was seen in January, but failed to drop by to the lab for a hemoglobin A1c.  This has been well controlled.  11 months ago.  Hemoglobin A1c was 5. 9.  He has treated hypertension and a history of COPD and asthma, which has been stable. He states he has seen doctor within the last year, but is unclear about the details. Remains on atorvastatin. Continues to smoke. He remains on metformin therapy.  This was discontinued earlier due to elevated creatinine.  Followup lab in January was not performed  Past Medical History  Diagnosis Date  . COPD 04/14/2007  . HYPERTENSION 04/14/2007  . LEG PAIN, RIGHT 05/19/2008  . LOW BACK PAIN 04/14/2007  . PANCREATITIS, HX OF 04/14/2007  . Coronary artery disease   . Gout   . GI bleed   . DIABETES MELLITUS, TYPE II 04/14/2007  . SEIZURE DISORDER 04/14/2007    "used to have them when I was young" (03/22/2013)  . Pneumonia     "twice" (03/22/2013)  . Shortness of breath     "related to asthma" (03/22/2013)  . GERD (gastroesophageal reflux disease)   . Myocardial infarction     ~ 2001/notes 09/13/2001 (03/22/2013)  . ASTHMA     since childhood/notes 10/04/2001 (03/22/2013)    History   Social History  . Marital Status: Married    Spouse Name: N/A    Number of Children: N/A  . Years of Education: N/A   Occupational History  . Not on file.   Social History Main Topics  . Smoking status: Current Every Day Smoker -- 0.25 packs/day for 39 years    Types: Cigarettes  . Smokeless tobacco: Never Used  . Alcohol Use: No     Comment: 03/22/2013 "quit drinking years ago; never had problem w/it"   . Drug Use: No  . Sexual Activity: No   Other Topics Concern  . Not on file   Social History Narrative  . No narrative on file    Past Surgical History  Procedure Laterality Date  . Esophagogastroduodenoscopy  12/21/2011    Procedure: ESOPHAGOGASTRODUODENOSCOPY (EGD);  Surgeon: Louis Meckel, MD;  Location: [facility] ENDOSCOPY;  Service: Endoscopy;  Laterality: N/A;  . Coronary angioplasty with stent placement      Family History  Problem Relation Age of Onset  . Heart disease Sister   . Heart disease Brother   . Heart disease Brother   . Heart disease Brother   . Heart disease Brother     No Known Allergies  Current Outpatient Prescriptions on File Prior to Visit  Medication Sig Dispense Refill  . albuterol (PROVENTIL HFA;VENTOLIN HFA) 108 (90 BASE) MCG/ACT inhaler Inhale 2 puffs into the lungs every 6 (six) hours as needed for wheezing.  3 Inhaler  5  . allopurinol (ZYLOPRIM) 100 MG tablet TAKE 2 TABLETS BY MOUTH EVERY DAY  60 tablet  5  . amLODipine (NORVASC) 5 MG tablet TAKE 1 TABLET BY MOUTH EVERY DAY  90 tablet  1  . aspirin EC 81 MG tablet Take 1 tablet (  81 mg total) by mouth daily.  30 tablet  0  . atorvastatin (LIPITOR) 20 MG tablet Take 1 tablet (20 mg total) by mouth daily.  90 tablet  3  . COLCRYS 0.6 MG tablet TAKE 1 TABLET BY MOUTH TWICE A DAY AS NEEDED FOR GOUT FLARE-UPS  60 tablet  2  . cyclobenzaprine (FLEXERIL) 10 MG tablet TAKE 1 TABLET BY MOUTH AT BEDTIME  30 tablet  5  . Fluticasone-Salmeterol (ADVAIR DISKUS) 250-50 MCG/DOSE AEPB INHALE 1 PUFF INTO THE LUNGS EVERY 12 HOURS.  180 each  3  . lisinopril-hydrochlorothiazide (PRINZIDE,ZESTORETIC) 20-25 MG per tablet TAKE 1 TABLET BY MOUTH DAILY.  90 tablet  0  . metFORMIN (GLUCOPHAGE) 500 MG tablet TAKE 1 TABLET BY MOUTH TWICE A DAY  180 tablet  1  . montelukast (SINGULAIR) 10 MG tablet Take 1 tablet (10 mg total) by mouth at bedtime.  90 tablet  3  . naproxen (NAPROSYN) 500 MG tablet TAKE 1 TABLET BY MOUTH TWICE  A DAY WITH MEALS  180 tablet  3  . omeprazole (PRILOSEC) 20 MG capsule Take 1 capsule (20 mg total) by mouth daily.  90 capsule  3  . traMADol (ULTRAM) 50 MG tablet TAKE 1 TABLET BY MOUTH EVERY 8 HOURS AS NEEDED FOR PAIN  90 tablet  2  . VOLTAREN 1 % GEL APPLY TO AFFECTED AREA 4 TIMES A DAY  100 g  1   No current facility-administered medications on file prior to visit.    BP 100/60  Pulse 72  Temp(Src) 97.7 F (36.5 C) (Oral)  Resp 18  Ht 5\' 8"  (1.727 m)  Wt 194 lb (87.998 kg)  BMI 29.50 kg/m2      Review of Systems  Constitutional: Negative for fever, chills, appetite change and fatigue.  HENT: Negative for congestion, dental problem, ear pain, hearing loss, sore throat, tinnitus, trouble swallowing and voice change.   Eyes: Negative for pain, discharge and visual disturbance.  Respiratory: Negative for cough, chest tightness, wheezing and stridor.   Cardiovascular: Negative for chest pain, palpitations and leg swelling.  Gastrointestinal: Negative for nausea, vomiting, abdominal pain, diarrhea, constipation, blood in stool and abdominal distention.  Genitourinary: Negative for urgency, hematuria, flank pain, discharge, difficulty urinating and genital sores.  Musculoskeletal: Positive for back pain. Negative for arthralgias, gait problem, joint swelling, myalgias and neck stiffness.  Skin: Negative for rash.  Neurological: Negative for dizziness, syncope, speech difficulty, weakness, numbness and headaches.  Hematological: Negative for adenopathy. Does not bruise/bleed easily.  Psychiatric/Behavioral: Negative for behavioral problems and dysphoric mood. The patient is not nervous/anxious.        Objective:   Physical Exam  Constitutional: He is oriented to person, place, and time. He appears well-developed.  HENT:  Head: Normocephalic.  Right Ear: External ear normal.  Left Ear: External ear normal.  Eyes: Conjunctivae and EOM are normal.  Neck: Normal range of motion.   Cardiovascular: Normal rate and normal heart sounds.   Pulmonary/Chest: Effort normal.  Few scattered rhonchi  Abdominal: Bowel sounds are normal.  Musculoskeletal: Normal range of motion. He exhibits no edema and no tenderness.   Negative straight leg test  Neurological: He is alert and oriented to person, place, and time.  Normal ankle flexion and extension Achilles reflexes blunted  Psychiatric: He has a normal mood and affect. His behavior is normal.          Assessment & Plan:   Low back pain.  This has been a chronic  problem and has flared over the past week.  We'll continue present regimen.  Tramadol refilled Diabetes mellitus.  We'll check a hemoglobin A1c. COPD stable Seizure disorder, stable Chronic kidney disease.  We'll check renal indices. Hypertension.  Blood pressure is in a low-normal range.  We'll discontinue hydrochlorothiazide

## 2014-02-27 NOTE — Patient Instructions (Signed)
Most patients with low back pain will improve with time over the next two to 6 weeks.  Keep active but avoid any activities that cause pain.  Apply moist heat to the low back area several times daily.   Please check your hemoglobin A1c every 3 months    It is important that you exercise regularly, at least 20 minutes 3 to 4 times per week.  If you develop chest pain or shortness of breath seek  medical attention.

## 2014-03-23 ENCOUNTER — Other Ambulatory Visit: Payer: Self-pay | Admitting: Internal Medicine

## 2014-03-23 DIAGNOSIS — E119 Type 2 diabetes mellitus without complications: Secondary | ICD-10-CM

## 2014-05-17 ENCOUNTER — Other Ambulatory Visit: Payer: Self-pay | Admitting: Internal Medicine

## 2014-06-13 ENCOUNTER — Other Ambulatory Visit: Payer: Self-pay | Admitting: Internal Medicine

## 2014-06-16 ENCOUNTER — Other Ambulatory Visit: Payer: Self-pay | Admitting: Internal Medicine

## 2014-08-04 ENCOUNTER — Encounter: Payer: Self-pay | Admitting: Internal Medicine

## 2014-08-11 ENCOUNTER — Other Ambulatory Visit: Payer: Self-pay | Admitting: Internal Medicine

## 2014-09-04 ENCOUNTER — Encounter: Payer: Self-pay | Admitting: Internal Medicine

## 2014-09-04 ENCOUNTER — Telehealth: Payer: Self-pay | Admitting: Internal Medicine

## 2014-09-04 ENCOUNTER — Ambulatory Visit (INDEPENDENT_AMBULATORY_CARE_PROVIDER_SITE_OTHER): Payer: 59 | Admitting: Internal Medicine

## 2014-09-04 ENCOUNTER — Encounter: Payer: Self-pay | Admitting: *Deleted

## 2014-09-04 VITALS — BP 130/80 | HR 48 | Temp 97.4°F | Resp 20 | Ht 65.5 in | Wt 198.0 lb

## 2014-09-04 DIAGNOSIS — M1000 Idiopathic gout, unspecified site: Secondary | ICD-10-CM

## 2014-09-04 DIAGNOSIS — Z0000 Encounter for general adult medical examination without abnormal findings: Secondary | ICD-10-CM

## 2014-09-04 DIAGNOSIS — N179 Acute kidney failure, unspecified: Secondary | ICD-10-CM

## 2014-09-04 DIAGNOSIS — N189 Chronic kidney disease, unspecified: Secondary | ICD-10-CM | POA: Diagnosis not present

## 2014-09-04 DIAGNOSIS — E785 Hyperlipidemia, unspecified: Secondary | ICD-10-CM

## 2014-09-04 DIAGNOSIS — R569 Unspecified convulsions: Secondary | ICD-10-CM

## 2014-09-04 DIAGNOSIS — E0821 Diabetes mellitus due to underlying condition with diabetic nephropathy: Secondary | ICD-10-CM | POA: Diagnosis not present

## 2014-09-04 LAB — LIPID PANEL
Cholesterol: 118 mg/dL (ref 0–200)
HDL: 42.7 mg/dL (ref >39.00)
LDL CALC: 65 mg/dL (ref 0–99)
NONHDL: 75.3
Total CHOL/HDL Ratio: 3
Triglycerides: 52 mg/dL (ref 0.0–149.0)
VLDL: 10.4 mg/dL (ref 0.0–40.0)

## 2014-09-04 LAB — CBC WITH DIFFERENTIAL/PLATELET
BASOS PCT: 0.7 % (ref 0.0–3.0)
Basophils Absolute: 0 10*3/uL (ref 0.0–0.1)
EOS PCT: 9.3 % — AB (ref 0.0–5.0)
Eosinophils Absolute: 0.6 10*3/uL (ref 0.0–0.7)
HCT: 39.8 % (ref 39.0–52.0)
Hemoglobin: 12.6 g/dL — ABNORMAL LOW (ref 13.0–17.0)
LYMPHS ABS: 2.2 10*3/uL (ref 0.7–4.0)
Lymphocytes Relative: 33.2 % (ref 12.0–46.0)
MCHC: 31.6 g/dL (ref 30.0–36.0)
MCV: 86.9 fl (ref 78.0–100.0)
MONO ABS: 0.7 10*3/uL (ref 0.1–1.0)
MONOS PCT: 10.5 % (ref 3.0–12.0)
NEUTROS PCT: 46.3 % (ref 43.0–77.0)
Neutro Abs: 3.1 10*3/uL (ref 1.4–7.7)
Platelets: 238 10*3/uL (ref 150.0–400.0)
RBC: 4.58 Mil/uL (ref 4.22–5.81)
RDW: 16 % — ABNORMAL HIGH (ref 11.5–15.5)
WBC: 6.6 10*3/uL (ref 4.0–10.5)

## 2014-09-04 LAB — URIC ACID: Uric Acid, Serum: 5.2 mg/dL (ref 4.0–7.8)

## 2014-09-04 LAB — COMPREHENSIVE METABOLIC PANEL
ALT: 15 U/L (ref 0–53)
AST: 24 U/L (ref 0–37)
Albumin: 3.9 g/dL (ref 3.5–5.2)
Alkaline Phosphatase: 78 U/L (ref 39–117)
BUN: 21 mg/dL (ref 6–23)
CO2: 26 mEq/L (ref 19–32)
Calcium: 9 mg/dL (ref 8.4–10.5)
Chloride: 107 mEq/L (ref 96–112)
Creatinine, Ser: 1.7 mg/dL — ABNORMAL HIGH (ref 0.4–1.5)
GFR: 50.01 mL/min — ABNORMAL LOW (ref >60.00)
Glucose, Bld: 92 mg/dL (ref 70–99)
Potassium: 5.9 mEq/L — ABNORMAL HIGH (ref 3.5–5.1)
Sodium: 139 mEq/L (ref 135–145)
Total Bilirubin: 0.4 mg/dL (ref 0.2–1.2)
Total Protein: 7.3 g/dL (ref 6.0–8.3)

## 2014-09-04 LAB — MICROALBUMIN / CREATININE URINE RATIO
CREATININE, U: 72 mg/dL
MICROALB/CREAT RATIO: 2.5 mg/g (ref 0.0–30.0)
Microalb, Ur: 1.8 mg/dL (ref 0.0–1.9)

## 2014-09-04 LAB — TSH: TSH: 2.45 u[IU]/mL (ref 0.35–4.50)

## 2014-09-04 LAB — HEMOGLOBIN A1C: Hgb A1c MFr Bld: 6.6 % — ABNORMAL HIGH (ref 4.6–6.5)

## 2014-09-04 MED ORDER — ALLOPURINOL 100 MG PO TABS: 200.0000 mg | ORAL_TABLET | Freq: Every day | ORAL | Status: DC

## 2014-09-04 MED ORDER — DICLOFENAC SODIUM 1 % TD GEL: TRANSDERMAL | Status: DC

## 2014-09-04 MED ORDER — MONTELUKAST SODIUM 10 MG PO TABS
10.0000 mg | ORAL_TABLET | Freq: Every day | ORAL | Status: DC
Start: 2014-09-04 — End: 2015-11-05

## 2014-09-04 MED ORDER — ZOSTER VACCINE LIVE 19400 UNT/0.65ML ~~LOC~~ SOLR: 0.6500 mL | Freq: Once | SUBCUTANEOUS | Status: DC

## 2014-09-04 MED ORDER — COLCHICINE 0.6 MG PO TABS
ORAL_TABLET | ORAL | Status: DC
Start: 2014-09-04 — End: 2015-03-05

## 2014-09-04 MED ORDER — OMEPRAZOLE 20 MG PO CPDR: 20.0000 mg | DELAYED_RELEASE_CAPSULE | Freq: Every day | ORAL | Status: DC

## 2014-09-04 MED ORDER — ASPIRIN EC 81 MG PO TBEC: 81.0000 mg | DELAYED_RELEASE_TABLET | Freq: Every day | ORAL | Status: DC

## 2014-09-04 MED ORDER — AMLODIPINE BESYLATE 5 MG PO TABS: 5.0000 mg | ORAL_TABLET | Freq: Every day | ORAL | Status: DC

## 2014-09-04 MED ORDER — FLUTICASONE-SALMETEROL 250-50 MCG/DOSE IN AEPB: INHALATION_SPRAY | RESPIRATORY_TRACT | Status: DC

## 2014-09-04 MED ORDER — METFORMIN HCL 500 MG PO TABS: 500.0000 mg | ORAL_TABLET | Freq: Two times a day (BID) | ORAL | Status: DC

## 2014-09-04 MED ORDER — LISINOPRIL 20 MG PO TABS: 20.0000 mg | ORAL_TABLET | Freq: Every day | ORAL | Status: DC

## 2014-09-04 MED ORDER — TRAMADOL HCL 50 MG PO TABS: ORAL_TABLET | ORAL | Status: DC

## 2014-09-04 MED ORDER — CYCLOBENZAPRINE HCL 10 MG PO TABS: ORAL_TABLET | ORAL | Status: DC

## 2014-09-04 MED ORDER — ATORVASTATIN CALCIUM 20 MG PO TABS: 20.0000 mg | ORAL_TABLET | Freq: Every day | ORAL | Status: DC

## 2014-09-04 MED ORDER — ALBUTEROL SULFATE HFA 108 (90 BASE) MCG/ACT IN AERS: INHALATION_SPRAY | RESPIRATORY_TRACT | Status: DC

## 2014-09-04 NOTE — Progress Notes (Signed)
Subjective:    Patient ID: Luis Carter, male    DOB: 12-Feb-1946, 69 y.o.   MRN: [id number]  HPI  69 year old patient who is seen today for a wellness exam.  Medical problems include a history of diabetes mellitus with chronic kidney disease.  He has essential hypertension and a history of asthma.  He has a seizure disorder.  He has a history of substance abuse and also a history of gout. Hemoglobin A1c's have been well controlled  States he discontinued tobacco use 3 months ago  Past Medical History  Diagnosis Date  . COPD 04/14/2007  . HYPERTENSION 04/14/2007  . LEG PAIN, RIGHT 05/19/2008  . LOW BACK PAIN 04/14/2007  . PANCREATITIS, HX OF 04/14/2007  . Coronary artery disease   . Gout   . GI bleed   . DIABETES MELLITUS, TYPE II 04/14/2007  . SEIZURE DISORDER 04/14/2007    "used to have them when I was young" (03/22/2013)  . Pneumonia     "twice" (03/22/2013)  . Shortness of breath     "related to asthma" (03/22/2013)  . GERD (gastroesophageal reflux disease)   . Myocardial infarction     ~ 2001/notes 09/13/2001 (03/22/2013)  . ASTHMA     since childhood/notes 10/04/2001 (03/22/2013)    History   Social History  . Marital Status: Married    Spouse Name: N/A    Number of Children: N/A  . Years of Education: N/A   Occupational History  . Not on file.   Social History Main Topics  . Smoking status: Current Every Day Smoker -- 0.25 packs/day for 39 years    Types: Cigarettes  . Smokeless tobacco: Never Used  . Alcohol Use: No     Comment: 03/22/2013 "quit drinking years ago; never had problem w/it"  . Drug Use: No  . Sexual Activity: No   Other Topics Concern  . Not on file   Social History Narrative    Past Surgical History  Procedure Laterality Date  . Esophagogastroduodenoscopy  12/21/2011    Procedure: ESOPHAGOGASTRODUODENOSCOPY (EGD);  Surgeon: Louis Meckel, MD;  Location: [facility] ENDOSCOPY;  Service: Endoscopy;  Laterality: N/A;  . Coronary angioplasty with  stent placement      Family History  Problem Relation Age of Onset  . Heart disease Sister   . Heart disease Brother   . Heart disease Brother   . Heart disease Brother   . Heart disease Brother     No Known Allergies  No current outpatient prescriptions on file prior to visit.   No current facility-administered medications on file prior to visit.    BP 130/80 mmHg  Pulse 48  Temp(Src) 97.4 F (36.3 C) (Oral)  Resp 20  Ht 5' 5.5" (1.664 m)  Wt 198 lb (89.812 kg)  BMI 32.44 kg/m2  SpO2 97%   1. Risk factors, based on past  M,S,F history.  Cardiovascular risk factors include hypertension, diabetes and dyslipidemia.  2.  Physical activities: No activity restrictions been fairly sedentary due to asthma and intermittent left leg pain  3.  Depression/mood: No history depression or mood disorder  4.  Hearing: No deficits  5.  ADL's: Independent in all aspects of daily living  6.  Fall risk: Low  7.  Home safety: No problems identified.   8.  Height weight, and visual acuity; height and weight stable no change in visual acuity.  States that he did have an eye examination in 2015  9.  Counseling: Heart  healthy diet regular exercise.  All encouraged  10. Lab orders based on risk factors: Laboratory profile including hemoglobin A1c, lipid profile, urine for microalbumin will all be reviewed  11. Referral : Not appropriate at this time  12. Care plan: Heart healthy diet and aggressive risk factor modification discussed  13. Cognitive assessment: Alert and oriented with normal affect no cognitive dysfunction  14. Screening: We'll continue annual health examinations.  Will continue with quarterly hemoglobin A1c's annual eye examination recommended  15. Provider List Update: Provider list includes, ophthalmology primary care    Review of Systems  Constitutional: Negative for fever, chills, activity change, appetite change and fatigue.  HENT: Negative for congestion,  dental problem, ear pain, hearing loss, mouth sores, rhinorrhea, sinus pressure, sneezing, tinnitus, trouble swallowing and voice change.   Eyes: Negative for photophobia, pain, redness and visual disturbance.  Respiratory: Positive for shortness of breath and wheezing. Negative for apnea, cough, choking and chest tightness.   Cardiovascular: Negative for chest pain, palpitations and leg swelling.  Gastrointestinal: Negative for nausea, vomiting, abdominal pain, diarrhea, constipation, blood in stool, abdominal distention, anal bleeding and rectal pain.  Genitourinary: Negative for dysuria, urgency, frequency, hematuria, flank pain, decreased urine volume, discharge, penile swelling, scrotal swelling, difficulty urinating, genital sores and testicular pain.  Musculoskeletal: Negative for myalgias, back pain, joint swelling, arthralgias, gait problem, neck pain and neck stiffness.       Intermittent left leg pain  Skin: Negative for color change, rash and wound.  Neurological: Negative for dizziness, tremors, seizures, syncope, facial asymmetry, speech difficulty, weakness, light-headedness, numbness and headaches.       Intermittent burning of the feet  Hematological: Negative for adenopathy. Does not bruise/bleed easily.  Psychiatric/Behavioral: Negative for suicidal ideas, hallucinations, behavioral problems, confusion, sleep disturbance, self-injury, dysphoric mood, decreased concentration and agitation. The patient is not nervous/anxious.        Objective:   Physical Exam  Constitutional: He appears well-developed and well-nourished.  HENT:  Head: Normocephalic and atraumatic.  Right Ear: External ear normal.  Left Ear: External ear normal.  Nose: Nose normal.  Mouth/Throat: Oropharynx is clear and moist.  Eyes: Conjunctivae and EOM are normal. Pupils are equal, round, and reactive to light. No scleral icterus.  Neck: Normal range of motion. Neck supple. No JVD present. No thyromegaly  present.  Cardiovascular: Regular rhythm and normal heart sounds.  Exam reveals no gallop and no friction rub.   No murmur heard. The right dorsalis pedis pulse was absent;  posterior tibial pulses plus 1; the left dorsalis pedis pulses full  Pulmonary/Chest: Effort normal. He has wheezes. He exhibits no tenderness.  Abdominal: Soft. Bowel sounds are normal. He exhibits no distension and no mass. There is no tenderness.  Genitourinary: Prostate normal and penis normal.  Musculoskeletal: Normal range of motion. He exhibits no edema or tenderness.  Lymphadenopathy:    He has no cervical adenopathy.  Neurological: He is alert. He has normal reflexes. No cranial nerve deficit. Coordination normal.  Decreased monofilament testing Absent Achilles reflexes  Skin: Skin is warm and dry. No rash noted.  Psychiatric: He has a normal mood and affect. His behavior is normal.          Assessment & Plan:   Preventive health exam Diabetes mellitus.  Will check a hemoglobin A1c and urine for microalbumin Hypertension Asthma.  Will continue maintenance medications Dyslipidemia.  Continue atorvastatin  Recheck 3 months

## 2014-09-04 NOTE — Telephone Encounter (Signed)
Spoke to pt, told him needs to contact Medicaid to see if Shingles vaccine is covered and then let me know. Pt verbalized understanding.

## 2014-09-04 NOTE — Progress Notes (Signed)
Pre visit review using our clinic review tool, if applicable. No additional management support is needed unless otherwise documented below in the visit note. 

## 2014-09-04 NOTE — Patient Instructions (Signed)
Limit your sodium (Salt) intake    It is important that you exercise regularly, at least 20 minutes 3 to 4 times per week.  If you develop chest pain or shortness of breath seek  medical attention.   Please check your hemoglobin A1c every 3 months  Health Maintenance A healthy lifestyle and preventative care can promote health and wellness.  Maintain regular health, dental, and eye exams.  Eat a healthy diet. Foods like vegetables, fruits, whole grains, low-fat dairy products, and lean protein foods contain the nutrients you need and are low in calories. Decrease your intake of foods high in solid fats, added sugars, and salt. Get information about a proper diet from your health care provider, if necessary.  Regular physical exercise is one of the most important things you can do for your health. Most adults should get at least 150 minutes of moderate-intensity exercise (any activity that increases your heart rate and causes you to sweat) each week. In addition, most adults need muscle-strengthening exercises on 2 or more days a week.   Maintain a healthy weight. The body mass index (BMI) is a screening tool to identify possible weight problems. It provides an estimate of body fat based on height and weight. Your health care provider can find your BMI and can help you achieve or maintain a healthy weight. For males 20 years and older:  A BMI below 18.5 is considered underweight.  A BMI of 18.5 to 24.9 is normal.  A BMI of 25 to 29.9 is considered overweight.  A BMI of 30 and above is considered obese.  Maintain normal blood lipids and cholesterol by exercising and minimizing your intake of saturated fat. Eat a balanced diet with plenty of fruits and vegetables. Blood tests for lipids and cholesterol should begin at age 20 and be repeated every 5 years. If your lipid or cholesterol levels are high, you are over age 50, or you are at high risk for heart disease, you may need your cholesterol  levels checked more frequently.Ongoing high lipid and cholesterol levels should be treated with medicines if diet and exercise are not working.  If you smoke, find out from your health care provider how to quit. If you do not use tobacco, do not start.  Lung cancer screening is recommended for adults aged 55-80 years who are at high risk for developing lung cancer because of a history of smoking. A yearly low-dose CT scan of the lungs is recommended for people who have at least a 30-pack-year history of smoking and are current smokers or have quit within the past 15 years. A pack year of smoking is smoking an average of 1 pack of cigarettes a day for 1 year (for example, a 30-pack-year history of smoking could mean smoking 1 pack a day for 30 years or 2 packs a day for 15 years). Yearly screening should continue until the smoker has stopped smoking for at least 15 years. Yearly screening should be stopped for people who develop a health problem that would prevent them from having lung cancer treatment.  If you choose to drink alcohol, do not have more than 2 drinks per day. One drink is considered to be 12 oz (360 mL) of beer, 5 oz (150 mL) of wine, or 1.5 oz (45 mL) of liquor.  Avoid the use of street drugs. Do not share needles with anyone. Ask for help if you need support or instructions about stopping the use of drugs.  High blood   pressure causes heart disease and increases the risk of stroke. Blood pressure should be checked at least every 1-2 years. Ongoing high blood pressure should be treated with medicines if weight loss and exercise are not effective.  If you are 45-79 years old, ask your health care provider if you should take aspirin to prevent heart disease.  Diabetes screening involves taking a blood sample to check your fasting blood sugar level. This should be done once every 3 years after age 45 if you are at a normal weight and without risk factors for diabetes. Testing should be  considered at a younger age or be carried out more frequently if you are overweight and have at least 1 risk factor for diabetes.  Colorectal cancer can be detected and often prevented. Most routine colorectal cancer screening begins at the age of 50 and continues through age 75. However, your health care provider may recommend screening at an earlier age if you have risk factors for colon cancer. On a yearly basis, your health care provider may provide home test kits to check for hidden blood in the stool. A small camera at the end of a tube may be used to directly examine the colon (sigmoidoscopy or colonoscopy) to detect the earliest forms of colorectal cancer. Talk to your health care provider about this at age 50 when routine screening begins. A direct exam of the colon should be repeated every 5-10 years through age 75, unless early forms of precancerous polyps or small growths are found.  People who are at an increased risk for hepatitis B should be screened for this virus. You are considered at high risk for hepatitis B if:  You were born in a country where hepatitis B occurs often. Talk with your health care provider about which countries are considered high risk.  Your parents were born in a high-risk country and you have not received a shot to protect against hepatitis B (hepatitis B vaccine).  You have HIV or AIDS.  You use needles to inject street drugs.  You live with, or have sex with, someone who has hepatitis B.  You are a man who has sex with other men (MSM).  You get hemodialysis treatment.  You take certain medicines for conditions like cancer, organ transplantation, and autoimmune conditions.  Hepatitis C blood testing is recommended for all people born from 1945 through 1965 and any individual with known risk factors for hepatitis C.  Healthy men should no longer receive prostate-specific antigen (PSA) blood tests as part of routine cancer screening. Talk to your health  care provider about prostate cancer screening.  Testicular cancer screening is not recommended for adolescents or adult males who have no symptoms. Screening includes self-exam, a health care provider exam, and other screening tests. Consult with your health care provider about any symptoms you have or any concerns you have about testicular cancer.  Practice safe sex. Use condoms and avoid high-risk sexual practices to reduce the spread of sexually transmitted infections (STIs).  You should be screened for STIs, including gonorrhea and chlamydia if:  You are sexually active and are younger than 24 years.  You are older than 24 years, and your health care provider tells you that you are at risk for this type of infection.  Your sexual activity has changed since you were last screened, and you are at an increased risk for chlamydia or gonorrhea. Ask your health care provider if you are at risk.  If you are at risk   of being infected with HIV, it is recommended that you take a prescription medicine daily to prevent HIV infection. This is called pre-exposure prophylaxis (PrEP). You are considered at risk if:  You are a man who has sex with other men (MSM).  You are a heterosexual man who is sexually active with multiple partners.  You take drugs by injection.  You are sexually active with a partner who has HIV.  Talk with your health care provider about whether you are at high risk of being infected with HIV. If you choose to begin PrEP, you should first be tested for HIV. You should then be tested every 3 months for as long as you are taking PrEP.  Use sunscreen. Apply sunscreen liberally and repeatedly throughout the day. You should seek shade when your shadow is shorter than you. Protect yourself by wearing long sleeves, pants, a wide-brimmed hat, and sunglasses year round whenever you are outdoors.  Tell your health care provider of new moles or changes in moles, especially if there is a  change in shape or color. Also, tell your health care provider if a mole is larger than the size of a pencil eraser.  A one-time screening for abdominal aortic aneurysm (AAA) and surgical repair of large AAAs by ultrasound is recommended for men aged 65-75 years who are current or former smokers.  Stay current with your vaccines (immunizations). Document Released: 02/07/2008 Document Revised: 08/16/2013 Document Reviewed: 01/06/2011 Exit[employer] Patient Information 2015 [employer]. This information is not intended to replace advice given to you by your health care provider. Make sure you discuss any questions you have with your health care provider.  

## 2014-09-04 NOTE — Telephone Encounter (Signed)
Lupita Leashonna please fax rx for shingle vaccine to [facility] [address]

## 2014-09-04 NOTE — Telephone Encounter (Signed)
Patient states he didn't get the the script for Shingles injection.  He would like a callback regarding the script.

## 2014-09-04 NOTE — Telephone Encounter (Signed)
Rx for Shingles vaccine sent to pharmacy.

## 2014-09-05 ENCOUNTER — Telehealth: Payer: Self-pay | Admitting: Internal Medicine

## 2014-09-05 NOTE — Telephone Encounter (Signed)
emmi mailed  °

## 2014-11-07 ENCOUNTER — Other Ambulatory Visit: Payer: Self-pay | Admitting: Internal Medicine

## 2014-12-23 DIAGNOSIS — Z961 Presence of intraocular lens: Secondary | ICD-10-CM | POA: Diagnosis not present

## 2014-12-23 DIAGNOSIS — H40023 Open angle with borderline findings, high risk, bilateral: Secondary | ICD-10-CM | POA: Diagnosis not present

## 2014-12-23 DIAGNOSIS — H524 Presbyopia: Secondary | ICD-10-CM | POA: Diagnosis not present

## 2015-03-04 ENCOUNTER — Other Ambulatory Visit: Payer: Self-pay | Admitting: Internal Medicine

## 2015-03-05 ENCOUNTER — Encounter: Payer: Self-pay | Admitting: Internal Medicine

## 2015-03-05 ENCOUNTER — Ambulatory Visit (INDEPENDENT_AMBULATORY_CARE_PROVIDER_SITE_OTHER): Payer: Medicare Other | Admitting: Internal Medicine

## 2015-03-05 VITALS — BP 120/68 | HR 66 | Temp 97.6°F | Resp 18 | Ht 65.5 in | Wt 182.0 lb

## 2015-03-05 DIAGNOSIS — J4530 Mild persistent asthma, uncomplicated: Secondary | ICD-10-CM | POA: Diagnosis not present

## 2015-03-05 DIAGNOSIS — M545 Low back pain, unspecified: Secondary | ICD-10-CM

## 2015-03-05 DIAGNOSIS — E0821 Diabetes mellitus due to underlying condition with diabetic nephropathy: Secondary | ICD-10-CM | POA: Diagnosis not present

## 2015-03-05 DIAGNOSIS — I10 Essential (primary) hypertension: Secondary | ICD-10-CM

## 2015-03-05 LAB — HEMOGLOBIN A1C: Hgb A1c MFr Bld: 6.2 % (ref 4.6–6.5)

## 2015-03-05 MED ORDER — AMLODIPINE BESYLATE 5 MG PO TABS: 5.0000 mg | ORAL_TABLET | Freq: Every day | ORAL | Status: DC

## 2015-03-05 MED ORDER — ATORVASTATIN CALCIUM 20 MG PO TABS: 20.0000 mg | ORAL_TABLET | Freq: Every day | ORAL | Status: DC

## 2015-03-05 MED ORDER — METFORMIN HCL 500 MG PO TABS: 500.0000 mg | ORAL_TABLET | Freq: Two times a day (BID) | ORAL | Status: DC

## 2015-03-05 MED ORDER — TRAMADOL HCL 50 MG PO TABS: ORAL_TABLET | ORAL | Status: DC

## 2015-03-05 MED ORDER — FLUTICASONE-SALMETEROL 250-50 MCG/DOSE IN AEPB: INHALATION_SPRAY | RESPIRATORY_TRACT | Status: DC

## 2015-03-05 MED ORDER — LISINOPRIL 20 MG PO TABS: 20.0000 mg | ORAL_TABLET | Freq: Every day | ORAL | Status: DC

## 2015-03-05 MED ORDER — OMEPRAZOLE 20 MG PO CPDR: 20.0000 mg | DELAYED_RELEASE_CAPSULE | Freq: Every day | ORAL | Status: AC

## 2015-03-05 MED ORDER — MONTELUKAST SODIUM 10 MG PO TABS: 10.0000 mg | ORAL_TABLET | Freq: Every day | ORAL | Status: DC

## 2015-03-05 MED ORDER — ALBUTEROL SULFATE HFA 108 (90 BASE) MCG/ACT IN AERS: INHALATION_SPRAY | RESPIRATORY_TRACT | Status: AC

## 2015-03-05 MED ORDER — ALLOPURINOL 100 MG PO TABS: 200.0000 mg | ORAL_TABLET | Freq: Every day | ORAL | Status: AC

## 2015-03-05 MED ORDER — COLCHICINE 0.6 MG PO TABS: ORAL_TABLET | ORAL | Status: DC

## 2015-03-05 MED ORDER — TRIAMCINOLONE ACETONIDE 0.1 % EX CREA: 1.0000 "application " | TOPICAL_CREAM | Freq: Two times a day (BID) | CUTANEOUS | Status: DC

## 2015-03-05 NOTE — Progress Notes (Signed)
Subjective:    Patient ID: Luis Carter, male    DOB: 03/29/1946, 69 y.o.   MRN: [id number]  HPI  Lab Results  Component Value Date   HGBA1C 6.6* 09/04/2014   69 year old patient who has type 2 diabetes, control with metformin.  No home blood sugar monitoring.  Last hemoglobin A1c well controlled He has treated hypertension He has COPD and ongoing tobacco use. He states that he did have an eye exam approximately 2 months ago. No new concerns or complaints, but does have occasional dyspnea  Past Medical History  Diagnosis Date  . COPD 04/14/2007  . HYPERTENSION 04/14/2007  . LEG PAIN, RIGHT 05/19/2008  . LOW BACK PAIN 04/14/2007  . PANCREATITIS, HX OF 04/14/2007  . Coronary artery disease   . Gout   . GI bleed   . DIABETES MELLITUS, TYPE II 04/14/2007  . SEIZURE DISORDER 04/14/2007    "used to have them when I was young" (03/22/2013)  . Pneumonia     "twice" (03/22/2013)  . Shortness of breath     "related to asthma" (03/22/2013)  . GERD (gastroesophageal reflux disease)   . Myocardial infarction     ~ 2001/notes 09/13/2001 (03/22/2013)  . ASTHMA     since childhood/notes 10/04/2001 (03/22/2013)    History   Social History  . Marital Status: Married    Spouse Name: N/A  . Number of Children: N/A  . Years of Education: N/A   Occupational History  . Not on file.   Social History Main Topics  . Smoking status: Current Every Day Smoker -- 0.25 packs/day for 39 years    Types: Cigarettes  . Smokeless tobacco: Never Used  . Alcohol Use: No     Comment: 03/22/2013 "quit drinking years ago; never had problem w/it"  . Drug Use: No  . Sexual Activity: No   Other Topics Concern  . Not on file   Social History Narrative    Past Surgical History  Procedure Laterality Date  . Esophagogastroduodenoscopy  12/21/2011    Procedure: ESOPHAGOGASTRODUODENOSCOPY (EGD);  Surgeon: Louis Meckelobert D Kaplan, MD;  [facility];  Service: Endoscopy;  Laterality: N/A;  . Coronary  angioplasty with stent placement      Family History  Problem Relation Age of Onset  . Heart disease Sister   . Heart disease Brother   . Heart disease Brother   . Heart disease Brother   . Heart disease Brother     No Known Allergies  Current Outpatient Prescriptions on File Prior to Visit  Medication Sig Dispense Refill  . aspirin EC 81 MG tablet Take 1 tablet (81 mg total) by mouth daily. 30 tablet 11  . cyclobenzaprine (FLEXERIL) 10 MG tablet TAKE 1 TABLET BY MOUTH AT BEDTIME 30 tablet 5  . diclofenac sodium (VOLTAREN) 1 % GEL APPLY TO AFFECTED AREA 4 TIMES A DAY 100 g 3  . naproxen (NAPROSYN) 500 MG tablet Take 500 mg by mouth 2 (two) times daily with a meal.  3   No current facility-administered medications on file prior to visit.    BP 120/68 mmHg  Pulse 66  Temp(Src) 97.6 F (36.4 C) (Oral)  Resp 18  Ht 5' 5.5" (1.664 m)  Wt 182 lb (82.555 kg)  BMI 29.82 kg/m2     Review of Systems  Constitutional: Negative for fever, chills, appetite change and fatigue.  HENT: Negative for congestion, dental problem, ear pain, hearing loss, sore throat, tinnitus, trouble swallowing and voice change.  Eyes: Negative for pain, discharge and visual disturbance.  Respiratory: Positive for shortness of breath. Negative for cough, chest tightness, wheezing and stridor.   Cardiovascular: Negative for chest pain, palpitations and leg swelling.  Gastrointestinal: Negative for nausea, vomiting, abdominal pain, diarrhea, constipation, blood in stool and abdominal distention.  Genitourinary: Negative for urgency, hematuria, flank pain, discharge, difficulty urinating and genital sores.  Musculoskeletal: Negative for myalgias, back pain, joint swelling, arthralgias, gait problem and neck stiffness.  Skin: Negative for rash.  Neurological: Negative for dizziness, syncope, speech difficulty, weakness, numbness and headaches.  Hematological: Negative for adenopathy. Does not bruise/bleed  easily.  Psychiatric/Behavioral: Negative for behavioral problems and dysphoric mood. The patient is not nervous/anxious.        Objective:   Physical Exam  Constitutional: He is oriented to person, place, and time. He appears well-developed.  HENT:  Head: Normocephalic.  Right Ear: External ear normal.  Left Ear: External ear normal.  Eyes: Conjunctivae and EOM are normal.  Neck: Normal range of motion.  Cardiovascular: Normal rate and normal heart sounds.   Pulmonary/Chest: Effort normal and breath sounds normal. He has no wheezes. He has no rales.  A few coarse rhonchi noted anteriorly in expiration  Abdominal: Bowel sounds are normal.  Musculoskeletal: Normal range of motion. He exhibits no edema or tenderness.  Neurological: He is alert and oriented to person, place, and time.  Psychiatric: He has a normal mood and affect. His behavior is normal.          Assessment & Plan:   Diabetes mellitus.  Will check a hemoglobin A1c Hypertension, well-controlled COPD.  Total smoking cessation encouraged.  All medications refilled Dyslipidemia.  Continue statin History of gout.  Stable  CPX 6 months

## 2015-03-05 NOTE — Progress Notes (Signed)
Pre visit review using our clinic review tool, if applicable. No additional management support is needed unless otherwise documented below in the visit note. 

## 2015-03-05 NOTE — Patient Instructions (Signed)
Smoking tobacco is very bad for your health. You should stop smoking immediately.  Limit your sodium (Salt) intake    It is important that you exercise regularly, at least 20 minutes 3 to 4 times per week.  If you develop chest pain or shortness of breath seek  medical attention.  Return in 6 months for follow-up

## 2015-09-05 ENCOUNTER — Other Ambulatory Visit: Payer: Self-pay | Admitting: Internal Medicine

## 2015-09-07 ENCOUNTER — Ambulatory Visit (INDEPENDENT_AMBULATORY_CARE_PROVIDER_SITE_OTHER): Payer: Medicare Other | Admitting: Internal Medicine

## 2015-09-07 ENCOUNTER — Encounter: Payer: Self-pay | Admitting: Internal Medicine

## 2015-09-07 VITALS — BP 96/60 | HR 56 | Resp 20 | Ht 65.5 in | Wt 175.0 lb

## 2015-09-07 DIAGNOSIS — E0821 Diabetes mellitus due to underlying condition with diabetic nephropathy: Secondary | ICD-10-CM | POA: Diagnosis not present

## 2015-09-07 DIAGNOSIS — F17200 Nicotine dependence, unspecified, uncomplicated: Secondary | ICD-10-CM

## 2015-09-07 DIAGNOSIS — I10 Essential (primary) hypertension: Secondary | ICD-10-CM | POA: Diagnosis not present

## 2015-09-07 DIAGNOSIS — Z0000 Encounter for general adult medical examination without abnormal findings: Secondary | ICD-10-CM

## 2015-09-07 DIAGNOSIS — Z23 Encounter for immunization: Secondary | ICD-10-CM | POA: Diagnosis not present

## 2015-09-07 LAB — LIPID PANEL
CHOL/HDL RATIO: 3
Cholesterol: 117 mg/dL (ref 0–200)
HDL: 45.8 mg/dL (ref >39.00)
LDL Cholesterol: 60 mg/dL (ref 0–99)
NonHDL: 70.98
TRIGLYCERIDES: 54 mg/dL (ref 0.0–149.0)
VLDL: 10.8 mg/dL (ref 0.0–40.0)

## 2015-09-07 LAB — COMPREHENSIVE METABOLIC PANEL
ALT: 9 U/L (ref 0–53)
AST: 17 U/L (ref 0–37)
Albumin: 3.9 g/dL (ref 3.5–5.2)
Alkaline Phosphatase: 91 U/L (ref 39–117)
BILIRUBIN TOTAL: 0.4 mg/dL (ref 0.2–1.2)
BUN: 24 mg/dL — ABNORMAL HIGH (ref 6–23)
CO2: 28 meq/L (ref 19–32)
Calcium: 9.3 mg/dL (ref 8.4–10.5)
Chloride: 106 mEq/L (ref 96–112)
Creatinine, Ser: 1.79 mg/dL — ABNORMAL HIGH (ref 0.40–1.50)
GFR: 48.27 mL/min — AB (ref >60.00)
Glucose, Bld: 91 mg/dL (ref 70–99)
POTASSIUM: 5.7 meq/L — AB (ref 3.5–5.1)
Sodium: 141 mEq/L (ref 135–145)
TOTAL PROTEIN: 6.9 g/dL (ref 6.0–8.3)

## 2015-09-07 LAB — MICROALBUMIN / CREATININE URINE RATIO
CREATININE, U: 225.1 mg/dL
Microalb Creat Ratio: 0.4 mg/g (ref 0.0–30.0)
Microalb, Ur: 0.9 mg/dL (ref 0.0–1.9)

## 2015-09-07 LAB — TSH: TSH: 2.41 u[IU]/mL (ref 0.35–4.50)

## 2015-09-07 LAB — CBC WITH DIFFERENTIAL/PLATELET
BASOS ABS: 0 10*3/uL (ref 0.0–0.1)
Basophils Relative: 0.6 % (ref 0.0–3.0)
Eosinophils Absolute: 0.9 10*3/uL — ABNORMAL HIGH (ref 0.0–0.7)
Eosinophils Relative: 11.4 % — ABNORMAL HIGH (ref 0.0–5.0)
HCT: 42.9 % (ref 39.0–52.0)
Hemoglobin: 13.9 g/dL (ref 13.0–17.0)
LYMPHS ABS: 2.2 10*3/uL (ref 0.7–4.0)
Lymphocytes Relative: 26.8 % (ref 12.0–46.0)
MCHC: 32.5 g/dL (ref 30.0–36.0)
MCV: 85.8 fl (ref 78.0–100.0)
Monocytes Absolute: 0.9 10*3/uL (ref 0.1–1.0)
Monocytes Relative: 10.9 % (ref 3.0–12.0)
NEUTROS ABS: 4.1 10*3/uL (ref 1.4–7.7)
NEUTROS PCT: 50.3 % (ref 43.0–77.0)
PLATELETS: 265 10*3/uL (ref 150.0–400.0)
RBC: 5.01 Mil/uL (ref 4.22–5.81)
RDW: 15.4 % (ref 11.5–15.5)
WBC: 8.1 10*3/uL (ref 4.0–10.5)

## 2015-09-07 LAB — HEMOGLOBIN A1C: Hgb A1c MFr Bld: 6.4 % (ref 4.6–6.5)

## 2015-09-07 NOTE — Progress Notes (Signed)
Pre visit review using our clinic review tool, if applicable. No additional management support is needed unless otherwise documented below in the visit note. 

## 2015-09-07 NOTE — Patient Instructions (Signed)
Limit your sodium (Salt) intake   Please check your hemoglobin A1c every 6 months    It is important that you exercise regularly, at least 20 minutes 3 to 4 times per week.  If you develop chest pain or shortness of breath seek  medical attention.  Smoking tobacco is very bad for your health. You should stop smoking immediately.

## 2015-09-07 NOTE — Progress Notes (Signed)
Subjective:    Patient ID: Luis Carter, male    DOB: July 20, 1946, 70 y.o.   MRN: [id number]  HPI   70 year old patient who is seen today for a wellness exam.   Medical problems include a history of diabetes mellitus with chronic kidney disease.  He has essential hypertension and a history of asthma.  He has a seizure disorder.  He has a history of substance abuse and also a history of gout. Hemoglobin A1c's have been well controlled  He has resumed smoking tobacco products.  Lab Results  Component Value Date   HGBA1C 6.2 03/05/2015    BP Readings from Last 3 Encounters:  09/07/15 96/60  03/05/15 120/68  09/04/14 130/80    Wt Readings from Last 3 Encounters:  09/07/15 175 lb (79.379 kg)  03/05/15 182 lb (82.555 kg)  09/04/14 198 lb (89.812 kg)    Past Medical History  Diagnosis Date  . COPD 04/14/2007  . HYPERTENSION 04/14/2007  . LEG PAIN, RIGHT 05/19/2008  . LOW BACK PAIN 04/14/2007  . PANCREATITIS, HX OF 04/14/2007  . Coronary artery disease   . Gout   . GI bleed   . DIABETES MELLITUS, TYPE II 04/14/2007  . SEIZURE DISORDER 04/14/2007    "used to have them when I was young" (03/22/2013)  . Pneumonia     "twice" (03/22/2013)  . Shortness of breath     "related to asthma" (03/22/2013)  . GERD (gastroesophageal reflux disease)   . Myocardial infarction (HCC)     ~ 2001/notes 09/13/2001 (03/22/2013)  . ASTHMA     since childhood/notes 10/04/2001 (03/22/2013)    Social History   Social History  . Marital Status: Married    Spouse Name: N/A  . Number of Children: N/A  . Years of Education: N/A   Occupational History  . Not on file.   Social History Main Topics  . Smoking status: Current Every Day Smoker -- 0.25 packs/day for 39 years    Types: Cigarettes  . Smokeless tobacco: Never Used  . Alcohol Use: No     Comment: 03/22/2013 "quit drinking years ago; never had problem w/it"  . Drug Use: No  . Sexual Activity: No   Other Topics Concern  . Not on file     Social History Narrative    Past Surgical History  Procedure Laterality Date  . Esophagogastroduodenoscopy  12/21/2011    Procedure: ESOPHAGOGASTRODUODENOSCOPY (EGD);  Surgeon: Louis Meckel, MD;  Location: [facility] ENDOSCOPY;  Service: Endoscopy;  Laterality: N/A;  . Coronary angioplasty with stent placement      Family History  Problem Relation Age of Onset  . Heart disease Sister   . Heart disease Brother   . Heart disease Brother   . Heart disease Brother   . Heart disease Brother     No Known Allergies  Current Outpatient Prescriptions on File Prior to Visit  Medication Sig Dispense Refill  . albuterol (PROAIR HFA) 108 (90 BASE) MCG/ACT inhaler INHALE 2 PUFFS INTO THE LUNGS EVERY 6 (SIX) HOURS AS NEEDED FOR WHEEZING. 3 Inhaler 3  . allopurinol (ZYLOPRIM) 100 MG tablet Take 2 tablets (200 mg total) by mouth daily. 60 tablet 5  . amLODipine (NORVASC) 5 MG tablet Take 1 tablet (5 mg total) by mouth daily. 90 tablet 1  . aspirin EC 81 MG tablet Take 1 tablet (81 mg total) by mouth daily. 30 tablet 11  . atorvastatin (LIPITOR) 20 MG tablet Take 1 tablet (20 mg total) by  mouth daily. 90 tablet 1  . colchicine (COLCRYS) 0.6 MG tablet TAKE 1 TABLET BY MOUTH TWICE A DAY AS NEEDED FOR GOUT FLARE-UPS 60 tablet 5  . cyclobenzaprine (FLEXERIL) 10 MG tablet TAKE 1 TABLET BY MOUTH AT BEDTIME 30 tablet 5  . diclofenac sodium (VOLTAREN) 1 % GEL APPLY TO AFFECTED AREA 4 TIMES A DAY 100 g 3  . Fluticasone-Salmeterol (ADVAIR DISKUS) 250-50 MCG/DOSE AEPB INHALE 1 PUFF INTO THE LUNGS EVERY 12 HOURS. 180 each 3  . lisinopril (PRINIVIL,ZESTRIL) 20 MG tablet Take 1 tablet (20 mg total) by mouth daily. 90 tablet 1  . metFORMIN (GLUCOPHAGE) 500 MG tablet TAKE 1 TABLET BY MOUTH TWICE A DAY 180 tablet 1  . montelukast (SINGULAIR) 10 MG tablet Take 1 tablet (10 mg total) by mouth at bedtime. 90 tablet 3  . naproxen (NAPROSYN) 500 MG tablet Take 500 mg by mouth 2 (two) times daily with a meal.  3  .  omeprazole (PRILOSEC) 20 MG capsule Take 1 capsule (20 mg total) by mouth daily. 90 capsule 3  . PAZEO 0.7 % SOLN INSTILL 1 DROP IN BOTH EYES DAILY IN THE MORNING.  4  . traMADol (ULTRAM) 50 MG tablet TAKE 1 TABLET BY MOUTH EVERY 8 HOURS AS NEEDED FOR PAIN 90 tablet 5  . triamcinolone cream (KENALOG) 0.1 % Apply 1 application topically 2 (two) times daily. 30 g 0   No current facility-administered medications on file prior to visit.    BP 96/60 mmHg  Pulse 56  Resp 20  Ht 5' 5.5" (1.664 m)  Wt 175 lb (79.379 kg)  BMI 28.67 kg/m2  SpO2 96%   1. Risk factors, based on past  M,S,F history.  Cardiovascular risk factors include hypertension, diabetes and dyslipidemia.  2.  Physical activities: No activity restrictions been fairly sedentary due to asthma and intermittent left leg pain  3.  Depression/mood: No history depression or mood disorder  4.  Hearing: No deficits  5.  ADL's: Independent in all aspects of daily living  6.  Fall risk: Low  7.  Home safety: No problems identified.   8.  Height weight, and visual acuity; height and weight stable no change in visual acuity.  States that he did have an eye examination in 2015  9.  Counseling: Heart healthy diet regular exercise.  All encouraged  10. Lab orders based on risk factors: Laboratory profile including hemoglobin A1c, lipid profile, urine for microalbumin will all be reviewed  11. Referral : Not appropriate at this time  12. Care plan: Heart healthy diet and aggressive risk factor modification discussed  13. Cognitive assessment: Alert and oriented with normal affect no cognitive dysfunction  14. Screening: We'll continue annual health examinations.  Will continue with quarterly hemoglobin A1c's annual eye examination recommended  15. Provider List Update: Provider list includes, ophthalmology primary care    Review of Systems  Constitutional: Negative for fever, chills, activity change, appetite change and  fatigue.  HENT: Negative for congestion, dental problem, ear pain, hearing loss, mouth sores, rhinorrhea, sinus pressure, sneezing, tinnitus, trouble swallowing and voice change.   Eyes: Negative for photophobia, pain, redness and visual disturbance.  Respiratory: Positive for shortness of breath and wheezing. Negative for apnea, cough, choking and chest tightness.   Cardiovascular: Negative for chest pain, palpitations and leg swelling.  Gastrointestinal: Negative for nausea, vomiting, abdominal pain, diarrhea, constipation, blood in stool, abdominal distention, anal bleeding and rectal pain.  Genitourinary: Negative for dysuria, urgency, frequency, hematuria, flank  pain, decreased urine volume, discharge, penile swelling, scrotal swelling, difficulty urinating, genital sores and testicular pain.  Musculoskeletal: Negative for myalgias, back pain, joint swelling, arthralgias, gait problem, neck pain and neck stiffness.       Intermittent left leg pain  Skin: Negative for color change, rash and wound.  Neurological: Negative for dizziness, tremors, seizures, syncope, facial asymmetry, speech difficulty, weakness, light-headedness, numbness and headaches.       Intermittent burning of the feet  Hematological: Negative for adenopathy. Does not bruise/bleed easily.  Psychiatric/Behavioral: Negative for suicidal ideas, hallucinations, behavioral problems, confusion, sleep disturbance, self-injury, dysphoric mood, decreased concentration and agitation. The patient is not nervous/anxious.        Objective:   Physical Exam  Constitutional: He appears well-developed and well-nourished.  HENT:  Head: Normocephalic and atraumatic.  Right Ear: External ear normal.  Left Ear: External ear normal.  Nose: Nose normal.  Mouth/Throat: Oropharynx is clear and moist.  Missing dentition  Eyes: Conjunctivae and EOM are normal. Pupils are equal, round, and reactive to light. No scleral icterus.  Neck: Normal  range of motion. Neck supple. No JVD present. No thyromegaly present.  Cardiovascular: Regular rhythm and normal heart sounds.  Exam reveals no gallop and no friction rub.   No murmur heard. Pedal pulses absent except for an intact left dorsalis pedis pulse  Pulmonary/Chest: Effort normal. He has wheezes. He exhibits no tenderness.  Scattered coarse rhonchi faint expiratory wheezing.  Prolonged expiratory phase.  No distress  Abdominal: Soft. Bowel sounds are normal. He exhibits no distension and no mass. There is no tenderness.  Genitourinary: Prostate normal and penis normal.  Musculoskeletal: Normal range of motion. He exhibits no edema or tenderness.  Lymphadenopathy:    He has no cervical adenopathy.  Neurological: He is alert. He has normal reflexes. No cranial nerve deficit. Coordination normal.  Decreased monofilament testing Absent Achilles reflexes  Skin: Skin is warm and dry. No rash noted.  Psychiatric: He has a normal mood and affect. His behavior is normal.          Assessment & Plan:   Preventive health exam Diabetes mellitus.  Will check a hemoglobin A1c and urine for microalbumin Hypertension Asthma.  Will continue maintenance medications.  Total smoking cessation encouraged   Dyslipidemia.  Continue atorvastatin.  Review lipid profile  Recheck 6 months

## 2015-09-08 LAB — HEPATITIS C ANTIBODY: HCV Ab: NEGATIVE

## 2015-10-20 ENCOUNTER — Other Ambulatory Visit: Payer: Self-pay | Admitting: Internal Medicine

## 2015-11-05 ENCOUNTER — Other Ambulatory Visit: Payer: Self-pay | Admitting: Internal Medicine

## 2015-11-16 ENCOUNTER — Other Ambulatory Visit: Payer: Self-pay | Admitting: Internal Medicine

## 2015-12-12 ENCOUNTER — Observation Stay (HOSPITAL_COMMUNITY)
Admission: EM | Admit: 2015-12-12 | Discharge: 2015-12-14 | Disposition: A | Discharge status: Home / Self Care | Payer: Medicare Other | Attending: Internal Medicine | Admitting: Internal Medicine

## 2015-12-12 ENCOUNTER — Encounter (HOSPITAL_COMMUNITY): Payer: Self-pay | Admitting: Emergency Medicine

## 2015-12-12 ENCOUNTER — Emergency Department (HOSPITAL_COMMUNITY): Payer: Medicare Other

## 2015-12-12 DIAGNOSIS — N1832 Chronic kidney disease, stage 3b: Secondary | ICD-10-CM | POA: Diagnosis present

## 2015-12-12 DIAGNOSIS — N183 Chronic kidney disease, stage 3 unspecified: Secondary | ICD-10-CM | POA: Diagnosis present

## 2015-12-12 DIAGNOSIS — E0821 Diabetes mellitus due to underlying condition with diabetic nephropathy: Secondary | ICD-10-CM | POA: Diagnosis not present

## 2015-12-12 DIAGNOSIS — Z7982 Long term (current) use of aspirin: Secondary | ICD-10-CM | POA: Insufficient documentation

## 2015-12-12 DIAGNOSIS — S299XXA Unspecified injury of thorax, initial encounter: Secondary | ICD-10-CM | POA: Diagnosis not present

## 2015-12-12 DIAGNOSIS — I252 Old myocardial infarction: Secondary | ICD-10-CM | POA: Diagnosis not present

## 2015-12-12 DIAGNOSIS — J449 Chronic obstructive pulmonary disease, unspecified: Secondary | ICD-10-CM | POA: Insufficient documentation

## 2015-12-12 DIAGNOSIS — E1122 Type 2 diabetes mellitus with diabetic chronic kidney disease: Secondary | ICD-10-CM | POA: Insufficient documentation

## 2015-12-12 DIAGNOSIS — F1490 Cocaine use, unspecified, uncomplicated: Secondary | ICD-10-CM | POA: Diagnosis present

## 2015-12-12 DIAGNOSIS — Y92002 Bathroom of unspecified non-institutional (private) residence single-family (private) house as the place of occurrence of the external cause: Secondary | ICD-10-CM | POA: Insufficient documentation

## 2015-12-12 DIAGNOSIS — R55 Syncope and collapse: Secondary | ICD-10-CM | POA: Diagnosis not present

## 2015-12-12 DIAGNOSIS — Z79899 Other long term (current) drug therapy: Secondary | ICD-10-CM | POA: Diagnosis not present

## 2015-12-12 DIAGNOSIS — F1410 Cocaine abuse, uncomplicated: Secondary | ICD-10-CM | POA: Diagnosis not present

## 2015-12-12 DIAGNOSIS — I129 Hypertensive chronic kidney disease with stage 1 through stage 4 chronic kidney disease, or unspecified chronic kidney disease: Secondary | ICD-10-CM | POA: Insufficient documentation

## 2015-12-12 DIAGNOSIS — S199XXA Unspecified injury of neck, initial encounter: Secondary | ICD-10-CM | POA: Diagnosis not present

## 2015-12-12 DIAGNOSIS — E785 Hyperlipidemia, unspecified: Secondary | ICD-10-CM | POA: Insufficient documentation

## 2015-12-12 DIAGNOSIS — I2510 Atherosclerotic heart disease of native coronary artery without angina pectoris: Secondary | ICD-10-CM | POA: Insufficient documentation

## 2015-12-12 DIAGNOSIS — Z7984 Long term (current) use of oral hypoglycemic drugs: Secondary | ICD-10-CM | POA: Diagnosis not present

## 2015-12-12 DIAGNOSIS — M545 Low back pain: Secondary | ICD-10-CM | POA: Insufficient documentation

## 2015-12-12 DIAGNOSIS — R931 Abnormal findings on diagnostic imaging of heart and coronary circulation: Secondary | ICD-10-CM | POA: Diagnosis not present

## 2015-12-12 DIAGNOSIS — W1812XA Fall from or off toilet with subsequent striking against object, initial encounter: Secondary | ICD-10-CM | POA: Diagnosis not present

## 2015-12-12 DIAGNOSIS — I10 Essential (primary) hypertension: Secondary | ICD-10-CM | POA: Diagnosis present

## 2015-12-12 DIAGNOSIS — S3992XA Unspecified injury of lower back, initial encounter: Secondary | ICD-10-CM | POA: Diagnosis not present

## 2015-12-12 DIAGNOSIS — R42 Dizziness and giddiness: Secondary | ICD-10-CM | POA: Diagnosis not present

## 2015-12-12 DIAGNOSIS — F17200 Nicotine dependence, unspecified, uncomplicated: Secondary | ICD-10-CM | POA: Diagnosis present

## 2015-12-12 DIAGNOSIS — K219 Gastro-esophageal reflux disease without esophagitis: Secondary | ICD-10-CM | POA: Diagnosis not present

## 2015-12-12 DIAGNOSIS — R51 Headache: Secondary | ICD-10-CM | POA: Insufficient documentation

## 2015-12-12 DIAGNOSIS — M109 Gout, unspecified: Secondary | ICD-10-CM | POA: Diagnosis not present

## 2015-12-12 DIAGNOSIS — Y93E8 Activity, other personal hygiene: Secondary | ICD-10-CM | POA: Insufficient documentation

## 2015-12-12 DIAGNOSIS — Z7951 Long term (current) use of inhaled steroids: Secondary | ICD-10-CM | POA: Insufficient documentation

## 2015-12-12 DIAGNOSIS — M546 Pain in thoracic spine: Secondary | ICD-10-CM | POA: Diagnosis not present

## 2015-12-12 DIAGNOSIS — J45909 Unspecified asthma, uncomplicated: Secondary | ICD-10-CM | POA: Diagnosis present

## 2015-12-12 DIAGNOSIS — Z87891 Personal history of nicotine dependence: Secondary | ICD-10-CM | POA: Insufficient documentation

## 2015-12-12 DIAGNOSIS — E1129 Type 2 diabetes mellitus with other diabetic kidney complication: Secondary | ICD-10-CM | POA: Diagnosis present

## 2015-12-12 DIAGNOSIS — R569 Unspecified convulsions: Secondary | ICD-10-CM

## 2015-12-12 HISTORY — DX: Unspecified osteoarthritis, unspecified site: M19.90

## 2015-12-12 HISTORY — DX: Pure hypercholesterolemia, unspecified: E78.00

## 2015-12-12 LAB — CBC
HEMATOCRIT: 42 % (ref 39.0–52.0)
HEMATOCRIT: 36.9 % — AB (ref 39.0–52.0)
HEMOGLOBIN: 13.5 g/dL (ref 13.0–17.0)
HEMOGLOBIN: 11.9 g/dL — AB (ref 13.0–17.0)
MCH: 26.9 pg (ref 26.0–34.0)
MCH: 27.2 pg (ref 26.0–34.0)
MCHC: 32.1 g/dL (ref 30.0–36.0)
MCHC: 32.2 g/dL (ref 30.0–36.0)
MCV: 83.8 fL (ref 78.0–100.0)
MCV: 84.2 fL (ref 78.0–100.0)
Platelets: 201 10*3/uL (ref 150–400)
Platelets: 183 10*3/uL (ref 150–400)
RBC: 5.01 MIL/uL (ref 4.22–5.81)
RBC: 4.38 MIL/uL (ref 4.22–5.81)
RDW: 15.2 % (ref 11.5–15.5)
RDW: 15.2 % (ref 11.5–15.5)
WBC: 4 10*3/uL (ref 4.0–10.5)
WBC: 4.5 10*3/uL (ref 4.0–10.5)

## 2015-12-12 LAB — DIFFERENTIAL
BASOS ABS: 0 10*3/uL (ref 0.0–0.1)
BASOS PCT: 1 %
EOS ABS: 0.1 10*3/uL (ref 0.0–0.7)
Eosinophils Relative: 3 %
LYMPHS ABS: 1.5 10*3/uL (ref 0.7–4.0)
Lymphocytes Relative: 38 %
MONOS PCT: 17 %
Monocytes Absolute: 0.7 10*3/uL (ref 0.1–1.0)
NEUTROS PCT: 41 %
Neutro Abs: 1.7 10*3/uL (ref 1.7–7.7)

## 2015-12-12 LAB — URINALYSIS, ROUTINE W REFLEX MICROSCOPIC
Bilirubin Urine: NEGATIVE
GLUCOSE, UA: NEGATIVE mg/dL
Hgb urine dipstick: NEGATIVE
Ketones, ur: NEGATIVE mg/dL
LEUKOCYTES UA: NEGATIVE
NITRITE: NEGATIVE
PH: 6 (ref 5.0–8.0)
Protein, ur: NEGATIVE mg/dL
SPECIFIC GRAVITY, URINE: 1.024 (ref 1.005–1.030)

## 2015-12-12 LAB — COMPREHENSIVE METABOLIC PANEL
ALBUMIN: 3.5 g/dL (ref 3.5–5.0)
ALT: 16 U/L — AB (ref 17–63)
AST: 24 U/L (ref 15–41)
Alkaline Phosphatase: 78 U/L (ref 38–126)
Anion gap: 10 (ref 5–15)
BUN: 14 mg/dL (ref 6–20)
CHLORIDE: 107 mmol/L (ref 101–111)
CO2: 24 mmol/L (ref 22–32)
CREATININE: 1.45 mg/dL — AB (ref 0.61–1.24)
Calcium: 8.5 mg/dL — ABNORMAL LOW (ref 8.9–10.3)
GFR calc Af Amer: 54 mL/min — ABNORMAL LOW (ref >60)
GFR, EST NON AFRICAN AMERICAN: 47 mL/min — AB (ref >60)
GLUCOSE: 100 mg/dL — AB (ref 65–99)
POTASSIUM: 3.6 mmol/L (ref 3.5–5.1)
Sodium: 141 mmol/L (ref 135–145)
Total Bilirubin: 0.4 mg/dL (ref 0.3–1.2)
Total Protein: 6.8 g/dL (ref 6.5–8.1)

## 2015-12-12 LAB — RAPID URINE DRUG SCREEN, HOSP PERFORMED
Amphetamines: NOT DETECTED
Barbiturates: NOT DETECTED
Benzodiazepines: NOT DETECTED
Cocaine: POSITIVE — AB
Opiates: NOT DETECTED
Tetrahydrocannabinol: NOT DETECTED

## 2015-12-12 LAB — APTT: APTT: 31 s (ref 24–37)

## 2015-12-12 LAB — CREATININE, SERUM
Creatinine, Ser: 1.46 mg/dL — ABNORMAL HIGH (ref 0.61–1.24)
GFR, EST AFRICAN AMERICAN: 54 mL/min — AB (ref >60)
GFR, EST NON AFRICAN AMERICAN: 46 mL/min — AB (ref >60)

## 2015-12-12 LAB — I-STAT TROPONIN, ED: Troponin i, poc: 0.01 ng/mL (ref 0.00–0.08)

## 2015-12-12 LAB — TROPONIN I: Troponin I: <0.03 ng/mL

## 2015-12-12 LAB — PROTIME-INR
INR: 0.98 (ref 0.00–1.49)
Prothrombin Time: 13.2 seconds (ref 11.6–15.2)

## 2015-12-12 LAB — GLUCOSE, CAPILLARY: GLUCOSE-CAPILLARY: 96 mg/dL (ref 65–99)

## 2015-12-12 MED ORDER — SODIUM CHLORIDE 0.9 % IV SOLN
INTRAVENOUS | Status: DC
  Administered 2015-12-12 – 2015-12-13 (×2): via INTRAVENOUS

## 2015-12-12 MED ORDER — ALBUTEROL SULFATE (2.5 MG/3ML) 0.083% IN NEBU
2.5000 mg | INHALATION_SOLUTION | Freq: Four times a day (QID) | RESPIRATORY_TRACT | Status: DC
  Administered 2015-12-12 – 2015-12-14 (×7): 2.5 mg via RESPIRATORY_TRACT
  Filled 2015-12-12 (×7): qty 3

## 2015-12-12 MED ORDER — MOMETASONE FURO-FORMOTEROL FUM 200-5 MCG/ACT IN AERO
2.0000 | INHALATION_SPRAY | Freq: Two times a day (BID) | RESPIRATORY_TRACT | Status: DC
  Administered 2015-12-13 – 2015-12-14 (×3): 2 via RESPIRATORY_TRACT
  Filled 2015-12-12 (×2): qty 8.8

## 2015-12-12 MED ORDER — ASPIRIN EC 81 MG PO TBEC
81.0000 mg | DELAYED_RELEASE_TABLET | Freq: Every day | ORAL | Status: DC
  Administered 2015-12-12 – 2015-12-14 (×3): 81 mg via ORAL
  Filled 2015-12-12 (×3): qty 1

## 2015-12-12 MED ORDER — INSULIN ASPART 100 UNIT/ML ~~LOC~~ SOLN
0.0000 [IU] | Freq: Three times a day (TID) | SUBCUTANEOUS | Status: DC
  Administered 2015-12-13: 2 [IU] via SUBCUTANEOUS

## 2015-12-12 MED ORDER — LISINOPRIL 20 MG PO TABS
20.0000 mg | ORAL_TABLET | Freq: Every day | ORAL | Status: DC
  Administered 2015-12-12 – 2015-12-14 (×3): 20 mg via ORAL
  Filled 2015-12-12 (×3): qty 1

## 2015-12-12 MED ORDER — PANTOPRAZOLE SODIUM 40 MG PO TBEC
40.0000 mg | DELAYED_RELEASE_TABLET | Freq: Every day | ORAL | Status: DC
  Administered 2015-12-12 – 2015-12-14 (×3): 40 mg via ORAL
  Filled 2015-12-12 (×3): qty 1

## 2015-12-12 MED ORDER — SODIUM CHLORIDE 0.9 % IV SOLN: INTRAVENOUS | Status: DC

## 2015-12-12 MED ORDER — SODIUM CHLORIDE 0.9% FLUSH
3.0000 mL | Freq: Two times a day (BID) | INTRAVENOUS | Status: DC
  Administered 2015-12-12 – 2015-12-14 (×3): 3 mL via INTRAVENOUS

## 2015-12-12 MED ORDER — ATORVASTATIN CALCIUM 20 MG PO TABS
20.0000 mg | ORAL_TABLET | Freq: Every day | ORAL | Status: DC
  Administered 2015-12-12 – 2015-12-13 (×2): 20 mg via ORAL
  Filled 2015-12-12 (×2): qty 1

## 2015-12-12 MED ORDER — HEPARIN SODIUM (PORCINE) 5000 UNIT/ML IJ SOLN
5000.0000 [IU] | Freq: Three times a day (TID) | INTRAMUSCULAR | Status: DC
  Administered 2015-12-12 – 2015-12-13 (×2): 5000 [IU] via SUBCUTANEOUS
  Filled 2015-12-12 (×2): qty 1

## 2015-12-12 MED ORDER — TRAMADOL HCL 50 MG PO TABS: 50.0000 mg | ORAL_TABLET | Freq: Four times a day (QID) | ORAL | Status: DC | PRN

## 2015-12-12 MED ORDER — AMLODIPINE BESYLATE 5 MG PO TABS
5.0000 mg | ORAL_TABLET | Freq: Every day | ORAL | Status: DC
  Administered 2015-12-12 – 2015-12-14 (×3): 5 mg via ORAL
  Filled 2015-12-12 (×3): qty 1

## 2015-12-12 MED ORDER — ALLOPURINOL 100 MG PO TABS
200.0000 mg | ORAL_TABLET | Freq: Every day | ORAL | Status: DC
  Administered 2015-12-12 – 2015-12-14 (×3): 200 mg via ORAL
  Filled 2015-12-12 (×3): qty 2

## 2015-12-12 MED ORDER — MONTELUKAST SODIUM 10 MG PO TABS
10.0000 mg | ORAL_TABLET | Freq: Every day | ORAL | Status: DC
  Administered 2015-12-12 – 2015-12-13 (×2): 10 mg via ORAL
  Filled 2015-12-12 (×2): qty 1
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
## 2015-12-12 NOTE — ED Notes (Signed)
Attempted report 

## 2015-12-12 NOTE — ED Notes (Signed)
Pt here with caregiver c/o fall x 2 today; pt sts hurting all over

## 2015-12-12 NOTE — ED Notes (Signed)
Ordered heart healthy/carb modified diet tray for patient.

## 2015-12-13 ENCOUNTER — Observation Stay (HOSPITAL_COMMUNITY): Payer: Medicare Other

## 2015-12-13 DIAGNOSIS — N1832 Chronic kidney disease, stage 3b: Secondary | ICD-10-CM | POA: Diagnosis present

## 2015-12-13 DIAGNOSIS — E0821 Diabetes mellitus due to underlying condition with diabetic nephropathy: Secondary | ICD-10-CM | POA: Diagnosis not present

## 2015-12-13 DIAGNOSIS — R931 Abnormal findings on diagnostic imaging of heart and coronary circulation: Secondary | ICD-10-CM | POA: Diagnosis present

## 2015-12-13 DIAGNOSIS — R55 Syncope and collapse: Secondary | ICD-10-CM | POA: Diagnosis not present

## 2015-12-13 DIAGNOSIS — N183 Chronic kidney disease, stage 3 unspecified: Secondary | ICD-10-CM | POA: Diagnosis present

## 2015-12-13 DIAGNOSIS — F17200 Nicotine dependence, unspecified, uncomplicated: Secondary | ICD-10-CM | POA: Diagnosis not present

## 2015-12-13 DIAGNOSIS — J449 Chronic obstructive pulmonary disease, unspecified: Secondary | ICD-10-CM

## 2015-12-13 DIAGNOSIS — J4530 Mild persistent asthma, uncomplicated: Secondary | ICD-10-CM | POA: Diagnosis not present

## 2015-12-13 LAB — BASIC METABOLIC PANEL
ANION GAP: 10 (ref 5–15)
BUN: 16 mg/dL (ref 6–20)
CALCIUM: 7.9 mg/dL — AB (ref 8.9–10.3)
CO2: 25 mmol/L (ref 22–32)
Chloride: 104 mmol/L (ref 101–111)
Creatinine, Ser: 1.39 mg/dL — ABNORMAL HIGH (ref 0.61–1.24)
GFR calc Af Amer: 57 mL/min — ABNORMAL LOW (ref >60)
GFR calc non Af Amer: 49 mL/min — ABNORMAL LOW (ref >60)
GLUCOSE: 102 mg/dL — AB (ref 65–99)
Potassium: 3.8 mmol/L (ref 3.5–5.1)
SODIUM: 139 mmol/L (ref 135–145)

## 2015-12-13 LAB — CBC
HCT: 36.7 % — ABNORMAL LOW (ref 39.0–52.0)
Hemoglobin: 11.7 g/dL — ABNORMAL LOW (ref 13.0–17.0)
MCH: 26.8 pg (ref 26.0–34.0)
MCHC: 31.9 g/dL (ref 30.0–36.0)
MCV: 84.2 fL (ref 78.0–100.0)
Platelets: 197 10*3/uL (ref 150–400)
RBC: 4.36 MIL/uL (ref 4.22–5.81)
RDW: 15.1 % (ref 11.5–15.5)
WBC: 4.6 10*3/uL (ref 4.0–10.5)

## 2015-12-13 LAB — ECHOCARDIOGRAM COMPLETE
HEIGHTINCHES: 69 in
WEIGHTICAEL: 2798.4 [oz_av]

## 2015-12-13 LAB — GLUCOSE, CAPILLARY
GLUCOSE-CAPILLARY: 112 mg/dL — AB (ref 65–99)
Glucose-Capillary: 102 mg/dL — ABNORMAL HIGH (ref 65–99)
Glucose-Capillary: 148 mg/dL — ABNORMAL HIGH (ref 65–99)
Glucose-Capillary: 108 mg/dL — ABNORMAL HIGH (ref 65–99)
Glucose-Capillary: 138 mg/dL — ABNORMAL HIGH (ref 65–99)

## 2015-12-13 LAB — URINE CULTURE

## 2015-12-13 LAB — TROPONIN I: Troponin I: <0.03 ng/mL (ref <0.031)

## 2015-12-13 MED ORDER — ENOXAPARIN SODIUM 40 MG/0.4ML ~~LOC~~ SOLN
40.0000 mg | SUBCUTANEOUS | Status: DC
  Administered 2015-12-13 – 2015-12-14 (×2): 40 mg via SUBCUTANEOUS
  Filled 2015-12-13 (×2): qty 0.4

## 2015-12-13 MED ORDER — SALINE SPRAY 0.65 % NA SOLN
1.0000 | NASAL | Status: DC | PRN
  Administered 2015-12-13: 1 via NASAL
  Filled 2015-12-13 (×2): qty 44

## 2015-12-13 NOTE — Progress Notes (Addendum)
Patient c/o sinus ache, "being stuffed up," and a tingling chest pain, while eating a hamburger. Will obtain EKG and VS. MD paged. Will continue to monitor.

## 2015-12-13 NOTE — Care Management Obs Status (Signed)
MEDICARE OBSERVATION STATUS NOTIFICATION   Patient Details  Name: Renford DillsLinwood E Majeed MRN: [id number] Date of Birth: 12/28/1945   Medicare Observation Status Notification Given:  Yes    Lawerance Sabalebbie Zaylynn Rickett, RN 12/13/2015, 11:50 AM

## 2015-12-13 NOTE — Care Management Note (Addendum)
Case Management Note  Patient Details  Name: Luis Carter MRN: [id number] Date of Birth: 04/11/1946  Subjective/Objective:                 Patient in observation after fall and using cocaine. CSW consult placed by CM for substance abuse counseling. Spoke with patient at the bedside, he states that he lives at home with his wife, he is driven to [facility] apt by his daughter in law, and he does not have difficulties obtaining his medications. Patient has PCP. He does not use any DME, but does have a cane and wheelchair available at his home.    Action/Plan:  No CM needs identified, anticipate DC later today. Resources provided for outpatient PT  Expected Discharge Date:                  Expected Discharge Plan:  Home/Self Care  In-House Referral:  Clinical Social Work  Discharge planning Services  CM Consult  Post Acute Care Choice:  NA Choice offered to:     DME Arranged:    DME Agency:     HH Arranged:    HH Agency:     Status of Service:  Completed, signed off  Medicare Important Message Given:    Date Medicare IM Given:    Medicare IM give by:    Date Additional Medicare IM Given:    Additional Medicare Important Message give by:     If discussed at Long Length of Stay Meetings, dates discussed:    Additional Comments:  Lawerance SabalDebbie Leanthony Rhett, RN 12/13/2015, 11:50 AM

## 2015-12-13 NOTE — Progress Notes (Signed)
   12/13/15 1402  Clinical Encounter Type  Visited With Patient  Visit Type Initial  Referral From Nurse   Chaplain responded to a request for prayer from the patient. Chaplain supported the patient through empathic listening, facilitating life review and reviewing medical history. Chaplain services available as needed.   Alda PonderAdam M Brysten Reister, Chaplain 12/13/2015 2:03 PM
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
## 2015-12-13 NOTE — Progress Notes (Signed)
Patient refused EKG and told NT that he said nothing about chest pain. Will continue to monitor.

## 2015-12-13 NOTE — Progress Notes (Signed)
  Echocardiogram 2D Echocardiogram has been performed.  Luis SavoyCasey N Constantina Carter 12/13/2015, 11:24 AM

## 2015-12-13 NOTE — Evaluation (Signed)
Physical Therapy Evaluation Patient Details Name: Luis Carter MRN: [id number] DOB: 02/09/1946 Today's Date: 12/13/2015   History of Present Illness  pt admitted for feelings of presyncope and 2 falls, with PMHx of of hypertension, diabetes mellitus, COPD, substance/tobacco abuse.  Clinical Impression  Pt able to move without assistance or cues. Pt unable to change gait speed or maintain head turns without feeling unsteady throughout ambulation. Pt has decreased balance, and would benefit from acute therapy in order to maximize safety and independence at home. Discussed follow up and equipment recommendations with pt.    Follow Up Recommendations Outpatient PT    Equipment Recommendations  None recommended by PT    Recommendations for Other Services       Precautions / Restrictions        Mobility  Bed Mobility               General bed mobility comments: pt in chair on arrival  Transfers Overall transfer level: Modified independent                  Ambulation/Gait Ambulation/Gait assistance: Supervision Ambulation Distance (Feet): 400 Feet Assistive device: None Gait Pattern/deviations: Step-through pattern;Antalgic Gait velocity: 2.72 ft/sec Gait velocity interpretation: >2.62 ft/sec, indicative of independent community ambulator General Gait Details: supervision for unsteadiness and inability to change gait speed or perform head turns.   Stairs            Wheelchair Mobility    Modified Rankin (Stroke Patients Only)       Balance Overall balance assessment: History of Falls;Needs assistance Sitting-balance support: No upper extremity supported Sitting balance-Leahy Scale: Good     Standing balance support: No upper extremity supported Standing balance-Leahy Scale: Good               High level balance activites: Head turns High Level Balance Comments: pt unable to maintain horizontal head turns or looking up. pt also couldn't  perform gait speed changes.             Pertinent Vitals/Pain Pain Assessment: No/denies pain    Home Living Family/patient expects to be discharged to:: Private residence Living Arrangements: Spouse/significant other;Children Available Help at Discharge: Family;Available 24 hours/day Type of Home: House Home Access: Ramped entrance     Home Layout: One level Home Equipment: Cane - single point;Wheelchair - manual (pt said that wife uses the DME, but that he'd be able to use them if he needs them)      Prior Function Level of Independence: Independent               Hand Dominance        Extremity/Trunk Assessment   Upper Extremity Assessment: Overall WFL for tasks assessed           Lower Extremity Assessment: Overall WFL for tasks assessed (pt 4/5 with right dorsiflexion, but all other LE strength testing 5/5)      Cervical / Trunk Assessment: Normal  Communication   Communication: No difficulties  Cognition Arousal/Alertness: Awake/alert Behavior During Therapy: WFL for tasks assessed/performed Overall Cognitive Status: Within Functional Limits for tasks assessed                      General Comments      Exercises        Assessment/Plan    PT Assessment Patient needs continued PT services  PT Diagnosis Difficulty walking   PT Problem List Decreased balance;Decreased safety awareness;Decreased coordination  PT Treatment Interventions Balance training;Patient/family education;Therapeutic activities;Therapeutic exercise   PT Goals (Current goals can be found in the Care Plan section) Acute Rehab PT Goals Patient Stated Goal: "find something to do" PT Goal Formulation: With patient Time For Goal Achievement: 12/20/15 Potential to Achieve Goals: Good Additional Goals Additional Goal #1: Pt will complete Berg balance assessment with score >48 to decrease fall risk    Frequency Min 3X/week   Barriers to discharge         Co-evaluation               End of Session Equipment Utilized During Treatment: Gait belt Activity Tolerance: Patient tolerated treatment well Patient left: in chair;with bed alarm set;with call bell/phone within reach      Functional Assessment Tool Used: clinical judgement Functional Limitation: Mobility: Walking and moving around Mobility: Walking and Moving Around Current Status [phone]): At least 1 percent but less than 20 percent impaired, limited or restricted Mobility: Walking and Moving Around Goal Status [phone]): At least 1 percent but less than 20 percent impaired, limited or restricted    Time: 1229-1243 PT Time Calculation (min) (ACUTE ONLY): 14 min   Charges:   PT Evaluation $PT Eval Low Complexity: 1 Procedure     PT G Codes:   PT G-Codes **NOT FOR INPATIENT CLASS** Functional Assessment Tool Used: clinical judgement Functional Limitation: Mobility: Walking and moving around Mobility: Walking and Moving Around Current Status ([id number]): At least 1 percent but less than 20 percent impaired, limited or restricted Mobility: Walking and Moving Around Goal Status [phone]): At least 1 percent but less than 20 percent impaired, limited or restricted    Cherene Julian 12/13/2015, 1:06 PM   Cherene Julian, SPT [phone]
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
## 2015-12-14 ENCOUNTER — Other Ambulatory Visit: Payer: Self-pay | Admitting: Cardiology

## 2015-12-14 ENCOUNTER — Other Ambulatory Visit: Payer: Self-pay | Admitting: Internal Medicine

## 2015-12-14 DIAGNOSIS — R55 Syncope and collapse: Secondary | ICD-10-CM | POA: Diagnosis not present

## 2015-12-14 DIAGNOSIS — R931 Abnormal findings on diagnostic imaging of heart and coronary circulation: Secondary | ICD-10-CM | POA: Diagnosis not present

## 2015-12-14 DIAGNOSIS — F1410 Cocaine abuse, uncomplicated: Secondary | ICD-10-CM

## 2015-12-14 DIAGNOSIS — N183 Chronic kidney disease, stage 3 (moderate): Secondary | ICD-10-CM | POA: Diagnosis not present

## 2015-12-14 DIAGNOSIS — J4530 Mild persistent asthma, uncomplicated: Secondary | ICD-10-CM

## 2015-12-14 DIAGNOSIS — I10 Essential (primary) hypertension: Secondary | ICD-10-CM

## 2015-12-14 DIAGNOSIS — E0821 Diabetes mellitus due to underlying condition with diabetic nephropathy: Secondary | ICD-10-CM | POA: Diagnosis not present

## 2015-12-14 DIAGNOSIS — R569 Unspecified convulsions: Secondary | ICD-10-CM

## 2015-12-14 LAB — GLUCOSE, CAPILLARY
Glucose-Capillary: 92 mg/dL (ref 65–99)
Glucose-Capillary: 129 mg/dL — ABNORMAL HIGH (ref 65–99)

## 2015-12-14 MED ORDER — NALOXONE HCL 0.4 MG/ML IJ SOLN
INTRAMUSCULAR | Status: AC
  Filled 2015-12-14: qty 1
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
## 2015-12-14 NOTE — Progress Notes (Signed)
Subjective:  No complaints overnight, anxious to go home  Objective:  Vital Signs in the last 24 hours: Temp:  [97.6 F (36.4 C)-98.6 F (37 C)] 98.2 F (36.8 C) (04/21 0538) Pulse Rate:  [58-67] 67 (04/21 0538) Resp:  [18-20] 20 (04/21 0538) BP: (105-130)/(58-68) 117/63 mmHg (04/21 0538) SpO2:  [93 %-100 %] 98 % (04/21 0732)  Intake/Output from previous day:  Intake/Output Summary (Last 24 hours) at 12/14/15 1002 Last data filed at 12/14/15 0500  Gross per 24 hour  Intake    540 ml  Output   1300 ml  Net   -760 ml    Physical Exam: General appearance: alert, cooperative and no distress Lungs: decreased breath sounds c/w COPD Heart: regular rate and rhythm Extremities: no edema Neurologic: Grossly normal   Rate: 50-65  Rhythm: normal sinus rhythm and sinus bradycardia  Lab Results:  Recent Labs  12/12/15 1951 12/13/15 0126  WBC 4.5 4.6  HGB 11.9* 11.7*  PLT 183 197    Recent Labs  12/12/15 1055 12/12/15 1951 12/13/15 0126  NA 141  --  139  K 3.6  --  3.8  CL 107  --  104  CO2 24  --  25  GLUCOSE 100*  --  102*  BUN 14  --  16  CREATININE 1.45* 1.46* 1.39*    Recent Labs  12/12/15 1951 12/13/15 0126  TROPONINI <0.03 <0.03    Recent Labs  12/12/15 1055  INR 0.98    Scheduled Meds: . albuterol  2.5 mg Nebulization QID  . allopurinol  200 mg Oral Daily  . amLODipine  5 mg Oral Daily  . aspirin EC  81 mg Oral Daily  . atorvastatin  20 mg Oral q1800  . enoxaparin (LOVENOX) injection  40 mg Subcutaneous Q24H  . insulin aspart  0-9 Units Subcutaneous TID WC  . lisinopril  20 mg Oral Daily  . mometasone-formoterol  2 puff Inhalation BID  . montelukast  10 mg Oral QHS  . naloxone      . pantoprazole  40 mg Oral Daily  . sodium chloride flush  3 mL Intravenous Q12H   Continuous Infusions:  PRN Meds:.sodium chloride, traMADol   Imaging: Imaging results have been reviewed  Cardiac Studies: Echo 12/13/15 Study Conclusions  -  Left ventricle: The cavity size was normal. Wall thickness was  increased in a pattern of mild LVH. Systolic function was mildly  reduced. The estimated ejection fraction was in the range of 45%  to 50%. Wall motion was normal; there were no regional wall  motion abnormalities. Doppler parameters are consistent with  abnormal left ventricular relaxation (grade 1 diastolic  dysfunction). - Left atrium: The atrium was mildly dilated. - Right ventricle: The cavity size was mildly dilated. Wall  thickness was normal. - Right atrium: The atrium was mildly dilated.   Assessment/Plan:  70 y/o with multiple medica issues- DM, HTN, CRI-3, asthmatic COPD, HLD, and cocaine use (UDS positive this adm). 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   Principal Problem:   Syncope Active Problems:   Diabetes mellitus with renal complications (HCC)   Essential hypertension   Asthma   COPD (chronic obstructive pulmonary disease) (HCC)   Cocaine use   Chronic renal disease, stage III   Abnormal echocardiogram   Seizures (HCC)   Tobacco use  disorder   PLAN: He has had no significant arrhythmia on telemetry. He is agreeable to an OP 30 day event monitor- I will arrange.   Corine ShelterLuke Kilroy PA-C 12/14/2015, 10:02 AM [phone]  The patient has been seen in conjunction with loop Kilroy, PA-C. All aspects of care have been considered and discussed. The patient has been personally interviewed, examined, and all clinical data has been reviewed.   The plan is as outlined above. Thirty-day continuous monitor will be arranged.  No further inpatient workup required from our standpoint.  Final clinical diagnosis is micturition syncope.

## 2015-12-14 NOTE — Discharge Instructions (Signed)
Cardiac Event Monitoring °A cardiac event monitor is a small recording device used to help detect abnormal heart rhythms (arrhythmias). The monitor is used to record heart rhythm when noticeable symptoms such as the following occur: °· Fast heartbeats (palpitations), such as heart racing or fluttering. °· Dizziness. °· Fainting or light-headedness. °· Unexplained weakness. °The monitor is wired to two electrodes placed on your chest. Electrodes are flat, sticky disks that attach to your skin. The monitor can be worn for up to 30 days. You will wear the monitor at all times, except when bathing.  °HOW TO USE YOUR CARDIAC EVENT MONITOR °A technician will prepare your chest for the electrode placement. The technician will show you how to place the electrodes, how to work the monitor, and how to replace the batteries. Take time to practice using the monitor before you leave the office. Make sure you understand how to send the information from the monitor to your health care provider. This requires a telephone with a landline, not a cell phone. You need to: °· Wear your monitor at all times, except when you are in water: °· Do not get the monitor wet. °· Take the monitor off when bathing. Do not swim or use a hot tub with it on. °· Keep your skin clean. Do not put body lotion or moisturizer on your chest. °· Change the electrodes daily or any time they stop sticking to your skin. You might need to use tape to keep them on. °· It is possible that your skin under the electrodes could become irritated. To keep this from happening, try to put the electrodes in slightly different places on your chest. However, they must remain in the area under your left breast and in the upper right section of your chest. °· Make sure the monitor is safely clipped to your clothing or in a location close to your body that your health care provider recommends. °· Press the button to record when you feel symptoms of heart trouble, such as  dizziness, weakness, light-headedness, palpitations, thumping, shortness of breath, unexplained weakness, or a fluttering or racing heart. The monitor is always on and records what happened slightly before you pressed the button, so do not worry about being too late to get good information. °· Keep a diary of your activities, such as walking, doing chores, and taking medicine. It is especially important to note what you were doing when you pushed the button to record your symptoms. This will help your health care provider determine what might be contributing to your symptoms. The information stored in your monitor will be reviewed by your health care provider alongside your diary entries. °· Send the recorded information as recommended by your health care provider. It is important to understand that it will take some time for your health care provider to process the results. °· Change the batteries as recommended by your health care provider. °SEEK IMMEDIATE MEDICAL CARE IF:  °· You have chest pain. °· You have extreme difficulty breathing or shortness of breath. °· You develop a very fast heartbeat that persists. °· You develop dizziness that does not go away. °· You faint or constantly feel you are about to faint. °  °This information is not intended to replace advice given to you by your health care provider. Make sure you discuss any questions you have with your health care provider. °  °Document Released: 05/20/2008 Document Revised: 09/01/2014 Document Reviewed: 02/07/2013 °Elsevier Interactive Patient Education ©2016 [employer] ° °  Near-Syncope °Near-syncope (commonly known as near fainting) is sudden weakness, dizziness, or feeling like you might pass out. During an episode of near-syncope, you may also develop pale skin, have tunnel vision, or feel sick to your stomach (nauseous). Near-syncope may occur when getting up after sitting or while standing for a long time. It is caused by a sudden decrease in  blood flow to the brain. This decrease can result from various causes or triggers, most of which are not serious. However, because near-syncope can sometimes be a sign of something serious, a medical evaluation is required. The specific cause is often not determined. °HOME CARE INSTRUCTIONS  °Monitor your condition for any changes. The following actions may help to alleviate any discomfort you are experiencing: °· Have someone stay with you until you feel stable. °· Lie down right away and prop your feet up if you start feeling like you might faint. Breathe deeply and steadily. Wait until all the symptoms have passed. Most of these episodes last only a few minutes. You may feel tired for several hours.   °· Drink enough fluids to keep your urine clear or pale yellow.   °· If you are taking blood pressure or heart medicine, get up slowly when seated or lying down. Take several minutes to sit and then stand. This can reduce dizziness. °· Follow up with your health care provider as directed.  °SEEK IMMEDIATE MEDICAL CARE IF:  °· You have a severe headache.   °· You have unusual pain in the chest, abdomen, or back.   °· You are bleeding from the mouth or rectum, or you have black or tarry stool.   °· You have an irregular or very fast heartbeat.   °· You have repeated fainting or have seizure-like jerking during an episode.   °· You faint when sitting or lying down.   °· You have confusion.   °· You have difficulty walking.   °· You have severe weakness.   °· You have vision problems.   °MAKE SURE YOU:  °· Understand these instructions. °· Will watch your condition. °· Will get help right away if you are not doing well or get worse. °  °This information is not intended to replace advice given to you by your health care provider. Make sure you discuss any questions you have with your health care provider. °  °Document Released: 08/11/2005 Document Revised: 08/16/2013 Document Reviewed: 01/14/2013 °Elsevier Interactive  Patient Education ©2016 Elsevier Inc. ° °

## 2015-12-19 ENCOUNTER — Ambulatory Visit (INDEPENDENT_AMBULATORY_CARE_PROVIDER_SITE_OTHER): Payer: Medicare Other

## 2015-12-19 DIAGNOSIS — R55 Syncope and collapse: Secondary | ICD-10-CM | POA: Diagnosis not present

## 2015-12-19 DIAGNOSIS — R001 Bradycardia, unspecified: Secondary | ICD-10-CM | POA: Diagnosis not present

## 2016-01-25 ENCOUNTER — Ambulatory Visit (INDEPENDENT_AMBULATORY_CARE_PROVIDER_SITE_OTHER): Payer: Medicare Other | Admitting: Physician Assistant

## 2016-01-25 ENCOUNTER — Encounter: Payer: Self-pay | Admitting: Physician Assistant

## 2016-01-25 VITALS — BP 138/72 | HR 56 | Ht 68.0 in | Wt 173.8 lb

## 2016-01-25 DIAGNOSIS — N183 Chronic kidney disease, stage 3 (moderate): Secondary | ICD-10-CM | POA: Diagnosis not present

## 2016-01-25 DIAGNOSIS — I10 Essential (primary) hypertension: Secondary | ICD-10-CM

## 2016-01-25 DIAGNOSIS — E0821 Diabetes mellitus due to underlying condition with diabetic nephropathy: Secondary | ICD-10-CM

## 2016-01-25 DIAGNOSIS — F1490 Cocaine use, unspecified, uncomplicated: Secondary | ICD-10-CM

## 2016-01-25 DIAGNOSIS — J449 Chronic obstructive pulmonary disease, unspecified: Secondary | ICD-10-CM | POA: Diagnosis not present

## 2016-01-25 DIAGNOSIS — I429 Cardiomyopathy, unspecified: Secondary | ICD-10-CM

## 2016-01-25 DIAGNOSIS — R55 Syncope and collapse: Secondary | ICD-10-CM | POA: Diagnosis not present

## 2016-01-25 DIAGNOSIS — R0609 Other forms of dyspnea: Secondary | ICD-10-CM

## 2016-01-25 DIAGNOSIS — F1410 Cocaine abuse, uncomplicated: Secondary | ICD-10-CM

## 2016-01-25 HISTORY — DX: Cardiomyopathy, unspecified: I42.9

## 2016-01-25 NOTE — Patient Instructions (Addendum)
Medication Instructions:  1. Your physician recommends that you continue on your current medications as directed. Please refer to the Current Medication list given to you today. Labwork: NONE Testing/Procedures: Your physician has requested that you have a lexiscan myoview. For further information please visit [email or web address]. Please follow instruction sheet, as given. PT REQUEST THIS TO BE ON A Friday.  Follow-Up: Your physician wants you to follow-up in: 6 MONTHS WITH DR. Katrinka BlazingSMITH.  You will receive a reminder letter in the mail two months in advance. If you don't receive a letter, please call our office to schedule the follow-up appointment. Any Other Special Instructions Will Be Listed Below (If Applicable). If you need a refill on your cardiac medications before your next appointment, please call your pharmacy.

## 2016-01-25 NOTE — Progress Notes (Addendum)
Cardiology Office Note:    Date:  01/25/2016   ID:  Luis Carter, DOB [birth year], MRN [id number]  PCP:  Rogelia Boga, MD  Cardiologist:  Dr. Verdis Prime   Electrophysiologist:  n/a  Referring MD: Gordy Savers, MD   Chief Complaint  Patient presents with  . Hospitalization Follow-up    Admx with Syncope    History of Present Illness:     Luis Carter is a 70 y.o. male with a hx of Diabetes, HTN, CKD, HL, COPD, cocaine abuse. Patient has a history of possible myocardial infarction in 2001. He had an elevated CK-MB. Nuclear stress test was negative. He has never undergone cardiac catheterization. Nuclear stress test in 2010 was low risk. Admitted 4/19-4/21 With syncope. History was suggestive of post micturition syncope. Echocardiogram demonstrated EF 45-50%. He was noted to have bradycardia with heart rates <60. Outpatient 30 day event monitor was recommended.  Available reports from the event monitor demonstrated normal sinus rhythm with PVCs and no significant bradycardia arrhythmias.  Returns for follow-up. Here today with his stepdaughter. He denies recurrent syncope. He was fairly active up until about a year ago. He notes dyspnea with exertion.  He denies chest discomfort. He denies orthopnea, PND or edema.  Past Medical History  Diagnosis Date  . COPD 04/14/2007  . HYPERTENSION 04/14/2007  . LEG PAIN, RIGHT 05/19/2008  . LOW BACK PAIN 04/14/2007  . PANCREATITIS, HX OF 04/14/2007  . Coronary artery disease   . Gout   . GI bleed   . DIABETES MELLITUS, TYPE II 04/14/2007  . Shortness of breath     "related to asthma" (03/22/2013)  . GERD (gastroesophageal reflux disease)   . Myocardial infarction (HCC)     ~ 2001/notes 09/13/2001 (03/22/2013)  . High cholesterol   . ASTHMA     since childhood  . Pneumonia     "twice" (12/12/2015)  . SEIZURE DISORDER 04/14/2007    "used to have them when I was young" (03/22/2013)  . Arthritis     "left arm/shoulder;  both legs" (12/12/2015)  . Cardiomyopathy (HCC) 01/25/2016    Echo 12/13/15 - Mild LVH, EF 45-50%, normal wall motion, grade 1 diastolic dysfunction, mild LAE, mild RVE, mild RAE    Past Surgical History  Procedure Laterality Date  . Esophagogastroduodenoscopy  12/21/2011    Procedure: ESOPHAGOGASTRODUODENOSCOPY (EGD);  Surgeon: Louis Meckel, MD;  Location: [facility] ENDOSCOPY;  Service: Endoscopy;  Laterality: N/A;  . Coronary angioplasty with stent placement      Current Medications: Outpatient Prescriptions Prior to Visit  Medication Sig Dispense Refill  . albuterol (PROAIR HFA) 108 (90 BASE) MCG/ACT inhaler INHALE 2 PUFFS INTO THE LUNGS EVERY 6 (SIX) HOURS AS NEEDED FOR WHEEZING. 3 Inhaler 3  . allopurinol (ZYLOPRIM) 100 MG tablet Take 2 tablets (200 mg total) by mouth daily. 60 tablet 5  . amLODipine (NORVASC) 5 MG tablet TAKE 1 TABLET BY MOUTH EVERY DAY 90 tablet 1  . atorvastatin (LIPITOR) 20 MG tablet TAKE 1 TABLET BY MOUTH EVERY DAY 90 tablet 1  . colchicine (COLCRYS) 0.6 MG tablet TAKE 1 TABLET BY MOUTH TWICE A DAY AS NEEDED FOR GOUT FLARE-UPS 60 tablet 5  . [facility] ASPIRIN LOW DOSE 81 MG EC tablet TAKE 1 TABLET BY MOUTH EVERY DAY 30 tablet 5  . cyclobenzaprine (FLEXERIL) 10 MG tablet TAKE 1 TABLET BY MOUTH AT BEDTIME 30 tablet 5  . diclofenac sodium (VOLTAREN) 1 % GEL APPLY TO AFFECTED AREA 4 TIMES A  DAY 100 g 3  . lisinopril (PRINIVIL,ZESTRIL) 20 MG tablet Take 1 tablet (20 mg total) by mouth daily. 90 tablet 1  . metFORMIN (GLUCOPHAGE) 500 MG tablet TAKE 1 TABLET BY MOUTH TWICE A DAY 180 tablet 1  . montelukast (SINGULAIR) 10 MG tablet Take 1 tablet (10 mg total) by mouth at bedtime. 90 tablet 3  . omeprazole (PRILOSEC) 20 MG capsule Take 1 capsule (20 mg total) by mouth daily. 90 capsule 3  . traMADol (ULTRAM) 50 MG tablet TAKE 1 TABLET BY MOUTH EVERY 8 HOURS AS NEEDED FOR PAIN 90 tablet 2  . triamcinolone cream (KENALOG) 0.1 % Apply 1 application topically 2 (two) times daily. 30 g 0    . Fluticasone-Salmeterol (ADVAIR DISKUS) 250-50 MCG/DOSE AEPB INHALE 1 PUFF INTO THE LUNGS EVERY 12 HOURS. (Patient taking differently: Inhale 1 puff into the lungs every 12 (twelve) hours. INHALE 1 PUFF INTO THE LUNGS EVERY 12 HOURS.) 180 each 3   No facility-administered medications prior to visit.      Allergies:   Review of patient's allergies indicates no known allergies.   Social History   Social History  . Marital Status: Married    Spouse Name: N/A  . Number of Children: N/A  . Years of Education: N/A   Social History Main Topics  . Smoking status: Former Smoker -- 0.25 packs/day for 57 years    Types: Cigarettes    Quit date: 11/24/2015  . Smokeless tobacco: Never Used  . Alcohol Use: Yes     Comment: 12/12/2015 "quit drinking years ago; never had problem w/it"  . Drug Use: No  . Sexual Activity: No   Other Topics Concern  . None   Social History Narrative     Family History:  The patient's family history includes Heart disease in his brother, brother, brother, brother, and sister.   ROS:   Please see the history of present illness.    ROS All other systems reviewed and are negative.   Physical Exam:    VS:  BP 138/72 mmHg  Pulse 56  Ht  (1.727 m)  Wt 173 lb 12.8 oz (78.835 kg)  BMI 26.43 kg/m2   Physical Exam  Constitutional: He is oriented to person, place, and time. He appears well-developed and well-nourished.  HENT:  Head: Normocephalic and atraumatic.  Neck: Normal range of motion. No JVD present.  Cardiovascular: Normal rate, regular rhythm and normal heart sounds.   No murmur heard. Pulmonary/Chest: Breath sounds normal. He has no wheezes. He has no rales.  Abdominal: Soft. There is no tenderness.  Musculoskeletal: Normal range of motion. He exhibits no edema.  Neurological: He is alert and oriented to person, place, and time.  Skin: Skin is warm and dry.  Psychiatric: He has a normal mood and affect.    Wt Readings from Last 3  Encounters:  01/25/16 173 lb 12.8 oz (78.835 kg)  12/13/15 174 lb 14.4 oz (79.334 kg)  09/07/15 175 lb (79.379 kg)      Studies/Labs Reviewed:     EKG:  EKG is  ordered today.  The ekg ordered today demonstrates Sinus bradycardia, HR 57, normal axis, J-point elevation, QTc 416 ms  Recent Labs: 09/07/2015: TSH 2.41 12/12/2015: ALT 16* 12/13/2015: BUN 16; Creatinine, Ser 1.39*; Hemoglobin 11.7*; Platelets 197; Potassium 3.8; Sodium 139   Recent Lipid Panel    Component Value Date/Time   CHOL 117 09/07/2015 0923   TRIG 54.0 09/07/2015 0923   HDL 45.80 09/07/2015 0923  CHOLHDL 3 09/07/2015 0923   VLDL 10.8 09/07/2015 0923   LDLCALC 60 09/07/2015 0923    Additional studies/ records that were reviewed today include:   Echo 12/13/15 Mild LVH, EF 45-50%, normal wall motion, grade 1 diastolic dysfunction, mild LAE, mild RVE, mild RAE  Myoview 11/2008 EF 59%, no ischemia  ASSESSMENT:     1. Syncope, unspecified syncope type   2. Cardiomyopathy (HCC)   3. DOE (dyspnea on exertion)   4. Essential hypertension   5. Chronic obstructive pulmonary disease, unspecified COPD type (HCC)   6. Diabetes mellitus due to underlying condition with diabetic nephropathy, without long-term current use of insulin (HCC)   7. Cocaine use     PLAN:     In order of problems listed above:  1. Syncope - Patient's history during evaluation hospital was consistent with post micturition syncope. EF was 45-50% by echocardiogram. With chronic bradycardia, there was some concern for sinus node dysfunction. Outpatient event monitor has been performed and as noted above, available strips demonstrated sinus rhythm with PVCs and no significant bradycardia arrhythmias.  He denies recurrent syncope. No further workup at this time.  2. Cardiomyopathy - As noted, EF 45-50% by recent echocardiogram. Nuclear stress test in 2010 with EF 59%. Wall motion was normal on recent echo.  Continue ACE inhibitor. Heart rate is  too low to start beta blocker. He does have a history of dyspnea with significant risk factors for CAD including diabetes. I reviewed his case today with Dr. Katrinka BlazingSmith. We will proceed with stress testing to rule out ischemic heart disease.  -  Arrange Lexiscan Myoview    3. Dyspnea - I suspect that this is multifactorial. However, given mildly reduced LV function and CAD risk equivalent with diabetes, we will proceed with stress testing as outlined above.  4. HTN -  Blood pressure with marginal control.  5. COPD -  His dyspnea is likely related to COPD as well.    6. Diabetes -Managed by PCP. Last A1c with good control.  Lab Results  Component Value Date   HGBA1C 6.4 09/07/2015    7. Cocaine use - He denies current drug use.   Medication Adjustments/Labs and Tests Ordered: Current medicines are reviewed at length with the patient today.  Concerns regarding medicines are outlined above.  Medication changes, Labs and Tests ordered today are outlined in the Patient Instructions noted below. Patient Instructions  Medication Instructions:  1. Your physician recommends that you continue on your current medications as directed. Please refer to the Current Medication list given to you today. Labwork: NONE Testing/Procedures: Your physician has requested that you have a lexiscan myoview. For further information please visit [email or web address]. Please follow instruction sheet, as given. PT REQUEST THIS TO BE ON A Friday.  Follow-Up: Your physician wants you to follow-up in: 6 MONTHS WITH DR. Katrinka BlazingSMITH.  You will receive a reminder letter in the mail two months in advance. If you don't receive a letter, please call our office to schedule the follow-up appointment. Any Other Special Instructions Will Be Listed Below (If Applicable). If you need a refill on your cardiac medications before your next appointment, please call your pharmacy.    Signed, Tereso NewcomerScott Sonja Manseau, PA-C  01/25/2016 10:44 AM    [facility] [address], [place], [place]  [address] Phone: [phone]; Fax: [phone]

## 2016-02-04 ENCOUNTER — Telehealth (HOSPITAL_COMMUNITY): Payer: Self-pay | Admitting: *Deleted

## 2016-02-04 NOTE — Telephone Encounter (Signed)
Left message on voicemail in reference to upcoming appointment scheduled for 02/08/16. Phone number given for a call back so details instructions can be given. Grover Woodfield J Chianna Spirito, RN  

## 2016-02-05 ENCOUNTER — Telehealth (HOSPITAL_COMMUNITY): Payer: Self-pay | Admitting: *Deleted

## 2016-02-05 NOTE — Telephone Encounter (Signed)
Patient given detailed instructions per Myocardial Perfusion Study Information Sheet for the test on 02/08/16 at 0745. Patient notified to arrive 15 minutes early and that it is imperative to arrive on time for appointment to keep from having the test rescheduled.  If you need to cancel or reschedule your appointment, please call the office within 24 hours of your appointment. Failure to do so may result in a cancellation of your appointment, and a $50 no show fee. Patient verbalized understanding.Sriyan Cutting, Adelene IdlerCynthia W

## 2016-02-08 ENCOUNTER — Encounter: Payer: Self-pay | Admitting: Physician Assistant

## 2016-02-08 ENCOUNTER — Ambulatory Visit (HOSPITAL_COMMUNITY): Payer: Medicare Other | Attending: Internal Medicine

## 2016-02-08 DIAGNOSIS — I10 Essential (primary) hypertension: Secondary | ICD-10-CM | POA: Insufficient documentation

## 2016-02-08 DIAGNOSIS — E119 Type 2 diabetes mellitus without complications: Secondary | ICD-10-CM | POA: Diagnosis not present

## 2016-02-08 DIAGNOSIS — R0609 Other forms of dyspnea: Secondary | ICD-10-CM | POA: Diagnosis not present

## 2016-02-08 DIAGNOSIS — I429 Cardiomyopathy, unspecified: Secondary | ICD-10-CM | POA: Diagnosis not present

## 2016-02-08 LAB — MYOCARDIAL PERFUSION IMAGING
CHL CUP NUCLEAR SDS: 3
CHL CUP RESTING HR STRESS: 54 {beats}/min
LV dias vol: 108 mL (ref 62–150)
LV sys vol: 50 mL
Peak HR: 79 {beats}/min
RATE: 0.28
SRS: 1
SSS: 4
TID: 1.11

## 2016-02-08 MED ORDER — TECHNETIUM TC 99M TETROFOSMIN IV KIT
31.6000 | PACK | Freq: Once | INTRAVENOUS | Status: AC | PRN
  Administered 2016-02-08: 32 via INTRAVENOUS
  Filled 2016-02-08: qty 32

## 2016-02-08 MED ORDER — REGADENOSON 0.4 MG/5ML IV SOLN
0.4000 mg | Freq: Once | INTRAVENOUS | Status: AC
  Administered 2016-02-08: 0.4 mg via INTRAVENOUS

## 2016-02-08 MED ORDER — TECHNETIUM TC 99M TETROFOSMIN IV KIT
10.4000 | PACK | Freq: Once | INTRAVENOUS | Status: AC | PRN
  Administered 2016-02-08: 10 via INTRAVENOUS
  Filled 2016-02-08: qty 10

## 2016-03-07 ENCOUNTER — Encounter: Payer: Self-pay | Admitting: Internal Medicine

## 2016-03-07 ENCOUNTER — Ambulatory Visit (INDEPENDENT_AMBULATORY_CARE_PROVIDER_SITE_OTHER): Payer: Medicare Other | Admitting: Internal Medicine

## 2016-03-07 VITALS — BP 124/72 | HR 57 | Temp 97.5°F | Ht 68.0 in | Wt 173.0 lb

## 2016-03-07 DIAGNOSIS — E0821 Diabetes mellitus due to underlying condition with diabetic nephropathy: Secondary | ICD-10-CM | POA: Diagnosis not present

## 2016-03-07 DIAGNOSIS — I10 Essential (primary) hypertension: Secondary | ICD-10-CM

## 2016-03-07 DIAGNOSIS — F17200 Nicotine dependence, unspecified, uncomplicated: Secondary | ICD-10-CM

## 2016-03-07 LAB — HEMOGLOBIN A1C: HEMOGLOBIN A1C: 6.4 % (ref 4.6–6.5)

## 2016-03-07 MED ORDER — TRAMADOL HCL 50 MG PO TABS: 50.0000 mg | ORAL_TABLET | Freq: Three times a day (TID) | ORAL | Status: DC | PRN

## 2016-03-07 NOTE — Progress Notes (Signed)
Pre visit review using our clinic review tool, if applicable. No additional management support is needed unless otherwise documented below in the visit note. 

## 2016-03-07 NOTE — Patient Instructions (Signed)
Limit your sodium (Salt) intake   Please check your hemoglobin A1c every 6 months  Smoking tobacco is very bad for your health. You should stop smoking immediately.    It is important that you exercise regularly, at least 20 minutes 3 to 4 times per week.  If you develop chest pain or shortness of breath seek  medical attention.

## 2016-03-07 NOTE — Progress Notes (Signed)
Subjective:    Patient ID: Luis Carter, male    DOB: 1945-09-02, 70 y.o.   MRN: [id number]  HPI  Lab Results  Component Value Date   HGBA1C 6.4 09/07/2015   70 year old patient who is seen today for his six-month follow-up.  He has a history of type 2 diabetes which has been managed with metformin.  Last hemoglobin A1c was well controlled. He has essential hypertension and chronic kidney disease. He states that he has had an eye examination within the past year  Continues to smoke Main complaint today is bilateral hip, knee and foot pain.  He also describes low back pain.  Since his last visit here, he was admitted to the hospital in April for syncope.  He has seen cardiology in follow-up last month.  No further syncope  Past Medical History  Diagnosis Date  . COPD 04/14/2007  . HYPERTENSION 04/14/2007  . LEG PAIN, RIGHT 05/19/2008  . LOW BACK PAIN 04/14/2007  . PANCREATITIS, HX OF 04/14/2007  . Coronary artery disease   . Gout   . GI bleed   . DIABETES MELLITUS, TYPE II 04/14/2007  . Shortness of breath     "related to asthma" (03/22/2013)  . GERD (gastroesophageal reflux disease)   . Myocardial infarction (HCC)     ~ 2001/notes 09/13/2001 (03/22/2013)  . High cholesterol   . ASTHMA     since childhood  . Pneumonia     "twice" (12/12/2015)  . SEIZURE DISORDER 04/14/2007    "used to have them when I was young" (03/22/2013)  . Arthritis     "left arm/shoulder; both legs" (12/12/2015)  . Cardiomyopathy (HCC) 01/25/2016    Echo 12/13/15 - Mild LVH, EF 45-50%, normal wall motion, grade 1 diastolic dysfunction, mild LAE, mild RVE, mild RAE  . History of nuclear stress test     a.  Myoview 6/17: EF 54%, no ischemia     Social History   Social History  . Marital Status: Married    Spouse Name: N/A  . Number of Children: N/A  . Years of Education: N/A   Occupational History  . Not on file.   Social History Main Topics  . Smoking status: Former Smoker -- 0.25 packs/day  for 57 years    Types: Cigarettes    Quit date: 11/24/2015  . Smokeless tobacco: Never Used  . Alcohol Use: Yes     Comment: 12/12/2015 "quit drinking years ago; never had problem w/it"  . Drug Use: No  . Sexual Activity: No   Other Topics Concern  . Not on file   Social History Narrative    Past Surgical History  Procedure Laterality Date  . Esophagogastroduodenoscopy  12/21/2011    Procedure: ESOPHAGOGASTRODUODENOSCOPY (EGD);  Surgeon: Louis Meckel, MD;  Location[facility] ENDOSCOPY;  Service: Endoscopy;  Laterality: N/A;  . Coronary angioplasty with stent placement      Family History  Problem Relation Age of Onset  . Heart disease Sister   . Heart disease Brother   . Heart disease Brother   . Heart disease Brother   . Heart disease Brother     No Known Allergies  Current Outpatient Prescriptions on File Prior to Visit  Medication Sig Dispense Refill  . albuterol (PROAIR HFA) 108 (90 BASE) MCG/ACT inhaler INHALE 2 PUFFS INTO THE LUNGS EVERY 6 (SIX) HOURS AS NEEDED FOR WHEEZING. 3 Inhaler 3  . allopurinol (ZYLOPRIM) 100 MG tablet Take 2 tablets (200 mg total) by mouth daily.  60 tablet 5  . amLODipine (NORVASC) 5 MG tablet TAKE 1 TABLET BY MOUTH EVERY DAY 90 tablet 1  . atorvastatin (LIPITOR) 20 MG tablet TAKE 1 TABLET BY MOUTH EVERY DAY 90 tablet 1  . colchicine (COLCRYS) 0.6 MG tablet TAKE 1 TABLET BY MOUTH TWICE A DAY AS NEEDED FOR GOUT FLARE-UPS 60 tablet 5  . [facility] ASPIRIN LOW DOSE 81 MG EC tablet TAKE 1 TABLET BY MOUTH EVERY DAY 30 tablet 5  . cyclobenzaprine (FLEXERIL) 10 MG tablet TAKE 1 TABLET BY MOUTH AT BEDTIME 30 tablet 5  . diclofenac sodium (VOLTAREN) 1 % GEL APPLY TO AFFECTED AREA 4 TIMES A DAY 100 g 3  . Fluticasone-Salmeterol (ADVAIR) 250-50 MCG/DOSE AEPB Inhale 1 puff into the lungs 2 (two) times daily.    Marland Kitchen lisinopril (PRINIVIL,ZESTRIL) 20 MG tablet Take 1 tablet (20 mg total) by mouth daily. 90 tablet 1  . metFORMIN (GLUCOPHAGE) 500 MG tablet TAKE 1 TABLET  BY MOUTH TWICE A DAY 180 tablet 1  . montelukast (SINGULAIR) 10 MG tablet Take 1 tablet (10 mg total) by mouth at bedtime. 90 tablet 3  . omeprazole (PRILOSEC) 20 MG capsule Take 1 capsule (20 mg total) by mouth daily. 90 capsule 3  . traMADol (ULTRAM) 50 MG tablet TAKE 1 TABLET BY MOUTH EVERY 8 HOURS AS NEEDED FOR PAIN 90 tablet 2  . triamcinolone cream (KENALOG) 0.1 % Apply 1 application topically 2 (two) times daily. 30 g 0   No current facility-administered medications on file prior to visit.    BP 124/72 mmHg  Pulse 57  Temp(Src) 97.5 F (36.4 C) (Oral)  Ht 5\' 8"  (1.727 m)  Wt 173 lb (78.472 kg)  BMI 26.31 kg/m2  SpO2 97%     Review of Systems  Constitutional: Negative for fever, chills, appetite change and fatigue.  HENT: Negative for congestion, dental problem, ear pain, hearing loss, sore throat, tinnitus, trouble swallowing and voice change.   Eyes: Negative for pain, discharge and visual disturbance.  Respiratory: Negative for cough, chest tightness, wheezing and stridor.   Cardiovascular: Negative for chest pain, palpitations and leg swelling.  Gastrointestinal: Negative for nausea, vomiting, abdominal pain, diarrhea, constipation, blood in stool and abdominal distention.  Genitourinary: Negative for urgency, hematuria, flank pain, discharge, difficulty urinating and genital sores.  Musculoskeletal: Positive for back pain, arthralgias and gait problem. Negative for myalgias, joint swelling and neck stiffness.  Skin: Negative for rash.  Neurological: Negative for dizziness, syncope, speech difficulty, weakness, numbness and headaches.  Hematological: Negative for adenopathy. Does not bruise/bleed easily.  Psychiatric/Behavioral: Negative for behavioral problems and dysphoric mood. The patient is not nervous/anxious.        Objective:   Physical Exam  Constitutional: He is oriented to person, place, and time. He appears well-developed.  HENT:  Head: Normocephalic.    Right Ear: External ear normal.  Left Ear: External ear normal.  Eyes: Conjunctivae and EOM are normal.  Neck: Normal range of motion.  Cardiovascular: Normal rate and normal heart sounds.   Pulmonary/Chest: Effort normal. No respiratory distress.  A few scattered rhonchi.  O2 saturation 97%  Abdominal: Bowel sounds are normal.  Musculoskeletal: Normal range of motion. He exhibits no edema or tenderness.  Neurological: He is alert and oriented to person, place, and time.  Psychiatric: He has a normal mood and affect. His behavior is normal.          Assessment & Plan:   Diabetes mellitus.  Appears to be stable.  We'll check a hemoglobin A1c Essential hypertension, well-controlled History of syncope, no recurrence Ongoing tobacco use.  Total smoking cessation encouraged  Follow-up 6 months  Rogelia BogaKWIATKOWSKI,Marieta Markov FRANK, MD

## 2016-06-01 ENCOUNTER — Other Ambulatory Visit: Payer: Self-pay | Admitting: Internal Medicine

## 2016-07-23 DIAGNOSIS — E538 Deficiency of other specified B group vitamins: Secondary | ICD-10-CM | POA: Diagnosis not present

## 2016-07-23 DIAGNOSIS — M1A9XX0 Chronic gout, unspecified, without tophus (tophi): Secondary | ICD-10-CM | POA: Diagnosis not present

## 2016-07-23 DIAGNOSIS — E559 Vitamin D deficiency, unspecified: Secondary | ICD-10-CM | POA: Diagnosis not present

## 2016-07-23 DIAGNOSIS — E784 Other hyperlipidemia: Secondary | ICD-10-CM | POA: Diagnosis not present

## 2016-07-23 DIAGNOSIS — J449 Chronic obstructive pulmonary disease, unspecified: Secondary | ICD-10-CM | POA: Diagnosis not present

## 2016-07-23 DIAGNOSIS — E0842 Diabetes mellitus due to underlying condition with diabetic polyneuropathy: Secondary | ICD-10-CM | POA: Diagnosis not present

## 2016-07-23 DIAGNOSIS — E1142 Type 2 diabetes mellitus with diabetic polyneuropathy: Secondary | ICD-10-CM | POA: Diagnosis not present

## 2016-07-23 DIAGNOSIS — I10 Essential (primary) hypertension: Secondary | ICD-10-CM | POA: Diagnosis not present

## 2016-08-21 ENCOUNTER — Other Ambulatory Visit: Payer: Self-pay | Admitting: Internal Medicine

## 2016-08-21 ENCOUNTER — Ambulatory Visit
Admission: RE | Admit: 2016-08-21 | Discharge: 2016-08-21 | Disposition: A | Discharge status: Home / Self Care | Payer: Medicare Other | Source: Ambulatory Visit | Attending: Internal Medicine | Admitting: Internal Medicine

## 2016-08-21 DIAGNOSIS — M5136 Other intervertebral disc degeneration, lumbar region: Secondary | ICD-10-CM

## 2016-08-21 DIAGNOSIS — M47816 Spondylosis without myelopathy or radiculopathy, lumbar region: Secondary | ICD-10-CM | POA: Diagnosis not present

## 2016-08-21 DIAGNOSIS — M19012 Primary osteoarthritis, left shoulder: Secondary | ICD-10-CM | POA: Diagnosis not present

## 2016-09-03 DIAGNOSIS — J449 Chronic obstructive pulmonary disease, unspecified: Secondary | ICD-10-CM | POA: Diagnosis not present

## 2016-09-03 DIAGNOSIS — E1142 Type 2 diabetes mellitus with diabetic polyneuropathy: Secondary | ICD-10-CM | POA: Diagnosis not present

## 2016-09-03 DIAGNOSIS — M12812 Other specific arthropathies, not elsewhere classified, left shoulder: Secondary | ICD-10-CM | POA: Diagnosis not present

## 2016-09-03 DIAGNOSIS — M5136 Other intervertebral disc degeneration, lumbar region: Secondary | ICD-10-CM | POA: Diagnosis not present

## 2016-09-03 DIAGNOSIS — I10 Essential (primary) hypertension: Secondary | ICD-10-CM | POA: Diagnosis not present

## 2016-09-05 ENCOUNTER — Ambulatory Visit: Payer: Medicare Other | Admitting: Internal Medicine

## 2016-09-14 ENCOUNTER — Emergency Department (HOSPITAL_COMMUNITY): Payer: Medicare Other

## 2016-09-14 ENCOUNTER — Emergency Department (HOSPITAL_COMMUNITY)
Admission: EM | Admit: 2016-09-14 | Discharge: 2016-09-14 | Disposition: A | Discharge status: Home / Self Care | Payer: Medicare Other | Attending: Emergency Medicine | Admitting: Emergency Medicine

## 2016-09-14 ENCOUNTER — Encounter (HOSPITAL_COMMUNITY): Payer: Self-pay | Admitting: Emergency Medicine

## 2016-09-14 DIAGNOSIS — R062 Wheezing: Secondary | ICD-10-CM | POA: Insufficient documentation

## 2016-09-14 DIAGNOSIS — E1122 Type 2 diabetes mellitus with diabetic chronic kidney disease: Secondary | ICD-10-CM | POA: Diagnosis not present

## 2016-09-14 DIAGNOSIS — Z87891 Personal history of nicotine dependence: Secondary | ICD-10-CM | POA: Diagnosis not present

## 2016-09-14 DIAGNOSIS — I2510 Atherosclerotic heart disease of native coronary artery without angina pectoris: Secondary | ICD-10-CM | POA: Insufficient documentation

## 2016-09-14 DIAGNOSIS — N183 Chronic kidney disease, stage 3 (moderate): Secondary | ICD-10-CM | POA: Insufficient documentation

## 2016-09-14 DIAGNOSIS — Z79899 Other long term (current) drug therapy: Secondary | ICD-10-CM | POA: Insufficient documentation

## 2016-09-14 DIAGNOSIS — Z20828 Contact with and (suspected) exposure to other viral communicable diseases: Secondary | ICD-10-CM | POA: Insufficient documentation

## 2016-09-14 DIAGNOSIS — Z7984 Long term (current) use of oral hypoglycemic drugs: Secondary | ICD-10-CM | POA: Diagnosis not present

## 2016-09-14 DIAGNOSIS — R05 Cough: Secondary | ICD-10-CM | POA: Diagnosis not present

## 2016-09-14 DIAGNOSIS — J441 Chronic obstructive pulmonary disease with (acute) exacerbation: Secondary | ICD-10-CM | POA: Diagnosis not present

## 2016-09-14 DIAGNOSIS — I252 Old myocardial infarction: Secondary | ICD-10-CM | POA: Diagnosis not present

## 2016-09-14 DIAGNOSIS — Z955 Presence of coronary angioplasty implant and graft: Secondary | ICD-10-CM | POA: Insufficient documentation

## 2016-09-14 LAB — BASIC METABOLIC PANEL
ANION GAP: 8 (ref 5–15)
BUN: 12 mg/dL (ref 6–20)
CALCIUM: 8.9 mg/dL (ref 8.9–10.3)
CO2: 23 mmol/L (ref 22–32)
Chloride: 105 mmol/L (ref 101–111)
Creatinine, Ser: 1.35 mg/dL — ABNORMAL HIGH (ref 0.61–1.24)
GFR calc non Af Amer: 51 mL/min — ABNORMAL LOW (ref >60)
GFR, EST AFRICAN AMERICAN: 59 mL/min — AB (ref >60)
Glucose, Bld: 109 mg/dL — ABNORMAL HIGH (ref 65–99)
POTASSIUM: 4.3 mmol/L (ref 3.5–5.1)
Sodium: 136 mmol/L (ref 135–145)

## 2016-09-14 LAB — CBC
HCT: 36.5 % — ABNORMAL LOW (ref 39.0–52.0)
HEMOGLOBIN: 12.2 g/dL — AB (ref 13.0–17.0)
MCH: 27.5 pg (ref 26.0–34.0)
MCHC: 33.4 g/dL (ref 30.0–36.0)
MCV: 82.4 fL (ref 78.0–100.0)
Platelets: 274 10*3/uL (ref 150–400)
RBC: 4.43 MIL/uL (ref 4.22–5.81)
RDW: 14.7 % (ref 11.5–15.5)
WBC: 7.7 10*3/uL (ref 4.0–10.5)

## 2016-09-14 LAB — CBG MONITORING, ED: GLUCOSE-CAPILLARY: 78 mg/dL (ref 65–99)

## 2016-09-14 MED ORDER — IPRATROPIUM-ALBUTEROL 0.5-2.5 (3) MG/3ML IN SOLN
3.0000 mL | Freq: Once | RESPIRATORY_TRACT | Status: AC
  Administered 2016-09-14: 3 mL via RESPIRATORY_TRACT
  Filled 2016-09-14: qty 3

## 2016-09-14 MED ORDER — LEVOFLOXACIN 750 MG PO TABS: 750.0000 mg | ORAL_TABLET | Freq: Every day | ORAL | 0 refills | Status: DC

## 2016-09-14 MED ORDER — PREDNISONE 20 MG PO TABS
60.0000 mg | ORAL_TABLET | Freq: Once | ORAL | Status: AC
  Administered 2016-09-14: 60 mg via ORAL
  Filled 2016-09-14: qty 3

## 2016-09-14 MED ORDER — OSELTAMIVIR PHOSPHATE 75 MG PO CAPS: 75.0000 mg | ORAL_CAPSULE | Freq: Two times a day (BID) | ORAL | 0 refills | Status: DC

## 2016-09-14 MED ORDER — LEVOFLOXACIN 750 MG PO TABS
750.0000 mg | ORAL_TABLET | Freq: Once | ORAL | Status: AC
  Administered 2016-09-14: 750 mg via ORAL
  Filled 2016-09-14: qty 1

## 2016-09-14 MED ORDER — PREDNISONE 20 MG PO TABS: 60.0000 mg | ORAL_TABLET | Freq: Every day | ORAL | 0 refills | Status: AC

## 2016-09-14 NOTE — ED Triage Notes (Addendum)
Cough and not feeling well  X 3-4 weeks, states his wife is here in the er and dx with pneu, has felt woozy
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
## 2016-09-17 ENCOUNTER — Ambulatory Visit: Payer: Medicare Other | Admitting: Interventional Cardiology

## 2016-09-18 ENCOUNTER — Ambulatory Visit (INDEPENDENT_AMBULATORY_CARE_PROVIDER_SITE_OTHER): Payer: Self-pay | Admitting: Orthopedic Surgery

## 2016-10-01 ENCOUNTER — Encounter: Payer: Self-pay | Admitting: Interventional Cardiology

## 2016-10-02 ENCOUNTER — Encounter: Payer: Self-pay | Admitting: Interventional Cardiology

## 2016-10-02 ENCOUNTER — Ambulatory Visit (INDEPENDENT_AMBULATORY_CARE_PROVIDER_SITE_OTHER): Payer: Self-pay | Admitting: Orthopedic Surgery

## 2016-10-15 ENCOUNTER — Ambulatory Visit (INDEPENDENT_AMBULATORY_CARE_PROVIDER_SITE_OTHER): Payer: Medicare Other | Admitting: Orthopedic Surgery

## 2016-10-15 ENCOUNTER — Encounter (INDEPENDENT_AMBULATORY_CARE_PROVIDER_SITE_OTHER): Payer: Self-pay | Admitting: Orthopedic Surgery

## 2016-10-15 DIAGNOSIS — R29898 Other symptoms and signs involving the musculoskeletal system: Secondary | ICD-10-CM | POA: Diagnosis not present

## 2016-10-15 DIAGNOSIS — M25512 Pain in left shoulder: Secondary | ICD-10-CM | POA: Diagnosis not present

## 2016-10-15 DIAGNOSIS — M79604 Pain in right leg: Secondary | ICD-10-CM

## 2016-10-15 MED ORDER — LIDOCAINE HCL 1 % IJ SOLN
5.0000 mL | INTRAMUSCULAR | Status: AC | PRN
  Administered 2016-10-15: 5 mL

## 2016-10-15 MED ORDER — METHYLPREDNISOLONE ACETATE 40 MG/ML IJ SUSP
40.0000 mg | INTRAMUSCULAR | Status: AC | PRN
  Administered 2016-10-15: 40 mg via INTRA_ARTICULAR

## 2016-10-15 MED ORDER — BUPIVACAINE HCL 0.5 % IJ SOLN
9.0000 mL | INTRAMUSCULAR | Status: AC | PRN
  Administered 2016-10-15: 9 mL via INTRA_ARTICULAR

## 2016-10-15 NOTE — Progress Notes (Addendum)
Office Visit Note   Patient: Luis Carter           Date of Birth: 07-Sep-1945           MRN: [id number] Visit Date: 10/15/2016 Requested by: Fleet Contras, MD [address] [place], [place] [address] PCP: Rogelia Boga, MD  Subjective: Chief Complaint  Patient presents with  . Left Shoulder - Pain  . Right Leg - Pain, Weakness    HPI Luis Carter is a 71 year old patient with left shoulder pain and right leg pain and bilateral leg weakness.  Describes left shoulder pain for 2-3 months worse over the last month.  He describes decreased range of motion and pain which localizes to the anterior aspect of the shoulder joint.  Does report some numbness and tingling as well as symptoms which go up and down his arm.  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  He has not had an injection or any other treatment options in that left shoulder.  Patient also describes bilateral leg pain and weakness.  Patient's daughter who is with him states that he is following 3 within the past month.  States his whole leg hurts.  Hurts him when he is in bed at night as well.  Start for him to stand and walk after sitting for 15-20 minutes.  He denies any frank low back pain.  He does have a brace but he doesn't wear it much.  The patient describes his legs "giving out".  Radial grafts of the left shoulder on canopy from 12 2017 are reviewed and they show minimal degenerative glenohumeral arthritis.  Patient has diabetes and takes metformin but in general his blood glucoses in good control.  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  No intervention done per patient for this problem.              Review of Systems All systems reviewed are negative as they relate to the chief complaint within the history of present illness.  Patient denies  fevers or  chills.    Assessment & Plan: Visit Diagnoses:  1. Acute pain of left shoulder   2. Pain in right leg   3. Weakness of right leg     Plan: Impression is bursitis left shoulder.  He may have some rotator cuff pathology but his strength is fairly reasonable.  There is no evidence of frozen shoulder.  He is having some hole arm symptoms which makes me think it could be referred pain from his neck.  That's something that can be explored later.  For now I want to treat the most common problem which would be bursitis.  We'll do an injection into the shoulder subacromial space.  Patient will continue to follow his glucose daily.  If his symptoms persist then further imaging can be done but I think he has bigger problems with his back so I'll put that on the back burner for now.  In regards to his leg pain and weakness I think that he has certainly had progression of his severe foraminal stenosis to the point where he may for cryosurgery.  I discussed that potential option with him and he is agreeable if it would help him.  I would like to get MRI scan of the lumbar spine to document the progression of the foraminal stenosis and  set him up to see one of our back surgeons to follow that study. Follow-Up Instructions: No Follow-up on file.   Orders:  Orders Placed This Encounter  Procedures  . MR Lumbar Spine w/o contrast   No orders of the defined types were placed in this encounter.     Procedures: Large Joint Inj Date/Time: 10/15/2016 3:55 PM Performed by: Cammy Copa Authorized by: Cammy Copa   Consent Given by:  Patient Site marked: the procedure site was marked   Timeout: prior to procedure the correct patient, procedure, and site was verified   Indications:  Pain and diagnostic evaluation Location:  Shoulder Site:  L subacromial bursa Prep: patient was prepped and draped in usual sterile fashion   Needle Size:  18 G Needle Length:  1.5 inches Approach:   Posterior Ultrasound Guidance: No   Fluoroscopic Guidance: No   Arthrogram: No   Medications:  5 mL lidocaine 1 %; 9 mL bupivacaine 0.5 %; 40 mg methylPREDNISolone acetate 40 MG/ML Aspiration Attempted: No   Patient tolerance:  Patient tolerated the procedure well with no immediate complications     Clinical Data: No additional findings.  Objective: Vital Signs: There were no vitals taken for this visit.  Physical Exam   Constitutional: Patient appears well-developed HEENT:  Head: Normocephalic Eyes:EOM are normal Neck: Normal range of motion Cardiovascular: Normal rate Pulmonary/chest: Effort normal Neurologic: Patient is alert Skin: Skin is warm Psychiatric: Patient has normal mood and affect    Ortho Exam examination the left shoulder demonstrates pretty reasonable rotator cuff strength to isolatedand a subscap testing on the left and right.  He'll little bit of coarseness with the passive range of motion on the left which is not present on the right.  No acromioclavicular joint tenderness left versus right.  Cervical spine range of motion little bit restricted with lacking 20 of full extension and about 10 of full flexion.  Motor sensory function to both arms intact radial pulses intact bilaterally.  The patient has good EHL dorsiflexion plantar flexion of the ankle as well as quad and hamstring strength.  Hip flexion is weaker bilaterally than I would expect.  There is no groin pain with internal internal rotation leg no trochanteric tenderness is noted.  Reflexes symmetric bilateral patella and Achilles biceps triceps 1+4.  Negative Babinski negative clonus in the bilateral lower chemise.  He'll pulses palpable.  Specialty Comments:  No specialty comments available.  Imaging: No results found.   PMFS History: Patient Active Problem List   Diagnosis Date Noted  . Acute pain of left shoulder 10/15/2016  . Pain in right leg 10/15/2016  . Weakness of right leg  10/15/2016  . Cardiomyopathy (HCC) 01/25/2016  . Chronic renal disease, stage III 12/13/2015  . Abnormal echocardiogram 12/13/2015  . Syncope 12/12/2015  . Tobacco use disorder 09/07/2015  . Gout 01/27/2012  . Cocaine use 12/22/2011  . 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 Essential hypertension 04/14/2007  . Asthma 04/14/2007  . COPD (chronic obstructive pulmonary disease) (HCC) 04/14/2007  . Seizures (HCC) 04/14/2007  . PANCREATITIS, HX OF 04/14/2007   Past Medical History:  Diagnosis Date  . Arthritis    "left arm/shoulder; both legs" (12/12/2015)  . ASTHMA    since childhood  . Cardiomyopathy (HCC) 01/25/2016   Echo 12/13/15 - Mild LVH, EF 45-50%, normal wall motion, grade 1 diastolic dysfunction, mild LAE, mild RVE, mild RAE  . COPD 04/14/2007  . Coronary artery disease   .  DIABETES MELLITUS, TYPE II 04/14/2007  . GERD (gastroesophageal reflux disease)   . GI bleed   . Gout   . High cholesterol   . History of nuclear stress test    a.  Myoview 6/17: EF 54%, no ischemia  . HYPERTENSION 04/14/2007  . LEG PAIN, RIGHT 05/19/2008  . LOW BACK PAIN 04/14/2007  . Myocardial infarction    ~ 2001/notes 09/13/2001 (03/22/2013)  . PANCREATITIS, HX OF 04/14/2007  . Pneumonia    "twice" (12/12/2015)  . SEIZURE DISORDER 04/14/2007   "used to have them when I was young" (03/22/2013)  . Shortness of breath    "related to asthma" (03/22/2013)    Family History  Problem Relation Age of Onset  . Heart disease Sister   . Heart disease Brother   . Heart disease Brother   . Heart disease Brother   . Heart disease Brother     Past Surgical History:  Procedure Laterality Date  . CORONARY ANGIOPLASTY WITH STENT PLACEMENT    . ESOPHAGOGASTRODUODENOSCOPY  12/21/2011   Procedure: ESOPHAGOGASTRODUODENOSCOPY (EGD);  Surgeon: Louis Meckelobert D Kaplan, MD;  [facility];  Service: Endoscopy;  Laterality: N/A;   Social History   Occupational History  . Not on file.    Social History Main Topics  . Smoking status: Former Smoker    Packs/day: 0.25    Years: 57.00    Types: Cigarettes    Quit date: 11/24/2015  . Smokeless tobacco: Never Used  . Alcohol use Yes     Comment: 12/12/2015 "quit drinking years ago; never had problem w/it"  . Drug use: No  . Sexual activity: No

## 2016-11-17 ENCOUNTER — Encounter (INDEPENDENT_AMBULATORY_CARE_PROVIDER_SITE_OTHER): Payer: Self-pay | Admitting: *Deleted

## 2016-11-21 ENCOUNTER — Other Ambulatory Visit: Payer: Self-pay | Admitting: Internal Medicine

## 2016-12-10 ENCOUNTER — Other Ambulatory Visit: Payer: Self-pay

## 2016-12-10 DIAGNOSIS — I10 Essential (primary) hypertension: Secondary | ICD-10-CM | POA: Diagnosis not present

## 2016-12-10 DIAGNOSIS — M12812 Other specific arthropathies, not elsewhere classified, left shoulder: Secondary | ICD-10-CM | POA: Diagnosis not present

## 2016-12-10 DIAGNOSIS — E1142 Type 2 diabetes mellitus with diabetic polyneuropathy: Secondary | ICD-10-CM | POA: Diagnosis not present

## 2016-12-10 DIAGNOSIS — E0842 Diabetes mellitus due to underlying condition with diabetic polyneuropathy: Secondary | ICD-10-CM | POA: Diagnosis not present

## 2016-12-10 DIAGNOSIS — J449 Chronic obstructive pulmonary disease, unspecified: Secondary | ICD-10-CM | POA: Diagnosis not present

## 2016-12-17 ENCOUNTER — Other Ambulatory Visit: Payer: Self-pay | Admitting: Internal Medicine

## 2016-12-17 MED ORDER — ASPIRIN 81 MG PO TBEC: 81.0000 mg | DELAYED_RELEASE_TABLET | Freq: Every day | ORAL | 2 refills | Status: DC

## 2017-03-18 DIAGNOSIS — J449 Chronic obstructive pulmonary disease, unspecified: Secondary | ICD-10-CM | POA: Diagnosis not present

## 2017-03-18 DIAGNOSIS — I10 Essential (primary) hypertension: Secondary | ICD-10-CM | POA: Diagnosis not present

## 2017-03-18 DIAGNOSIS — M5136 Other intervertebral disc degeneration, lumbar region: Secondary | ICD-10-CM | POA: Diagnosis not present

## 2017-03-18 DIAGNOSIS — E1142 Type 2 diabetes mellitus with diabetic polyneuropathy: Secondary | ICD-10-CM | POA: Diagnosis not present

## 2017-04-22 ENCOUNTER — Ambulatory Visit (INDEPENDENT_AMBULATORY_CARE_PROVIDER_SITE_OTHER): Payer: Medicare Other | Admitting: Orthopaedic Surgery

## 2017-04-22 DIAGNOSIS — G8929 Other chronic pain: Secondary | ICD-10-CM

## 2017-04-22 DIAGNOSIS — M5441 Lumbago with sciatica, right side: Secondary | ICD-10-CM

## 2017-04-22 NOTE — Progress Notes (Signed)
Office Visit Note   Patient: Luis Carter           Date of Birth: 1946-02-26           MRN: [id number] Visit Date: 04/22/2017              Requested by: Fleet Contras, MD [address] [place], [place] [address] PCP: Gordy Savers, MD   Assessment & Plan: Visit Diagnoses:  1. Chronic right-sided low back pain with right-sided sciatica     Plan: At this point an MRI is medically warranted of his lumbar spine to rule out herniated disc given his continued right-sided radicular symptoms. He has failed all forms of conservative treatment of this point. I'm concerned about his radicular symptoms and weakness in his right foot. We'll see him back after the MRI. He is requesting epidural steroid injection but we need to see where this potentially could help him. All questions were encouraged and answered.  Follow-Up Instructions: Return in about 2 weeks (around 05/06/2017).   Orders:  No orders of the defined types were placed in this encounter.  No orders of the defined types were placed in this encounter.     Procedures: No procedures performed   Clinical Data: No additional findings.   Subjective: No chief complaint on file. The patient comes in today with continued low back pain with radicular symptoms going down his right leg. He actually saw my partner Dr. August Saucer back in February who had recommended an MRI of his lumbar spine. He continues to have the radicular symptoms. He is a well-controlled diabetic. He denies a nubs and tingling in his feet. He does report weakness in his right leg. He still has the same symptoms consistent with sciatica. Back in February again an MRI was warranted but not done. It is recommended as well.  HPI  Review of Systems He currently denies a change in bowel or bladder function. Denies any headache, chest pain, short of breath, fever, chills, nausea, vomiting.  Objective: Vital Signs: There were no vitals taken for this  visit.  Physical Exam He is alert and oriented 3 and in no acute distress Ortho Exam Examination of his right lower lower extremity does show weakness with dorsiflexion of the foot they're subjective numbness in L5 distribution as well. He has weak reflexes on the right comparing the right to left. He has pain with passive stretch of the sciatic nerve and a positive straight leg raise. Specialty Comments:  No specialty comments available.  Imaging: No results found. Previous plain films of lumbar spine were reviewed again and shows degenerative disc disease at several levels with spondylosis.  PMFS History: Patient Active Problem List   Diagnosis Date Noted  . Acute pain of left shoulder 10/15/2016  . Pain in right leg 10/15/2016  . Weakness of right leg 10/15/2016  . Cardiomyopathy (HCC) 01/25/2016  . Chronic renal disease, stage III 12/13/2015  . Abnormal echocardiogram 12/13/2015  . Syncope 12/12/2015  . Tobacco use disorder 09/07/2015  . Gout 01/27/2012  . Cocaine use 12/22/2011  . 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 Essential hypertension 04/14/2007  . Asthma 04/14/2007  . COPD (chronic obstructive pulmonary disease) (HCC) 04/14/2007  . Seizures (HCC) 04/14/2007  . PANCREATITIS, HX OF 04/14/2007   Past Medical History:  Diagnosis Date  . Arthritis    "left arm/shoulder; both legs" (12/12/2015)  . ASTHMA    since childhood  . Cardiomyopathy (HCC) 01/25/2016   Echo  12/13/15 - Mild LVH, EF 45-50%, normal wall motion, grade 1 diastolic dysfunction, mild LAE, mild RVE, mild RAE  . COPD 04/14/2007  . Coronary artery disease   . DIABETES MELLITUS, TYPE II 04/14/2007  . GERD (gastroesophageal reflux disease)   . GI bleed   . Gout   . High cholesterol   . History of nuclear stress test    a.  Myoview 6/17: EF 54%, no ischemia  . HYPERTENSION 04/14/2007  . LEG PAIN, RIGHT 05/19/2008  . LOW BACK PAIN 04/14/2007  . Myocardial infarction    ~  2001/notes 09/13/2001 (03/22/2013)  . PANCREATITIS, HX OF 04/14/2007  . Pneumonia    "twice" (12/12/2015)  . SEIZURE DISORDER 04/14/2007   "used to have them when I was young" (03/22/2013)  . Shortness of breath    "related to asthma" (03/22/2013)    Family History  Problem Relation Age of Onset  . Heart disease Sister   . Heart disease Brother   . Heart disease Brother   . Heart disease Brother   . Heart disease Brother     Past Surgical History:  Procedure Laterality Date  . CORONARY ANGIOPLASTY WITH STENT PLACEMENT    . ESOPHAGOGASTRODUODENOSCOPY  12/21/2011   Procedure: ESOPHAGOGASTRODUODENOSCOPY (EGD);  Surgeon: Louis Meckelobert D Kaplan, MD;  [facility];  Service: Endoscopy;  Laterality: N/A;   Social History   Occupational History  . Not on file.   Social History Main Topics  . Smoking status: Former Smoker    Packs/day: 0.25    Years: 57.00    Types: Cigarettes    Quit date: 11/24/2015  . Smokeless tobacco: Never Used  . Alcohol use Yes     Comment: 12/12/2015 "quit drinking years ago; never had problem w/it"  . Drug use: No  . Sexual activity: No

## 2017-04-24 ENCOUNTER — Other Ambulatory Visit (INDEPENDENT_AMBULATORY_CARE_PROVIDER_SITE_OTHER): Payer: Self-pay

## 2017-04-24 DIAGNOSIS — M4807 Spinal stenosis, lumbosacral region: Secondary | ICD-10-CM

## 2017-05-12 ENCOUNTER — Ambulatory Visit (INDEPENDENT_AMBULATORY_CARE_PROVIDER_SITE_OTHER): Payer: Medicare Other | Admitting: Orthopaedic Surgery

## 2017-05-23 ENCOUNTER — Ambulatory Visit
Admission: RE | Admit: 2017-05-23 | Discharge: 2017-05-23 | Disposition: A | Discharge status: Home / Self Care | Payer: Medicare Other | Source: Ambulatory Visit | Attending: Orthopaedic Surgery | Admitting: Orthopaedic Surgery

## 2017-05-23 DIAGNOSIS — M5126 Other intervertebral disc displacement, lumbar region: Secondary | ICD-10-CM | POA: Diagnosis not present

## 2017-05-23 DIAGNOSIS — M4807 Spinal stenosis, lumbosacral region: Secondary | ICD-10-CM

## 2017-05-26 ENCOUNTER — Other Ambulatory Visit (INDEPENDENT_AMBULATORY_CARE_PROVIDER_SITE_OTHER): Payer: Self-pay

## 2017-05-26 ENCOUNTER — Ambulatory Visit (INDEPENDENT_AMBULATORY_CARE_PROVIDER_SITE_OTHER): Payer: Medicare Other | Admitting: Physician Assistant

## 2017-05-26 DIAGNOSIS — G8929 Other chronic pain: Secondary | ICD-10-CM | POA: Diagnosis not present

## 2017-05-26 DIAGNOSIS — M5440 Lumbago with sciatica, unspecified side: Secondary | ICD-10-CM | POA: Diagnosis not present

## 2017-05-26 DIAGNOSIS — M5441 Lumbago with sciatica, right side: Principal | ICD-10-CM

## 2017-05-26 NOTE — Progress Notes (Signed)
Office Visit Note   Patient: Luis Carter           Date of Birth: 1945/09/26           MRN: [id number] Visit Date: 05/26/2017              Requested by: Gordy Savers, MD [address] [place], [place] [address] PCP: Fleet Contras, MD   Assessment & Plan: Visit Diagnoses:  1. Chronic low back pain with sciatica, sciatica laterality unspecified, unspecified back pain laterality     Plan: We'll have him undergo epidural steroid injection/transforaminal injection with Dr. Alvester Morin. He'll continue his Mobic and Neurontin in the interim. Follow with [place] in 1 month check his response to the injection.  Follow-Up Instructions: Return in about 4 weeks (around 06/23/2017).   Orders:  No orders of the defined types were placed in this encounter.  No orders of the defined types were placed in this encounter.     Procedures: No procedures performed   Clinical Data: No additional findings.   Subjective: Low back pain  HPI Luis Carter is a 71 year old male who returns today to go over his MRI his lumbar spine. Continues to have constant pain on the right side of his back and radiates down the leg into his foot. He states pain occasionally occurs on the left fingers down to the anterior lower leg. He describes pain in both legs is achy pain is right leg though has a tingling component that does go down into the foot. Pain keeps him awake at night. He is diabetic states is well controlled. He is on Neurontin. Takes no buttock also feel these helps slightly. MRI lumbar spine dated 05/23/2017 showed L1 to mild/moderate spinal stenosis. Mild narrowing lateral recess of foramina left greater than right. L2-3 borderline spinal stenosis and mild inferior foraminal narrowing bilaterally. No nerve encroachment. L3-4 mild spinal stenosis with mild asymmetric narrowing right lateral recess. Mild foraminal narrowing bilaterally unchanged from prior exam 2012. L4-5 with chronic severe  right foraminal narrowing possible right L4 nerve root encroachment. L5-S1 severe foraminal narrowing bilaterally with bilateral L5 nerve root encroachment. The encroachment L5 nerve roots was seen on previous study in 2012. Results reviewed with patient used a spine model for visualization. Review of Systems Denies any bowel or bladder dysfunction. Positive for waking back pain. Otherwise please see history of present illness  Objective: Vital Signs: There were no vitals taken for this visit.  Physical Exam  Constitutional: He is oriented to person, place, and time. He appears well-developed and well-nourished. No distress.  Neurological: He is alert and oriented to person, place, and time.  Skin: He is not diaphoretic.  Psychiatric: He has a normal mood and affect.    Ortho Exam Positive straight leg layers on the right negative on the left. Specialty Comments:  No specialty comments available.  Imaging: No results found.   PMFS History: Patient Active Problem List   Diagnosis Date Noted  . Acute pain of left shoulder 10/15/2016  . Pain in right leg 10/15/2016  . Weakness of right leg 10/15/2016  . Cardiomyopathy (HCC) 01/25/2016  . Chronic renal disease, stage III (HCC) 12/13/2015  . Abnormal echocardiogram 12/13/2015  . Syncope 12/12/2015  . Tobacco use disorder 09/07/2015  . Gout 01/27/2012  . Cocaine use 12/22/2011  . 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 Essential hypertension 04/14/2007  . Asthma 04/14/2007  . COPD (chronic obstructive pulmonary disease) (HCC) 04/14/2007  .  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 Seizures (HCC) 04/14/2007  . PANCREATITIS, HX OF 04/14/2007   Past Medical History:  Diagnosis Date  . Arthritis    "left arm/shoulder; both legs" (12/12/2015)  . ASTHMA    since childhood  . Cardiomyopathy (HCC) 01/25/2016   Echo 12/13/15 - Mild LVH, EF 45-50%, normal wall motion, grade 1 diastolic dysfunction, mild  LAE, mild RVE, mild RAE  . COPD 04/14/2007  . Coronary artery disease   . DIABETES MELLITUS, TYPE II 04/14/2007  . GERD (gastroesophageal reflux disease)   . GI bleed   . Gout   . High cholesterol   . History of nuclear stress test    a.  Myoview 6/17: EF 54%, no ischemia  . HYPERTENSION 04/14/2007  . LEG PAIN, RIGHT 05/19/2008  . LOW BACK PAIN 04/14/2007  . Myocardial infarction    ~ 2001/notes 09/13/2001 (03/22/2013)  . PANCREATITIS, HX OF 04/14/2007  . Pneumonia    "twice" (12/12/2015)  . SEIZURE DISORDER 04/14/2007   "used to have them when I was young" (03/22/2013)  . Shortness of breath    "related to asthma" (03/22/2013)    Family History  Problem Relation Age of Onset  . Heart disease Sister   . Heart disease Brother   . Heart disease Brother   . Heart disease Brother   . Heart disease Brother     Past Surgical History:  Procedure Laterality Date  . CORONARY ANGIOPLASTY WITH STENT PLACEMENT    . ESOPHAGOGASTRODUODENOSCOPY  12/21/2011   Procedure: ESOPHAGOGASTRODUODENOSCOPY (EGD);  Surgeon: Louis Meckel, MD;  Location: [facility] ENDOSCOPY;  Service: Endoscopy;  Laterality: N/A;   Social History   Occupational History  . Not on file.   Social History Main Topics  . Smoking status: Former Smoker    Packs/day: 0.25    Years: 57.00    Types: Cigarettes    Quit date: 11/24/2015  . Smokeless tobacco: Never Used  . Alcohol use Yes     Comment: 12/12/2015 "quit drinking years ago; never had problem w/it"  . Drug use: No  . Sexual activity: No

## 2017-06-11 ENCOUNTER — Ambulatory Visit (INDEPENDENT_AMBULATORY_CARE_PROVIDER_SITE_OTHER): Payer: Medicare Other

## 2017-06-11 ENCOUNTER — Ambulatory Visit (INDEPENDENT_AMBULATORY_CARE_PROVIDER_SITE_OTHER): Payer: Medicare Other | Admitting: Physical Medicine and Rehabilitation

## 2017-06-11 ENCOUNTER — Encounter (INDEPENDENT_AMBULATORY_CARE_PROVIDER_SITE_OTHER): Payer: Self-pay | Admitting: Physical Medicine and Rehabilitation

## 2017-06-11 VITALS — BP 160/77 | HR 41 | Temp 97.4°F

## 2017-06-11 DIAGNOSIS — M5416 Radiculopathy, lumbar region: Secondary | ICD-10-CM | POA: Diagnosis not present

## 2017-06-11 DIAGNOSIS — M48061 Spinal stenosis, lumbar region without neurogenic claudication: Secondary | ICD-10-CM | POA: Diagnosis not present

## 2017-06-11 MED ORDER — BETAMETHASONE SOD PHOS & ACET 6 (3-3) MG/ML IJ SUSP
12.0000 mg | Freq: Once | INTRAMUSCULAR | Status: AC
  Administered 2017-06-11: 12 mg

## 2017-06-11 MED ORDER — LIDOCAINE HCL (PF) 1 % IJ SOLN
2.0000 mL | Freq: Once | INTRAMUSCULAR | Status: AC
  Administered 2017-06-11: 2 mL

## 2017-06-11 NOTE — Patient Instructions (Signed)

## 2017-06-11 NOTE — Progress Notes (Deleted)
Lower back pain with radiating right leg pain and numbness.  Pain for several years, no injury.  Worse pain with laying down, difficulty walking due to pain.  Tramadol, meloxicam, flexeril, gabapentin  No contrast allergies. Takes Asprin. Has driver.

## 2017-06-24 DIAGNOSIS — E1142 Type 2 diabetes mellitus with diabetic polyneuropathy: Secondary | ICD-10-CM | POA: Diagnosis not present

## 2017-06-24 DIAGNOSIS — I10 Essential (primary) hypertension: Secondary | ICD-10-CM | POA: Diagnosis not present

## 2017-06-24 DIAGNOSIS — J449 Chronic obstructive pulmonary disease, unspecified: Secondary | ICD-10-CM | POA: Diagnosis not present

## 2017-06-24 DIAGNOSIS — M5136 Other intervertebral disc degeneration, lumbar region: Secondary | ICD-10-CM | POA: Diagnosis not present

## 2017-06-24 DIAGNOSIS — M1A9XX0 Chronic gout, unspecified, without tophus (tophi): Secondary | ICD-10-CM | POA: Diagnosis not present

## 2017-06-24 NOTE — Procedures (Signed)
[name] is a 71 year old gentleman with a history of chronic back pain for many years now with pain radiating down the right leg with numbness and tingling in a pretty classic L5 distribution.  He has been followed by Dr. Magnus IvanBlackman with conservative care including tramadol and meloxicam and Flexeril and gabapentin.  MRI was completed recently that does show foraminal stenosis at both L4 and L5 on the right.  He did not have much of the way of central stenosis.  We will complete a diagnostic L5 transforaminal injection on the right.  Depending on his relief would look at an L4 transforaminal injection or both at L4 and L5.  He will continue with his current medications.  The injection  will be diagnostic and hopefully therapeutic. The patient has failed conservative care including time, medications and activity modification.  Lumbosacral Transforaminal Epidural Steroid Injection - Sub-Pedicular Approach with Fluoroscopic Guidance  Patient: Luis Carter      Date of Birth: 06/08/1946 MRN: [id number] PCP: Fleet ContrasAvbuere, Edwin, MD      Visit Date: 06/11/2017   Universal Protocol:    Date/Time: 06/11/2017  Consent Given By: the patient  Position: PRONE  Additional Comments: Vital signs were monitored before and after the procedure. Patient was prepped and draped in the usual sterile fashion. The correct patient, procedure, and site was verified.   Injection Procedure Details:  Procedure Site One Meds Administered:  Meds ordered this encounter  Medications  . lidocaine (PF) (XYLOCAINE) 1 % injection 2 mL  . betamethasone acetate-betamethasone sodium phosphate (CELESTONE) injection 12 mg    Laterality: Right  Location/Site:  L5-S1  Needle size: 22 G  Needle type: Spinal  Needle Placement: Transforaminal  Findings:  -Contrast Used: 1 mL iohexol 180 mg iodine/mL   -Comments: Excellent flow of contrast along the nerve and into the epidural space.  Procedure Details: After  squaring off the end-plates to get a true AP view, the C-arm was positioned so that an oblique view of the foramen as noted above was visualized. The target area is just inferior to the "nose of the scotty dog" or sub pedicular. The soft tissues overlying this structure were infiltrated with 2-3 ml. of 1% Lidocaine without Epinephrine.  The spinal needle was inserted toward the target using a "trajectory" view along the fluoroscope beam.  Under AP and lateral visualization, the needle was advanced so it did not puncture dura and was located close the 6 O'Clock position of the pedical in AP tracterory. Biplanar projections were used to confirm position. Aspiration was confirmed to be negative for CSF and/or blood. A 1-2 ml. volume of Isovue-250 was injected and flow of contrast was noted at each level. Radiographs were obtained for documentation purposes.   After attaining the desired flow of contrast documented above, a 0.5 to 1.0 ml test dose of 0.25% Marcaine was injected into each respective transforaminal space.  The patient was observed for 90 seconds post injection.  After no sensory deficits were reported, and normal lower extremity motor function was noted,   the above injectate was administered so that equal amounts of the injectate were placed at each foramen (level) into the transforaminal epidural space.   Additional Comments:  The patient tolerated the procedure well Dressing: Band-Aid    Post-procedure details: Patient was observed during the procedure. Post-procedure instructions were reviewed.  Patient left the clinic in stable condition.

## 2017-07-14 ENCOUNTER — Encounter (INDEPENDENT_AMBULATORY_CARE_PROVIDER_SITE_OTHER): Payer: Self-pay | Admitting: Orthopaedic Surgery

## 2017-07-14 ENCOUNTER — Other Ambulatory Visit (INDEPENDENT_AMBULATORY_CARE_PROVIDER_SITE_OTHER): Payer: Self-pay

## 2017-07-14 ENCOUNTER — Ambulatory Visit (INDEPENDENT_AMBULATORY_CARE_PROVIDER_SITE_OTHER): Payer: Medicare Other | Admitting: Physician Assistant

## 2017-07-14 DIAGNOSIS — G8929 Other chronic pain: Secondary | ICD-10-CM

## 2017-07-14 DIAGNOSIS — M5416 Radiculopathy, lumbar region: Secondary | ICD-10-CM

## 2017-07-14 DIAGNOSIS — M5441 Lumbago with sciatica, right side: Principal | ICD-10-CM

## 2017-07-14 NOTE — Progress Notes (Signed)
[name] returns today for follow-up of his low back pain with radicular symptoms down mainly the right leg.  He underwent transforaminal injection at L5-S1 on the right by Dr. Alvester MorinNewton on 06/11/2017 states he has pain that goes from his right thigh inner aspect to the bottom of his right foot.  States that the injection really helped with his left leg pain which he states used to be the problem now his problem is right leg pain.  Notes documents that he had right leg radicular-like symptoms and occasional pain in the left leg.  Again his MRI showed severe right foraminal narrowing at L4-5 and by foraminal narrowing L5-S1.  Review of systems: No bowel or bladder dysfunction.  Physical exam: Positive straight leg raise on the right negative on the left.  In spite of 5 strength throughout lower extremities except for the right foot which he has a foot drop.  He does have some motion through [place]  joint actively.  Impression: Lumbar radiculopathy left leg  Plan: We will send him back to Dr. Alvester MorinNewton  for a transforaminal injection on the right at L4-5.  See him back in a month to check his progress or lack of progress.

## 2017-08-18 ENCOUNTER — Other Ambulatory Visit: Payer: Self-pay | Admitting: Internal Medicine

## 2017-08-26 ENCOUNTER — Ambulatory Visit (INDEPENDENT_AMBULATORY_CARE_PROVIDER_SITE_OTHER): Payer: Medicare Other | Admitting: Physical Medicine and Rehabilitation

## 2017-08-26 ENCOUNTER — Encounter (INDEPENDENT_AMBULATORY_CARE_PROVIDER_SITE_OTHER): Payer: Self-pay | Admitting: Physical Medicine and Rehabilitation

## 2017-08-26 ENCOUNTER — Ambulatory Visit (INDEPENDENT_AMBULATORY_CARE_PROVIDER_SITE_OTHER): Payer: Medicare Other

## 2017-08-26 VITALS — BP 177/83 | HR 55 | Temp 97.9°F

## 2017-08-26 DIAGNOSIS — M48062 Spinal stenosis, lumbar region with neurogenic claudication: Secondary | ICD-10-CM

## 2017-08-26 DIAGNOSIS — M5416 Radiculopathy, lumbar region: Secondary | ICD-10-CM | POA: Diagnosis not present

## 2017-08-26 MED ORDER — BETAMETHASONE SOD PHOS & ACET 6 (3-3) MG/ML IJ SUSP
12.0000 mg | Freq: Once | INTRAMUSCULAR | Status: AC
  Administered 2017-08-26: 12 mg

## 2017-08-26 NOTE — Patient Instructions (Signed)

## 2017-08-26 NOTE — Progress Notes (Deleted)
Pt states pain, numbness, and tingling in right leg that radiates down to the right foot. Pt states nothing makes the pain better, pt also says pain increases just by walking on it. Pt describes pain as constant and throbbing. -Dye Allergy, +Driver, -BT.

## 2017-09-14 NOTE — Procedures (Signed)
[name] is a [age] gentleman with a right radicular pain and stenosis.  We completed a right L5 transforaminal injection about a month or so ago with some relief.  He reports that really nothing else has helped at all.  He describes this as a constant throbbing radiating pain to the right foot.  We are going to complete a right L4 transforaminal injection to see if we get him some relief.  Fortunately may be a surgical candidate and would likely need referral for possible decompression.  The injection  will be diagnostic and hopefully therapeutic. The patient has failed conservative care including time, medications and activity modification.  Lumbosacral Transforaminal Epidural Steroid Injection - Sub-Pedicular Approach with Fluoroscopic Guidance  Patient: Luis Carter      Date of Birth: 11/02/1945 MRN: [id number] PCP: Fleet ContrasAvbuere, Edwin, MD      Visit Date: 08/26/2017   Universal Protocol:    Date/Time: 08/26/2017  Consent Given By: the patient  Position: PRONE  Additional Comments: Vital signs were monitored before and after the procedure. Patient was prepped and draped in the usual sterile fashion. The correct patient, procedure, and site was verified.   Injection Procedure Details:  Procedure Site One Meds Administered:  Meds ordered this encounter  Medications  . betamethasone acetate-betamethasone sodium phosphate (CELESTONE) injection 12 mg    Laterality: Right  Location/Site:  L4-L5  Needle size: 22 G  Needle type: Spinal  Needle Placement: Transforaminal  Findings:    -Comments: Excellent flow of contrast along the nerve and into the epidural space.  Procedure Details: After squaring off the end-plates to get a true AP view, the C-arm was positioned so that an oblique view of the foramen as noted above was visualized. The target area is just inferior to the "nose of the scotty dog" or sub pedicular. The soft tissues overlying this structure were infiltrated  with 2-3 ml. of 1% Lidocaine without Epinephrine.  The spinal needle was inserted toward the target using a "trajectory" view along the fluoroscope beam.  Under AP and lateral visualization, the needle was advanced so it did not puncture dura and was located close the 6 O'Clock position of the pedical in AP tracterory. Biplanar projections were used to confirm position. Aspiration was confirmed to be negative for CSF and/or blood. A 1-2 ml. volume of Isovue-250 was injected and flow of contrast was noted at each level. Radiographs were obtained for documentation purposes.   After attaining the desired flow of contrast documented above, a 0.5 to 1.0 ml test dose of 0.25% Marcaine was injected into each respective transforaminal space.  The patient was observed for 90 seconds post injection.  After no sensory deficits were reported, and normal lower extremity motor function was noted,   the above injectate was administered so that equal amounts of the injectate were placed at each foramen (level) into the transforaminal epidural space.   Additional Comments:  The patient tolerated the procedure well Dressing: Band-Aid    Post-procedure details: Patient was observed during the procedure. Post-procedure instructions were reviewed.  Patient left the clinic in stable condition.   Pertinent Imaging: MRI LUMBAR SPINE WITHOUT CONTRAST  TECHNIQUE: Multiplanar, multisequence MR imaging of the lumbar spine was performed. No intravenous contrast was administered.  COMPARISON:  Lumbar radiographs 08/13/2016.  Lumbar MRI 04/14/2011.  FINDINGS: Segmentation: Conventional anatomy assumed, with the last open disc space designated L5-S1.  Alignment: Stable mild scoliosis and straightening. No focal angulation or listhesis.  Vertebrae: No worrisome osseous lesion,  acute fracture or pars defect. There are new severe endplate degenerative changes at L1-2 which are asymmetric to the left. Chronic  endplate degenerative changes at L3-4 and L4-5 are similar to the previous study. The lumbar pedicles are short on a congenital basis. The visualized sacroiliac joints appear unremarkable.  Conus medullaris: Extends to the L1 level and appears normal.  Paraspinal and other soft tissues: No significant paraspinal findings. Probable right renal cyst, incompletely visualized.  Disc levels:  Sagittal images demonstrate no significant disc space findings at T11-12 or T12-L1.  L1-2: Interval disc and endplate degeneration asymmetric to the left. There is annular disc bulging with moderate facet and ligamentous hypertrophy. These factors contribute to mild-to-moderate spinal stenosis. There is mild narrowing of the lateral recesses and foramina, left-greater-than-right.  L2-3: Similar loss of disc height with annular disc bulging and mild facet hypertrophy. There is borderline spinal stenosis and mild inferior foraminal narrowing bilaterally. No nerve root encroachment.  L3-4: Chronic degenerative disc disease with loss of disc height, annular disc bulging and endplate osteophytes. There is mild facet and ligamentous hypertrophy. There is stable mild spinal stenosis with mild asymmetric narrowing of the right lateral recess. Mild foraminal narrowing bilaterally appears unchanged.  L4-5: Similar chronic degenerative disc disease with loss of disc height, annular disc bulging and endplate osteophytes asymmetric to the right. There is chronic severe right foraminal narrowing with possible chronic right L4 nerve root encroachment. Mild narrowing of the lateral recesses and left foramen is stable.  L5-S1: Similar chronic degenerative disc disease with annular disc bulging and endplate osteophytes. There is severe foraminal narrowing bilaterally with probable chronic bilateral L5 nerve root encroachment, similar to previous study.  IMPRESSION: 1. Interval disc and endplate  degeneration at L1-2 contributing to mild to moderate multifactorial spinal stenosis. There is also mild narrowing of the lateral recesses and foramina, left greater than right. 2. The findings at the additional levels are similar to 2012 study. There is chronic degenerative disc disease and endplate degeneration at the lower 3 levels with chronic severe right foraminal narrowing at L4-5 and biforaminal narrowing at L5-S1.   Electronically Signed   By: Carey Bullocks M.D.   On: 05/23/2017 11:19

## 2017-09-29 ENCOUNTER — Ambulatory Visit (HOSPITAL_COMMUNITY)
Admission: EM | Admit: 2017-09-29 | Discharge: 2017-09-29 | Disposition: A | Discharge status: Home / Self Care | Payer: Medicare Other | Source: Home / Self Care

## 2017-09-29 ENCOUNTER — Other Ambulatory Visit: Payer: Self-pay

## 2017-09-29 ENCOUNTER — Emergency Department (HOSPITAL_COMMUNITY): Payer: Medicare Other

## 2017-09-29 ENCOUNTER — Encounter (HOSPITAL_COMMUNITY): Payer: Self-pay | Admitting: Emergency Medicine

## 2017-09-29 ENCOUNTER — Emergency Department (HOSPITAL_COMMUNITY)
Admission: EM | Admit: 2017-09-29 | Discharge: 2017-09-29 | Discharge status: Against Medical Advice | Payer: Medicare Other

## 2017-09-29 ENCOUNTER — Encounter (HOSPITAL_COMMUNITY): Payer: Self-pay

## 2017-09-29 DIAGNOSIS — R079 Chest pain, unspecified: Secondary | ICD-10-CM

## 2017-09-29 DIAGNOSIS — R0602 Shortness of breath: Secondary | ICD-10-CM

## 2017-09-29 LAB — BASIC METABOLIC PANEL
Anion gap: 12 (ref 5–15)
BUN: 15 mg/dL (ref 6–20)
CHLORIDE: 100 mmol/L — AB (ref 101–111)
CO2: 25 mmol/L (ref 22–32)
Calcium: 8.5 mg/dL — ABNORMAL LOW (ref 8.9–10.3)
Creatinine, Ser: 1.63 mg/dL — ABNORMAL HIGH (ref 0.61–1.24)
GFR, EST AFRICAN AMERICAN: 47 mL/min — AB (ref >60)
GFR, EST NON AFRICAN AMERICAN: 40 mL/min — AB (ref >60)
Glucose, Bld: 119 mg/dL — ABNORMAL HIGH (ref 65–99)
POTASSIUM: 3.9 mmol/L (ref 3.5–5.1)
SODIUM: 137 mmol/L (ref 135–145)

## 2017-09-29 LAB — CBC
HEMATOCRIT: 39.3 % (ref 39.0–52.0)
Hemoglobin: 12.9 g/dL — ABNORMAL LOW (ref 13.0–17.0)
MCH: 28 pg (ref 26.0–34.0)
MCHC: 32.8 g/dL (ref 30.0–36.0)
MCV: 85.4 fL (ref 78.0–100.0)
PLATELETS: 207 10*3/uL (ref 150–400)
RBC: 4.6 MIL/uL (ref 4.22–5.81)
RDW: 16.1 % — ABNORMAL HIGH (ref 11.5–15.5)
WBC: 15.6 10*3/uL — AB (ref 4.0–10.5)

## 2017-09-29 LAB — I-STAT TROPONIN, ED: Troponin i, poc: 0 ng/mL (ref 0.00–0.08)

## 2017-09-29 MED ORDER — NITROGLYCERIN 0.4 MG SL SUBL
SUBLINGUAL_TABLET | SUBLINGUAL | Status: AC
  Filled 2017-09-29: qty 3

## 2017-09-29 MED ORDER — ASPIRIN 81 MG PO CHEW
CHEWABLE_TABLET | ORAL | Status: AC
  Filled 2017-09-29: qty 4

## 2017-09-29 NOTE — ED Triage Notes (Signed)
Onset Sunday of sob complaints and complained of chest pain.  Chest pain is constant, seems to be worse at night.  Sob with activity.  Patient has not been as active for the last couple of days.  Family member reports patient was covered in sweat this evening.   Currently skin is dry.

## 2017-09-29 NOTE — ED Triage Notes (Signed)
Pt states he has been having chest pain for 2 days. Worse with breathing. Pt states pain is 6/10. Skin warm and dry, no distress noted.

## 2017-09-29 NOTE — ED Notes (Signed)
Patient and family member opted to go to ed now.

## 2017-10-07 DIAGNOSIS — N3001 Acute cystitis with hematuria: Secondary | ICD-10-CM | POA: Diagnosis not present

## 2017-10-07 DIAGNOSIS — J209 Acute bronchitis, unspecified: Secondary | ICD-10-CM | POA: Diagnosis not present

## 2017-10-07 DIAGNOSIS — J449 Chronic obstructive pulmonary disease, unspecified: Secondary | ICD-10-CM | POA: Diagnosis not present

## 2017-10-07 DIAGNOSIS — M1A9XX0 Chronic gout, unspecified, without tophus (tophi): Secondary | ICD-10-CM | POA: Diagnosis not present

## 2017-10-07 DIAGNOSIS — E1142 Type 2 diabetes mellitus with diabetic polyneuropathy: Secondary | ICD-10-CM | POA: Diagnosis not present

## 2017-10-07 DIAGNOSIS — I10 Essential (primary) hypertension: Secondary | ICD-10-CM | POA: Diagnosis not present

## 2017-11-06 DIAGNOSIS — E1142 Type 2 diabetes mellitus with diabetic polyneuropathy: Secondary | ICD-10-CM | POA: Diagnosis not present

## 2017-11-06 DIAGNOSIS — M12812 Other specific arthropathies, not elsewhere classified, left shoulder: Secondary | ICD-10-CM | POA: Diagnosis not present

## 2017-11-06 DIAGNOSIS — E7849 Other hyperlipidemia: Secondary | ICD-10-CM | POA: Diagnosis not present

## 2017-11-06 DIAGNOSIS — J449 Chronic obstructive pulmonary disease, unspecified: Secondary | ICD-10-CM | POA: Diagnosis not present

## 2017-11-06 DIAGNOSIS — I10 Essential (primary) hypertension: Secondary | ICD-10-CM | POA: Diagnosis not present

## 2017-12-11 DIAGNOSIS — J449 Chronic obstructive pulmonary disease, unspecified: Secondary | ICD-10-CM | POA: Diagnosis not present

## 2017-12-11 DIAGNOSIS — M12812 Other specific arthropathies, not elsewhere classified, left shoulder: Secondary | ICD-10-CM | POA: Diagnosis not present

## 2017-12-11 DIAGNOSIS — I10 Essential (primary) hypertension: Secondary | ICD-10-CM | POA: Diagnosis not present

## 2017-12-11 DIAGNOSIS — E1142 Type 2 diabetes mellitus with diabetic polyneuropathy: Secondary | ICD-10-CM | POA: Diagnosis not present

## 2018-01-22 DIAGNOSIS — I10 Essential (primary) hypertension: Secondary | ICD-10-CM | POA: Diagnosis not present

## 2018-01-22 DIAGNOSIS — E1142 Type 2 diabetes mellitus with diabetic polyneuropathy: Secondary | ICD-10-CM | POA: Diagnosis not present

## 2018-01-22 DIAGNOSIS — E7849 Other hyperlipidemia: Secondary | ICD-10-CM | POA: Diagnosis not present

## 2018-01-22 DIAGNOSIS — J449 Chronic obstructive pulmonary disease, unspecified: Secondary | ICD-10-CM | POA: Diagnosis not present

## 2018-04-23 DIAGNOSIS — M1A9XX0 Chronic gout, unspecified, without tophus (tophi): Secondary | ICD-10-CM | POA: Diagnosis not present

## 2018-04-23 DIAGNOSIS — I10 Essential (primary) hypertension: Secondary | ICD-10-CM | POA: Diagnosis not present

## 2018-04-23 DIAGNOSIS — J449 Chronic obstructive pulmonary disease, unspecified: Secondary | ICD-10-CM | POA: Diagnosis not present

## 2018-04-23 DIAGNOSIS — E1142 Type 2 diabetes mellitus with diabetic polyneuropathy: Secondary | ICD-10-CM | POA: Diagnosis not present

## 2018-04-23 DIAGNOSIS — E7849 Other hyperlipidemia: Secondary | ICD-10-CM | POA: Diagnosis not present

## 2018-08-09 DIAGNOSIS — I10 Essential (primary) hypertension: Secondary | ICD-10-CM | POA: Diagnosis not present

## 2018-08-09 DIAGNOSIS — E1142 Type 2 diabetes mellitus with diabetic polyneuropathy: Secondary | ICD-10-CM | POA: Diagnosis not present

## 2018-08-09 DIAGNOSIS — J449 Chronic obstructive pulmonary disease, unspecified: Secondary | ICD-10-CM | POA: Diagnosis not present

## 2018-08-09 DIAGNOSIS — M5136 Other intervertebral disc degeneration, lumbar region: Secondary | ICD-10-CM | POA: Diagnosis not present

## 2018-10-11 ENCOUNTER — Encounter (HOSPITAL_COMMUNITY): Payer: Self-pay

## 2018-10-11 ENCOUNTER — Other Ambulatory Visit: Payer: Self-pay

## 2018-10-11 ENCOUNTER — Inpatient Hospital Stay (HOSPITAL_COMMUNITY)
Admission: EM | Admit: 2018-10-11 | Discharge: 2018-10-14 | DRG: 871 | Disposition: A | Discharge status: Home / Self Care | Payer: Medicare Other | Attending: Internal Medicine | Admitting: Internal Medicine

## 2018-10-11 ENCOUNTER — Emergency Department (HOSPITAL_COMMUNITY): Payer: Medicare Other

## 2018-10-11 DIAGNOSIS — Z7982 Long term (current) use of aspirin: Secondary | ICD-10-CM

## 2018-10-11 DIAGNOSIS — Z791 Long term (current) use of non-steroidal anti-inflammatories (NSAID): Secondary | ICD-10-CM

## 2018-10-11 DIAGNOSIS — I499 Cardiac arrhythmia, unspecified: Secondary | ICD-10-CM | POA: Diagnosis not present

## 2018-10-11 DIAGNOSIS — I10 Essential (primary) hypertension: Secondary | ICD-10-CM | POA: Diagnosis not present

## 2018-10-11 DIAGNOSIS — I2510 Atherosclerotic heart disease of native coronary artery without angina pectoris: Secondary | ICD-10-CM | POA: Diagnosis not present

## 2018-10-11 DIAGNOSIS — J189 Pneumonia, unspecified organism: Secondary | ICD-10-CM

## 2018-10-11 DIAGNOSIS — I252 Old myocardial infarction: Secondary | ICD-10-CM | POA: Diagnosis not present

## 2018-10-11 DIAGNOSIS — F17200 Nicotine dependence, unspecified, uncomplicated: Secondary | ICD-10-CM | POA: Diagnosis present

## 2018-10-11 DIAGNOSIS — J9602 Acute respiratory failure with hypercapnia: Secondary | ICD-10-CM | POA: Diagnosis not present

## 2018-10-11 DIAGNOSIS — Y92230 Patient room in hospital as the place of occurrence of the external cause: Secondary | ICD-10-CM | POA: Diagnosis not present

## 2018-10-11 DIAGNOSIS — A4189 Other specified sepsis: Secondary | ICD-10-CM | POA: Diagnosis not present

## 2018-10-11 DIAGNOSIS — Z7984 Long term (current) use of oral hypoglycemic drugs: Secondary | ICD-10-CM

## 2018-10-11 DIAGNOSIS — I248 Other forms of acute ischemic heart disease: Secondary | ICD-10-CM | POA: Diagnosis not present

## 2018-10-11 DIAGNOSIS — J9691 Respiratory failure, unspecified with hypoxia: Secondary | ICD-10-CM | POA: Diagnosis present

## 2018-10-11 DIAGNOSIS — Z23 Encounter for immunization: Secondary | ICD-10-CM

## 2018-10-11 DIAGNOSIS — J181 Lobar pneumonia, unspecified organism: Secondary | ICD-10-CM

## 2018-10-11 DIAGNOSIS — Z7951 Long term (current) use of inhaled steroids: Secondary | ICD-10-CM

## 2018-10-11 DIAGNOSIS — M5440 Lumbago with sciatica, unspecified side: Secondary | ICD-10-CM | POA: Diagnosis present

## 2018-10-11 DIAGNOSIS — R6520 Severe sepsis without septic shock: Secondary | ICD-10-CM | POA: Diagnosis not present

## 2018-10-11 DIAGNOSIS — N183 Chronic kidney disease, stage 3 (moderate): Secondary | ICD-10-CM | POA: Diagnosis not present

## 2018-10-11 DIAGNOSIS — K219 Gastro-esophageal reflux disease without esophagitis: Secondary | ICD-10-CM | POA: Diagnosis present

## 2018-10-11 DIAGNOSIS — J9601 Acute respiratory failure with hypoxia: Secondary | ICD-10-CM | POA: Diagnosis not present

## 2018-10-11 DIAGNOSIS — T380X5A Adverse effect of glucocorticoids and synthetic analogues, initial encounter: Secondary | ICD-10-CM | POA: Diagnosis not present

## 2018-10-11 DIAGNOSIS — M109 Gout, unspecified: Secondary | ICD-10-CM | POA: Diagnosis not present

## 2018-10-11 DIAGNOSIS — Z955 Presence of coronary angioplasty implant and graft: Secondary | ICD-10-CM

## 2018-10-11 DIAGNOSIS — J441 Chronic obstructive pulmonary disease with (acute) exacerbation: Secondary | ICD-10-CM | POA: Diagnosis not present

## 2018-10-11 DIAGNOSIS — R05 Cough: Secondary | ICD-10-CM | POA: Diagnosis not present

## 2018-10-11 DIAGNOSIS — R062 Wheezing: Secondary | ICD-10-CM | POA: Diagnosis not present

## 2018-10-11 DIAGNOSIS — G8929 Other chronic pain: Secondary | ICD-10-CM | POA: Diagnosis present

## 2018-10-11 DIAGNOSIS — E7800 Pure hypercholesterolemia, unspecified: Secondary | ICD-10-CM | POA: Diagnosis not present

## 2018-10-11 DIAGNOSIS — I493 Ventricular premature depolarization: Secondary | ICD-10-CM | POA: Diagnosis not present

## 2018-10-11 DIAGNOSIS — E1122 Type 2 diabetes mellitus with diabetic chronic kidney disease: Secondary | ICD-10-CM | POA: Diagnosis present

## 2018-10-11 DIAGNOSIS — J440 Chronic obstructive pulmonary disease with acute lower respiratory infection: Secondary | ICD-10-CM | POA: Diagnosis present

## 2018-10-11 DIAGNOSIS — A403 Sepsis due to Streptococcus pneumoniae: Secondary | ICD-10-CM | POA: Diagnosis not present

## 2018-10-11 DIAGNOSIS — J449 Chronic obstructive pulmonary disease, unspecified: Secondary | ICD-10-CM | POA: Diagnosis present

## 2018-10-11 DIAGNOSIS — J13 Pneumonia due to Streptococcus pneumoniae: Secondary | ICD-10-CM | POA: Diagnosis not present

## 2018-10-11 DIAGNOSIS — J160 Chlamydial pneumonia: Secondary | ICD-10-CM

## 2018-10-11 DIAGNOSIS — I130 Hypertensive heart and chronic kidney disease with heart failure and stage 1 through stage 4 chronic kidney disease, or unspecified chronic kidney disease: Secondary | ICD-10-CM | POA: Diagnosis present

## 2018-10-11 DIAGNOSIS — J690 Pneumonitis due to inhalation of food and vomit: Secondary | ICD-10-CM | POA: Diagnosis not present

## 2018-10-11 DIAGNOSIS — I5032 Chronic diastolic (congestive) heart failure: Secondary | ICD-10-CM | POA: Diagnosis present

## 2018-10-11 DIAGNOSIS — Z79899 Other long term (current) drug therapy: Secondary | ICD-10-CM

## 2018-10-11 DIAGNOSIS — I429 Cardiomyopathy, unspecified: Secondary | ICD-10-CM | POA: Diagnosis not present

## 2018-10-11 DIAGNOSIS — N179 Acute kidney failure, unspecified: Secondary | ICD-10-CM | POA: Diagnosis not present

## 2018-10-11 DIAGNOSIS — A419 Sepsis, unspecified organism: Secondary | ICD-10-CM

## 2018-10-11 DIAGNOSIS — Z8249 Family history of ischemic heart disease and other diseases of the circulatory system: Secondary | ICD-10-CM

## 2018-10-11 DIAGNOSIS — E0821 Diabetes mellitus due to underlying condition with diabetic nephropathy: Secondary | ICD-10-CM | POA: Diagnosis not present

## 2018-10-11 DIAGNOSIS — Z87891 Personal history of nicotine dependence: Secondary | ICD-10-CM

## 2018-10-11 DIAGNOSIS — J9621 Acute and chronic respiratory failure with hypoxia: Secondary | ICD-10-CM | POA: Diagnosis not present

## 2018-10-11 DIAGNOSIS — R0689 Other abnormalities of breathing: Secondary | ICD-10-CM | POA: Diagnosis not present

## 2018-10-11 DIAGNOSIS — Z792 Long term (current) use of antibiotics: Secondary | ICD-10-CM

## 2018-10-11 DIAGNOSIS — D72829 Elevated white blood cell count, unspecified: Secondary | ICD-10-CM | POA: Diagnosis not present

## 2018-10-11 DIAGNOSIS — M25552 Pain in left hip: Secondary | ICD-10-CM | POA: Diagnosis not present

## 2018-10-11 DIAGNOSIS — Z79891 Long term (current) use of opiate analgesic: Secondary | ICD-10-CM

## 2018-10-11 DIAGNOSIS — E785 Hyperlipidemia, unspecified: Secondary | ICD-10-CM | POA: Diagnosis present

## 2018-10-11 DIAGNOSIS — N189 Chronic kidney disease, unspecified: Secondary | ICD-10-CM

## 2018-10-11 DIAGNOSIS — R0602 Shortness of breath: Secondary | ICD-10-CM | POA: Diagnosis not present

## 2018-10-11 DIAGNOSIS — R000 Tachycardia, unspecified: Secondary | ICD-10-CM | POA: Diagnosis not present

## 2018-10-11 DIAGNOSIS — E1129 Type 2 diabetes mellitus with other diabetic kidney complication: Secondary | ICD-10-CM | POA: Diagnosis present

## 2018-10-11 DIAGNOSIS — J9692 Respiratory failure, unspecified with hypercapnia: Secondary | ICD-10-CM

## 2018-10-11 LAB — COMPREHENSIVE METABOLIC PANEL
ALBUMIN: 3.5 g/dL (ref 3.5–5.0)
ALT: 13 U/L (ref 0–44)
AST: 31 U/L (ref 15–41)
Alkaline Phosphatase: 88 U/L (ref 38–126)
Anion gap: 14 (ref 5–15)
BILIRUBIN TOTAL: 1.2 mg/dL (ref 0.3–1.2)
BUN: 22 mg/dL (ref 8–23)
CHLORIDE: 103 mmol/L (ref 98–111)
CO2: 22 mmol/L (ref 22–32)
CREATININE: 2.57 mg/dL — AB (ref 0.61–1.24)
Calcium: 8.8 mg/dL — ABNORMAL LOW (ref 8.9–10.3)
GFR calc Af Amer: 27 mL/min — ABNORMAL LOW (ref >60)
GFR calc non Af Amer: 24 mL/min — ABNORMAL LOW (ref >60)
GLUCOSE: 134 mg/dL — AB (ref 70–99)
Potassium: 3.9 mmol/L (ref 3.5–5.1)
Sodium: 139 mmol/L (ref 135–145)
TOTAL PROTEIN: 7 g/dL (ref 6.5–8.1)

## 2018-10-11 LAB — MRSA PCR SCREENING: MRSA by PCR: NEGATIVE

## 2018-10-11 LAB — POCT I-STAT EG7
ACID-BASE DEFICIT: 5 mmol/L — AB (ref 0.0–2.0)
Acid-base deficit: 3 mmol/L — ABNORMAL HIGH (ref 0.0–2.0)
Bicarbonate: 23.9 mmol/L (ref 20.0–28.0)
Bicarbonate: 22.9 mmol/L (ref 20.0–28.0)
Calcium, Ion: 1.1 mmol/L — ABNORMAL LOW (ref 1.15–1.40)
Calcium, Ion: 1 mmol/L — ABNORMAL LOW (ref 1.15–1.40)
HCT: 40 % (ref 39.0–52.0)
HCT: 35 % — ABNORMAL LOW (ref 39.0–52.0)
Hemoglobin: 13.6 g/dL (ref 13.0–17.0)
Hemoglobin: 11.9 g/dL — ABNORMAL LOW (ref 13.0–17.0)
O2 Saturation: 25 %
O2 Saturation: 30 %
Potassium: 3.5 mmol/L (ref 3.5–5.1)
Potassium: 4 mmol/L (ref 3.5–5.1)
Sodium: 139 mmol/L (ref 135–145)
Sodium: 140 mmol/L (ref 135–145)
TCO2: 25 mmol/L (ref 22–32)
TCO2: 25 mmol/L (ref 22–32)
pCO2, Ven: 50.3 mmHg (ref 44.0–60.0)
pCO2, Ven: 53.3 mmHg (ref 44.0–60.0)
pH, Ven: 7.286 (ref 7.250–7.430)
pH, Ven: 7.242 — ABNORMAL LOW (ref 7.250–7.430)
pO2, Ven: 19 mmHg — CL (ref 32.0–45.0)
pO2, Ven: 23 mmHg — CL (ref 32.0–45.0)

## 2018-10-11 LAB — GLUCOSE, CAPILLARY
Glucose-Capillary: 128 mg/dL — ABNORMAL HIGH (ref 70–99)
Glucose-Capillary: 162 mg/dL — ABNORMAL HIGH (ref 70–99)

## 2018-10-11 LAB — CBC WITH DIFFERENTIAL/PLATELET
ABS IMMATURE GRANULOCYTES: 0.09 10*3/uL — AB (ref 0.00–0.07)
Basophils Absolute: 0 10*3/uL (ref 0.0–0.1)
Basophils Relative: 0 %
EOS PCT: 0 %
Eosinophils Absolute: 0 10*3/uL (ref 0.0–0.5)
HEMATOCRIT: 42.7 % (ref 39.0–52.0)
Hemoglobin: 13.4 g/dL (ref 13.0–17.0)
IMMATURE GRANULOCYTES: 1 %
LYMPHS ABS: 1.3 10*3/uL (ref 0.7–4.0)
Lymphocytes Relative: 9 %
MCH: 27.6 pg (ref 26.0–34.0)
MCHC: 31.4 g/dL (ref 30.0–36.0)
MCV: 88 fL (ref 80.0–100.0)
MONO ABS: 0.6 10*3/uL (ref 0.1–1.0)
MONOS PCT: 4 %
NEUTROS ABS: 13.7 10*3/uL — AB (ref 1.7–7.7)
Neutrophils Relative %: 86 %
Platelets: 279 10*3/uL (ref 150–400)
RBC: 4.85 MIL/uL (ref 4.22–5.81)
RDW: 15.1 % (ref 11.5–15.5)
WBC Morphology: INCREASED
WBC: 15.7 10*3/uL — ABNORMAL HIGH (ref 4.0–10.5)
nRBC: 0 % (ref 0.0–0.2)

## 2018-10-11 LAB — LACTIC ACID, PLASMA
Lactic Acid, Venous: 4.3 mmol/L (ref 0.5–1.9)
Lactic Acid, Venous: 3 mmol/L (ref 0.5–1.9)
Lactic Acid, Venous: 3.6 mmol/L (ref 0.5–1.9)

## 2018-10-11 LAB — TROPONIN I
TROPONIN I: 0.06 ng/mL — AB (ref <0.03)
Troponin I: 0.03 ng/mL (ref <0.03)
Troponin I: 0.04 ng/mL (ref <0.03)

## 2018-10-11 LAB — BRAIN NATRIURETIC PEPTIDE: B Natriuretic Peptide: 200.8 pg/mL — ABNORMAL HIGH (ref 0.0–100.0)

## 2018-10-11 LAB — INFLUENZA PANEL BY PCR (TYPE A & B)
INFLAPCR: NEGATIVE
INFLBPCR: NEGATIVE

## 2018-10-11 MED ORDER — SODIUM CHLORIDE 0.9 % IV SOLN
1.0000 g | INTRAVENOUS | Status: DC
  Administered 2018-10-12 – 2018-10-14 (×3): 1 g via INTRAVENOUS
  Filled 2018-10-11 (×3): qty 10

## 2018-10-11 MED ORDER — CALCIUM GLUCONATE-NACL 1-0.675 GM/50ML-% IV SOLN
1.0000 g | Freq: Once | INTRAVENOUS | Status: AC
  Administered 2018-10-11: 1000 mg via INTRAVENOUS
  Filled 2018-10-11: qty 50

## 2018-10-11 MED ORDER — ENOXAPARIN SODIUM 30 MG/0.3ML ~~LOC~~ SOLN
30.0000 mg | SUBCUTANEOUS | Status: DC
  Administered 2018-10-11 – 2018-10-12 (×2): 30 mg via SUBCUTANEOUS
  Filled 2018-10-11 (×2): qty 0.3

## 2018-10-11 MED ORDER — SODIUM CHLORIDE 0.9 % IV BOLUS
1000.0000 mL | Freq: Once | INTRAVENOUS | Status: AC
  Administered 2018-10-11: 1000 mL via INTRAVENOUS

## 2018-10-11 MED ORDER — CYCLOBENZAPRINE HCL 5 MG PO TABS
7.5000 mg | ORAL_TABLET | Freq: Once | ORAL | Status: AC
  Administered 2018-10-11: 7.5 mg via ORAL
  Filled 2018-10-11: qty 1.5

## 2018-10-11 MED ORDER — METHYLPREDNISOLONE SODIUM SUCC 125 MG IJ SOLR
125.0000 mg | Freq: Once | INTRAMUSCULAR | Status: AC
  Administered 2018-10-11: 125 mg via INTRAVENOUS
  Filled 2018-10-11: qty 2

## 2018-10-11 MED ORDER — PANTOPRAZOLE SODIUM 40 MG PO TBEC
40.0000 mg | DELAYED_RELEASE_TABLET | Freq: Every day | ORAL | Status: DC
  Administered 2018-10-12 – 2018-10-14 (×3): 40 mg via ORAL
  Filled 2018-10-11 (×4): qty 1

## 2018-10-11 MED ORDER — INSULIN ASPART 100 UNIT/ML ~~LOC~~ SOLN
0.0000 [IU] | Freq: Three times a day (TID) | SUBCUTANEOUS | Status: DC
  Administered 2018-10-11: 1 [IU] via SUBCUTANEOUS
  Administered 2018-10-12: 2 [IU] via SUBCUTANEOUS
  Administered 2018-10-12: 1 [IU] via SUBCUTANEOUS
  Administered 2018-10-13: 2 [IU] via SUBCUTANEOUS
  Administered 2018-10-13: 5 [IU] via SUBCUTANEOUS
  Administered 2018-10-13: 2 [IU] via SUBCUTANEOUS
  Administered 2018-10-14: 3 [IU] via SUBCUTANEOUS

## 2018-10-11 MED ORDER — ALBUTEROL SULFATE (2.5 MG/3ML) 0.083% IN NEBU: 2.5000 mg | INHALATION_SOLUTION | RESPIRATORY_TRACT | Status: DC | PRN

## 2018-10-11 MED ORDER — SODIUM CHLORIDE 0.9 % IV BOLUS
500.0000 mL | Freq: Once | INTRAVENOUS | Status: AC
  Administered 2018-10-11: 500 mL via INTRAVENOUS

## 2018-10-11 MED ORDER — IPRATROPIUM-ALBUTEROL 0.5-2.5 (3) MG/3ML IN SOLN
3.0000 mL | Freq: Once | RESPIRATORY_TRACT | Status: AC
  Administered 2018-10-11: 3 mL via RESPIRATORY_TRACT
  Filled 2018-10-11: qty 3

## 2018-10-11 MED ORDER — PNEUMOCOCCAL VAC POLYVALENT 25 MCG/0.5ML IJ INJ
0.5000 mL | INJECTION | INTRAMUSCULAR | Status: AC
  Administered 2018-10-12: 0.5 mL via INTRAMUSCULAR
  Filled 2018-10-11: qty 0.5

## 2018-10-11 MED ORDER — IPRATROPIUM-ALBUTEROL 0.5-2.5 (3) MG/3ML IN SOLN
3.0000 mL | Freq: Four times a day (QID) | RESPIRATORY_TRACT | Status: DC
  Administered 2018-10-11 – 2018-10-12 (×2): 3 mL via RESPIRATORY_TRACT
  Filled 2018-10-11 (×2): qty 3

## 2018-10-11 MED ORDER — SODIUM CHLORIDE 0.9 % IV SOLN
500.0000 mg | INTRAVENOUS | Status: DC
  Administered 2018-10-12 – 2018-10-13 (×2): 500 mg via INTRAVENOUS
  Filled 2018-10-11 (×2): qty 500

## 2018-10-11 MED ORDER — ASPIRIN EC 81 MG PO TBEC
81.0000 mg | DELAYED_RELEASE_TABLET | Freq: Every day | ORAL | Status: DC
  Administered 2018-10-12 – 2018-10-14 (×3): 81 mg via ORAL
  Filled 2018-10-11 (×4): qty 1

## 2018-10-11 MED ORDER — SODIUM CHLORIDE 0.9 % IV SOLN
500.0000 mg | INTRAVENOUS | Status: DC
  Administered 2018-10-11: 500 mg via INTRAVENOUS
  Filled 2018-10-11 (×2): qty 500

## 2018-10-11 MED ORDER — SODIUM CHLORIDE 0.9 % IV SOLN
2.0000 g | INTRAVENOUS | Status: DC
  Administered 2018-10-11: 2 g via INTRAVENOUS
  Filled 2018-10-11: qty 20

## 2018-10-11 MED ORDER — METHYLPREDNISOLONE SODIUM SUCC 40 MG IJ SOLR
40.0000 mg | Freq: Four times a day (QID) | INTRAMUSCULAR | Status: DC
  Administered 2018-10-11 – 2018-10-13 (×7): 40 mg via INTRAVENOUS
  Filled 2018-10-11 (×7): qty 1

## 2018-10-11 MED ORDER — METRONIDAZOLE IN NACL 5-0.79 MG/ML-% IV SOLN
500.0000 mg | Freq: Three times a day (TID) | INTRAVENOUS | Status: DC
  Administered 2018-10-11 – 2018-10-12 (×3): 500 mg via INTRAVENOUS
  Filled 2018-10-11 (×3): qty 100

## 2018-10-11 MED ORDER — METRONIDAZOLE IN NACL 5-0.79 MG/ML-% IV SOLN: 500.0000 mg | Freq: Three times a day (TID) | INTRAVENOUS | Status: DC

## 2018-10-11 MED ORDER — ATORVASTATIN CALCIUM 10 MG PO TABS
20.0000 mg | ORAL_TABLET | Freq: Every day | ORAL | Status: DC
  Administered 2018-10-12 – 2018-10-13 (×2): 20 mg via ORAL
  Filled 2018-10-11 (×3): qty 2

## 2018-10-11 MED ORDER — MONTELUKAST SODIUM 10 MG PO TABS
10.0000 mg | ORAL_TABLET | Freq: Every day | ORAL | Status: DC
  Administered 2018-10-12 – 2018-10-13 (×2): 10 mg via ORAL
  Filled 2018-10-11 (×2): qty 1

## 2018-10-11 MED ORDER — SODIUM CHLORIDE 0.9 % IV SOLN
INTRAVENOUS | Status: DC
  Administered 2018-10-11 – 2018-10-12 (×2): via INTRAVENOUS

## 2018-10-11 NOTE — H&P (Signed)
TRH H&P   Patient Demographics:    Luis Carter, is a 73 y.o. male  MRN: [id number]   DOB - 1945/11/22  Admit Date - 10/11/2018  Outpatient Primary MD for the patient is Fleet Contras, MD  Patient coming from: home  Chief Complaint  Patient presents with  . Respiratory Distress      HPI:    Luis Carter  is a 73 y.o. male, with past medical history significant for COPD with ongoing tobacco use, mild diastolic heart failure, diabetes type 2, and hypertension who was apparently in his usual state of good health until he was found today by his daughter with acute shortness of breath. History is entirely per daughter as patient is on BiPAP and unable to speak. Patient's daughter lives with the patient.  Patient's daughter states that he was doing well yesterday and was walking around the house and playing with his great grandchild as per usual. His baseline functional status is mostly housebound although he does occasionally walk down to the corner store. Patient's daughter states that he went to bed feeling fine however this morning she heard him calling out "help me help me". She found him lying in bed gasping for air and acutely short of breath. He was able to tell her that he felt unwell in the middle of the night and went to the bathroom and had vomiting and then fell. He was able to get back up and get into bed but has become progressively short of breath overnight.  Prior to this acute illness patient has not had any fevers or chills or cough. There are no sick contacts at home. No flu exposures known. At present patient specifically denies any chest pain or palpitations. He admits to feeling weak and tired and  short of breath. He denies any productive cough. He does admit to continued tobacco use.    Review of systems:    In addition to the HPI above No Fever-chills, No Headache, No changes with Vision or hearing, Bowel movements are regular, No Blood in stool or Urine, No dysuria, No new skin rashes or bruises, No new joints pains-aches,  No new weakness, tingling, numbness in any extremity, No recent weight gain or loss, No polyuria, polydypsia or polyphagia, No significant Mental Stressors.  A full 10 point Review of Systems was  done, except as stated above, all other Review of Systems were negative.   With Past History of the following :    Past Medical History:  Diagnosis Date  . Arthritis    "left arm/shoulder; both legs" (12/12/2015)  . ASTHMA    since childhood  . Cardiomyopathy (HCC) 01/25/2016   Echo 12/13/15 - Mild LVH, EF 45-50%, normal wall motion, grade 1 diastolic dysfunction, mild LAE, mild RVE, mild RAE  . COPD 04/14/2007  . Coronary artery disease   . DIABETES MELLITUS, TYPE II 04/14/2007  . GERD (gastroesophageal reflux disease)   . GI bleed   . Gout   . High cholesterol   . History of nuclear stress test    a.  Myoview 6/17: EF 54%, no ischemia  . HYPERTENSION 04/14/2007  . LEG PAIN, RIGHT 05/19/2008  . LOW BACK PAIN 04/14/2007  . Myocardial infarction (HCC)    ~ 2001/notes 09/13/2001 (03/22/2013)  . PANCREATITIS, HX OF 04/14/2007  . Pneumonia    "twice" (12/12/2015)  . SEIZURE DISORDER 04/14/2007   "used to have them when I was young" (03/22/2013)  . Shortness of breath    "related to asthma" (03/22/2013)      Past Surgical History:  Procedure Laterality Date  . CORONARY ANGIOPLASTY WITH STENT PLACEMENT    . ESOPHAGOGASTRODUODENOSCOPY  12/21/2011   Procedure: ESOPHAGOGASTRODUODENOSCOPY (EGD);  Surgeon: Louis Meckelobert D Kaplan, MD;  [facility];  Service: Endoscopy;  Laterality: N/A;      Social History:     Social History   Tobacco Use  . Smoking  status: Former Smoker    Packs/day: 0.25    Years: 57.00    Pack years: 14.25    Types: Cigarettes    Last attempt to quit: 11/24/2015    Years since quitting: 2.8  . Smokeless tobacco: Never Used  Substance Use Topics  . Alcohol use: Yes    Comment: 12/12/2015 "quit drinking years ago; never had problem w/it"       Family History :     Family History  Problem Relation Age of Onset  . Heart disease Sister   . Heart disease Brother   . Heart disease Brother   . Heart disease Brother   . Heart disease Brother       Home Medications:   Prior to Admission medications   Medication Sig Start Date End Date Taking? Authorizing Provider  albuterol (PROAIR HFA) 108 (90 BASE) MCG/ACT inhaler INHALE 2 PUFFS INTO THE LUNGS EVERY 6 (SIX) HOURS AS NEEDED FOR WHEEZING. Patient taking differently: Inhale 2 puffs into the lungs every 6 (six) hours as needed. AS NEEDED FOR WHEEZING. 03/05/15  Yes Gordy SaversKwiatkowski, Peter F, MD  allopurinol (ZYLOPRIM) 100 MG tablet Take 2 tablets (200 mg total) by mouth daily. 03/05/15  Yes Gordy SaversKwiatkowski, Peter F, MD  aspirin ([facility] ASPIRIN LOW DOSE) 81 MG EC tablet Take 1 tablet (81 mg total) by mouth daily. Swallow whole. 12/17/16  Yes Gordy SaversKwiatkowski, Peter F, MD  atorvastatin (LIPITOR) 20 MG tablet TAKE 1 TABLET BY MOUTH EVERY DAY Patient taking differently: Take 20 mg by mouth daily at 6 PM.  06/02/16  Yes Gordy SaversKwiatkowski, Peter F, MD  lisinopril (PRINIVIL,ZESTRIL) 20 MG tablet Take 1 tablet (20 mg total) by mouth daily. 03/05/15  Yes Gordy SaversKwiatkowski, Peter F, MD  montelukast (SINGULAIR) 10 MG tablet TAKE 1 TABLET BY MOUTH AT BEDTIME Patient taking differently: Take 10 mg by mouth at bedtime.  06/02/16  Yes Gordy SaversKwiatkowski, Peter F, MD  omeprazole [facility](PRILOSEC)  20 MG capsule Take 1 capsule (20 mg total) by mouth daily. 03/05/15  Yes Gordy SaversKwiatkowski, Peter F, MD  Vitamin D, Ergocalciferol, (DRISDOL) 50000 units CAPS capsule Take 50,000 Units by mouth once a week. Monday 05/14/17  Yes [provider]  amLODipine (NORVASC) 5 MG tablet TAKE 1 TABLET BY MOUTH EVERY DAY Patient not taking: No sig reported 06/02/16   Gordy SaversKwiatkowski, Peter F, MD  colchicine (COLCRYS) 0.6 MG tablet TAKE 1 TABLET BY MOUTH TWICE A DAY AS NEEDED FOR GOUT FLARE-UPS Patient not taking: Reported on 10/11/2018 03/05/15   Gordy SaversKwiatkowski, Peter F, MD  cyclobenzaprine (FLEXERIL) 10 MG tablet TAKE 1 TABLET BY MOUTH AT BEDTIME Patient not taking: Reported on 10/11/2018 09/04/14   Gordy SaversKwiatkowski, Peter F, MD  diclofenac sodium (VOLTAREN) 1 % GEL APPLY TO AFFECTED AREA 4 TIMES A DAY Patient not taking: Reported on 10/11/2018 09/04/14   Gordy SaversKwiatkowski, Peter F, MD  Fluticasone-Salmeterol (ADVAIR) 250-50 MCG/DOSE AEPB Inhale 1 puff into the lungs 2 (two) times daily.    [provider]  levofloxacin (LEVAQUIN) 750 MG tablet Take 1 tablet (750 mg total) by mouth daily. Patient not taking: Reported on 10/11/2018 09/14/16   Shaune PollackIsaacs, Cameron, MD  metFORMIN (GLUCOPHAGE) 500 MG tablet TAKE 1 TABLET BY MOUTH TWICE A DAY Patient not taking: No sig reported 09/05/15   Gordy SaversKwiatkowski, Peter F, MD  montelukast (SINGULAIR) 10 MG tablet Take 1 tablet (10 mg total) by mouth at bedtime. Patient not taking: Reported on 10/11/2018 03/05/15   Gordy SaversKwiatkowski, Peter F, MD  oseltamivir (TAMIFLU) 75 MG capsule Take 1 capsule (75 mg total) by mouth every 12 (twelve) hours. Patient not taking: Reported on 10/11/2018 09/14/16   Shaune PollackIsaacs, Cameron, MD  traMADol (ULTRAM) 50 MG tablet Take 1 tablet (50 mg total) by mouth every 8 (eight) hours as needed. for pain Patient not taking: Reported on 10/11/2018 03/07/16   Gordy SaversKwiatkowski, Peter F, MD  triamcinolone cream (KENALOG) 0.1 % Apply 1 application topically 2 (two) times daily. Patient not taking: Reported on 10/11/2018 03/05/15   Gordy SaversKwiatkowski, Peter F, MD     Allergies:    No Known Allergies   Physical Exam:   Vitals  Blood pressure 132/75, pulse (!) 106, temperature 99.6 F (37.6 C), temperature source Rectal,  resp. rate (!) 28, height 5\' 9"  (1.753 m), weight 77.1 kg, SpO2 99 %.   General very weak-appearing man looking older than stated age lying in bed at 20 with BiPAP in place. Respiratory rate is around 20 and unlabored but shallow. Awake Alert, Oriented X 3. Ears and Eyes appear Normal, Conjunctivae clear, PERRLA. Moist Oral Mucosa. No JVD. Patient does have reasonable air entry with some mechanical breath sounds but he has diffuse low pitched wheezes and rhonchorous breath sounds in both lung fields. Heart is regular, tachycardia and no murmurs. Telemetry is nondistended, nontender, soft with normoactive bowel sounds. Extremities without any edema or wounds.   Data Review:    CBC Recent Labs  Lab 10/11/18 0855 10/11/18 0909 10/11/18 1142  WBC 15.7*  --   --   HGB 13.4 13.6 11.9*  HCT 42.7 40.0 35.0*  PLT 279  --   --   MCV 88.0  --   --   MCH 27.6  --   --   MCHC 31.4  --   --   RDW 15.1  --   --   LYMPHSABS 1.3  --   --   MONOABS 0.6  --   --   EOSABS 0.0  --   --  BASOSABS 0.0  --   --    ------------------------------------------------------------------------------------------------------------------  Chemistries  Recent Labs  Lab 10/11/18 0855 10/11/18 0909 10/11/18 1142  NA 139 139 140  K 3.9 3.5 4.0  CL 103  --   --   CO2 22  --   --   GLUCOSE 134*  --   --   BUN 22  --   --   CREATININE 2.57*  --   --   CALCIUM 8.8*  --   --   AST 31  --   --   ALT 13  --   --   ALKPHOS 88  --   --   BILITOT 1.2  --   --    ------------------------------------------------------------------------------------------------------------------ estimated creatinine clearance is 25.2 mL/min (A) (by C-G formula based on SCr of 2.57 mg/dL (H)). ------------------------------------------------------------------------------------------------------------------ No results for input(s): TSH, T4TOTAL, T3FREE, THYROIDAB in the last 72 hours.  Invalid input(s): FREET3  Coagulation  profile No results for input(s): INR, PROTIME in the last 168 hours. ------------------------------------------------------------------------------------------------------------------- No results for input(s): DDIMER in the last 72 hours. -------------------------------------------------------------------------------------------------------------------  Cardiac Enzymes Recent Labs  Lab 10/11/18 0855  TROPONINI 0.03*   ------------------------------------------------------------------------------------------------------------------    Component Value Date/Time   BNP 200.8 (H) 10/11/2018 0855     ---------------------------------------------------------------------------------------------------------------  Urinalysis    Component Value Date/Time   COLORURINE YELLOW 12/12/2015 1416   APPEARANCEUR CLEAR 12/12/2015 1416   LABSPEC 1.024 12/12/2015 1416   PHURINE 6.0 12/12/2015 1416   GLUCOSEU NEGATIVE 12/12/2015 1416   HGBUR NEGATIVE 12/12/2015 1416   BILIRUBINUR NEGATIVE 12/12/2015 1416   KETONESUR NEGATIVE 12/12/2015 1416   PROTEINUR NEGATIVE 12/12/2015 1416   UROBILINOGEN 1.0 03/21/2013 2316   NITRITE NEGATIVE 12/12/2015 1416   LEUKOCYTESUR NEGATIVE 12/12/2015 1416    ----------------------------------------------------------------------------------------------------------------   Imaging Results:    Dg Chest Portable 1 View  Result Date: 10/11/2018 CLINICAL DATA:  Cough. EXAM: PORTABLE CHEST 1 VIEW COMPARISON:  Radiographs of September 29, 2017. FINDINGS: The heart size and mediastinal contours are within normal limits. No pneumothorax or pleural effusion is noted. Left lung is clear. Right upper lobe airspace opacity is noted concerning for pneumonia. The visualized skeletal structures are unremarkable. IMPRESSION: Right upper lobe airspace opacity most consistent with pneumonia. Followup PA and lateral chest X-ray is recommended in 3-4 weeks following trial of antibiotic  therapy to ensure resolution and exclude underlying malignancy. Electronically Signed   By: Lupita Raider, M.D.   On: 10/11/2018 09:12    My personal review of EKG: Rhythm NSR At 106, he does demonstrate multiple PVCs, no acute ST-T wave changes noted.  Assessment & Plan:    Principal Problem:   Respiratory failure with hypoxia and hypercapnia (HCC) Active Problems:   Diabetes mellitus with renal complications (HCC)   Essential hypertension   COPD (chronic obstructive pulmonary disease) (HCC)   Chronic low back pain with sciatica   Tobacco use disorder  ACUTE HYPOXIC AND HYPERCARBIC RESPIRATORY FAILURE WITH SEPSIS Patient being treated with aggressive fluid resuscitation and early initiation of antibiotics in the ED Respiratory status is being supported on BiPAP He is doing better with improved blood pressure and good oxygenation at 100% We'll continue present course of treatment, admit patient to stepdown unit  RIGHT UPPER LOBE PNEUMONIA Community-acquired pneumonia vs possible aspiration pneumonia He received ceftriaxone and azithromycin in the emergency room Continue with above, add Flagyl to cover anaerobes for possible aspiration  COPD On BiPAP with antibiotics as noted above Duo nebs every 6  hours and albuterol every 2 hours when necessary Will add Solu-Medrol 40 mg every 8 hours Follow fingerstick's closely  ACUTE ON CHRONIC RENAL FAILURE Patient being treated with fluid resuscitation and antibiotics Avoid renal toxic medications, hold lisinopril, allopurinol, colchicine and diclofenac on home med rec  MULTIPLE PVC Patient's calcium is low at 1.0 ionized calcium Potassium is within normal limits at 4 Magnesium was ordered and pending Will replete and recheck Keep patient on telemetry QTC is normal at 471  MINIMALLY ELEVATED TROPONIN AT 0.03 Patient denies any chest pain and EKG shows no acute ST-T wave changes  Will follow every 6  hours 3    HYPOCALCEMIA Ionized calcium at 1.0 Unclear etiology, possibly vitamin D deficiency, she dispensed. Vitamin D per med rec Will order calcium gluconate 1 g 1 Calcium in the morning  DM2 Per medicine reconciliation patient appealing is not taking his metformin Will treat with sliding scale coverage every 6 hours until patient is eating  HTN Hold lisinopril given hypotension and acute renal failure Hold amlodipine which patient apparently does not seem to be taking anyway  GOUT Hold colchicine which patient is not taking any way  GERD Continue PPI  ONGOING TOBACCO USE     DVT Prophylaxis Heparin -  Lovenox   AM Labs Ordered, also please review Full Orders  Family Communication: Admission, patients condition and plan of care including tests being ordered have been discussed with the patient and patient's daughter who indicate understanding and agree with the plan and Code Status.  Code Status full  Likely DC to  home  Condition GUARDED    Consults called: patient was seen by intensivist in the ED and admitted to stepdown.    Admission status: inpatient    Time spent in minutes : 60   Pieter Partridge M.D on 10/11/2018 at 12:50 PM  To page go to [email or web address] - password [facility]

## 2018-10-11 NOTE — ED Notes (Signed)
Pt called out and wants a break from bipap. RT notified and in room to remove mask and see how pt does.

## 2018-10-11 NOTE — ED Triage Notes (Signed)
Per [facility], pt complains of cough x 2 days, respiratory distress this morning. Wheezing initially, given neb, then EMS could auscultated rales, then placed on CPAP for RR of 60/min. Axox4.

## 2018-10-11 NOTE — ED Provider Notes (Signed)
[name] EMERGENCY DEPARTMENT Provider Note   CSN: [id number] Arrival date & time: 10/11/18  4696     History   Chief Complaint Chief Complaint  Patient presents with  . Respiratory Distress    HPI Luis Carter is a 73 y.o. male.  HPI   73 yo M with PMHx as below here with SOB. History limited 2/2 WOB. Per his report, he's had increasing cough, SOB x 3-4 days. He feltl ike he couldn't breathe today so called 911. On EMS arrival, pt with RR 60s, wheezing, increased WOB. He was given a breathing tx and placed on CPAP with mod relief. He feels somewhat better. He denies any fever or sputum production. No increased LE edema. He does not recall any specific sick contacts. No CP. No abd pain, n/v. No specific aggravating or alleviating factors.  Past Medical History:  Diagnosis Date  . Arthritis    "left arm/shoulder; both legs" (12/12/2015)  . ASTHMA    since childhood  . Cardiomyopathy (HCC) 01/25/2016   Echo 12/13/15 - Mild LVH, EF 45-50%, normal wall motion, grade 1 diastolic dysfunction, mild LAE, mild RVE, mild RAE  . COPD 04/14/2007  . Coronary artery disease   . DIABETES MELLITUS, TYPE II 04/14/2007  . GERD (gastroesophageal reflux disease)   . GI bleed   . Gout   . High cholesterol   . History of nuclear stress test    a.  Myoview 6/17: EF 54%, no ischemia  . HYPERTENSION 04/14/2007  . LEG PAIN, RIGHT 05/19/2008  . LOW BACK PAIN 04/14/2007  . Myocardial infarction (HCC)    ~ 2001/notes 09/13/2001 (03/22/2013)  . PANCREATITIS, HX OF 04/14/2007  . Pneumonia    "twice" (12/12/2015)  . SEIZURE DISORDER 04/14/2007   "used to have them when I was young" (03/22/2013)  . Shortness of breath    "related to asthma" (03/22/2013)    Patient Active Problem List   Diagnosis Date Noted  . Acute pain of left shoulder 10/15/2016  . Pain in right leg 10/15/2016  . Weakness of right leg 10/15/2016  . Cardiomyopathy (HCC) 01/25/2016  . Chronic renal disease,  stage III (HCC) 12/13/2015  . Abnormal echocardiogram 12/13/2015  . Syncope 12/12/2015  . Tobacco use disorder 09/07/2015  . Gout 01/27/2012  . Cocaine use 12/22/2011  . 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 Essential hypertension 04/14/2007  . Asthma 04/14/2007  . COPD (chronic obstructive pulmonary disease) (HCC) 04/14/2007  . 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 Seizures (HCC) 04/14/2007  . PANCREATITIS, HX OF 04/14/2007    Past Surgical History:  Procedure Laterality Date  . CORONARY ANGIOPLASTY WITH STENT PLACEMENT    . ESOPHAGOGASTRODUODENOSCOPY  12/21/2011   Procedure: ESOPHAGOGASTRODUODENOSCOPY (EGD);  Surgeon: Louis Meckel, MD;  Location: [facility] ENDOSCOPY;  Service: Endoscopy;  Laterality: N/A;        Home Medications    Prior to Admission medications   Medication Sig Start Date End Date Taking? Authorizing Provider  albuterol (PROAIR HFA) 108 (90 BASE) MCG/ACT inhaler INHALE 2 PUFFS INTO THE LUNGS EVERY 6 (SIX) HOURS AS NEEDED FOR WHEEZING. Patient taking differently: Inhale 2 puffs into the lungs every 6 (six) hours as needed. AS NEEDED FOR WHEEZING. 03/05/15  Yes Gordy Savers, MD  allopurinol (ZYLOPRIM) 100 MG tablet Take 2 tablets (200 mg total) by mouth daily. 03/05/15  Yes Gordy Savers, MD  aspirin ([facility] ASPIRIN LOW DOSE) 81 MG EC tablet Take  1 tablet (81 mg total) by mouth daily. Swallow whole. 12/17/16  Yes Gordy Savers, MD  atorvastatin (LIPITOR) 20 MG tablet TAKE 1 TABLET BY MOUTH EVERY DAY Patient taking differently: Take 20 mg by mouth daily at 6 PM.  06/02/16  Yes Gordy Savers, MD  lisinopril (PRINIVIL,ZESTRIL) 20 MG tablet Take 1 tablet (20 mg total) by mouth daily. 03/05/15  Yes Gordy Savers, MD  montelukast (SINGULAIR) 10 MG tablet TAKE 1 TABLET BY MOUTH AT BEDTIME Patient taking differently: Take 10 mg by mouth at bedtime.  06/02/16  Yes Gordy Savers, MD  omeprazole (PRILOSEC)  20 MG capsule Take 1 capsule (20 mg total) by mouth daily. 03/05/15  Yes Gordy Savers, MD  Vitamin D, Ergocalciferol, (DRISDOL) 50000 units CAPS capsule Take 50,000 Units by mouth once a week. Monday 05/14/17  Yes [provider]  amLODipine (NORVASC) 5 MG tablet TAKE 1 TABLET BY MOUTH EVERY DAY Patient not taking: No sig reported 06/02/16   Gordy Savers, MD  colchicine (COLCRYS) 0.6 MG tablet TAKE 1 TABLET BY MOUTH TWICE A DAY AS NEEDED FOR GOUT FLARE-UPS Patient not taking: Reported on 10/11/2018 03/05/15   Gordy Savers, MD  cyclobenzaprine (FLEXERIL) 10 MG tablet TAKE 1 TABLET BY MOUTH AT BEDTIME Patient not taking: Reported on 10/11/2018 09/04/14   Gordy Savers, MD  diclofenac sodium (VOLTAREN) 1 % GEL APPLY TO AFFECTED AREA 4 TIMES A DAY Patient not taking: Reported on 10/11/2018 09/04/14   Gordy Savers, MD  Fluticasone-Salmeterol (ADVAIR) 250-50 MCG/DOSE AEPB Inhale 1 puff into the lungs 2 (two) times daily.    [provider]  levofloxacin (LEVAQUIN) 750 MG tablet Take 1 tablet (750 mg total) by mouth daily. Patient not taking: Reported on 10/11/2018 09/14/16   Shaune Pollack, MD  metFORMIN (GLUCOPHAGE) 500 MG tablet TAKE 1 TABLET BY MOUTH TWICE A DAY Patient not taking: No sig reported 09/05/15   Gordy Savers, MD  montelukast (SINGULAIR) 10 MG tablet Take 1 tablet (10 mg total) by mouth at bedtime. Patient not taking: Reported on 10/11/2018 03/05/15   Gordy Savers, MD  oseltamivir (TAMIFLU) 75 MG capsule Take 1 capsule (75 mg total) by mouth every 12 (twelve) hours. Patient not taking: Reported on 10/11/2018 09/14/16   Shaune Pollack, MD  traMADol (ULTRAM) 50 MG tablet Take 1 tablet (50 mg total) by mouth every 8 (eight) hours as needed. for pain Patient not taking: Reported on 10/11/2018 03/07/16   Gordy Savers, MD  triamcinolone cream (KENALOG) 0.1 % Apply 1 application topically 2 (two) times daily. Patient not taking:  Reported on 10/11/2018 03/05/15   Gordy Savers, MD    Family History Family History  Problem Relation Age of Onset  . Heart disease Sister   . Heart disease Brother   . Heart disease Brother   . Heart disease Brother   . Heart disease Brother     Social History Social History   Tobacco Use  . Smoking status: Former Smoker    Packs/day: 0.25    Years: 57.00    Pack years: 14.25    Types: Cigarettes    Last attempt to quit: 11/24/2015    Years since quitting: 2.8  . Smokeless tobacco: Never Used  Substance Use Topics  . Alcohol use: Yes    Comment: 12/12/2015 "quit drinking years ago; never had problem w/it"  . Drug use: No     Allergies   Patient has no known  allergies.   Review of Systems Review of Systems  Constitutional: Positive for chills and fatigue. Negative for fever.  HENT: Negative for congestion and rhinorrhea.   Eyes: Negative for visual disturbance.  Respiratory: Positive for cough, shortness of breath and wheezing.   Cardiovascular: Negative for chest pain and leg swelling.  Gastrointestinal: Negative for abdominal pain, diarrhea, nausea and vomiting.  Genitourinary: Negative for dysuria and flank pain.  Musculoskeletal: Negative for neck pain and neck stiffness.  Skin: Negative for rash and wound.  Allergic/Immunologic: Negative for immunocompromised state.  Neurological: Positive for weakness. Negative for syncope and headaches.  All other systems reviewed and are negative.    Physical Exam Updated Vital Signs BP 132/75   Pulse (!) 106   Temp 99.6 F (37.6 C) (Rectal)   Resp (!) 28   Ht 5\' 9"  (1.753 m)   Wt 77.1 kg   SpO2 99%   BMI 25.10 kg/m   Physical Exam Vitals signs and nursing note reviewed.  Constitutional:      General: He is not in acute distress.    Appearance: He is well-developed. He is ill-appearing.  HENT:     Head: Normocephalic and atraumatic.  Eyes:     Conjunctiva/sclera: Conjunctivae normal.  Neck:      Musculoskeletal: Neck supple.  Cardiovascular:     Rate and Rhythm: Normal rate and regular rhythm.     Heart sounds: Normal heart sounds. No murmur. No friction rub.  Pulmonary:     Effort: Tachypnea present. No respiratory distress.     Breath sounds: Decreased air movement present. Examination of the right-lower field reveals rales. Examination of the left-lower field reveals rales. Rhonchi and rales present. No wheezing.  Abdominal:     General: There is no distension.     Palpations: Abdomen is soft.     Tenderness: There is no abdominal tenderness.  Skin:    General: Skin is warm.     Capillary Refill: Capillary refill takes less than 2 seconds.  Neurological:     Mental Status: He is alert and oriented to person, place, and time.     Motor: No abnormal muscle tone.      ED Treatments / Results  Labs (all labs ordered are listed, but only abnormal results are displayed) Labs Reviewed  CBC WITH DIFFERENTIAL/PLATELET - Abnormal; Notable for the following components:      Result Value   WBC 15.7 (*)    Neutro Abs 13.7 (*)    Abs Immature Granulocytes 0.09 (*)    All other components within normal limits  COMPREHENSIVE METABOLIC PANEL - Abnormal; Notable for the following components:   Glucose, Bld 134 (*)    Creatinine, Ser 2.57 (*)    Calcium 8.8 (*)    GFR calc non Af Amer 24 (*)    GFR calc Af Amer 27 (*)    All other components within normal limits  LACTIC ACID, PLASMA - Abnormal; Notable for the following components:   Lactic Acid, Venous 4.3 (*)    All other components within normal limits  BRAIN NATRIURETIC PEPTIDE - Abnormal; Notable for the following components:   B Natriuretic Peptide 200.8 (*)    All other components within normal limits  TROPONIN I - Abnormal; Notable for the following components:   Troponin I 0.03 (*)    All other components within normal limits  POCT I-STAT EG7 - Abnormal; Notable for the following components:   pO2, Ven 19.0 (*)  Acid-base deficit 3.0 (*)    Calcium, Ion 1.10 (*)    All other components within normal limits  POCT I-STAT EG7 - Abnormal; Notable for the following components:   pH, Ven 7.242 (*)    pO2, Ven 23.0 (*)    Acid-base deficit 5.0 (*)    Calcium, Ion 1.00 (*)    HCT 35.0 (*)    Hemoglobin 11.9 (*)    All other components within normal limits  CULTURE, BLOOD (ROUTINE X 2)  CULTURE, BLOOD (ROUTINE X 2)  INFLUENZA PANEL BY PCR (TYPE A & B)  BLOOD GAS, VENOUS  BLOOD GAS, VENOUS  LACTIC ACID, PLASMA  LACTIC ACID, PLASMA    EKG EKG Interpretation  Date/Time:  Monday October 11 2018 08:47:05 EST Ventricular Rate:  106 PR Interval:    QRS Duration: 98 QT Interval:  354 QTC Calculation: 471 R Axis:   66 Text Interpretation:  Sinus tachycardia Ventricular trigeminy Right atrial enlargement Since last EKG, PVCs are now new Confirmed by Shaune PollackIsaacs, Anice Wilshire [phone]) on 10/11/2018 8:52:12 AM Also confirmed by Shaune PollackIsaacs, Zenobia Kuennen [phone]), editor Elita QuickWatlington, [place] [phone])  on 10/11/2018 10:59:59 AM   Radiology Dg Chest Portable 1 View  Result Date: 10/11/2018 CLINICAL DATA:  Cough. EXAM: PORTABLE CHEST 1 VIEW COMPARISON:  Radiographs of September 29, 2017. FINDINGS: The heart size and mediastinal contours are within normal limits. No pneumothorax or pleural effusion is noted. Left lung is clear. Right upper lobe airspace opacity is noted concerning for pneumonia. The visualized skeletal structures are unremarkable. IMPRESSION: Right upper lobe airspace opacity most consistent with pneumonia. Followup PA and lateral chest X-ray is recommended in 3-4 weeks following trial of antibiotic therapy to ensure resolution and exclude underlying malignancy. Electronically Signed   By: Lupita RaiderJames  Green Jr, M.D.   On: 10/11/2018 09:12    Procedures .Critical Care Performed by: Shaune PollackIsaacs, Arrow Tomko, MD Authorized by: Shaune PollackIsaacs, Sharese Manrique, MD   Critical care provider statement:    Critical care time (minutes):  35   Critical  care time was exclusive of:  Separately billable procedures and treating other patients and teaching time   Critical care was necessary to treat or prevent imminent or life-threatening deterioration of the following conditions:  Respiratory failure, circulatory failure, cardiac failure and sepsis   Critical care was time spent personally by me on the following activities:  Development of treatment plan with patient or surrogate, discussions with consultants, evaluation of patient's response to treatment, examination of patient, obtaining history from patient or surrogate, ordering and performing treatments and interventions, ordering and review of laboratory studies, ordering and review of radiographic studies, pulse oximetry, re-evaluation of patient's condition and review of old charts   I assumed direction of critical care for this patient from another provider in my specialty: no     (including critical care time)  Medications Ordered in ED Medications  cefTRIAXone (ROCEPHIN) 2 g in sodium chloride 0.9 % 100 mL IVPB (0 g Intravenous Stopped 10/11/18 0955)  azithromycin (ZITHROMAX) 500 mg in sodium chloride 0.9 % 250 mL IVPB (0 mg Intravenous Stopped 10/11/18 1034)  sodium chloride 0.9 % bolus 1,000 mL (0 mLs Intravenous Stopped 10/11/18 1016)  sodium chloride 0.9 % bolus 1,000 mL (0 mLs Intravenous Stopped 10/11/18 1016)  sodium chloride 0.9 % bolus 500 mL (0 mLs Intravenous Stopped 10/11/18 1041)  ipratropium-albuterol (DUONEB) 0.5-2.5 (3) MG/3ML nebulizer solution 3 mL (3 mLs Nebulization Given 10/11/18 0955)  methylPREDNISolone sodium succinate (SOLU-MEDROL) 125 mg/2 mL injection 125 mg (125 mg Intravenous  Given 10/11/18 0944)  ipratropium-albuterol (DUONEB) 0.5-2.5 (3) MG/3ML nebulizer solution 3 mL (3 mLs Nebulization Given 10/11/18 1208)     Initial Impression / Assessment and Plan / ED Course  I have reviewed the triage vital signs and the nursing notes.  Pertinent labs & imaging results  that were available during my care of the patient were reviewed by me and considered in my medical decision making (see chart for details).     73 year old male here with acute on chronic hypoxic respiratory failure, likely secondary to severe sepsis from right upper lobe pneumonia.  Patient mentating well.  He was continued on BiPAP and fluids, broad-spectrum antibiotics were given.  Repeat gas shows that patient respiratory status is at least stable.  His blood pressure is improved with fluids.  Discussed with intensivist on-call, who recommends medicine admission to stepdown.  Will continue aggressive care, closely monitor.  May ultimately need the unit if respiratory status worsens.  Final Clinical Impressions(s) / ED Diagnoses   Final diagnoses:  Pneumonia of right middle lobe due to Chlamydia species  Sepsis due to pneumonia [facility])  Acute respiratory failure with hypoxia [facility])    ED Discharge Orders    None       Shaune Pollack, MD 10/11/18 1233

## 2018-10-11 NOTE — Progress Notes (Signed)
RT note: RT removed patient from BIPAP and placed on 3.L nasal canula. Patient requested to be taken off for a rest. Vital signs stable at this time, will continue to monitor.

## 2018-10-11 NOTE — ED Notes (Signed)
RT notified of duo neb.

## 2018-10-12 LAB — BASIC METABOLIC PANEL
Anion gap: 12 (ref 5–15)
BUN: 27 mg/dL — ABNORMAL HIGH (ref 8–23)
CO2: 21 mmol/L — ABNORMAL LOW (ref 22–32)
Calcium: 7.7 mg/dL — ABNORMAL LOW (ref 8.9–10.3)
Chloride: 106 mmol/L (ref 98–111)
Creatinine, Ser: 2.06 mg/dL — ABNORMAL HIGH (ref 0.61–1.24)
GFR calc Af Amer: 36 mL/min — ABNORMAL LOW (ref >60)
GFR calc non Af Amer: 31 mL/min — ABNORMAL LOW (ref >60)
GLUCOSE: 204 mg/dL — AB (ref 70–99)
Potassium: 4.7 mmol/L (ref 3.5–5.1)
Sodium: 139 mmol/L (ref 135–145)

## 2018-10-12 LAB — CBC WITH DIFFERENTIAL/PLATELET
Abs Immature Granulocytes: 1.89 10*3/uL — ABNORMAL HIGH (ref 0.00–0.07)
Basophils Absolute: 0.1 10*3/uL (ref 0.0–0.1)
Basophils Relative: 0 %
EOS ABS: 0.1 10*3/uL (ref 0.0–0.5)
Eosinophils Relative: 1 %
HEMATOCRIT: 37.6 % — AB (ref 39.0–52.0)
Hemoglobin: 11.7 g/dL — ABNORMAL LOW (ref 13.0–17.0)
Immature Granulocytes: 7 %
Lymphocytes Relative: 5 %
Lymphs Abs: 1.3 10*3/uL (ref 0.7–4.0)
MCH: 27.3 pg (ref 26.0–34.0)
MCHC: 31.1 g/dL (ref 30.0–36.0)
MCV: 87.9 fL (ref 80.0–100.0)
Monocytes Absolute: 0.9 10*3/uL (ref 0.1–1.0)
Monocytes Relative: 4 %
Neutro Abs: 21.4 10*3/uL — ABNORMAL HIGH (ref 1.7–7.7)
Neutrophils Relative %: 83 %
Platelets: 237 10*3/uL (ref 150–400)
RBC: 4.28 MIL/uL (ref 4.22–5.81)
RDW: 15.4 % (ref 11.5–15.5)
WBC: 25.6 10*3/uL — ABNORMAL HIGH (ref 4.0–10.5)
nRBC: 0 % (ref 0.0–0.2)

## 2018-10-12 LAB — GLUCOSE, CAPILLARY
GLUCOSE-CAPILLARY: 178 mg/dL — AB (ref 70–99)
Glucose-Capillary: 152 mg/dL — ABNORMAL HIGH (ref 70–99)
Glucose-Capillary: 149 mg/dL — ABNORMAL HIGH (ref 70–99)
Glucose-Capillary: 116 mg/dL — ABNORMAL HIGH (ref 70–99)

## 2018-10-12 LAB — HEMOGLOBIN A1C
Hgb A1c MFr Bld: 5.9 % — ABNORMAL HIGH (ref 4.8–5.6)
MEAN PLASMA GLUCOSE: 123 mg/dL

## 2018-10-12 LAB — TROPONIN I: Troponin I: 0.06 ng/mL (ref <0.03)

## 2018-10-12 LAB — STREP PNEUMONIAE URINARY ANTIGEN: Strep Pneumo Urinary Antigen: POSITIVE — AB

## 2018-10-12 LAB — HIV ANTIBODY (ROUTINE TESTING W REFLEX): HIV Screen 4th Generation wRfx: NONREACTIVE

## 2018-10-12 MED ORDER — DIPHENHYDRAMINE HCL 25 MG PO CAPS: 25.0000 mg | ORAL_CAPSULE | ORAL | Status: DC | PRN

## 2018-10-12 MED ORDER — SODIUM CHLORIDE 0.9 % IV SOLN
INTRAVENOUS | Status: DC
  Administered 2018-10-12 – 2018-10-13 (×2): via INTRAVENOUS

## 2018-10-12 MED ORDER — IPRATROPIUM-ALBUTEROL 0.5-2.5 (3) MG/3ML IN SOLN
3.0000 mL | Freq: Three times a day (TID) | RESPIRATORY_TRACT | Status: DC
  Administered 2018-10-12 – 2018-10-14 (×6): 3 mL via RESPIRATORY_TRACT
  Filled 2018-10-12 (×6): qty 3

## 2018-10-12 MED ORDER — DIPHENHYDRAMINE HCL 25 MG PO CAPS
50.0000 mg | ORAL_CAPSULE | Freq: Once | ORAL | Status: AC
  Administered 2018-10-12: 50 mg via ORAL
  Filled 2018-10-12: qty 2

## 2018-10-12 NOTE — Progress Notes (Signed)
PROGRESS NOTE    Luis Carter  ZOX:[id number] DOB: 06/12/1946 DOA: 10/11/2018 PCP: Fleet ContrasAvbuere, Edwin, MD     Brief Narrative:  Luis DillsLinwood E Surman is a 73 y.o. male, with past medical history significant for COPD with ongoing tobacco use, mild diastolic heart failure, diabetes type 2, and hypertension who was apparently in his usual state of good health until he was found by his daughter with acute shortness of breath. Patient's daughter states that he was doing well yesterday and was walking around the house and playing with his great grandchild as per usual. His baseline functional status is mostly housebound although he does occasionally walk down to the corner store. Patient's daughter states that he went to bed feeling fine however this morning she heard him calling out "help me help me". She found him lying in bed gasping for air and acutely short of breath. He was able to tell her that he felt unwell in the middle of the night and went to the bathroom and had vomiting and then fell. He was able to get back up and get into bed but has become progressively short of breath overnight. He was admitted for acute hypoxemic, hypercapnic respiratory failure due to RUL CAP, COPD and was placed on BiPAP.  New events last 24 hours / Subjective: Now off BiPAP. Main complaint is hunger.   Assessment & Plan:   Principal Problem:   Respiratory failure with hypoxia and hypercapnia (HCC) Active Problems:   Diabetes mellitus with renal complications (HCC)   Essential hypertension   COPD (chronic obstructive pulmonary disease) (HCC)   Chronic low back pain with sciatica   Tobacco use disorder  Acute hypoxemic, hypercarbic respiratory failure -Now off BiPAP but still requiring Elsie O2. Wean as able to room air.  He does not use oxygen at baseline  Severe sepsis secondary to RUL CAP, strep pneumo -Influenza negative -Blood cultures pending at this time -Continue Rocephin, azithromycin -Will need  repeat chest x-ray to ensure resolution of pneumonia  COPD exacerbation -Continue Solu-Medrol, nebs, antibiotic as above  Dysphagia -Appreciate SLP, started on dysphagia 3 diet  AKI on CKD stage III -Baseline creatinine 1.3-1.4 -Hold lisinopril, allopurinol, colchicine, diclofenac -Improving with IVF   Demand ischemia -Troponin mildly elevated, flat trend, likely secondary to above processes  Type 2 diabetes -Well-controlled A1c 5.9 -Sliding-scale insulin  Essential hypertension -Hold lisinopril due to AKI  GERD -Continue PPI  Tobacco abuse -Cessation counseling    DVT prophylaxis: Lovenox Code Status: Full code Family Communication: At bedside Disposition Plan: Pending further improvement of respiratory status, not yet back to baseline   Consultants:   None  Procedures:   None  Antimicrobials:  Anti-infectives (From admission, onward)   Start     Dose/Rate Route Frequency Ordered Stop   10/12/18 1000  cefTRIAXone (ROCEPHIN) 1 g in sodium chloride 0.9 % 100 mL IVPB     1 g 200 mL/hr over 30 Minutes Intravenous Every 24 hours 10/11/18 1536 10/19/18 0959   10/12/18 0800  azithromycin (ZITHROMAX) 500 mg in sodium chloride 0.9 % 250 mL IVPB     500 mg 250 mL/hr over 60 Minutes Intravenous Every 24 hours 10/11/18 1536 10/19/18 0759   10/11/18 1800  metroNIDAZOLE (FLAGYL) IVPB 500 mg  Status:  Discontinued     500 mg 100 mL/hr over 60 Minutes Intravenous Every 8 hours 10/11/18 1536 10/11/18 1542   10/11/18 1645  metroNIDAZOLE (FLAGYL) IVPB 500 mg  Status:  Discontinued     500  mg 100 mL/hr over 60 Minutes Intravenous Every 8 hours 10/11/18 1542 10/12/18 1212   10/11/18 0930  cefTRIAXone (ROCEPHIN) 2 g in sodium chloride 0.9 % 100 mL IVPB  Status:  Discontinued     2 g 200 mL/hr over 30 Minutes Intravenous Every 24 hours 10/11/18 0916 10/11/18 1544   10/11/18 0930  azithromycin (ZITHROMAX) 500 mg in sodium chloride 0.9 % 250 mL IVPB  Status:  Discontinued      500 mg 250 mL/hr over 60 Minutes Intravenous Every 24 hours 10/11/18 0916 10/11/18 1544       Objective: Vitals:   10/12/18 0346 10/12/18 0716 10/12/18 0809 10/12/18 1148  BP: 109/62  105/63 (!) 98/54  Pulse: 90  89 87  Resp: (!) 24  16 18   Temp: 98 F (36.7 C)  97.6 F (36.4 C) 97.9 F (36.6 C)  TempSrc: Oral  Oral Oral  SpO2:  100% 100% 100%  Weight:      Height:        Intake/Output Summary (Last 24 hours) at 10/12/2018 1214 Last data filed at 10/12/2018 1124 Gross per 24 hour  Intake 1711.5 ml  Output -  Net 1711.5 ml   Filed Weights   10/11/18 0846  Weight: 77.1 kg    Examination:  General exam: Appears calm and comfortable  Respiratory system: Wheezes bilaterally, dyspneic, on nasal cannula O2 Cardiovascular system: S1 & S2 heard, RRR. No JVD, murmurs, rubs, gallops or clicks. No pedal edema. Gastrointestinal system: Abdomen is nondistended, soft and nontender. No organomegaly or masses felt. Normal bowel sounds heard. Central nervous system: Alert and oriented. No focal neurological deficits. Extremities: Symmetric 5 x 5 power. Skin: No rashes, lesions or ulcers Psychiatry: Judgement and insight appear normal. Mood & affect appropriate.   Data Reviewed: I have personally reviewed following labs and imaging studies  CBC: Recent Labs  Lab 10/11/18 0855 10/11/18 0909 10/11/18 1142 10/12/18 0222  WBC 15.7*  --   --  25.6*  NEUTROABS 13.7*  --   --  21.4*  HGB 13.4 13.6 11.9* 11.7*  HCT 42.7 40.0 35.0* 37.6*  MCV 88.0  --   --  87.9  PLT 279  --   --  237   Basic Metabolic Panel: Recent Labs  Lab 10/11/18 0855 10/11/18 0909 10/11/18 1142 10/12/18 0222  NA 139 139 140 139  K 3.9 3.5 4.0 4.7  CL 103  --   --  106  CO2 22  --   --  21*  GLUCOSE 134*  --   --  204*  BUN 22  --   --  27*  CREATININE 2.57*  --   --  2.06*  CALCIUM 8.8*  --   --  7.7*   GFR: Estimated Creatinine Clearance: 31.5 mL/min (A) (by C-G formula based on SCr of 2.06 mg/dL  (H)). Liver Function Tests: Recent Labs  Lab 10/11/18 0855  AST 31  ALT 13  ALKPHOS 88  BILITOT 1.2  PROT 7.0  ALBUMIN 3.5   No results for input(s): LIPASE, AMYLASE in the last 168 hours. No results for input(s): AMMONIA in the last 168 hours. Coagulation Profile: No results for input(s): INR, PROTIME in the last 168 hours. Cardiac Enzymes: Recent Labs  Lab 10/11/18 0855 10/11/18 1511 10/11/18 2057 10/12/18 0222  TROPONINI 0.03* 0.04* 0.06* 0.06*   BNP (last 3 results) No results for input(s): PROBNP in the last 8760 hours. HbA1C: Recent Labs    10/11/18 1511  HGBA1C  5.9*   CBG: Recent Labs  Lab 10/11/18 1644 10/11/18 2119 10/12/18 0816 10/12/18 1146  GLUCAP 128* 162* 152* 149*   Lipid Profile: No results for input(s): CHOL, HDL, LDLCALC, TRIG, CHOLHDL, LDLDIRECT in the last 72 hours. Thyroid Function Tests: No results for input(s): TSH, T4TOTAL, FREET4, T3FREE, THYROIDAB in the last 72 hours. Anemia Panel: No results for input(s): VITAMINB12, FOLATE, FERRITIN, TIBC, IRON, RETICCTPCT in the last 72 hours. Sepsis Labs: Recent Labs  Lab 10/11/18 0855 10/11/18 1204 10/11/18 1514  LATICACIDVEN 4.3* 3.0* 3.6*    Recent Results (from the past 240 hour(s))  Blood culture (routine x 2)     Status: None (Preliminary result)   Collection Time: 10/11/18  9:53 AM  Result Value Ref Range Status   Specimen Description BLOOD SITE NOT SPECIFIED  Final   Special Requests   Final    BOTTLES DRAWN AEROBIC AND ANAEROBIC Blood Culture adequate volume   Culture   Final    NO GROWTH 1 DAY Performed at [facility], [address], [place], [place] [address]    Report Status PENDING  Incomplete  Blood culture (routine x 2)     Status: None (Preliminary result)   Collection Time: 10/11/18  9:53 AM  Result Value Ref Range Status   Specimen Description BLOOD SITE NOT SPECIFIED  Final   Special Requests   Final    BOTTLES DRAWN AEROBIC AND ANAEROBIC Blood Culture  adequate volume   Culture   Final    NO GROWTH 1 DAY Performed at [facility], [address] [place], [place] [address]    Report Status PENDING  Incomplete  MRSA PCR Screening     Status: None   Collection Time: 10/11/18  3:44 PM  Result Value Ref Range Status   MRSA by PCR NEGATIVE NEGATIVE Final    Comment:        The GeneXpert MRSA Assay (FDA approved for NASAL specimens only), is one component of a comprehensive MRSA colonization surveillance program. It is not intended to diagnose MRSA infection nor to guide or monitor treatment for MRSA infections. Performed at [facility], [address], [place], [place] [address]        Radiology Studies: Dg Chest Portable 1 View  Result Date: 10/11/2018 CLINICAL DATA:  Cough. EXAM: PORTABLE CHEST 1 VIEW COMPARISON:  Radiographs of September 29, 2017. FINDINGS: The heart size and mediastinal contours are within normal limits. No pneumothorax or pleural effusion is noted. Left lung is clear. Right upper lobe airspace opacity is noted concerning for pneumonia. The visualized skeletal structures are unremarkable. IMPRESSION: Right upper lobe airspace opacity most consistent with pneumonia. Followup PA and lateral chest X-ray is recommended in 3-4 weeks following trial of antibiotic therapy to ensure resolution and exclude underlying malignancy. Electronically Signed   By: Lupita Raider, M.D.   On: 10/11/2018 09:12      Scheduled Meds: . aspirin EC  81 mg Oral Daily  . atorvastatin  20 mg Oral q1800  . enoxaparin (LOVENOX) injection  30 mg Subcutaneous Q24H  . insulin aspart  0-9 Units Subcutaneous TID WC  . ipratropium-albuterol  3 mL Nebulization TID  . methylPREDNISolone (SOLU-MEDROL) injection  40 mg Intravenous Q6H  . montelukast  10 mg Oral QHS  . pantoprazole  40 mg Oral Daily  . pneumococcal 23 valent vaccine  0.5 mL Intramuscular Tomorrow-1000   Continuous Infusions: . sodium chloride    . azithromycin 500  mg (10/12/18 0901)  . cefTRIAXone (ROCEPHIN)  IV 1 g (10/12/18 1124)     LOS: 1 day    Time spent: 40 minutes   Noralee StainJennifer Landon Bassford, DO [facility] [email or web address] 10/12/2018, 12:14 PM

## 2018-10-12 NOTE — Evaluation (Signed)
Clinical/Bedside Swallow Evaluation Patient Details  Name: Luis Carter MRN: [id number] Date of Birth: [birth year]  Today's Date: 10/12/2018 Time: SLP Start Time (ACUTE ONLY): 1048 SLP Stop Time (ACUTE ONLY): 1105 SLP Time Calculation (min) (ACUTE ONLY): 17 min  Past Medical History:  Past Medical History:  Diagnosis Date  . Arthritis    "left arm/shoulder; both legs" (12/12/2015)  . ASTHMA    since childhood  . Cardiomyopathy (HCC) 01/25/2016   Echo 12/13/15 - Mild LVH, EF 45-50%, normal wall motion, grade 1 diastolic dysfunction, mild LAE, mild RVE, mild RAE  . COPD 04/14/2007  . Coronary artery disease   . DIABETES MELLITUS, TYPE II 04/14/2007  . GERD (gastroesophageal reflux disease)   . GI bleed   . Gout   . High cholesterol   . History of nuclear stress test    a.  Myoview 6/17: EF 54%, no ischemia  . HYPERTENSION 04/14/2007  . LEG PAIN, RIGHT 05/19/2008  . LOW BACK PAIN 04/14/2007  . Myocardial infarction (HCC)    ~ 2001/notes 09/13/2001 (03/22/2013)  . PANCREATITIS, HX OF 04/14/2007  . Pneumonia    "twice" (12/12/2015)  . SEIZURE DISORDER 04/14/2007   "used to have them when I was young" (03/22/2013)  . Shortness of breath    "related to asthma" (03/22/2013)   Past Surgical History:  Past Surgical History:  Procedure Laterality Date  . CORONARY ANGIOPLASTY WITH STENT PLACEMENT    . ESOPHAGOGASTRODUODENOSCOPY  12/21/2011   Procedure: ESOPHAGOGASTRODUODENOSCOPY (EGD);  Surgeon: Louis Meckel, MD;  Location: [facility] ENDOSCOPY;  Service: Endoscopy;  Laterality: N/A;   HPI:  73 year old male with h/o GERD and COPD admitted with right upper lobe PNA.    Assessment / Plan / Recommendation Clinical Impression  Patient presents with what appears to be a normal oropharyngeal swallow. Oral phase functional, mildly prolonged with solids with patient complaining of dry mouth and SOB impacting swifter mastication. Independently following solids with liquids to aid in oral clearance and  requesting softer foods until back to baseline. No definite overt signs of aspiration noted however patient with baseline coughing which continued, and possibly increased, during po intake. Additionally patient is inconsistent with reports of difficultly swallowing prior to admission. In light of these two factors, will follow along briefly for tolerance. Do not suspect that patient will require additional SLP services. Should also note that patient reported one episode of vomitting and diarrhea  "last week" which may have accounted for an aspiration episode. Will f/u.  SLP Visit Diagnosis: Dysphagia, unspecified (R13.10)    Aspiration Risk  Mild aspiration risk    Diet Recommendation Dysphagia 3 (Mech soft);Thin liquid   Liquid Administration via: Cup;Straw Medication Administration: Whole meds with liquid Supervision: Patient able to self feed Compensations: Slow rate;Small sips/bites(take frequent breaks to control SOB) Postural Changes: Seated upright at 90 degrees    Other  Recommendations Oral Care Recommendations: Oral care BID   Follow up Recommendations None      Frequency and Duration min 1 x/week  1 week           Swallow Study   General HPI: 73 year old male with h/o GERD and COPD admitted with right upper lobe PNA.  Type of Study: Bedside Swallow Evaluation Previous Swallow Assessment: none Diet Prior to this Study: Regular;Thin liquids Temperature Spikes Noted: No Respiratory Status: Nasal cannula History of Recent Intubation: No Behavior/Cognition: Alert;Cooperative;Pleasant mood Oral Cavity Assessment: Dry Oral Care Completed by SLP: Recent completion by staff Oral  Cavity - Dentition: Poor condition;Missing dentition Vision: Functional for self-feeding Self-Feeding Abilities: Able to feed self Patient Positioning: Upright in bed Baseline Vocal Quality: Normal Volitional Cough: Congested Volitional Swallow: Able to elicit    Oral/Motor/Sensory Function  Overall Oral Motor/Sensory Function: Within functional limits   Ice Chips Ice chips: Within functional limits   Thin Liquid Thin Liquid: Within functional limits    Nectar Thick Nectar Thick Liquid: Not tested   Honey Thick Honey Thick Liquid: Not tested   Puree Puree: Within functional limits Presentation: Spoon;Self Fed   Solid     Solid: Within functional limits Presentation: Self Fed     Sander Speckman MA, CCC-SLP   Druanne Bosques Meryl 10/12/2018,11:19 AM

## 2018-10-13 DIAGNOSIS — F17200 Nicotine dependence, unspecified, uncomplicated: Secondary | ICD-10-CM

## 2018-10-13 DIAGNOSIS — J189 Pneumonia, unspecified organism: Secondary | ICD-10-CM

## 2018-10-13 DIAGNOSIS — A419 Sepsis, unspecified organism: Secondary | ICD-10-CM

## 2018-10-13 DIAGNOSIS — J181 Lobar pneumonia, unspecified organism: Secondary | ICD-10-CM

## 2018-10-13 DIAGNOSIS — J441 Chronic obstructive pulmonary disease with (acute) exacerbation: Secondary | ICD-10-CM

## 2018-10-13 LAB — BASIC METABOLIC PANEL
Anion gap: 10 (ref 5–15)
BUN: 30 mg/dL — AB (ref 8–23)
CO2: 19 mmol/L — ABNORMAL LOW (ref 22–32)
Calcium: 7.3 mg/dL — ABNORMAL LOW (ref 8.9–10.3)
Chloride: 110 mmol/L (ref 98–111)
Creatinine, Ser: 1.48 mg/dL — ABNORMAL HIGH (ref 0.61–1.24)
GFR calc Af Amer: 53 mL/min — ABNORMAL LOW (ref >60)
GFR calc non Af Amer: 46 mL/min — ABNORMAL LOW (ref >60)
GLUCOSE: 167 mg/dL — AB (ref 70–99)
Potassium: 3.6 mmol/L (ref 3.5–5.1)
Sodium: 139 mmol/L (ref 135–145)

## 2018-10-13 LAB — GLUCOSE, CAPILLARY
Glucose-Capillary: 185 mg/dL — ABNORMAL HIGH (ref 70–99)
Glucose-Capillary: 287 mg/dL — ABNORMAL HIGH (ref 70–99)
Glucose-Capillary: 162 mg/dL — ABNORMAL HIGH (ref 70–99)
Glucose-Capillary: 197 mg/dL — ABNORMAL HIGH (ref 70–99)

## 2018-10-13 LAB — CBC
HCT: 31.6 % — ABNORMAL LOW (ref 39.0–52.0)
Hemoglobin: 10 g/dL — ABNORMAL LOW (ref 13.0–17.0)
MCH: 27 pg (ref 26.0–34.0)
MCHC: 31.6 g/dL (ref 30.0–36.0)
MCV: 85.2 fL (ref 80.0–100.0)
Platelets: 201 10*3/uL (ref 150–400)
RBC: 3.71 MIL/uL — ABNORMAL LOW (ref 4.22–5.81)
RDW: 15.5 % (ref 11.5–15.5)
WBC: 26.5 10*3/uL — ABNORMAL HIGH (ref 4.0–10.5)
nRBC: 0 % (ref 0.0–0.2)

## 2018-10-13 LAB — ALBUMIN: Albumin: 2.4 g/dL — ABNORMAL LOW (ref 3.5–5.0)

## 2018-10-13 MED ORDER — ENOXAPARIN SODIUM 40 MG/0.4ML ~~LOC~~ SOLN
40.0000 mg | SUBCUTANEOUS | Status: DC
  Administered 2018-10-13: 40 mg via SUBCUTANEOUS
  Filled 2018-10-13: qty 0.4

## 2018-10-13 MED ORDER — CEFUROXIME AXETIL 500 MG PO TABS: 500.0000 mg | ORAL_TABLET | Freq: Two times a day (BID) | ORAL | 0 refills | Status: DC

## 2018-10-13 MED ORDER — AZITHROMYCIN 250 MG PO TABS: 250.0000 mg | ORAL_TABLET | Freq: Every day | ORAL | 0 refills | Status: DC

## 2018-10-13 MED ORDER — METHYLPREDNISOLONE SODIUM SUCC 40 MG IJ SOLR
40.0000 mg | Freq: Two times a day (BID) | INTRAMUSCULAR | Status: DC
  Administered 2018-10-13 – 2018-10-14 (×2): 40 mg via INTRAVENOUS
  Filled 2018-10-13 (×2): qty 1

## 2018-10-13 MED ORDER — METFORMIN HCL 500 MG PO TABS: 500.0000 mg | ORAL_TABLET | Freq: Two times a day (BID) | ORAL | 1 refills | Status: DC

## 2018-10-13 MED ORDER — AZITHROMYCIN 250 MG PO TABS
500.0000 mg | ORAL_TABLET | Freq: Every day | ORAL | Status: DC
  Administered 2018-10-14: 500 mg via ORAL
  Filled 2018-10-13: qty 2

## 2018-10-13 MED ORDER — HYDROCODONE-ACETAMINOPHEN 7.5-325 MG PO TABS: 1.0000 | ORAL_TABLET | ORAL | 0 refills | Status: DC | PRN

## 2018-10-13 MED ORDER — HYDROCODONE-ACETAMINOPHEN 7.5-325 MG PO TABS
1.0000 | ORAL_TABLET | ORAL | Status: DC | PRN
  Administered 2018-10-13: 1 via ORAL
  Filled 2018-10-13: qty 1

## 2018-10-13 NOTE — Progress Notes (Signed)
PROGRESS NOTE    Luis Carter   YOK:[id number]  DOB: April 04, 1946  DOA: 10/11/2018 PCP: Fleet Contras, MD   Brief Narrative:  Luis Carter is a 73 y.o.male,with past medical history significant for COPD with ongoing tobacco use, mild diastolic heart failure, diabetes type 2, and hypertension who was apparently in his usual state of good health until he was found by his daughter with acute shortness of breath. Patient's daughter states that he was doing well yesterday and was walking around the house and playing with his great grandchild as per usual. His baseline functional status is mostly housebound although he does occasionally walk down to the corner store. Patient's daughter states that he went to bed feeling fine however this morning she heard him calling out "help me help me". She found him lying in bed gasping for air and acutely short of breath. He was able to tell her that he felt unwell in the middle of the night and went to the bathroom and had vomiting and then fell. He was able to get back up and get into bed but has become progressively short of breath overnight. He was admitted for acute hypoxemic, hypercapnic respiratory failure due to RUL CAP, COPD and was placed on BiPAP.   Subjective: He states that his shortness of breath and cough have improved.  He is having pain in his left hip which is chronic.  He typically takes either Tylenol or Motrin for it at home but this has never helped his pain.  He states his pain is worse when he wakes up in the morning and when he walks.    Assessment & Plan:   Principal Problem:   Respiratory failure with hypoxia and hypercapnia, leukocytosis, tachycardia and hypotension-sepsis -Secondary to COPD exacerbation in the setting of a right upper lobe pneumonia -At this point I will continue ceftriaxone and a azithromycin-can change azithromycin to oral today -Wean IV steroids, continue SVN treatments routine and as needed  Active  Problems: Left hip pain-acute on chronic -As Motrin has been ineffective for him and as he has chronic kidney disease, will try a dose of hydrocodone today - His daughter is trying to get him into an orthopedic surgeon for further management of this chronic pain  Acute on chronic kidney disease -Baseline creatinine is about 1.3-1.6 -She presented with a creatinine of 2.57 which is steadily improving-creatinine today is 1.48 -I will DC IV fluids and encourage oral hydration    Diabetes mellitus with renal complications   -Continue sliding scale insulin- he takes metformin at home which is currently on hold    Essential hypertension -Currently receiving lisinopril and amlodipine at home -BP is currently normal and therefore these have not been resumed    Tobacco use disorder -has been counseled on stopping smoking  Time spent in minutes: 35 minutes DVT prophylaxis: Lovenox Code Status: Full code Family Communication: Wife at bedside Disposition Plan: Home in 1 to 2 days Consultants:   None Procedures:   None Antimicrobials:  Anti-infectives (From admission, onward)   Start     Dose/Rate Route Frequency Ordered Stop   10/12/18 1000  cefTRIAXone (ROCEPHIN) 1 g in sodium chloride 0.9 % 100 mL IVPB     1 g 200 mL/hr over 30 Minutes Intravenous Every 24 hours 10/11/18 1536 10/19/18 0959   10/12/18 0800  azithromycin (ZITHROMAX) 500 mg in sodium chloride 0.9 % 250 mL IVPB     500 mg 250 mL/hr over 60 Minutes Intravenous Every  24 hours 10/11/18 1536 10/19/18 0759   10/11/18 1800  metroNIDAZOLE (FLAGYL) IVPB 500 mg  Status:  Discontinued     500 mg 100 mL/hr over 60 Minutes Intravenous Every 8 hours 10/11/18 1536 10/11/18 1542   10/11/18 1645  metroNIDAZOLE (FLAGYL) IVPB 500 mg  Status:  Discontinued     500 mg 100 mL/hr over 60 Minutes Intravenous Every 8 hours 10/11/18 1542 10/12/18 1212   10/11/18 0930  cefTRIAXone (ROCEPHIN) 2 g in sodium chloride 0.9 % 100 mL IVPB  Status:   Discontinued     2 g 200 mL/hr over 30 Minutes Intravenous Every 24 hours 10/11/18 0916 10/11/18 1544   10/11/18 0930  azithromycin (ZITHROMAX) 500 mg in sodium chloride 0.9 % 250 mL IVPB  Status:  Discontinued     500 mg 250 mL/hr over 60 Minutes Intravenous Every 24 hours 10/11/18 0916 10/11/18 1544       Objective: Vitals:   10/12/18 2247 10/13/18 0727 10/13/18 0730 10/13/18 0800  BP: 105/72  126/76   Pulse: 88 73 78   Resp: 20 18  16   Temp: (!) 97.4 F (36.3 C)  (!) 97.5 F (36.4 C)   TempSrc: Oral  Oral   SpO2: 100% 100% 100%   Weight:      Height:        Intake/Output Summary (Last 24 hours) at 10/13/2018 0824 Last data filed at 10/13/2018 0093 Gross per 24 hour  Intake 3188.93 ml  Output 200 ml  Net 2988.93 ml   Filed Weights   10/11/18 0846  Weight: 77.1 kg    Examination: General exam: Appears comfortable  HEENT: PERRLA, oral mucosa moist, no sclera icterus or thrush Respiratory system: Respiratory effort normal.  Rhonchi in bilateral lung fields Cardiovascular system: S1 & S2 heard, RRR.   Gastrointestinal system: Abdomen soft, non-tender, nondistended. Normal bowel sounds. Central nervous system: Alert and oriented. No focal neurological deficits. Extremities: No cyanosis, clubbing or edema Skin: No rashes or ulcers Psychiatry:  Mood & affect appropriate.     Data Reviewed: I have personally reviewed following labs and imaging studies  CBC: Recent Labs  Lab 10/11/18 0855 10/11/18 0909 10/11/18 1142 10/12/18 0222 10/13/18 0214  WBC 15.7*  --   --  25.6* 26.5*  NEUTROABS 13.7*  --   --  21.4*  --   HGB 13.4 13.6 11.9* 11.7* 10.0*  HCT 42.7 40.0 35.0* 37.6* 31.6*  MCV 88.0  --   --  87.9 85.2  PLT 279  --   --  237 201   Basic Metabolic Panel: Recent Labs  Lab 10/11/18 0855 10/11/18 0909 10/11/18 1142 10/12/18 0222 10/13/18 0214  NA 139 139 140 139 139  K 3.9 3.5 4.0 4.7 3.6  CL 103  --   --  106 110  CO2 22  --   --  21* 19*    GLUCOSE 134*  --   --  204* 167*  BUN 22  --   --  27* 30*  CREATININE 2.57*  --   --  2.06* 1.48*  CALCIUM 8.8*  --   --  7.7* 7.3*   GFR: Estimated Creatinine Clearance: 43.8 mL/min (A) (by C-G formula based on SCr of 1.48 mg/dL (H)). Liver Function Tests: Recent Labs  Lab 10/11/18 0855  AST 31  ALT 13  ALKPHOS 88  BILITOT 1.2  PROT 7.0  ALBUMIN 3.5   No results for input(s): LIPASE, AMYLASE in the last 168 hours. No results  for input(s): AMMONIA in the last 168 hours. Coagulation Profile: No results for input(s): INR, PROTIME in the last 168 hours. Cardiac Enzymes: Recent Labs  Lab 10/11/18 0855 10/11/18 1511 10/11/18 2057 10/12/18 0222  TROPONINI 0.03* 0.04* 0.06* 0.06*   BNP (last 3 results) No results for input(s): PROBNP in the last 8760 hours. HbA1C: Recent Labs    10/11/18 1511  HGBA1C 5.9*   CBG: Recent Labs  Lab 10/11/18 2119 10/12/18 0816 10/12/18 1146 10/12/18 1558 10/12/18 2035  GLUCAP 162* 152* 149* 116* 178*   Lipid Profile: No results for input(s): CHOL, HDL, LDLCALC, TRIG, CHOLHDL, LDLDIRECT in the last 72 hours. Thyroid Function Tests: No results for input(s): TSH, T4TOTAL, FREET4, T3FREE, THYROIDAB in the last 72 hours. Anemia Panel: No results for input(s): VITAMINB12, FOLATE, FERRITIN, TIBC, IRON, RETICCTPCT in the last 72 hours. Urine analysis:    Component Value Date/Time   COLORURINE YELLOW 12/12/2015 1416   APPEARANCEUR CLEAR 12/12/2015 1416   LABSPEC 1.024 12/12/2015 1416   PHURINE 6.0 12/12/2015 1416   GLUCOSEU NEGATIVE 12/12/2015 1416   HGBUR NEGATIVE 12/12/2015 1416   BILIRUBINUR NEGATIVE 12/12/2015 1416   KETONESUR NEGATIVE 12/12/2015 1416   PROTEINUR NEGATIVE 12/12/2015 1416   UROBILINOGEN 1.0 03/21/2013 2316   NITRITE NEGATIVE 12/12/2015 1416   LEUKOCYTESUR NEGATIVE 12/12/2015 1416   Sepsis Labs: @LABRCNTIP (procalcitonin:4,lacticidven:4) ) Recent Results (from the past 240 hour(s))  Blood culture (routine x  2)     Status: None (Preliminary result)   Collection Time: 10/11/18  9:53 AM  Result Value Ref Range Status   Specimen Description BLOOD SITE NOT SPECIFIED  Final   Special Requests   Final    BOTTLES DRAWN AEROBIC AND ANAEROBIC Blood Culture adequate volume   Culture   Final    NO GROWTH 1 DAY Performed at [facility], [address] [place], [place] [address]    Report Status PENDING  Incomplete  Blood culture (routine x 2)     Status: None (Preliminary result)   Collection Time: 10/11/18  9:53 AM  Result Value Ref Range Status   Specimen Description BLOOD SITE NOT SPECIFIED  Final   Special Requests   Final    BOTTLES DRAWN AEROBIC AND ANAEROBIC Blood Culture adequate volume   Culture   Final    NO GROWTH 1 DAY Performed at [facility], [address] [place], [place] [address]    Report Status PENDING  Incomplete  MRSA PCR Screening     Status: None   Collection Time: 10/11/18  3:44 PM  Result Value Ref Range Status   MRSA by PCR NEGATIVE NEGATIVE Final    Comment:        The GeneXpert MRSA Assay (FDA approved for NASAL specimens only), is one component of a comprehensive MRSA colonization surveillance program. It is not intended to diagnose MRSA infection nor to guide or monitor treatment for MRSA infections. Performed at [facility], [address] [place], [place] [address]          Radiology Studies: Dg Chest Portable 1 View  Result Date: 10/11/2018 CLINICAL DATA:  Cough. EXAM: PORTABLE CHEST 1 VIEW COMPARISON:  Radiographs of September 29, 2017. FINDINGS: The heart size and mediastinal contours are within normal limits. No pneumothorax or pleural effusion is noted. Left lung is clear. Right upper lobe airspace opacity is noted concerning for pneumonia. The visualized skeletal structures are unremarkable. IMPRESSION: Right upper lobe airspace opacity most consistent with pneumonia. Followup PA  and lateral chest X-ray is recommended in 3-4  weeks following trial of antibiotic therapy to ensure resolution and exclude underlying malignancy. Electronically Signed   By: Lupita RaiderJames  Green Jr, M.D.   On: 10/11/2018 09:12      Scheduled Meds: . aspirin EC  81 mg Oral Daily  . atorvastatin  20 mg Oral q1800  . enoxaparin (LOVENOX) injection  30 mg Subcutaneous Q24H  . insulin aspart  0-9 Units Subcutaneous TID WC  . ipratropium-albuterol  3 mL Nebulization TID  . methylPREDNISolone (SOLU-MEDROL) injection  40 mg Intravenous Q12H  . montelukast  10 mg Oral QHS  . pantoprazole  40 mg Oral Daily   Continuous Infusions: . sodium chloride 75 mL/hr at 10/13/18 0301  . azithromycin 500 mg (10/12/18 0901)  . cefTRIAXone (ROCEPHIN)  IV Stopped (10/12/18 2115)     LOS: 2 days      Luis CantorSaima Rogelio Waynick, MD [facility] Pager: [email or web address] Password [email or web address] 10/13/2018, 8:24 AM

## 2018-10-13 NOTE — Progress Notes (Signed)
  Speech Language Pathology Treatment: Dysphagia  Patient Details Name: Luis Carter MRN: [id number] DOB: 07/02/1946 Today's Date: 10/13/2018 Time: 1130-1140 SLP Time Calculation (min) (ACUTE ONLY): 10 min  Assessment / Plan / Recommendation Clinical Impression  Pt demonstrated more definitive tolerance of thin liquids this session with no intermittent baseline cough. Pt did continue to report occasional pain/globus as well as multiple swallow, though symptoms relatively mild and not restricting intake. Provided reminders of basic compensatory strategies of sitting fully upright and following bites with sips which he does not always do at baseline. Family also present for education. Recommend pt continue regular diet and thin liquids will sign off.    HPI HPI: 73 year old male with h/o GERD and COPD admitted with right upper lobe PNA.       SLP Plan  All goals met       Recommendations  Diet recommendations: Regular;Thin liquid Liquids provided via: Cup;Straw Medication Administration: Whole meds with liquid Supervision: Patient able to self feed Compensations: Follow solids with liquid Postural Changes and/or Swallow Maneuvers: Seated upright 90 degrees                Oral Care Recommendations: Oral care BID Follow up Recommendations: None SLP Visit Diagnosis: Dysphagia, unspecified (R13.10) Plan: All goals met       GO               Herbie Baltimore, MA CCC-SLP  Acute Rehabilitation Services Pager [phone] Office [phone]  Lynann Beaver 10/13/2018, 11:59 AM

## 2018-10-13 NOTE — Discharge Summary (Signed)
Physician Discharge Summary  Luis Carter ZOX:[id number] DOB: 09/09/1945 DOA: 10/11/2018  PCP: Fleet ContrasAvbuere, Edwin, MD  Admit date: 10/11/2018 Discharge date: 10/14/2018  Admitted From: home  Disposition:  home   Recommendations for Outpatient Follow-up:  1.  continue to encourage smoking cessation 2. Needs referral from PCP to see an orthopedic surgeon for left hip pain  Home Health:  none  Equipment/Devices:  Rolling walker    Discharge Condition:  stable   CODE STATUS:  Full code   Diet recommendation:  Heart healthy Consultations:  none    Discharge Diagnoses:  Principal Problem:   Respiratory failure with hypoxia and hypercapnia  Due to CAP and COPD exacerbation Active Problems:   Diabetes mellitus with renal complications (HCC)   Essential hypertension Chronic left hip pain   Tobacco use disorder   Brief Summary: Luis DillsLinwood E Spillman [age].o.male,with past medical history significant for COPD with ongoing tobacco use, mild diastolic heart failure, diabetes type 2, and hypertension who was apparently in his usual state of good health until he was found by his daughter with acute shortness of breath. Patient's daughter states that he was doing well yesterday and was walking around the house and playing with his great grandchild as per usual. His baseline functional status is mostly housebound although he does occasionally walk down to the corner store. Patient's daughter states that he went to bed feeling fine however this morning she heard him calling out "help me help me". She found him lying in bed gasping for air and acutely short of breath. He was able to tell her that he felt unwell in the middle of the night and went to the bathroom and had vomiting and then fell. He was able to get back up and get into bed but has become progressively short of breath overnight.He was admitted for acute hypoxemic, hypercapnic respiratory failure due to RUL CAP, COPD and was placed on  BiPAP.  Hospital Course:  Principal Problem:   Respiratory failure with hypoxia and hypercapnia, leukocytosis, tachycardia and hypotension-sepsis -Secondary to COPD exacerbation in the setting of a right upper lobe pneumonia -he has been receiving ceftriaxone and a azithromycin  - his symptoms have improved- he is no longer hypoxic  - he has a leukocytosis which is significant and partly due to IV steroids but is improving on a daily basis- see labs below -Weaned IV steroids to oral-  D/c home on Ceftin and Zithromax x 1 wk  Active Problems: Left hip pain-acute on chronic -As Motrin and Ultram have been ineffective for him, have tried Hydrocodone which has helped - will give him a prescription- He should discontinue his Tramadol - His daughter is trying to get him into an orthopedic surgeon for further management of this chronic pain which is possibly due to arthritis- he may ultimately need a hip replacement  Acute on chronic kidney disease -Baseline creatinine is about 1.3-1.6 -He presented with a creatinine of 2.57 which is steadily improving with IVF Cr now is 1.48      Diabetes mellitus with renal complications   -Utilized sliding scale insulin while hospitalized - he takes metformin at home which will be resumed    Essential hypertension -Prescribed lisinopril and amlodipine at home     Tobacco use disorder -has been counseled on stopping smoking    Discharge Exam: Vitals:   10/14/18 0721 10/14/18 0818  BP:  (!) 148/82  Pulse: 73 76  Resp: 20 13  Temp:  97.6 F (36.4  C)  SpO2: 97% 100%   Vitals:   10/13/18 1703 10/13/18 2319 10/14/18 0721 10/14/18 0818  BP: 123/69 (!) 161/92  (!) 148/82  Pulse: 79 76 73 76  Resp:  20 20 13   Temp: 97.8 F (36.6 C) 98 F (36.7 C)  97.6 F (36.4 C)  TempSrc: Oral   Oral  SpO2: 100% 99% 97% 100%  Weight:      Height:        General: Pt is alert, awake, not in acute distress Cardiovascular: RRR, S1/S2 +, no rubs, no  gallops Respiratory: CTA bilaterally, no wheezing, no rhonchi Abdominal: Soft, NT, ND, bowel sounds + Extremities: no edema, no cyanosis   Discharge Instructions  Discharge Instructions    Diet - low sodium heart healthy   Complete by:  As directed    Diet Carb Modified   Complete by:  As directed    Increase activity slowly   Complete by:  As directed      Allergies as of 10/14/2018   No Known Allergies     Medication List    STOP taking these medications   cyclobenzaprine 10 MG tablet Commonly known as:  FLEXERIL   levofloxacin 750 MG tablet Commonly known as:  LEVAQUIN   oseltamivir 75 MG capsule Commonly known as:  TAMIFLU   traMADol 50 MG tablet Commonly known as:  ULTRAM     TAKE these medications   albuterol 108 (90 Base) MCG/ACT inhaler Commonly known as:  PROAIR HFA INHALE 2 PUFFS INTO THE LUNGS EVERY 6 (SIX) HOURS AS NEEDED FOR WHEEZING. What changed:    how much to take  how to take this  when to take this  reasons to take this  additional instructions   allopurinol 100 MG tablet Commonly known as:  ZYLOPRIM Take 2 tablets (200 mg total) by mouth daily.   amLODipine 5 MG tablet Commonly known as:  NORVASC TAKE 1 TABLET BY MOUTH EVERY DAY   aspirin 81 MG EC tablet Commonly known as:  [employer] ASPIRIN LOW DOSE Take 1 tablet (81 mg total) by mouth daily. Swallow whole.   atorvastatin 20 MG tablet Commonly known as:  LIPITOR TAKE 1 TABLET BY MOUTH EVERY DAY What changed:  when to take this   azithromycin 250 MG tablet Commonly known as:  ZITHROMAX Take 1 tablet (250 mg total) by mouth daily.   cefUROXime 500 MG tablet Commonly known as:  CEFTIN Take 1 tablet (500 mg total) by mouth 2 (two) times daily for 5 days.   colchicine 0.6 MG tablet Commonly known as:  COLCRYS TAKE 1 TABLET BY MOUTH TWICE A DAY AS NEEDED FOR GOUT FLARE-UPS   diclofenac sodium 1 % Gel Commonly known as:  VOLTAREN APPLY TO AFFECTED AREA 4 TIMES A DAY    Fluticasone-Salmeterol 250-50 MCG/DOSE Aepb Commonly known as:  ADVAIR Inhale 1 puff into the lungs 2 (two) times daily.   HYDROcodone-acetaminophen 7.5-325 MG tablet Commonly known as:  NORCO Take 1 tablet by mouth every 4 (four) hours as needed for moderate pain.   lisinopril 20 MG tablet Commonly known as:  PRINIVIL,ZESTRIL Take 1 tablet (20 mg total) by mouth daily.   metFORMIN 500 MG tablet Commonly known as:  GLUCOPHAGE Take 1 tablet (500 mg total) by mouth 2 (two) times daily.   montelukast 10 MG tablet Commonly known as:  SINGULAIR Take 1 tablet (10 mg total) by mouth at bedtime.   montelukast 10 MG tablet Commonly known as:  SINGULAIR  TAKE 1 TABLET BY MOUTH AT BEDTIME   omeprazole 20 MG capsule Commonly known as:  PRILOSEC Take 1 capsule (20 mg total) by mouth daily.   triamcinolone cream 0.1 % Commonly known as:  KENALOG Apply 1 application topically 2 (two) times daily.   Vitamin D (Ergocalciferol) 1.25 MG (50000 UT) Caps capsule Commonly known as:  DRISDOL Take 50,000 Units by mouth once a week. Monday            Durable Medical Equipment  (From admission, onward)         Start     Ordered   10/13/18 1442  For home use only DME Walker  Once    Question Answer Comment  Patient needs a walker to treat with the following condition Physical deconditioning   Patient needs a walker to treat with the following condition Arthritis      10/13/18 1441         Follow-up Information    [facility] - Dme Follow up.   Why:  rolling walker will be brought up to room prior to [employer] information: [address] [place] [address] [phone]          No Known Allergies   Procedures/Studies:    Dg Chest Portable 1 View  Result Date: 10/11/2018 CLINICAL DATA:  Cough. EXAM: PORTABLE CHEST 1 VIEW COMPARISON:  Radiographs of September 29, 2017. FINDINGS: The heart size and mediastinal contours are within normal limits. No  pneumothorax or pleural effusion is noted. Left lung is clear. Right upper lobe airspace opacity is noted concerning for pneumonia. The visualized skeletal structures are unremarkable. IMPRESSION: Right upper lobe airspace opacity most consistent with pneumonia. Followup PA and lateral chest X-ray is recommended in 3-4 weeks following trial of antibiotic therapy to ensure resolution and exclude underlying malignancy. Electronically Signed   By: Lupita Raider, M.D.   On: 10/11/2018 09:12     The results of significant diagnostics from this hospitalization (including imaging, microbiology, ancillary and laboratory) are listed below for reference.     Microbiology: Recent Results (from the past 240 hour(s))  Blood culture (routine x 2)     Status: None (Preliminary result)   Collection Time: 10/11/18  9:53 AM  Result Value Ref Range Status   Specimen Description BLOOD SITE NOT SPECIFIED  Final   Special Requests   Final    BOTTLES DRAWN AEROBIC AND ANAEROBIC Blood Culture adequate volume   Culture   Final    NO GROWTH 2 DAYS Performed at [facility], [address], [place], [place] [address]    Report Status PENDING  Incomplete  Blood culture (routine x 2)     Status: None (Preliminary result)   Collection Time: 10/11/18  9:53 AM  Result Value Ref Range Status   Specimen Description BLOOD SITE NOT SPECIFIED  Final   Special Requests   Final    BOTTLES DRAWN AEROBIC AND ANAEROBIC Blood Culture adequate volume   Culture   Final    NO GROWTH 2 DAYS Performed at [facility], [address] [place], [place] [address]    Report Status PENDING  Incomplete  MRSA PCR Screening     Status: None   Collection Time: 10/11/18  3:44 PM  Result Value Ref Range Status   MRSA by PCR NEGATIVE NEGATIVE Final    Comment:        The GeneXpert MRSA Assay (FDA approved for NASAL specimens  only), is one component of a comprehensive MRSA colonization surveillance program. It is  not intended to diagnose MRSA infection nor to guide or monitor treatment for MRSA infections. Performed at [facility], [address], [place], [place] [address]      Labs: BNP (last 3 results) Recent Labs    10/11/18 0855  BNP 200.8*   Basic Metabolic Panel: Recent Labs  Lab 10/11/18 0855 10/11/18 0909 10/11/18 1142 10/12/18 0222 10/13/18 0214 10/14/18 0242  NA 139 139 140 139 139 140  K 3.9 3.5 4.0 4.7 3.6 4.5  CL 103  --   --  106 110 108  CO2 22  --   --  21* 19* 22  GLUCOSE 134*  --   --  204* 167* 260*  BUN 22  --   --  27* 30* 29*  CREATININE 2.57*  --   --  2.06* 1.48* 1.41*  CALCIUM 8.8*  --   --  7.7* 7.3* 8.0*   Liver Function Tests: Recent Labs  Lab 10/11/18 0855 10/13/18 1604 10/14/18 0242  AST 31  --   --   ALT 13  --   --   ALKPHOS 88  --   --   BILITOT 1.2  --   --   PROT 7.0  --   --   ALBUMIN 3.5 2.4* 2.4*   No results for input(s): LIPASE, AMYLASE in the last 168 hours. No results for input(s): AMMONIA in the last 168 hours. CBC: Recent Labs  Lab 10/11/18 0855 10/11/18 0909 10/11/18 1142 10/12/18 0222 10/13/18 0214 10/14/18 0242  WBC 15.7*  --   --  25.6* 26.5* 23.6*  NEUTROABS 13.7*  --   --  21.4*  --   --   HGB 13.4 13.6 11.9* 11.7* 10.0* 10.5*  HCT 42.7 40.0 35.0* 37.6* 31.6* 33.4*  MCV 88.0  --   --  87.9 85.2 87.0  PLT 279  --   --  237 201 222   Cardiac Enzymes: Recent Labs  Lab 10/11/18 0855 10/11/18 1511 10/11/18 2057 10/12/18 0222  TROPONINI 0.03* 0.04* 0.06* 0.06*   BNP: Invalid input(s): POCBNP CBG: Recent Labs  Lab 10/13/18 1206 10/13/18 1646 10/13/18 2031 10/14/18 0751 10/14/18 0834  GLUCAP 287* 162* 197* 42* 259*   D-Dimer No results for input(s): DDIMER in the last 72 hours. Hgb A1c Recent Labs    10/11/18 1511  HGBA1C 5.9*   Lipid Profile No results for input(s): CHOL, HDL, LDLCALC, TRIG, CHOLHDL, LDLDIRECT in the last 72 hours. Thyroid function studies No results for  input(s): TSH, T4TOTAL, T3FREE, THYROIDAB in the last 72 hours.  Invalid input(s): FREET3 Anemia work up No results for input(s): VITAMINB12, FOLATE, FERRITIN, TIBC, IRON, RETICCTPCT in the last 72 hours. Urinalysis    Component Value Date/Time   COLORURINE YELLOW 12/12/2015 1416   APPEARANCEUR CLEAR 12/12/2015 1416   LABSPEC 1.024 12/12/2015 1416   PHURINE 6.0 12/12/2015 1416   GLUCOSEU NEGATIVE 12/12/2015 1416   HGBUR NEGATIVE 12/12/2015 1416   BILIRUBINUR NEGATIVE 12/12/2015 1416   KETONESUR NEGATIVE 12/12/2015 1416   PROTEINUR NEGATIVE 12/12/2015 1416   UROBILINOGEN 1.0 03/21/2013 2316   NITRITE NEGATIVE 12/12/2015 1416   LEUKOCYTESUR NEGATIVE 12/12/2015 1416   Sepsis Labs Invalid input(s): PROCALCITONIN,  WBC,  LACTICIDVEN Microbiology Recent Results (from the past 240 hour(s))  Blood culture (routine x 2)     Status: None (Preliminary result)   Collection Time: 10/11/18  9:53 AM  Result Value  Ref Range Status   Specimen Description BLOOD SITE NOT SPECIFIED  Final   Special Requests   Final    BOTTLES DRAWN AEROBIC AND ANAEROBIC Blood Culture adequate volume   Culture   Final    NO GROWTH 2 DAYS Performed at [facility], [address] [place], [place] [address]    Report Status PENDING  Incomplete  Blood culture (routine x 2)     Status: None (Preliminary result)   Collection Time: 10/11/18  9:53 AM  Result Value Ref Range Status   Specimen Description BLOOD SITE NOT SPECIFIED  Final   Special Requests   Final    BOTTLES DRAWN AEROBIC AND ANAEROBIC Blood Culture adequate volume   Culture   Final    NO GROWTH 2 DAYS Performed at [facility], [address], [place], [place] [address]    Report Status PENDING  Incomplete  MRSA PCR Screening     Status: None   Collection Time: 10/11/18  3:44 PM  Result Value Ref Range Status   MRSA by PCR NEGATIVE NEGATIVE Final    Comment:        The GeneXpert MRSA Assay (FDA approved for NASAL specimens only),  is one component of a comprehensive MRSA colonization surveillance program. It is not intended to diagnose MRSA infection nor to guide or monitor treatment for MRSA infections. Performed at [facility], [address] [place], [place] [address]      Time coordinating discharge in minutes: 65  SIGNED:   Calvert Cantor, MD  [facility] 10/14/2018, 10:00 AM Pager   If 7PM-7AM, please contact night-coverage [email or web address] Password [email or web address]

## 2018-10-13 NOTE — Consult Note (Signed)
[facility] CM Primary Care Navigator  10/13/2018  Luis Carter [birth year] [id number]   Went to seepatientin the room and met with daughter Joseph Art) as well, toidentify possible discharge needs.   Daughter reports that patient had switched to a primary care provider whom she sees and has been seen in their office in over a year. PCP is Dr. Nolene Ebbs ([place] [facility] provider and notan affiliate of [facility]).  No [facility] care management needs identifiable at this point.  Patientwas encouraged to follow-up with his primary care provider when hereturns back home in order togethelp in managing his health issues/ needs.   For additional questions please contact:  Edwena Felty A. Renleigh Ouellet, BSN, RN-BC [facility] PRIMARY CARE Navigator Cell: [phone]

## 2018-10-14 DIAGNOSIS — Z23 Encounter for immunization: Secondary | ICD-10-CM | POA: Diagnosis not present

## 2018-10-14 DIAGNOSIS — J181 Lobar pneumonia, unspecified organism: Secondary | ICD-10-CM

## 2018-10-14 DIAGNOSIS — J189 Pneumonia, unspecified organism: Secondary | ICD-10-CM

## 2018-10-14 LAB — BASIC METABOLIC PANEL
Anion gap: 10 (ref 5–15)
BUN: 29 mg/dL — ABNORMAL HIGH (ref 8–23)
CALCIUM: 8 mg/dL — AB (ref 8.9–10.3)
CO2: 22 mmol/L (ref 22–32)
Chloride: 108 mmol/L (ref 98–111)
Creatinine, Ser: 1.41 mg/dL — ABNORMAL HIGH (ref 0.61–1.24)
GFR calc Af Amer: 56 mL/min — ABNORMAL LOW (ref >60)
GFR calc non Af Amer: 49 mL/min — ABNORMAL LOW (ref >60)
Glucose, Bld: 260 mg/dL — ABNORMAL HIGH (ref 70–99)
Potassium: 4.5 mmol/L (ref 3.5–5.1)
Sodium: 140 mmol/L (ref 135–145)

## 2018-10-14 LAB — GLUCOSE, CAPILLARY
Glucose-Capillary: 42 mg/dL — CL (ref 70–99)
Glucose-Capillary: 259 mg/dL — ABNORMAL HIGH (ref 70–99)
Glucose-Capillary: 220 mg/dL — ABNORMAL HIGH (ref 70–99)

## 2018-10-14 LAB — CBC
HCT: 33.4 % — ABNORMAL LOW (ref 39.0–52.0)
Hemoglobin: 10.5 g/dL — ABNORMAL LOW (ref 13.0–17.0)
MCH: 27.3 pg (ref 26.0–34.0)
MCHC: 31.4 g/dL (ref 30.0–36.0)
MCV: 87 fL (ref 80.0–100.0)
Platelets: 222 10*3/uL (ref 150–400)
RBC: 3.84 MIL/uL — ABNORMAL LOW (ref 4.22–5.81)
RDW: 16 % — AB (ref 11.5–15.5)
WBC: 23.6 10*3/uL — ABNORMAL HIGH (ref 4.0–10.5)
nRBC: 0.1 % (ref 0.0–0.2)

## 2018-10-14 LAB — ALBUMIN: Albumin: 2.4 g/dL — ABNORMAL LOW (ref 3.5–5.0)

## 2018-10-14 MED ORDER — AZITHROMYCIN 250 MG PO TABS: 250.0000 mg | ORAL_TABLET | Freq: Every day | ORAL | 0 refills | Status: DC

## 2018-10-14 MED ORDER — DEXTROSE 50 % IV SOLN
INTRAVENOUS | Status: AC
  Filled 2018-10-14: qty 50

## 2018-10-14 MED ORDER — CEFUROXIME AXETIL 500 MG PO TABS: 500.0000 mg | ORAL_TABLET | Freq: Two times a day (BID) | ORAL | 0 refills | Status: AC

## 2018-10-14 NOTE — Care Management Note (Signed)
Case Management Note  Patient Details  Name: Luis Carter MRN: [id number] Date of Birth: July 17, 1946  Subjective/Objective:   For dc today, will need rolling walker, NCM contacted James for rolling walker, he will bring to patient's room.                  Action/Plan: DC home when walker received.   Expected Discharge Date:  10/13/18               Expected Discharge Plan:  Home/Self Care  In-House Referral:     Discharge planning Services  CM Consult  Post Acute Care Choice:  Durable Medical Equipment Choice offered to:  Patient  DME Arranged:  Dan Humphreys rolling DME Agency:  [facility]  HH Arranged:    [facility] Agency:     Status of Service:  Completed, signed off  If discussed at Long Length of Stay Meetings, dates discussed:    Additional Comments:  Leone Haven, RN 10/14/2018, 9:41 AM

## 2018-10-16 LAB — CULTURE, BLOOD (ROUTINE X 2)
Culture: NO GROWTH
Culture: NO GROWTH
SPECIAL REQUESTS: ADEQUATE
Special Requests: ADEQUATE

## 2018-11-10 DIAGNOSIS — E1142 Type 2 diabetes mellitus with diabetic polyneuropathy: Secondary | ICD-10-CM | POA: Diagnosis not present

## 2018-11-10 DIAGNOSIS — I10 Essential (primary) hypertension: Secondary | ICD-10-CM | POA: Diagnosis not present

## 2018-11-10 DIAGNOSIS — Z0000 Encounter for general adult medical examination without abnormal findings: Secondary | ICD-10-CM | POA: Diagnosis not present

## 2018-11-10 DIAGNOSIS — M1A9XX0 Chronic gout, unspecified, without tophus (tophi): Secondary | ICD-10-CM | POA: Diagnosis not present

## 2018-11-10 DIAGNOSIS — J449 Chronic obstructive pulmonary disease, unspecified: Secondary | ICD-10-CM | POA: Diagnosis not present

## 2018-11-10 DIAGNOSIS — J189 Pneumonia, unspecified organism: Secondary | ICD-10-CM | POA: Diagnosis not present

## 2018-11-10 DIAGNOSIS — E7849 Other hyperlipidemia: Secondary | ICD-10-CM | POA: Diagnosis not present

## 2018-12-06 DIAGNOSIS — M179 Osteoarthritis of knee, unspecified: Secondary | ICD-10-CM | POA: Diagnosis not present

## 2018-12-06 DIAGNOSIS — J449 Chronic obstructive pulmonary disease, unspecified: Secondary | ICD-10-CM | POA: Diagnosis not present

## 2018-12-06 DIAGNOSIS — I10 Essential (primary) hypertension: Secondary | ICD-10-CM | POA: Diagnosis not present

## 2018-12-06 DIAGNOSIS — E1142 Type 2 diabetes mellitus with diabetic polyneuropathy: Secondary | ICD-10-CM | POA: Diagnosis not present

## 2018-12-13 ENCOUNTER — Ambulatory Visit (HOSPITAL_COMMUNITY)
Admission: EM | Admit: 2018-12-13 | Discharge: 2018-12-13 | Disposition: A | Discharge status: Home / Self Care | Payer: Medicare Other | Attending: Family Medicine | Admitting: Family Medicine

## 2018-12-13 ENCOUNTER — Other Ambulatory Visit: Payer: Self-pay

## 2018-12-13 ENCOUNTER — Encounter (HOSPITAL_COMMUNITY): Payer: Self-pay

## 2018-12-13 DIAGNOSIS — R21 Rash and other nonspecific skin eruption: Secondary | ICD-10-CM | POA: Diagnosis not present

## 2018-12-13 MED ORDER — CETIRIZINE HCL 10 MG PO TABS: 10.0000 mg | ORAL_TABLET | Freq: Every day | ORAL | 1 refills | Status: DC

## 2018-12-13 MED ORDER — METHYLPREDNISOLONE ACETATE 40 MG/ML IJ SUSP
40.0000 mg | Freq: Once | INTRAMUSCULAR | Status: AC
  Administered 2018-12-13: 13:00:00 40 mg via INTRAMUSCULAR

## 2018-12-13 MED ORDER — METHYLPREDNISOLONE ACETATE 40 MG/ML IJ SUSP
INTRAMUSCULAR | Status: AC
  Filled 2018-12-13: qty 1

## 2018-12-13 NOTE — ED Provider Notes (Signed)
[facility]    CSN: [id number] Arrival date & time: 12/13/18  1154     History   Chief Complaint Chief Complaint  Patient presents with  . Joint Pain    HPI Luis Carter is a 73 y.o. male.   He is presenting with a rash.  He has had some itchiness in both of his arms as well as the back of his neck.  This is worse at night.  He has been taking Goody's powder with no improvement.  He does not have the pruritus through the course of the day.  He is denies any new or different medications.  No fevers or chills.  He was admitted in February with heart attack and pneumonia.  He is felt well until the past couple of days with this irritation.  Has cats at home but with no new other pets.  Denies any exposure to anyone with similar symptoms.  No travel.  Has little involvement on the anterior aspect of his lower legs.  HPI  Past Medical History:  Diagnosis Date  . Arthritis    "left arm/shoulder; both legs" (12/12/2015)  . ASTHMA    since childhood  . Cardiomyopathy (HCC) 01/25/2016   Echo 12/13/15 - Mild LVH, EF 45-50%, normal wall motion, grade 1 diastolic dysfunction, mild LAE, mild RVE, mild RAE  . COPD 04/14/2007  . Coronary artery disease   . DIABETES MELLITUS, TYPE II 04/14/2007  . GERD (gastroesophageal reflux disease)   . GI bleed   . Gout   . High cholesterol   . History of nuclear stress test    a.  Myoview 6/17: EF 54%, no ischemia  . HYPERTENSION 04/14/2007  . LEG PAIN, RIGHT 05/19/2008  . LOW BACK PAIN 04/14/2007  . Myocardial infarction (HCC)    ~ 2001/notes 09/13/2001 (03/22/2013)  . PANCREATITIS, HX OF 04/14/2007  . Pneumonia    "twice" (12/12/2015)  . SEIZURE DISORDER 04/14/2007   "used to have them when I was young" (03/22/2013)  . Shortness of breath    "related to asthma" (03/22/2013)    Patient Active Problem List   Diagnosis Date Noted  . Community acquired pneumonia of right upper lobe of lung (HCC)   . Respiratory failure with hypoxia and  hypercapnia (HCC) 10/11/2018  . Acute pain of left shoulder 10/15/2016  . Pain in right leg 10/15/2016  . Weakness of right leg 10/15/2016  . Cardiomyopathy (HCC) 01/25/2016  . Chronic renal disease, stage III (HCC) 12/13/2015  . Abnormal echocardiogram 12/13/2015  . Syncope 12/12/2015  . Tobacco use disorder 09/07/2015  . Gout 01/27/2012  . Cocaine use 12/22/2011  . 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 Essential hypertension 04/14/2007  . Asthma 04/14/2007  . COPD (chronic obstructive pulmonary disease) (HCC) 04/14/2007  . 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 Seizures (HCC) 04/14/2007  . PANCREATITIS, HX OF 04/14/2007    Past Surgical History:  Procedure Laterality Date  . CORONARY ANGIOPLASTY WITH STENT PLACEMENT    . ESOPHAGOGASTRODUODENOSCOPY  12/21/2011   Procedure: ESOPHAGOGASTRODUODENOSCOPY (EGD);  Surgeon: Louis Meckelobert D Kaplan, MD;  [facility];  Service: Endoscopy;  Laterality: N/A;       Home Medications    Prior to Admission medications   Medication Sig Start Date End Date Taking? Authorizing Provider  albuterol (PROAIR HFA) 108 (90 BASE) MCG/ACT inhaler INHALE 2 PUFFS INTO THE LUNGS EVERY 6 (SIX) HOURS AS NEEDED FOR WHEEZING. Patient taking differently: Inhale 2 puffs  into the lungs every 6 (six) hours as needed. AS NEEDED FOR WHEEZING. 03/05/15   Gordy Savers, MD  allopurinol (ZYLOPRIM) 100 MG tablet Take 2 tablets (200 mg total) by mouth daily. 03/05/15   Gordy Savers, MD  amLODipine (NORVASC) 5 MG tablet TAKE 1 TABLET BY MOUTH EVERY DAY Patient not taking: No sig reported 06/02/16   Gordy Savers, MD  aspirin ([employer] ASPIRIN LOW DOSE) 81 MG EC tablet Take 1 tablet (81 mg total) by mouth daily. Swallow whole. 12/17/16   Gordy Savers, MD  atorvastatin (LIPITOR) 20 MG tablet TAKE 1 TABLET BY MOUTH EVERY DAY Patient taking differently: Take 20 mg by mouth daily at 6 PM.  06/02/16   Gordy Savers, MD  azithromycin (ZITHROMAX) 250 MG tablet Take 1 tablet (250 mg total) by mouth daily. 10/14/18   Calvert Cantor, MD  cetirizine (ZYRTEC) 10 MG tablet Take 1 tablet (10 mg total) by mouth daily. 12/13/18   Myra Rude, MD  colchicine (COLCRYS) 0.6 MG tablet TAKE 1 TABLET BY MOUTH TWICE A DAY AS NEEDED FOR GOUT FLARE-UPS Patient not taking: Reported on 10/11/2018 03/05/15   Gordy Savers, MD  diclofenac sodium (VOLTAREN) 1 % GEL APPLY TO AFFECTED AREA 4 TIMES A DAY Patient not taking: Reported on 10/11/2018 09/04/14   Gordy Savers, MD  Fluticasone-Salmeterol (ADVAIR) 250-50 MCG/DOSE AEPB Inhale 1 puff into the lungs 2 (two) times daily.    [provider]  HYDROcodone-acetaminophen (NORCO) 7.5-325 MG tablet Take 1 tablet by mouth every 4 (four) hours as needed for moderate pain. 10/13/18   Calvert Cantor, MD  lisinopril (PRINIVIL,ZESTRIL) 20 MG tablet Take 1 tablet (20 mg total) by mouth daily. 03/05/15   Gordy Savers, MD  metFORMIN (GLUCOPHAGE) 500 MG tablet Take 1 tablet (500 mg total) by mouth 2 (two) times daily. 10/13/18   Calvert Cantor, MD  montelukast (SINGULAIR) 10 MG tablet Take 1 tablet (10 mg total) by mouth at bedtime. Patient not taking: Reported on 10/11/2018 03/05/15   Gordy Savers, MD  montelukast (SINGULAIR) 10 MG tablet TAKE 1 TABLET BY MOUTH AT BEDTIME Patient taking differently: Take 10 mg by mouth at bedtime.  06/02/16   Gordy Savers, MD  omeprazole (PRILOSEC) 20 MG capsule Take 1 capsule (20 mg total) by mouth daily. 03/05/15   Gordy Savers, MD  triamcinolone cream (KENALOG) 0.1 % Apply 1 application topically 2 (two) times daily. Patient not taking: Reported on 10/11/2018 03/05/15   Gordy Savers, MD  Vitamin D, Ergocalciferol, (DRISDOL) 50000 units CAPS capsule Take 50,000 Units by mouth once a week. Monday 05/14/17   [provider]    Family History Family History  Problem Relation Age of Onset  .  Heart disease Sister   . Heart disease Brother   . Heart disease Brother   . Heart disease Brother   . Heart disease Brother     Social History Social History   Tobacco Use  . Smoking status: Former Smoker    Packs/day: 0.25    Years: 57.00    Pack years: 14.25    Types: Cigarettes    Last attempt to quit: 11/24/2015    Years since quitting: 3.0  . Smokeless tobacco: Never Used  Substance Use Topics  . Alcohol use: Yes    Comment: 12/12/2015 "quit drinking years ago; never had problem w/it"  . Drug use: No     Allergies   Patient has no  known allergies.   Review of Systems Review of Systems  Constitutional: Negative for fever.  HENT: Negative for congestion.   Respiratory: Negative for cough.   Cardiovascular: Negative for chest pain.  Gastrointestinal: Negative for abdominal pain.  Musculoskeletal: Positive for arthralgias and gait problem.  Skin: Negative for color change.  Neurological: Negative for weakness.  Hematological: Negative for adenopathy.     Physical Exam Triage Vital Signs ED Triage Vitals  Enc Vitals Group     BP 12/13/18 1213 (!) 181/85     Pulse Rate 12/13/18 1213 70     Resp 12/13/18 1213 18     Temp 12/13/18 1213 97.8 F (36.6 C)     Temp src --      SpO2 12/13/18 1213 97 %     Weight 12/13/18 1211 170 lb (77.1 kg)     Height --      Head Circumference --      Peak Flow --      Pain Score 12/13/18 1210 1     Pain Loc --      Pain Edu? --      Excl. in GC? --    No data found.  Updated Vital Signs BP (!) 181/85 (BP Location: Right Arm)   Pulse 70   Temp 97.8 F (36.6 C)   Resp 18   Wt 77.1 kg   SpO2 97%   BMI 25.10 kg/m   Visual Acuity Right Eye Distance:   Left Eye Distance:   Bilateral Distance:    Right Eye Near:   Left Eye Near:    Bilateral Near:     Physical Exam Gen: NAD, alert, cooperative with exam, well-appearing ENT: normal lips, normal nasal mucosa,  Eye: normal EOM, normal conjunctiva and lids CV:   no edema, +2 pedal pulses   Resp: no accessory muscle use, non-labored,  GI: no masses or tenderness, no hernia  Skin: Maculopapular rash occurring in his upper extremities in the posterior aspect the different stages and fairly spread apart.  No erythema.  Mild occurring on his neck. Neuro: normal tone, normal sensation to touch Psych:  normal insight, alert and oriented MSK: normal strength   UC Treatments / Results  Labs (all labs ordered are listed, but only abnormal results are displayed) Labs Reviewed - No data to display  EKG None  Radiology No results found.  Procedures Procedures (including critical care time)  Medications Ordered in UC Medications  methylPREDNISolone acetate (DEPO-MEDROL) injection 40 mg (40 mg Intramuscular Given 12/13/18 1248)    Initial Impression / Assessment and Plan / UC Course  I have reviewed the triage vital signs and the nursing notes.  Pertinent labs & imaging results that were available during my care of the patient were reviewed by me and considered in my medical decision making (see chart for details).     Kinard is a 73 year old male that is presenting with rash.  It is pruritic in nature.  Denies any instigating or inciting event.  Has not had improvement with over-the-counter modalities.  Will provide with an intramuscular Depo-Medrol injection.  Sent a prescription of Zyrtec to take.  Counseled on supportive care.  Given indications to follow-up.  Final Clinical Impressions(s) / UC Diagnoses   Final diagnoses:  Rash     Discharge Instructions     Please try the zyrtec on a nightly basis.  Please try to avoid putting alcohol on your skin  Please follow up if your symptoms fail to  improve.     ED Prescriptions    Medication Sig Dispense Auth. Provider   cetirizine (ZYRTEC) 10 MG tablet Take 1 tablet (10 mg total) by mouth daily. 30 tablet Myra Rude, MD     Controlled Substance Prescriptions [place] Controlled  Substance Registry consulted? Not Applicable   Myra Rude, MD 12/13/18 1349

## 2018-12-13 NOTE — ED Triage Notes (Signed)
Pt cc he says for 3 days he had been getting  pain in his arm and leg on the right side. Pt  Has been taking goodie power for the pain. Pt states that's not working. This happens at night and when it starts the pain is a 9. Pt states his skin starts to itch a lot at night. But he doesn't he a rash.

## 2018-12-13 NOTE — Discharge Instructions (Signed)
Please try the zyrtec on a nightly basis.  Please try to avoid putting alcohol on your skin  Please follow up if your symptoms fail to improve.

## 2019-01-04 ENCOUNTER — Other Ambulatory Visit: Payer: Self-pay | Admitting: Family Medicine

## 2019-01-07 DIAGNOSIS — M179 Osteoarthritis of knee, unspecified: Secondary | ICD-10-CM | POA: Diagnosis not present

## 2019-01-07 DIAGNOSIS — I10 Essential (primary) hypertension: Secondary | ICD-10-CM | POA: Diagnosis not present

## 2019-01-07 DIAGNOSIS — J449 Chronic obstructive pulmonary disease, unspecified: Secondary | ICD-10-CM | POA: Diagnosis not present

## 2019-01-07 DIAGNOSIS — E1142 Type 2 diabetes mellitus with diabetic polyneuropathy: Secondary | ICD-10-CM | POA: Diagnosis not present

## 2019-01-19 ENCOUNTER — Telehealth: Payer: Self-pay

## 2019-01-19 NOTE — Telephone Encounter (Signed)
Virtual Visit Pre-Appointment Phone Call  "(Name), I am calling you today to discuss your upcoming appointment. We are currently trying to limit exposure to the virus that causes COVID-19 by seeing patients at home rather than in the office."  1. "What is the BEST phone number to call the day of the visit?" - include this in appointment notes  2. "Do you have or have access to (through a family member/friend) a smartphone with video capability that we can use for your visit?" a. If yes - list this number in appt notes as "cell" (if different from BEST phone #) and list the appointment type as a VIDEO visit in appointment notes b. If no - list the appointment type as a PHONE visit in appointment notes  3. Confirm consent - "In the setting of the current Covid19 crisis, you are scheduled for a (phone or video) visit with your provider on (date) at (time).  Just as we do with many in-office visits, in order for you to participate in this visit, we must obtain consent.  If you'd like, I can send this to your mychart (if signed up) or email for you to review.  Otherwise, I can obtain your verbal consent now.  All virtual visits are billed to your insurance company just like a normal visit would be.  By agreeing to a virtual visit, we'd like you to understand that the technology does not allow for your provider to perform an examination, and thus may limit your provider's ability to fully assess your condition. If your provider identifies any concerns that need to be evaluated in person, we will make arrangements to do so.  Finally, though the technology is pretty good, we cannot assure that it will always work on either your or our end, and in the setting of a video visit, we may have to convert it to a phone-only visit.  In either situation, we cannot ensure that we have a secure connection.  Are you willing to proceed?" yes  4. Advise patient to be prepared - "Two hours prior to your appointment, go  ahead and check your blood pressure, pulse, oxygen saturation, and your weight (if you have the equipment to check those) and write them all down. When your visit starts, your provider will ask you for this information. If you have an Apple Watch or Kardia device, please plan to have heart rate information ready on the day of your appointment. Please have a pen and paper handy nearby the day of the visit as well."  5. Give patient instructions for MyChart download to smartphone OR [email or web address] as below if video visit (depending on what platform provider is using)  6. Inform patient they will receive a phone call 15 minutes prior to their appointment time (may be from unknown caller ID) so they should be prepared to answer    TELEPHONE CALL NOTE  Luis Carter has been deemed a candidate for a follow-up tele-health visit to limit community exposure during the Covid-19 pandemic. I spoke with the patient via phone to ensure availability of phone/video source, confirm preferred email & phone number, and discuss instructions and expectations.  I reminded Luis Carter to be prepared with any vital sign and/or heart rhythm information that could potentially be obtained via home monitoring, at the time of his visit. I reminded Luis Carter to expect a phone call prior to his visit.  Jacqlyn Krauss, [facility] 01/19/2019 3:51 PM   INSTRUCTIONS  FOR DOWNLOADING THE MYCHART APP TO SMARTPHONE  - The patient must first make sure to have activated MyChart and know their login information - If Apple, go to [employer] and type in MyChart in the search bar and download the app. If Android, ask patient to go to [employer] and type in [place] in the search bar and download the app. The app is free but as with any other app downloads, their phone may require them to verify saved payment information or Apple/Android password.  - The patient will need to then log into the app with their MyChart  username and password, and select  as their healthcare provider to link the account. When it is time for your visit, go to the MyChart app, find appointments, and click Begin Video Visit. Be sure to Select Allow for your device to access the Microphone and Camera for your visit. You will then be connected, and your provider will be with you shortly.  **If they have any issues connecting, or need assistance please contact MyChart service desk [phone])**  **If using a computer, in order to ensure the best quality for their visit they will need to use either of the following Internet Browsers: [employer], or Google Chrome**  IF USING DOXIMITY or DOXY.ME - The patient will receive a link just prior to their visit by text.     FULL LENGTH CONSENT FOR TELE-HEALTH VISIT   I hereby voluntarily request, consent and authorize [facility] and its employed or contracted physicians, physician assistants, nurse practitioners or other licensed health care professionals (the Practitioner), to provide me with telemedicine health care services (the "Services") as deemed necessary by the treating Practitioner. I acknowledge and consent to receive the Services by the Practitioner via telemedicine. I understand that the telemedicine visit will involve communicating with the Practitioner through live audiovisual communication technology and the disclosure of certain medical information by electronic transmission. I acknowledge that I have been given the opportunity to request an in-person assessment or other available alternative prior to the telemedicine visit and am voluntarily participating in the telemedicine visit.  I understand that I have the right to withhold or withdraw my consent to the use of telemedicine in the course of my care at any time, without affecting my right to future care or treatment, and that the Practitioner or I may terminate the telemedicine visit at any  time. I understand that I have the right to inspect all information obtained and/or recorded in the course of the telemedicine visit and may receive copies of available information for a reasonable fee.  I understand that some of the potential risks of receiving the Services via telemedicine include:  Marland Kitchen Delay or interruption in medical evaluation due to technological equipment failure or disruption; . Information transmitted may not be sufficient (e.g. poor resolution of images) to allow for appropriate medical decision making by the Practitioner; and/or  . In rare instances, security protocols could fail, causing a breach of personal health information.  Furthermore, I acknowledge that it is my responsibility to provide information about my medical history, conditions and care that is complete and accurate to the best of my ability. I acknowledge that Practitioner's advice, recommendations, and/or decision may be based on factors not within their control, such as incomplete or inaccurate data provided by me or distortions of diagnostic images or specimens that may result from electronic transmissions. I understand that the practice of medicine is not an exact science and  that Practitioner makes no warranties or guarantees regarding treatment outcomes. I acknowledge that I will receive a copy of this consent concurrently upon execution via email to the email address I last provided but may also request a printed copy by calling the office of [facility].    I understand that my insurance will be billed for this visit.   I have read or had this consent read to me. . I understand the contents of this consent, which adequately explains the benefits and risks of the Services being provided via telemedicine.  . I have been provided ample opportunity to ask questions regarding this consent and the Services and have had my questions answered to my satisfaction. . I give my informed consent for the services  to be provided through the use of telemedicine in my medical care  By participating in this telemedicine visit I agree to the above.

## 2019-01-20 NOTE — Progress Notes (Signed)
Virtual Visit via Telephone Note   This visit type was conducted due to national recommendations for restrictions regarding the COVID-19 Pandemic (e.g. social distancing) in an effort to limit this patient's exposure and mitigate transmission in our community.  Due to his co-morbid illnesses, this patient is at least at moderate risk for complications without adequate follow up.  This format is felt to be most appropriate for this patient at this time.  The patient did not have access to video technology/had technical difficulties with video requiring transitioning to audio format only (telephone).  All issues noted in this document were discussed and addressed.  No physical exam could be performed with this format.  Please refer to the patient's chart for his  consent to telehealth for [facility].   Date:  01/21/2019   ID:  Luis Carter, DOB [birth year], MRN [id number]  Patient Location: Home Provider Location: Home  PCP:  Fleet Contras, MD  Cardiologist:  Dr. Verdis Prime, MD   Evaluation Performed:  Follow-Up Visit  Chief Complaint: Follow-up for HTN, hx of syncope, seen for Dr. Katrinka Blazing  History of Present Illness:    Luis Carter is a 73 y.o. male with a hx of diabetes, HTN, CKD, HL, COPD, and cocaine abuse. Patient has a history of possible myocardial infarction in 2001.   [name] had an elevated CK-MB in which a nuclear stress test was performed and found to be negative. He has never undergone cardiac catheterization. Repeat nuclear stress test in 2010 was low risk. Admitted 4/19-4/21 with syncope. History was suggestive of post micturition syncope. Echocardiogram demonstrated EF 45-50% with no regional wall motion abnormalities and G1 DD. He was noted to have bradycardia with heart rates <60. Outpatient 30 day event monitor was recommended. Available reports from the event monitor demonstrated normal sinus rhythm with PVCs and no significant bradycardia arrhythmias.   He was last seen in follow-up 01/25/2016 for follow-up of syncope. He had no recurrent symptoms however noted dyspnea on exertion without chest discomfort, orthopnea or PND.  He was continued on his ACEI.  No beta-blocker in the setting of bradycardia.  Given his symptoms of dyspnea with significant risk factors for CAD including diabetes, case was discussed with Dr. Katrinka Blazing in which he was set up for an additional stress test to rule out ischemia. This was performed 02/08/2016. There was no ST segment deviation during stress, considered a low risk study with no further evaluation needed.  He was recently hospitalized 10/13/2018 for respiratory failure with hypoxia, CAP and COPD exacerbation. Cardiology was not consulted. He was discharged home on Ceftin and Zithromax and inhalers. He continues to smoke.  Today [name] is doing well from a cardiac perspective. He denies chest pain or other anginal symptoms. He was recently hospitalized 09/2018 with COPD exacerbation/CAP as above. Reports breathing is fine.  Lives at home with his son who is very involved with his care.  He denies shortness of breath, LE swelling, orthopnea, palpitations, recurrent syncope, or dizziness.  He was recently seen in follow-up by his PCP after his hospitalization and was doing well.  He does not have a blood pressure cuff at home however, this was ordered by his PCP and should be arriving to their house next week.  Son will start taking BP on a regular basis.  To report for several readings to our office for possible med adjustment if needed.  Discussed ASA use.  Has no documented coronary artery disease however has a history  of cardiomyopathy and risk factors for CAD and therefore plan is to continue ASA therapy.   The patient does not have symptoms concerning for COVID-19 infection (fever, chills, cough, or new shortness of breath).   Past Medical History:  Diagnosis Date  . Arthritis    "left arm/shoulder; both legs"  (12/12/2015)  . ASTHMA    since childhood  . Cardiomyopathy (HCC) 01/25/2016   Echo 12/13/15 - Mild LVH, EF 45-50%, normal wall motion, grade 1 diastolic dysfunction, mild LAE, mild RVE, mild RAE  . COPD 04/14/2007  . Coronary artery disease   . DIABETES MELLITUS, TYPE II 04/14/2007  . GERD (gastroesophageal reflux disease)   . GI bleed   . Gout   . High cholesterol   . History of nuclear stress test    a.  Myoview 6/17: EF 54%, no ischemia  . HYPERTENSION 04/14/2007  . LEG PAIN, RIGHT 05/19/2008  . LOW BACK PAIN 04/14/2007  . Myocardial infarction (HCC)    ~ 2001/notes 09/13/2001 (03/22/2013)  . PANCREATITIS, HX OF 04/14/2007  . Pneumonia    "twice" (12/12/2015)  . SEIZURE DISORDER 04/14/2007   "used to have them when I was young" (03/22/2013)  . Shortness of breath    "related to asthma" (03/22/2013)   Past Surgical History:  Procedure Laterality Date  . CORONARY ANGIOPLASTY WITH STENT PLACEMENT    . ESOPHAGOGASTRODUODENOSCOPY  12/21/2011   Procedure: ESOPHAGOGASTRODUODENOSCOPY (EGD);  Surgeon: Louis Meckelobert D Kaplan, MD;  [facility];  Service: Endoscopy;  Laterality: N/A;     Current Meds  Medication Sig  . albuterol (PROAIR HFA) 108 (90 BASE) MCG/ACT inhaler INHALE 2 PUFFS INTO THE LUNGS EVERY 6 (SIX) HOURS AS NEEDED FOR WHEEZING. (Patient taking differently: Inhale 2 puffs into the lungs every 6 (six) hours as needed. AS NEEDED FOR WHEEZING.)  . allopurinol (ZYLOPRIM) 100 MG tablet Take 2 tablets (200 mg total) by mouth daily.  Marland Kitchen. amLODipine (NORVASC) 5 MG tablet TAKE 1 TABLET BY MOUTH EVERY DAY  . aspirin ([employer] ASPIRIN LOW DOSE) 81 MG EC tablet Take 1 tablet (81 mg total) by mouth daily. Swallow whole.  Marland Kitchen. atorvastatin (LIPITOR) 20 MG tablet TAKE 1 TABLET BY MOUTH EVERY DAY (Patient taking differently: Take 20 mg by mouth daily at 6 PM. )  . azithromycin (ZITHROMAX) 250 MG tablet Take 1 tablet (250 mg total) by mouth daily.  . cetirizine (ZYRTEC) 10 MG tablet Take 1 tablet (10 mg  total) by mouth daily.  . colchicine (COLCRYS) 0.6 MG tablet TAKE 1 TABLET BY MOUTH TWICE A DAY AS NEEDED FOR GOUT FLARE-UPS  . diclofenac sodium (VOLTAREN) 1 % GEL APPLY TO AFFECTED AREA 4 TIMES A DAY  . Fluticasone-Salmeterol (ADVAIR) 250-50 MCG/DOSE AEPB Inhale 1 puff into the lungs 2 (two) times daily.  Marland Kitchen. lisinopril (PRINIVIL,ZESTRIL) 20 MG tablet Take 1 tablet (20 mg total) by mouth daily.  . metFORMIN (GLUCOPHAGE) 500 MG tablet Take 1 tablet (500 mg total) by mouth 2 (two) times daily.  . montelukast (SINGULAIR) 10 MG tablet Take 1 tablet (10 mg total) by mouth at bedtime.  . montelukast (SINGULAIR) 10 MG tablet TAKE 1 TABLET BY MOUTH AT BEDTIME (Patient taking differently: Take 10 mg by mouth at bedtime. )  . omeprazole (PRILOSEC) 20 MG capsule Take 1 capsule (20 mg total) by mouth daily.  Marland Kitchen. triamcinolone cream (KENALOG) 0.1 % Apply 1 application topically 2 (two) times daily.  . Vitamin D, Ergocalciferol, (DRISDOL) 50000 units CAPS capsule Take 50,000 Units by mouth  once a week. Monday  . [DISCONTINUED] HYDROcodone-acetaminophen (NORCO) 7.5-325 MG tablet Take 1 tablet by mouth every 4 (four) hours as needed for moderate pain.    Allergies:   Patient has no known allergies.   Social History   Tobacco Use  . Smoking status: Former Smoker    Packs/day: 0.25    Years: 57.00    Pack years: 14.25    Types: Cigarettes    Last attempt to quit: 11/24/2015    Years since quitting: 3.1  . Smokeless tobacco: Never Used  Substance Use Topics  . Alcohol use: Yes    Comment: 12/12/2015 "quit drinking years ago; never had problem w/it"  . Drug use: No    Family Hx: The patient's family history includes Heart disease in his brother, brother, brother, brother, and sister.  ROS:   Please see the history of present illness.     All other systems reviewed and are negative.  Prior CV studies:   The following studies were reviewed today:  Lexiscan stress test 02/08/2016:   Nuclear stress  EF: 54%.  There was no ST segment deviation noted during stress.  The study is normal.  This is a low risk study.  The left ventricular ejection fraction is mildly decreased (45-54%).   Diaphragmatic attenuation no ischemia or infarct EF 54%   Echo 12/13/15 Mild LVH, EF 45-50%, normal wall motion, grade 1 diastolic dysfunction, mild LAE, mild RVE, mild RAE  Myoview 11/2008 EF 59%, no ischemia  Labs/Other Tests and Data Reviewed:    EKG:  An ECG dated Sinus tachycardia with PVCs was personally reviewed today and demonstrated:  10/11/2018  Recent Labs: 10/11/2018: ALT 13; B Natriuretic Peptide 200.8 10/14/2018: BUN 29; Creatinine, Ser 1.41; Hemoglobin 10.5; Platelets 222; Potassium 4.5; Sodium 140   Recent Lipid Panel Lab Results  Component Value Date/Time   CHOL 117 09/07/2015 09:23 AM   TRIG 54.0 09/07/2015 09:23 AM   HDL 45.80 09/07/2015 09:23 AM   CHOLHDL 3 09/07/2015 09:23 AM   LDLCALC 60 09/07/2015 09:23 AM   Wt Readings from Last 3 Encounters:  01/21/19 170 lb (77.1 kg)  12/13/18 170 lb (77.1 kg)  10/11/18 170 lb (77.1 kg)    Objective:    Vital Signs:  Ht 5\' 9"  (1.753 m)   Wt 170 lb (77.1 kg)   BMI 25.10 kg/m    VITAL SIGNS:  reviewed GEN:  no acute distress RESPIRATORY:  normal respiratory effort, symmetric expansion NEURO:  alert and oriented x 3, no obvious focal deficit PSYCH:  normal affect  ASSESSMENT & PLAN:    1.  History of syncope: -Patient was last seen 01/25/2016 in follow-up for syncope thought to be consistent with post micturition syncope.  Echocardiogram showed LVEF 45 to 50%.  He has chronic bradycardia in which there was concern for sinus node dysfunction  -Outpatient event monitor showed sinus rhythm with PVCs and no significant bradycardia arrhythmias -There was no further work-up needed at that time -Denies recurrent symptoms, no dizziness or syncope   2.  Cardiomyopathy: -As stated above, LVEF of 45 to 50% per last echocardiogram  with nuclear stress test in 2010 with EF of 59%.  Wall motion was normal. -No BB in the setting of bradycardia -Last nuclear stress test 02/08/2016, no ischemia noted -Continue ASA, lisinopril, statin -Patient is asymptomatic at this time. Consider repeat echocardiogram in the future however given COVID-19 this can wait until later date  3.  HTN: -Unable to assess as patient  does not have home BP cuff -PCP ordered and to arrive next week.  Son states he will report initial BPs to our office -Home BP technique reviewed with patient and son -Continue amlodipine 5, lisinopril 20 -Creatinine elevated on last hospitalization at 1.41 which appears to be below his baseline   4.  COPD: -Recent hospitalization for COPD exacerbation and CAP -Doing better without shortness of breath   5.  DM2: -Managed per PCP -Last hemoglobin A1c, 5.9 on 10/11/2018   6.  Tobacco use: -Smoking cessation strongly encouraged   COVID-19 Education: The signs and symptoms of COVID-19 were discussed with the patient and how to seek care for testing (follow up with PCP or arrange E-visit). The importance of social distancing was discussed today.  Time:   Today, I have spent 20 minutes with the patient with telehealth technology discussing the above problems.     Medication Adjustments/Labs and Tests Ordered: Current medicines are reviewed at length with the patient today.  Concerns regarding medicines are outlined above.   Tests Ordered: No orders of the defined types were placed in this encounter.   Medication Changes: No orders of the defined types were placed in this encounter.   Disposition:  Follow up Follow-up with Dr. Katrinka Blazing in 6 months or sooner if needed  Signed, Georgie Chard, NP  01/21/2019 8:54 AM    [facility]

## 2019-01-21 ENCOUNTER — Telehealth (INDEPENDENT_AMBULATORY_CARE_PROVIDER_SITE_OTHER): Payer: Medicare Other | Admitting: Cardiology

## 2019-01-21 ENCOUNTER — Other Ambulatory Visit: Payer: Self-pay

## 2019-01-21 ENCOUNTER — Encounter: Payer: Self-pay | Admitting: Cardiology

## 2019-01-21 VITALS — Ht 69.0 in | Wt 170.0 lb

## 2019-01-21 DIAGNOSIS — I10 Essential (primary) hypertension: Secondary | ICD-10-CM

## 2019-01-21 DIAGNOSIS — F17200 Nicotine dependence, unspecified, uncomplicated: Secondary | ICD-10-CM

## 2019-01-21 DIAGNOSIS — R55 Syncope and collapse: Secondary | ICD-10-CM

## 2019-01-21 DIAGNOSIS — J441 Chronic obstructive pulmonary disease with (acute) exacerbation: Secondary | ICD-10-CM

## 2019-01-21 NOTE — Patient Instructions (Addendum)
Medication Instructions:  Your physician recommends that you continue on your current medications as directed. Please refer to the Current Medication list given to you today.  If you need a refill on your cardiac medications before your next appointment, please call your pharmacy.   Lab work: None ordered  If you have labs (blood work) drawn today and your tests are completely normal, you will receive your results only by: . MyChart Message (if you have MyChart) OR . A paper copy in the mail If you have any lab test that is abnormal or we need to change your treatment, we will call you to review the results.  Testing/Procedures: None ordered  Follow-Up: At [facility], you and your health needs are our priority.  As part of our continuing mission to provide you with exceptional heart care, we have created designated Provider Care Teams.  These Care Teams include your primary Cardiologist (physician) and Advanced Practice Providers (APPs -  Physician Assistants and Nurse Practitioners) who all work together to provide you with the care you need, when you need it. You will need a follow up appointment in 6 months.  Please call our office 2 months in advance to schedule this appointment.  You may see Dr. Smith or one of the following Advanced Practice Providers on your designated Care Team:   Lori Gerhardt, NP Laura Ingold, NP . Jill McDaniel, NP  Any Other Special Instructions Will Be Listed Below (If Applicable).   . 

## 2019-02-10 ENCOUNTER — Other Ambulatory Visit: Payer: Self-pay

## 2019-02-10 NOTE — Patient Outreach (Signed)
[facility]) Care Management  02/10/2019  [name] [birth year] [id number]   Medication Adherence call to [name] [facility] Identifiers Verify spoke with patient he is due on Atorvastatin 20 mg and Metformin 500 mg patient explain he is taking both medication and has ask [facility] to transfer both medications from [facility] and had not transfer patient had ask almost 2 weeks ago.call [facility] they said they will transfer both medications and will call the patient once there ready.[name] is showing past due under [place].   [place] Management Direct Dial [phone]  Fax [phone] [email or web address]

## 2019-02-18 DIAGNOSIS — I10 Essential (primary) hypertension: Secondary | ICD-10-CM | POA: Diagnosis not present

## 2019-02-18 DIAGNOSIS — J449 Chronic obstructive pulmonary disease, unspecified: Secondary | ICD-10-CM | POA: Diagnosis not present

## 2019-02-18 DIAGNOSIS — M179 Osteoarthritis of knee, unspecified: Secondary | ICD-10-CM | POA: Diagnosis not present

## 2019-02-18 DIAGNOSIS — N183 Chronic kidney disease, stage 3 (moderate): Secondary | ICD-10-CM | POA: Diagnosis not present

## 2019-02-18 DIAGNOSIS — E1142 Type 2 diabetes mellitus with diabetic polyneuropathy: Secondary | ICD-10-CM | POA: Diagnosis not present

## 2019-03-11 DIAGNOSIS — J449 Chronic obstructive pulmonary disease, unspecified: Secondary | ICD-10-CM | POA: Diagnosis not present

## 2019-03-11 DIAGNOSIS — L259 Unspecified contact dermatitis, unspecified cause: Secondary | ICD-10-CM | POA: Diagnosis not present

## 2019-03-11 DIAGNOSIS — I10 Essential (primary) hypertension: Secondary | ICD-10-CM | POA: Diagnosis not present

## 2019-03-11 DIAGNOSIS — E1142 Type 2 diabetes mellitus with diabetic polyneuropathy: Secondary | ICD-10-CM | POA: Diagnosis not present

## 2019-03-11 DIAGNOSIS — Z23 Encounter for immunization: Secondary | ICD-10-CM | POA: Diagnosis not present

## 2019-04-01 DIAGNOSIS — J449 Chronic obstructive pulmonary disease, unspecified: Secondary | ICD-10-CM | POA: Diagnosis not present

## 2019-04-01 DIAGNOSIS — E1142 Type 2 diabetes mellitus with diabetic polyneuropathy: Secondary | ICD-10-CM | POA: Diagnosis not present

## 2019-04-01 DIAGNOSIS — N183 Chronic kidney disease, stage 3 (moderate): Secondary | ICD-10-CM | POA: Diagnosis not present

## 2019-04-01 DIAGNOSIS — I10 Essential (primary) hypertension: Secondary | ICD-10-CM | POA: Diagnosis not present

## 2019-06-03 DIAGNOSIS — M179 Osteoarthritis of knee, unspecified: Secondary | ICD-10-CM | POA: Diagnosis not present

## 2019-06-03 DIAGNOSIS — I10 Essential (primary) hypertension: Secondary | ICD-10-CM | POA: Diagnosis not present

## 2019-06-03 DIAGNOSIS — M6240 Contracture of muscle, unspecified site: Secondary | ICD-10-CM | POA: Diagnosis not present

## 2019-06-03 DIAGNOSIS — J449 Chronic obstructive pulmonary disease, unspecified: Secondary | ICD-10-CM | POA: Diagnosis not present

## 2019-06-03 DIAGNOSIS — E1142 Type 2 diabetes mellitus with diabetic polyneuropathy: Secondary | ICD-10-CM | POA: Diagnosis not present

## 2019-07-06 DIAGNOSIS — I10 Essential (primary) hypertension: Secondary | ICD-10-CM | POA: Diagnosis not present

## 2019-07-06 DIAGNOSIS — M179 Osteoarthritis of knee, unspecified: Secondary | ICD-10-CM | POA: Diagnosis not present

## 2019-07-06 DIAGNOSIS — J449 Chronic obstructive pulmonary disease, unspecified: Secondary | ICD-10-CM | POA: Diagnosis not present

## 2019-07-06 DIAGNOSIS — E1142 Type 2 diabetes mellitus with diabetic polyneuropathy: Secondary | ICD-10-CM | POA: Diagnosis not present

## 2019-08-31 DIAGNOSIS — J449 Chronic obstructive pulmonary disease, unspecified: Secondary | ICD-10-CM | POA: Diagnosis not present

## 2019-08-31 DIAGNOSIS — E1142 Type 2 diabetes mellitus with diabetic polyneuropathy: Secondary | ICD-10-CM | POA: Diagnosis not present

## 2019-08-31 DIAGNOSIS — I10 Essential (primary) hypertension: Secondary | ICD-10-CM | POA: Diagnosis not present

## 2019-08-31 DIAGNOSIS — M5430 Sciatica, unspecified side: Secondary | ICD-10-CM | POA: Diagnosis not present

## 2019-09-12 DIAGNOSIS — M179 Osteoarthritis of knee, unspecified: Secondary | ICD-10-CM | POA: Diagnosis not present

## 2019-09-12 DIAGNOSIS — I10 Essential (primary) hypertension: Secondary | ICD-10-CM | POA: Diagnosis not present

## 2019-09-12 DIAGNOSIS — M1A9XX0 Chronic gout, unspecified, without tophus (tophi): Secondary | ICD-10-CM | POA: Diagnosis not present

## 2019-09-12 DIAGNOSIS — E1142 Type 2 diabetes mellitus with diabetic polyneuropathy: Secondary | ICD-10-CM | POA: Diagnosis not present

## 2019-09-12 DIAGNOSIS — R6889 Other general symptoms and signs: Secondary | ICD-10-CM | POA: Diagnosis not present

## 2019-09-12 DIAGNOSIS — J449 Chronic obstructive pulmonary disease, unspecified: Secondary | ICD-10-CM | POA: Diagnosis not present

## 2019-09-12 DIAGNOSIS — Z79899 Other long term (current) drug therapy: Secondary | ICD-10-CM | POA: Diagnosis not present

## 2019-09-12 DIAGNOSIS — Z0000 Encounter for general adult medical examination without abnormal findings: Secondary | ICD-10-CM | POA: Diagnosis not present

## 2019-11-21 DIAGNOSIS — E1142 Type 2 diabetes mellitus with diabetic polyneuropathy: Secondary | ICD-10-CM | POA: Diagnosis not present

## 2019-11-21 DIAGNOSIS — J449 Chronic obstructive pulmonary disease, unspecified: Secondary | ICD-10-CM | POA: Diagnosis not present

## 2019-11-21 DIAGNOSIS — I10 Essential (primary) hypertension: Secondary | ICD-10-CM | POA: Diagnosis not present

## 2019-11-21 DIAGNOSIS — M5136 Other intervertebral disc degeneration, lumbar region: Secondary | ICD-10-CM | POA: Diagnosis not present

## 2019-11-21 DIAGNOSIS — M179 Osteoarthritis of knee, unspecified: Secondary | ICD-10-CM | POA: Diagnosis not present

## 2020-03-16 DIAGNOSIS — J449 Chronic obstructive pulmonary disease, unspecified: Secondary | ICD-10-CM | POA: Diagnosis not present

## 2020-03-16 DIAGNOSIS — M12812 Other specific arthropathies, not elsewhere classified, left shoulder: Secondary | ICD-10-CM | POA: Diagnosis not present

## 2020-03-16 DIAGNOSIS — E1142 Type 2 diabetes mellitus with diabetic polyneuropathy: Secondary | ICD-10-CM | POA: Diagnosis not present

## 2020-03-16 DIAGNOSIS — I10 Essential (primary) hypertension: Secondary | ICD-10-CM | POA: Diagnosis not present

## 2020-04-19 ENCOUNTER — Emergency Department (HOSPITAL_COMMUNITY): Payer: Medicare Other

## 2020-04-19 ENCOUNTER — Emergency Department (HOSPITAL_COMMUNITY)
Admission: EM | Admit: 2020-04-19 | Discharge: 2020-04-19 | Disposition: A | Discharge status: Home / Self Care | Payer: Medicare Other | Attending: Emergency Medicine | Admitting: Emergency Medicine

## 2020-04-19 ENCOUNTER — Encounter (HOSPITAL_COMMUNITY): Payer: Self-pay

## 2020-04-19 ENCOUNTER — Other Ambulatory Visit: Payer: Self-pay

## 2020-04-19 DIAGNOSIS — Z7951 Long term (current) use of inhaled steroids: Secondary | ICD-10-CM | POA: Insufficient documentation

## 2020-04-19 DIAGNOSIS — Z79899 Other long term (current) drug therapy: Secondary | ICD-10-CM | POA: Insufficient documentation

## 2020-04-19 DIAGNOSIS — R531 Weakness: Secondary | ICD-10-CM | POA: Insufficient documentation

## 2020-04-19 DIAGNOSIS — Z87891 Personal history of nicotine dependence: Secondary | ICD-10-CM | POA: Insufficient documentation

## 2020-04-19 DIAGNOSIS — Z20822 Contact with and (suspected) exposure to covid-19: Secondary | ICD-10-CM | POA: Insufficient documentation

## 2020-04-19 DIAGNOSIS — J45909 Unspecified asthma, uncomplicated: Secondary | ICD-10-CM | POA: Insufficient documentation

## 2020-04-19 DIAGNOSIS — Z951 Presence of aortocoronary bypass graft: Secondary | ICD-10-CM | POA: Diagnosis not present

## 2020-04-19 DIAGNOSIS — I2510 Atherosclerotic heart disease of native coronary artery without angina pectoris: Secondary | ICD-10-CM | POA: Insufficient documentation

## 2020-04-19 DIAGNOSIS — Z7984 Long term (current) use of oral hypoglycemic drugs: Secondary | ICD-10-CM | POA: Insufficient documentation

## 2020-04-19 DIAGNOSIS — I10 Essential (primary) hypertension: Secondary | ICD-10-CM | POA: Diagnosis not present

## 2020-04-19 DIAGNOSIS — N5089 Other specified disorders of the male genital organs: Secondary | ICD-10-CM

## 2020-04-19 DIAGNOSIS — Z7982 Long term (current) use of aspirin: Secondary | ICD-10-CM | POA: Diagnosis not present

## 2020-04-19 DIAGNOSIS — N3001 Acute cystitis with hematuria: Secondary | ICD-10-CM

## 2020-04-19 DIAGNOSIS — R0602 Shortness of breath: Secondary | ICD-10-CM | POA: Diagnosis not present

## 2020-04-19 DIAGNOSIS — E119 Type 2 diabetes mellitus without complications: Secondary | ICD-10-CM | POA: Diagnosis not present

## 2020-04-19 DIAGNOSIS — J441 Chronic obstructive pulmonary disease with (acute) exacerbation: Secondary | ICD-10-CM | POA: Diagnosis not present

## 2020-04-19 DIAGNOSIS — N453 Epididymo-orchitis: Secondary | ICD-10-CM | POA: Insufficient documentation

## 2020-04-19 DIAGNOSIS — N50811 Right testicular pain: Secondary | ICD-10-CM

## 2020-04-19 LAB — URINALYSIS, ROUTINE W REFLEX MICROSCOPIC
Bilirubin Urine: NEGATIVE
Glucose, UA: NEGATIVE mg/dL
Ketones, ur: NEGATIVE mg/dL
Nitrite: POSITIVE — AB
Protein, ur: NEGATIVE mg/dL
Specific Gravity, Urine: 1.013 (ref 1.005–1.030)
WBC, UA: >50 WBC/hpf — ABNORMAL HIGH (ref 0–5)
pH: 5 (ref 5.0–8.0)

## 2020-04-19 LAB — CBC WITH DIFFERENTIAL/PLATELET
Abs Immature Granulocytes: 0.07 10*3/uL (ref 0.00–0.07)
Basophils Absolute: 0 10*3/uL (ref 0.0–0.1)
Basophils Relative: 0 %
Eosinophils Absolute: 0.1 10*3/uL (ref 0.0–0.5)
Eosinophils Relative: 1 %
HCT: 40.4 % (ref 39.0–52.0)
Hemoglobin: 13.1 g/dL (ref 13.0–17.0)
Immature Granulocytes: 1 %
Lymphocytes Relative: 11 %
Lymphs Abs: 1.6 10*3/uL (ref 0.7–4.0)
MCH: 28.6 pg (ref 26.0–34.0)
MCHC: 32.4 g/dL (ref 30.0–36.0)
MCV: 88.2 fL (ref 80.0–100.0)
Monocytes Absolute: 1.1 10*3/uL — ABNORMAL HIGH (ref 0.1–1.0)
Monocytes Relative: 7 %
Neutro Abs: 11.9 10*3/uL — ABNORMAL HIGH (ref 1.7–7.7)
Neutrophils Relative %: 80 %
Platelets: 301 10*3/uL (ref 150–400)
RBC: 4.58 MIL/uL (ref 4.22–5.81)
RDW: 14.7 % (ref 11.5–15.5)
WBC: 14.8 10*3/uL — ABNORMAL HIGH (ref 4.0–10.5)
nRBC: 0 % (ref 0.0–0.2)

## 2020-04-19 LAB — BASIC METABOLIC PANEL WITH GFR
Anion gap: 12 (ref 5–15)
BUN: 31 mg/dL — ABNORMAL HIGH (ref 8–23)
CO2: 21 mmol/L — ABNORMAL LOW (ref 22–32)
Calcium: 9.1 mg/dL (ref 8.9–10.3)
Chloride: 104 mmol/L (ref 98–111)
Creatinine, Ser: 1.94 mg/dL — ABNORMAL HIGH (ref 0.61–1.24)
GFR calc Af Amer: 38 mL/min — ABNORMAL LOW
GFR calc non Af Amer: 33 mL/min — ABNORMAL LOW
Glucose, Bld: 104 mg/dL — ABNORMAL HIGH (ref 70–99)
Potassium: 4.3 mmol/L (ref 3.5–5.1)
Sodium: 137 mmol/L (ref 135–145)

## 2020-04-19 LAB — HEPATIC FUNCTION PANEL
ALT: 12 U/L (ref 0–44)
AST: 20 U/L (ref 15–41)
Albumin: 3.7 g/dL (ref 3.5–5.0)
Alkaline Phosphatase: 64 U/L (ref 38–126)
Bilirubin, Direct: 0.3 mg/dL — ABNORMAL HIGH (ref 0.0–0.2)
Indirect Bilirubin: 0.5 mg/dL (ref 0.3–0.9)
Total Bilirubin: 0.8 mg/dL (ref 0.3–1.2)
Total Protein: 7.3 g/dL (ref 6.5–8.1)

## 2020-04-19 LAB — SARS CORONAVIRUS 2 BY RT PCR (HOSPITAL ORDER, PERFORMED IN ~~LOC~~ HOSPITAL LAB): SARS Coronavirus 2: NEGATIVE

## 2020-04-19 LAB — CBG MONITORING, ED: Glucose-Capillary: 96 mg/dL (ref 70–99)

## 2020-04-19 MED ORDER — AEROCHAMBER PLUS FLO-VU MEDIUM MISC
1.0000 | Freq: Once | Status: DC
  Filled 2020-04-19: qty 1

## 2020-04-19 MED ORDER — METHYLPREDNISOLONE SODIUM SUCC 125 MG IJ SOLR
125.0000 mg | Freq: Once | INTRAMUSCULAR | Status: AC
  Administered 2020-04-19: 125 mg via INTRAVENOUS
  Filled 2020-04-19: qty 2

## 2020-04-19 MED ORDER — PREDNISONE 10 MG (21) PO TBPK: ORAL_TABLET | Freq: Every day | ORAL | 0 refills | Status: DC

## 2020-04-19 MED ORDER — ALBUTEROL SULFATE HFA 108 (90 BASE) MCG/ACT IN AERS
4.0000 | INHALATION_SPRAY | Freq: Once | RESPIRATORY_TRACT | Status: AC
  Administered 2020-04-19: 4 via RESPIRATORY_TRACT
  Filled 2020-04-19: qty 6.7

## 2020-04-19 MED ORDER — IPRATROPIUM BROMIDE HFA 17 MCG/ACT IN AERS
2.0000 | INHALATION_SPRAY | Freq: Once | RESPIRATORY_TRACT | Status: AC
  Administered 2020-04-19: 2 via RESPIRATORY_TRACT
  Filled 2020-04-19: qty 12.9

## 2020-04-19 MED ORDER — DOXYCYCLINE HYCLATE 100 MG PO CAPS: 100.0000 mg | ORAL_CAPSULE | Freq: Two times a day (BID) | ORAL | 0 refills | Status: AC

## 2020-04-19 NOTE — ED Notes (Signed)
Pt transported to Ultrasound.  

## 2020-04-19 NOTE — ED Provider Notes (Signed)
[facility] EMERGENCY DEPARTMENT Provider Note   CSN: [id number] Arrival date & time: 04/19/20  3664     History Chief Complaint  Patient presents with  . Shortness of Breath  . Weakness    Luis Carter is a 74 y.o. male.  HPI   Patient is a 74 year old male with a history of COPD, asthma, cardiomyopathy, diabetes mellitus, hyperlipidemia, MI, pancreatitis, seizure disorder.  Patient presents today via POV due to 3 days of shortness of breath and weakness.  Patient states his symptoms are typically worse at night and when lying flat.  He reports 2 pillow orthopnea.  He reports a mild intermittently productive cough for the past 2 days.  He has been using his albuterol inhaler with mild short-term relief.  He is not on oxygen at home.  He has been vaccinated for COVID-19.  No fevers, chills, chest pain, leg swelling, abdominal pain, vomiting, syncope.     Past Medical History:  Diagnosis Date  . Arthritis    "left arm/shoulder; both legs" (12/12/2015)  . ASTHMA    since childhood  . Cardiomyopathy (HCC) 01/25/2016   Echo 12/13/15 - Mild LVH, EF 45-50%, normal wall motion, grade 1 diastolic dysfunction, mild LAE, mild RVE, mild RAE  . COPD 04/14/2007  . Coronary artery disease   . DIABETES MELLITUS, TYPE II 04/14/2007  . GERD (gastroesophageal reflux disease)   . GI bleed   . Gout   . High cholesterol   . History of nuclear stress test    a.  Myoview 6/17: EF 54%, no ischemia  . HYPERTENSION 04/14/2007  . LEG PAIN, RIGHT 05/19/2008  . LOW BACK PAIN 04/14/2007  . Myocardial infarction (HCC)    ~ 2001/notes 09/13/2001 (03/22/2013)  . PANCREATITIS, HX OF 04/14/2007  . Pneumonia    "twice" (12/12/2015)  . SEIZURE DISORDER 04/14/2007   "used to have them when I was young" (03/22/2013)  . Shortness of breath    "related to asthma" (03/22/2013)    Patient Active Problem List   Diagnosis Date Noted  . Community acquired pneumonia of right upper lobe of lung   .  Respiratory failure with hypoxia and hypercapnia (HCC) 10/11/2018  . Acute pain of left shoulder 10/15/2016  . Pain in right leg 10/15/2016  . Weakness of right leg 10/15/2016  . Cardiomyopathy (HCC) 01/25/2016  . Chronic renal disease, stage III 12/13/2015  . Abnormal echocardiogram 12/13/2015  . Syncope 12/12/2015  . Tobacco use disorder 09/07/2015  . Gout 01/27/2012  . Cocaine use 12/22/2011  . 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 Essential hypertension 04/14/2007  . Asthma 04/14/2007  . COPD (chronic obstructive pulmonary disease) (HCC) 04/14/2007  . 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 Seizures (HCC) 04/14/2007  . PANCREATITIS, HX OF 04/14/2007    Past Surgical History:  Procedure Laterality Date  . CORONARY ANGIOPLASTY WITH STENT PLACEMENT    . ESOPHAGOGASTRODUODENOSCOPY  12/21/2011   Procedure: ESOPHAGOGASTRODUODENOSCOPY (EGD);  Surgeon: Louis Meckel, MD;  Location: [facility] ENDOSCOPY;  Service: Endoscopy;  Laterality: N/A;       Family History  Problem Relation Age of Onset  . Heart disease Sister   . Heart disease Brother   . Heart disease Brother   . Heart disease Brother   . Heart disease Brother     Social History   Tobacco Use  . Smoking status: Former Smoker    Packs/day: 0.25    Years: 57.00  Pack years: 14.25    Types: Cigarettes    Quit date: 11/24/2015    Years since quitting: 4.4  . Smokeless tobacco: Never Used  Substance Use Topics  . Alcohol use: Yes    Comment: 12/12/2015 "quit drinking years ago; never had problem w/it"  . Drug use: No    Home Medications Prior to Admission medications   Medication Sig Start Date End Date Taking? Authorizing Provider  albuterol (PROAIR HFA) 108 (90 BASE) MCG/ACT inhaler INHALE 2 PUFFS INTO THE LUNGS EVERY 6 (SIX) HOURS AS NEEDED FOR WHEEZING. Patient taking differently: Inhale 2 puffs into the lungs every 6 (six) hours as needed. AS NEEDED FOR WHEEZING. 03/05/15    Gordy Savers, MD  allopurinol (ZYLOPRIM) 100 MG tablet Take 2 tablets (200 mg total) by mouth daily. 03/05/15   Gordy Savers, MD  amLODipine (NORVASC) 5 MG tablet TAKE 1 TABLET BY MOUTH EVERY DAY 06/02/16   Gordy Savers, MD  aspirin ([facility] ASPIRIN LOW DOSE) 81 MG EC tablet Take 1 tablet (81 mg total) by mouth daily. Swallow whole. 12/17/16   Gordy Savers, MD  atorvastatin (LIPITOR) 20 MG tablet TAKE 1 TABLET BY MOUTH EVERY DAY Patient taking differently: Take 20 mg by mouth daily at 6 PM.  06/02/16   Gordy Savers, MD  azithromycin (ZITHROMAX) 250 MG tablet Take 1 tablet (250 mg total) by mouth daily. 10/14/18   Calvert Cantor, MD  cetirizine (ZYRTEC) 10 MG tablet Take 1 tablet (10 mg total) by mouth daily. 12/13/18   Myra Rude, MD  colchicine (COLCRYS) 0.6 MG tablet TAKE 1 TABLET BY MOUTH TWICE A DAY AS NEEDED FOR GOUT FLARE-UPS 03/05/15   Gordy Savers, MD  diclofenac sodium (VOLTAREN) 1 % GEL APPLY TO AFFECTED AREA 4 TIMES A DAY 09/04/14   Gordy Savers, MD  Fluticasone-Salmeterol (ADVAIR) 250-50 MCG/DOSE AEPB Inhale 1 puff into the lungs 2 (two) times daily.    [provider]  lisinopril (PRINIVIL,ZESTRIL) 20 MG tablet Take 1 tablet (20 mg total) by mouth daily. 03/05/15   Gordy Savers, MD  metFORMIN (GLUCOPHAGE) 500 MG tablet Take 1 tablet (500 mg total) by mouth 2 (two) times daily. 10/13/18   Calvert Cantor, MD  montelukast (SINGULAIR) 10 MG tablet Take 1 tablet (10 mg total) by mouth at bedtime. 03/05/15   Gordy Savers, MD  montelukast (SINGULAIR) 10 MG tablet TAKE 1 TABLET BY MOUTH AT BEDTIME Patient taking differently: Take 10 mg by mouth at bedtime.  06/02/16   Gordy Savers, MD  omeprazole (PRILOSEC) 20 MG capsule Take 1 capsule (20 mg total) by mouth daily. 03/05/15   Gordy Savers, MD  triamcinolone cream (KENALOG) 0.1 % Apply 1 application topically 2 (two) times daily. 03/05/15   Gordy Savers,  MD  Vitamin D, Ergocalciferol, (DRISDOL) 50000 units CAPS capsule Take 50,000 Units by mouth once a week. Monday 05/14/17   [provider]    Allergies    Patient has no known allergies.  Review of Systems   Review of Systems  All other systems reviewed and are negative. Ten systems reviewed and are negative for acute change, except as noted in the HPI.   Physical Exam Updated Vital Signs BP (!) 148/62 (BP Location: Right Arm)   Pulse (!) 47   Temp 98.4 F (36.9 C) (Oral)   Resp 16   Ht 5\' 8"  (1.727 m)   Wt 63.5 kg   BMI 21.29 kg/m  Physical Exam Vitals and nursing note reviewed. Exam conducted with a chaperone present.  Constitutional:      General: He is in acute distress.     Appearance: Normal appearance. He is well-developed and normal weight. He is not ill-appearing, toxic-appearing or diaphoretic.  HENT:     Head: Normocephalic and atraumatic.     Right Ear: External ear normal.     Left Ear: External ear normal.     Nose: Nose normal.     Mouth/Throat:     Mouth: Mucous membranes are moist.     Pharynx: Oropharynx is clear. No oropharyngeal exudate or posterior oropharyngeal erythema.  Eyes:     Extraocular Movements: Extraocular movements intact.  Cardiovascular:     Rate and Rhythm: Normal rate and regular rhythm.     Pulses: Normal pulses.     Heart sounds: Normal heart sounds. No murmur heard.  No friction rub. No gallop.   Pulmonary:     Effort: Pulmonary effort is normal. No tachypnea, bradypnea, accessory muscle usage or respiratory distress.     Breath sounds: No stridor. Examination of the right-middle field reveals wheezing. Examination of the left-middle field reveals wheezing. Examination of the right-lower field reveals wheezing. Examination of the left-lower field reveals wheezing. Wheezing present. No decreased breath sounds, rhonchi or rales.     Comments: Pt placed on 2 L via [place]. Saturations above 95%. Abdominal:     General: Abdomen  is flat. Bowel sounds are normal.     Palpations: Abdomen is soft.     Tenderness: There is no abdominal tenderness.     Comments: Abdomen is soft and non tender.  No flank pain.  Genitourinary:    Penis: Uncircumcised.      Testes: Cremasteric reflex is present.        Right: Tenderness and swelling present.     Comments: Normal-appearing uncircumcised penis.  Moderate edema noted along the right testicle when compared to the left.  Mild TTP noted diffusely along the right testicle. Musculoskeletal:        General: Normal range of motion.     Cervical back: Normal range of motion and neck supple. No tenderness.     Right lower leg: No tenderness. No edema.     Left lower leg: No tenderness. No edema.  Skin:    General: Skin is warm and dry.  Neurological:     General: No focal deficit present.     Mental Status: He is alert and oriented to person, place, and time.  Psychiatric:        Mood and Affect: Mood normal.        Behavior: Behavior normal.    ED Results / Procedures / Treatments   Labs (all labs ordered are listed, but only abnormal results are displayed) Labs Reviewed  BASIC METABOLIC PANEL - Abnormal; Notable for the following components:      Result Value   CO2 21 (*)    Glucose, Bld 104 (*)    BUN 31 (*)    Creatinine, Ser 1.94 (*)    GFR calc non Af Amer 33 (*)    GFR calc Af Amer 38 (*)    All other components within normal limits  URINALYSIS, ROUTINE W REFLEX MICROSCOPIC - Abnormal; Notable for the following components:   APPearance HAZY (*)    Hgb urine dipstick SMALL (*)    Nitrite POSITIVE (*)    Leukocytes,Ua LARGE (*)    WBC, UA >50 (*)  Bacteria, UA RARE (*)    All other components within normal limits  HEPATIC FUNCTION PANEL - Abnormal; Notable for the following components:   Bilirubin, Direct 0.3 (*)    All other components within normal limits  CBC WITH DIFFERENTIAL/PLATELET - Abnormal; Notable for the following components:   WBC 14.8 (*)      Neutro Abs 11.9 (*)    Monocytes Absolute 1.1 (*)    All other components within normal limits  SARS CORONAVIRUS 2 BY RT PCR (HOSPITAL ORDER, PERFORMED IN  HOSPITAL LAB)  URINE CULTURE  CBG MONITORING, ED   EKG None  Radiology DG Chest Portable 1 View  Result Date: 04/19/2020 CLINICAL DATA:  Shortness of breath EXAM: PORTABLE CHEST 1 VIEW COMPARISON:  October 11, 2018 FINDINGS: The cardiomediastinal silhouette is unchanged in contour.Apical pleural thickening, unchanged. No pleural effusion. No pneumothorax. Unchanged mild reticular prominence. Resolution of RIGHT upper lobe consolidation. No acute pleuroparenchymal abnormality. Visualized abdomen is unremarkable. Remote LEFT rib fractures. Shrapnel overlying the LEFT upper thorax. IMPRESSION: No acute cardiopulmonary abnormality. Resolution of RIGHT upper lobe consolidation. Electronically Signed   By: Meda KlinefelterStephanie  Peacock MD   On: 04/19/2020 10:30   US SCROTUM W/DOPPLER  Result Date: 04/19/2020 CLINICAL DATA:  Right testicle pain for 3 days EXAM: SCROTAL ULTRASOUND DOPPLER ULTRASOUND OF THE TESTICLES TECHNIQUE: Complete ultrasound examination of the testicles, epididymis, and other scrotal structures was performed. Color and spectral Doppler ultrasound were also utilized to evaluate blood flow to the testicles. COMPARISON:  None. FINDINGS: Right testicle Measurements: 3.7 x 1.6 x 3.1 cm. Cystic areas within the testicle measure up to 8 mm and 14 mm. Increased vascularity throughout the right testicle. Left testicle Measurements: 2.9 x 2.4 x 2.4 cm. No mass or microlithiasis visualized. Right epididymis:  Increased vascularity throughout the epididymis. Left epididymis:  2 mm cyst in the tail. Hydrocele:  None visualized. Varicocele:  Mild right varicocele. Pulsed Doppler interrogation of both testes demonstrates normal low resistance arterial and venous waveforms bilaterally. IMPRESSION: Increased vascularity noted within the right  testicle and epididymis compatible with epididymo-orchitis. Mild right varicocele. Electronically Signed   By: Charlett NoseKevin  Dover M.D.   On: 04/19/2020 15:27    Procedures Procedures (including critical care time)  Medications Ordered in ED Medications  AeroChamber Plus Flo-Vu Medium MISC 1 each (1 each Other Not Given 04/19/20 1146)  albuterol (VENTOLIN HFA) 108 (90 Base) MCG/ACT inhaler 4 puff (4 puffs Inhalation Given 04/19/20 1011)  ipratropium (ATROVENT HFA) inhaler 2 puff (2 puffs Inhalation Given 04/19/20 1011)  methylPREDNISolone sodium succinate (SOLU-MEDROL) 125 mg/2 mL injection 125 mg (125 mg Intravenous Given 04/19/20 1012)   ED Course  I have reviewed the triage vital signs and the nursing notes.  Pertinent labs & imaging results that were available during my care of the patient were reviewed by me and considered in my medical decision making (see chart for details).  Clinical Course as of Apr 19 1538  Thu Apr 19, 2020  1035 No acute cardiopulmonary abnormality. Resolution of RIGHT upper lobe consolidation.  DG Chest Portable 1 View [LJ]  1044 Patient reassessed.  He has had Ventolin, Atrovent, Solu-Medrol.  States he is starting to feel better.  Oxygen saturations around 97% on 2 L via nasal cannula.  HR in the 80s. Labs pending.  We will continue to monitor.   [LJ]  1146 SARS Coronavirus 2: NEGATIVE [LJ]  1218 Pt taken off of oxygen and is saturating around 98% on RA.    [  LJ]  1222 Inaccurate vitals; HR is not in the 30s  Pulse Rate(!): 30 [LJ]  1332 UA resulted showing positive nitrites, large leukocytes, greater than 50 white blood cells, rare bacteria.  I discussed this with the patient and he now endorses dysuria as well as right-sided testicular swelling.  He states that "he did not want to talk about it earlier in front of the ladies".  He did not notice his testicular pain or swelling until this morning.  I examined him and patient does have diffuse mild edema and tenderness  along the right testicle when compared to the left.  He states he is not sexually active.  Will obtain a testicular ultrasound.  Will reassess.   [LJ]    Clinical Course User Index [LJ] Placido Sou, PA-C   MDM Rules/Calculators/A&P                          Pt is a 74 y.o. male that presents with a history, physical exam, and ED Clinical Course as noted above.   Patient presents today with what appears to be multiple complaints secondary to a COPD exacerbation as well as right-sided orchitis.  Patient is 3 days of worsening shortness of breath as well as weakness.  Nursing staff was initially having difficulty with his pulse ox and he was started on 2 L of oxygen via nasal cannula.  This was ultimately removed and patient has been saturating above 98% on room air.  He was given Solu-Medrol as well as albuterol and ipratropium.  He notes relief of his shortness of breath and states that he is feeling much better.  Patient's UA showed large leukocytes as well as nitrites.  Discussed this with the patient and he is having no flank pain on exam.  On reassessment he now notes dysuria as well as right-sided testicular pain which he states that he did not want to discuss in front of the male nursing staff.  On exam patient does have right-sided testicular pain and swelling.  I would an ultrasound which is showing right-sided epididymoorchitis.  Discussed this finding with the patient.  I am going to discharge him on 2 weeks of doxycycline as well as a prednisone taper for his COPD exacerbation.  Doxycycline should also cover his COPD exacerbation as well.  This was discussed with my attending physician who also evaluated the patient and agrees with the above plan. Pt understands he needs to return to the emergency department with any new or worsening symptoms.  His questions were answered and he was amicable at the time of discharge.  His vital signs are stable.   An After Visit Summary was printed  and given to the patient.  Note: Portions of this report may have been transcribed using voice recognition software. Every effort was made to ensure accuracy; however, inadvertent computerized transcription errors may be present.   Final Clinical Impression(s) / ED Diagnoses Final diagnoses:  COPD exacerbation (HCC)  Acute cystitis with hematuria  Epididymo-orchitis   Rx / DC Orders ED Discharge Orders         Ordered    doxycycline (VIBRAMYCIN) 100 MG capsule  2 times daily        04/19/20 1534    predniSONE (STERAPRED UNI-PAK 21 TAB) 10 MG (21) TBPK tablet  Daily        04/19/20 1534           Placido Sou, PA-C 04/19/20 1542  Braxden Dibbles, MD 04/24/20 1515

## 2020-04-19 NOTE — Discharge Instructions (Addendum)
I am prescribing you an antibiotic called doxycycline.  You are going to take this twice a day for the next 14 days.  Please do not stop taking this medication early.  I am also prescribing you a prednisone taper.  This will help with your shortness of breath.  Please follow the instructions closely.  If you develop any new or worsening symptoms you need to return to the emergency department immediately for reevaluation.  Please follow-up with your primary care provider regarding your symptoms as well as this visit.  It was a pleasure to meet you.

## 2020-04-19 NOTE — ED Triage Notes (Signed)
Pt presents with SOB and weakness x3 days, states its worse at night. Increase coughing x2 days.

## 2020-04-21 LAB — URINE CULTURE: Culture: >=100000 — AB

## 2020-04-23 ENCOUNTER — Telehealth: Payer: Self-pay | Admitting: Emergency Medicine

## 2020-04-23 NOTE — Telephone Encounter (Signed)
Post ED Visit - Positive Culture Follow-up: Successful Patient Follow-Up  Culture assessed and recommendations reviewed by:  []  , Pharm.D. []  Enzo Bi, Pharm.D., BCPS AQ-ID []  , Pharm.D., BCPS []  Celedonio Miyamoto, .D., BCPS []  [place], .D., BCPS, AAHIVP []  Georgina Pillion, Pharm.D., BCPS, AAHIVP []  [address], PharmD, BCPS []  , PharmD, BCPS []  [place], PharmD, BCPS []  [address], PharmD  Positive urine culture  [x]  Patient discharged without antimicrobial prescription and treatment is now indicated []  Organism is resistant to prescribed ED discharge antimicrobial []  Patient with positive blood cultures  Changes discussed with ED provider: PA New antibiotic prescription continue doxycycline for pulmonary concerns, start Keflex 500mg  po tid x 7 days  Attempting to contact patient  Estella Husk 04/23/2020, 9:59 AM

## 2020-05-11 DIAGNOSIS — N183 Chronic kidney disease, stage 3 unspecified: Secondary | ICD-10-CM | POA: Diagnosis not present

## 2020-05-11 DIAGNOSIS — I10 Essential (primary) hypertension: Secondary | ICD-10-CM | POA: Diagnosis not present

## 2020-05-11 DIAGNOSIS — E1142 Type 2 diabetes mellitus with diabetic polyneuropathy: Secondary | ICD-10-CM | POA: Diagnosis not present

## 2020-05-11 DIAGNOSIS — N1832 Chronic kidney disease, stage 3b: Secondary | ICD-10-CM | POA: Diagnosis not present

## 2020-05-11 DIAGNOSIS — J449 Chronic obstructive pulmonary disease, unspecified: Secondary | ICD-10-CM | POA: Diagnosis not present

## 2020-05-11 DIAGNOSIS — Z23 Encounter for immunization: Secondary | ICD-10-CM | POA: Diagnosis not present

## 2020-06-11 DIAGNOSIS — I10 Essential (primary) hypertension: Secondary | ICD-10-CM | POA: Diagnosis not present

## 2020-06-11 DIAGNOSIS — M189 Osteoarthritis of first carpometacarpal joint, unspecified: Secondary | ICD-10-CM | POA: Diagnosis not present

## 2020-06-11 DIAGNOSIS — E1142 Type 2 diabetes mellitus with diabetic polyneuropathy: Secondary | ICD-10-CM | POA: Diagnosis not present

## 2020-06-11 DIAGNOSIS — J449 Chronic obstructive pulmonary disease, unspecified: Secondary | ICD-10-CM | POA: Diagnosis not present

## 2020-09-18 DIAGNOSIS — J449 Chronic obstructive pulmonary disease, unspecified: Secondary | ICD-10-CM | POA: Diagnosis not present

## 2020-09-18 DIAGNOSIS — E7849 Other hyperlipidemia: Secondary | ICD-10-CM | POA: Diagnosis not present

## 2020-09-18 DIAGNOSIS — M179 Osteoarthritis of knee, unspecified: Secondary | ICD-10-CM | POA: Diagnosis not present

## 2020-09-18 DIAGNOSIS — E1142 Type 2 diabetes mellitus with diabetic polyneuropathy: Secondary | ICD-10-CM | POA: Diagnosis not present

## 2020-09-18 DIAGNOSIS — I10 Essential (primary) hypertension: Secondary | ICD-10-CM | POA: Diagnosis not present

## 2020-09-18 DIAGNOSIS — M1A9XX0 Chronic gout, unspecified, without tophus (tophi): Secondary | ICD-10-CM | POA: Diagnosis not present

## 2020-09-18 DIAGNOSIS — Z0000 Encounter for general adult medical examination without abnormal findings: Secondary | ICD-10-CM | POA: Diagnosis not present

## 2020-10-30 DIAGNOSIS — J449 Chronic obstructive pulmonary disease, unspecified: Secondary | ICD-10-CM | POA: Diagnosis not present

## 2020-10-30 DIAGNOSIS — I10 Essential (primary) hypertension: Secondary | ICD-10-CM | POA: Diagnosis not present

## 2020-10-30 DIAGNOSIS — E1142 Type 2 diabetes mellitus with diabetic polyneuropathy: Secondary | ICD-10-CM | POA: Diagnosis not present

## 2020-11-19 ENCOUNTER — Other Ambulatory Visit: Payer: Self-pay | Admitting: Internal Medicine

## 2020-11-19 DIAGNOSIS — J449 Chronic obstructive pulmonary disease, unspecified: Secondary | ICD-10-CM | POA: Diagnosis not present

## 2020-11-19 DIAGNOSIS — Z0000 Encounter for general adult medical examination without abnormal findings: Secondary | ICD-10-CM | POA: Diagnosis not present

## 2020-11-19 DIAGNOSIS — E1142 Type 2 diabetes mellitus with diabetic polyneuropathy: Secondary | ICD-10-CM | POA: Diagnosis not present

## 2020-11-19 DIAGNOSIS — N390 Urinary tract infection, site not specified: Secondary | ICD-10-CM | POA: Diagnosis not present

## 2020-11-19 DIAGNOSIS — I10 Essential (primary) hypertension: Secondary | ICD-10-CM | POA: Diagnosis not present

## 2020-11-19 DIAGNOSIS — E0842 Diabetes mellitus due to underlying condition with diabetic polyneuropathy: Secondary | ICD-10-CM | POA: Diagnosis not present

## 2020-11-21 LAB — COMPLETE METABOLIC PANEL WITH GFR
AG Ratio: 1.2 (calc) (ref 1.0–2.5)
ALT: 16 U/L (ref 9–46)
AST: 23 U/L (ref 10–35)
Albumin: 4 g/dL (ref 3.6–5.1)
Alkaline phosphatase (APISO): 72 U/L (ref 35–144)
BUN/Creatinine Ratio: 20 (calc) (ref 6–22)
BUN: 32 mg/dL — ABNORMAL HIGH (ref 7–25)
CO2: 22 mmol/L (ref 20–32)
Calcium: 9.5 mg/dL (ref 8.6–10.3)
Chloride: 105 mmol/L (ref 98–110)
Creat: 1.64 mg/dL — ABNORMAL HIGH (ref 0.70–1.18)
GFR, Est African American: 46 mL/min/{1.73_m2} — ABNORMAL LOW (ref >=60)
GFR, Est Non African American: 40 mL/min/{1.73_m2} — ABNORMAL LOW (ref >=60)
Globulin: 3.3 g/dL (calc) (ref 1.9–3.7)
Glucose, Bld: 83 mg/dL (ref 65–99)
Potassium: 6.7 mmol/L (ref 3.5–5.3)
Sodium: 137 mmol/L (ref 135–146)
Total Bilirubin: 0.3 mg/dL (ref 0.2–1.2)
Total Protein: 7.3 g/dL (ref 6.1–8.1)

## 2020-11-21 LAB — TSH: TSH: 2.98 mIU/L (ref 0.40–4.50)

## 2020-11-21 LAB — LIPID PANEL
Cholesterol: 116 mg/dL (ref <200)
HDL: 45 mg/dL (ref >=40)
LDL Cholesterol (Calc): 55 mg/dL (calc)
Non-HDL Cholesterol (Calc): 71 mg/dL (calc) (ref <130)
Total CHOL/HDL Ratio: 2.6 (calc) (ref <5.0)
Triglycerides: 79 mg/dL (ref <150)

## 2020-11-21 LAB — URINE CULTURE
MICRO NUMBER:: 11701610
SPECIMEN QUALITY:: ADEQUATE

## 2020-11-21 LAB — CBC
HCT: 35.7 % — ABNORMAL LOW (ref 38.5–50.0)
Hemoglobin: 11.3 g/dL — ABNORMAL LOW (ref 13.2–17.1)
MCH: 27.6 pg (ref 27.0–33.0)
MCHC: 31.7 g/dL — ABNORMAL LOW (ref 32.0–36.0)
MCV: 87.3 fL (ref 80.0–100.0)
MPV: 10.4 fL (ref 7.5–12.5)
Platelets: 367 10*3/uL (ref 140–400)
RBC: 4.09 10*6/uL — ABNORMAL LOW (ref 4.20–5.80)
RDW: 13.9 % (ref 11.0–15.0)
WBC: 8.8 10*3/uL (ref 3.8–10.8)

## 2020-12-12 DIAGNOSIS — E119 Type 2 diabetes mellitus without complications: Secondary | ICD-10-CM | POA: Diagnosis not present

## 2020-12-12 DIAGNOSIS — K219 Gastro-esophageal reflux disease without esophagitis: Secondary | ICD-10-CM | POA: Diagnosis not present

## 2020-12-12 DIAGNOSIS — I10 Essential (primary) hypertension: Secondary | ICD-10-CM | POA: Diagnosis not present

## 2020-12-12 DIAGNOSIS — M179 Osteoarthritis of knee, unspecified: Secondary | ICD-10-CM | POA: Diagnosis not present

## 2020-12-12 DIAGNOSIS — J449 Chronic obstructive pulmonary disease, unspecified: Secondary | ICD-10-CM | POA: Diagnosis not present

## 2020-12-12 DIAGNOSIS — M5136 Other intervertebral disc degeneration, lumbar region: Secondary | ICD-10-CM | POA: Diagnosis not present

## 2021-01-23 DIAGNOSIS — M5136 Other intervertebral disc degeneration, lumbar region: Secondary | ICD-10-CM | POA: Diagnosis not present

## 2021-01-23 DIAGNOSIS — I10 Essential (primary) hypertension: Secondary | ICD-10-CM | POA: Diagnosis not present

## 2021-01-23 DIAGNOSIS — E1142 Type 2 diabetes mellitus with diabetic polyneuropathy: Secondary | ICD-10-CM | POA: Diagnosis not present

## 2021-01-23 DIAGNOSIS — J449 Chronic obstructive pulmonary disease, unspecified: Secondary | ICD-10-CM | POA: Diagnosis not present

## 2021-01-23 DIAGNOSIS — N3281 Overactive bladder: Secondary | ICD-10-CM | POA: Diagnosis not present

## 2021-02-08 ENCOUNTER — Encounter (HOSPITAL_COMMUNITY): Payer: Self-pay

## 2021-02-08 ENCOUNTER — Ambulatory Visit (HOSPITAL_COMMUNITY)
Admission: EM | Admit: 2021-02-08 | Discharge: 2021-02-08 | Disposition: A | Discharge status: Home / Self Care | Payer: Medicare Other

## 2021-02-08 ENCOUNTER — Other Ambulatory Visit: Payer: Self-pay

## 2021-02-08 ENCOUNTER — Emergency Department (HOSPITAL_COMMUNITY): Payer: Medicare Other

## 2021-02-08 ENCOUNTER — Inpatient Hospital Stay (HOSPITAL_COMMUNITY)
Admission: EM | Admit: 2021-02-08 | Discharge: 2021-02-13 | DRG: 191 | Disposition: A | Discharge status: Home Health | Payer: Medicare Other | Attending: Internal Medicine | Admitting: Internal Medicine

## 2021-02-08 DIAGNOSIS — E1140 Type 2 diabetes mellitus with diabetic neuropathy, unspecified: Secondary | ICD-10-CM | POA: Diagnosis not present

## 2021-02-08 DIAGNOSIS — J441 Chronic obstructive pulmonary disease with (acute) exacerbation: Secondary | ICD-10-CM | POA: Diagnosis not present

## 2021-02-08 DIAGNOSIS — J189 Pneumonia, unspecified organism: Secondary | ICD-10-CM

## 2021-02-08 DIAGNOSIS — I2510 Atherosclerotic heart disease of native coronary artery without angina pectoris: Secondary | ICD-10-CM | POA: Diagnosis present

## 2021-02-08 DIAGNOSIS — Z955 Presence of coronary angioplasty implant and graft: Secondary | ICD-10-CM | POA: Diagnosis not present

## 2021-02-08 DIAGNOSIS — Z7982 Long term (current) use of aspirin: Secondary | ICD-10-CM | POA: Diagnosis not present

## 2021-02-08 DIAGNOSIS — N390 Urinary tract infection, site not specified: Secondary | ICD-10-CM | POA: Diagnosis not present

## 2021-02-08 DIAGNOSIS — J9811 Atelectasis: Secondary | ICD-10-CM | POA: Diagnosis present

## 2021-02-08 DIAGNOSIS — Z7951 Long term (current) use of inhaled steroids: Secondary | ICD-10-CM | POA: Diagnosis not present

## 2021-02-08 DIAGNOSIS — J9611 Chronic respiratory failure with hypoxia: Secondary | ICD-10-CM | POA: Diagnosis present

## 2021-02-08 DIAGNOSIS — R079 Chest pain, unspecified: Secondary | ICD-10-CM | POA: Diagnosis not present

## 2021-02-08 DIAGNOSIS — R0600 Dyspnea, unspecified: Secondary | ICD-10-CM | POA: Insufficient documentation

## 2021-02-08 DIAGNOSIS — I129 Hypertensive chronic kidney disease with stage 1 through stage 4 chronic kidney disease, or unspecified chronic kidney disease: Secondary | ICD-10-CM | POA: Diagnosis present

## 2021-02-08 DIAGNOSIS — Z87891 Personal history of nicotine dependence: Secondary | ICD-10-CM | POA: Diagnosis not present

## 2021-02-08 DIAGNOSIS — E785 Hyperlipidemia, unspecified: Secondary | ICD-10-CM | POA: Diagnosis present

## 2021-02-08 DIAGNOSIS — E875 Hyperkalemia: Secondary | ICD-10-CM

## 2021-02-08 DIAGNOSIS — M1990 Unspecified osteoarthritis, unspecified site: Secondary | ICD-10-CM | POA: Diagnosis present

## 2021-02-08 DIAGNOSIS — Z8249 Family history of ischemic heart disease and other diseases of the circulatory system: Secondary | ICD-10-CM | POA: Diagnosis not present

## 2021-02-08 DIAGNOSIS — A419 Sepsis, unspecified organism: Secondary | ICD-10-CM | POA: Diagnosis not present

## 2021-02-08 DIAGNOSIS — F419 Anxiety disorder, unspecified: Secondary | ICD-10-CM | POA: Diagnosis present

## 2021-02-08 DIAGNOSIS — M109 Gout, unspecified: Secondary | ICD-10-CM | POA: Diagnosis not present

## 2021-02-08 DIAGNOSIS — E1122 Type 2 diabetes mellitus with diabetic chronic kidney disease: Secondary | ICD-10-CM | POA: Diagnosis not present

## 2021-02-08 DIAGNOSIS — J449 Chronic obstructive pulmonary disease, unspecified: Secondary | ICD-10-CM | POA: Diagnosis present

## 2021-02-08 DIAGNOSIS — K219 Gastro-esophageal reflux disease without esophagitis: Secondary | ICD-10-CM | POA: Diagnosis present

## 2021-02-08 DIAGNOSIS — R059 Cough, unspecified: Secondary | ICD-10-CM | POA: Diagnosis present

## 2021-02-08 DIAGNOSIS — I429 Cardiomyopathy, unspecified: Secondary | ICD-10-CM | POA: Diagnosis not present

## 2021-02-08 DIAGNOSIS — Z7984 Long term (current) use of oral hypoglycemic drugs: Secondary | ICD-10-CM

## 2021-02-08 DIAGNOSIS — Z20822 Contact with and (suspected) exposure to covid-19: Secondary | ICD-10-CM | POA: Diagnosis not present

## 2021-02-08 DIAGNOSIS — N183 Chronic kidney disease, stage 3 unspecified: Secondary | ICD-10-CM | POA: Diagnosis not present

## 2021-02-08 DIAGNOSIS — R0602 Shortness of breath: Secondary | ICD-10-CM | POA: Diagnosis not present

## 2021-02-08 DIAGNOSIS — R0603 Acute respiratory distress: Secondary | ICD-10-CM | POA: Diagnosis not present

## 2021-02-08 DIAGNOSIS — E1129 Type 2 diabetes mellitus with other diabetic kidney complication: Secondary | ICD-10-CM | POA: Diagnosis present

## 2021-02-08 DIAGNOSIS — R0689 Other abnormalities of breathing: Secondary | ICD-10-CM | POA: Diagnosis not present

## 2021-02-08 DIAGNOSIS — E1169 Type 2 diabetes mellitus with other specified complication: Secondary | ICD-10-CM | POA: Diagnosis present

## 2021-02-08 DIAGNOSIS — E440 Moderate protein-calorie malnutrition: Secondary | ICD-10-CM | POA: Insufficient documentation

## 2021-02-08 DIAGNOSIS — R6520 Severe sepsis without septic shock: Secondary | ICD-10-CM | POA: Diagnosis not present

## 2021-02-08 DIAGNOSIS — I10 Essential (primary) hypertension: Secondary | ICD-10-CM | POA: Diagnosis not present

## 2021-02-08 DIAGNOSIS — Z751 Person awaiting admission to adequate facility elsewhere: Secondary | ICD-10-CM

## 2021-02-08 DIAGNOSIS — Z743 Need for continuous supervision: Secondary | ICD-10-CM | POA: Diagnosis not present

## 2021-02-08 DIAGNOSIS — E0821 Diabetes mellitus due to underlying condition with diabetic nephropathy: Secondary | ICD-10-CM

## 2021-02-08 DIAGNOSIS — N1832 Chronic kidney disease, stage 3b: Secondary | ICD-10-CM | POA: Diagnosis present

## 2021-02-08 DIAGNOSIS — Z79899 Other long term (current) drug therapy: Secondary | ICD-10-CM

## 2021-02-08 DIAGNOSIS — R509 Fever, unspecified: Secondary | ICD-10-CM | POA: Diagnosis present

## 2021-02-08 DIAGNOSIS — N179 Acute kidney failure, unspecified: Secondary | ICD-10-CM | POA: Diagnosis present

## 2021-02-08 DIAGNOSIS — R531 Weakness: Secondary | ICD-10-CM | POA: Diagnosis not present

## 2021-02-08 DIAGNOSIS — R6889 Other general symptoms and signs: Secondary | ICD-10-CM | POA: Diagnosis not present

## 2021-02-08 LAB — URINALYSIS, ROUTINE W REFLEX MICROSCOPIC
Bilirubin Urine: NEGATIVE
Glucose, UA: NEGATIVE mg/dL
Ketones, ur: NEGATIVE mg/dL
Nitrite: NEGATIVE
Protein, ur: 30 mg/dL — AB
Specific Gravity, Urine: 1.013 (ref 1.005–1.030)
pH: 8 (ref 5.0–8.0)

## 2021-02-08 LAB — CBC WITH DIFFERENTIAL/PLATELET
Abs Immature Granulocytes: 0.03 10*3/uL (ref 0.00–0.07)
Basophils Absolute: 0 10*3/uL (ref 0.0–0.1)
Basophils Relative: 0 %
Eosinophils Absolute: 0.2 10*3/uL (ref 0.0–0.5)
Eosinophils Relative: 1 %
HCT: 38 % — ABNORMAL LOW (ref 39.0–52.0)
Hemoglobin: 11.9 g/dL — ABNORMAL LOW (ref 13.0–17.0)
Immature Granulocytes: 0 %
Lymphocytes Relative: 20 %
Lymphs Abs: 2.3 10*3/uL (ref 0.7–4.0)
MCH: 27 pg (ref 26.0–34.0)
MCHC: 31.3 g/dL (ref 30.0–36.0)
MCV: 86.4 fL (ref 80.0–100.0)
Monocytes Absolute: 0.9 10*3/uL (ref 0.1–1.0)
Monocytes Relative: 8 %
Neutro Abs: 8.3 10*3/uL — ABNORMAL HIGH (ref 1.7–7.7)
Neutrophils Relative %: 71 %
Platelets: 223 10*3/uL (ref 150–400)
RBC: 4.4 MIL/uL (ref 4.22–5.81)
RDW: 14.7 % (ref 11.5–15.5)
WBC: 11.8 10*3/uL — ABNORMAL HIGH (ref 4.0–10.5)
nRBC: 0 % (ref 0.0–0.2)

## 2021-02-08 LAB — PROTIME-INR
INR: 1 (ref 0.8–1.2)
Prothrombin Time: 13.5 seconds (ref 11.4–15.2)

## 2021-02-08 LAB — COMPREHENSIVE METABOLIC PANEL
ALT: 18 U/L (ref 0–44)
AST: 33 U/L (ref 15–41)
Albumin: 3.8 g/dL (ref 3.5–5.0)
Alkaline Phosphatase: 81 U/L (ref 38–126)
Anion gap: 10 (ref 5–15)
BUN: 28 mg/dL — ABNORMAL HIGH (ref 8–23)
CO2: 23 mmol/L (ref 22–32)
Calcium: 9 mg/dL (ref 8.9–10.3)
Chloride: 103 mmol/L (ref 98–111)
Creatinine, Ser: 1.82 mg/dL — ABNORMAL HIGH (ref 0.61–1.24)
GFR, Estimated: 38 mL/min — ABNORMAL LOW (ref >60)
Glucose, Bld: 110 mg/dL — ABNORMAL HIGH (ref 70–99)
Potassium: 5.3 mmol/L — ABNORMAL HIGH (ref 3.5–5.1)
Sodium: 136 mmol/L (ref 135–145)
Total Bilirubin: 0.6 mg/dL (ref 0.3–1.2)
Total Protein: 7.7 g/dL (ref 6.5–8.1)

## 2021-02-08 LAB — APTT: aPTT: 31 seconds (ref 24–36)

## 2021-02-08 LAB — RESP PANEL BY RT-PCR (FLU A&B, COVID) ARPGX2
Influenza A by PCR: NEGATIVE
Influenza B by PCR: NEGATIVE
SARS Coronavirus 2 by RT PCR: NEGATIVE

## 2021-02-08 LAB — MAGNESIUM: Magnesium: 1.5 mg/dL — ABNORMAL LOW (ref 1.7–2.4)

## 2021-02-08 LAB — LACTIC ACID, PLASMA: Lactic Acid, Venous: 2.4 mmol/L (ref 0.5–1.9)

## 2021-02-08 MED ORDER — SODIUM CHLORIDE 0.9 % IV SOLN
2.0000 g | INTRAVENOUS | Status: DC
  Administered 2021-02-08 – 2021-02-09 (×2): 2 g via INTRAVENOUS
  Filled 2021-02-08: qty 20
  Filled 2021-02-08: qty 2
  Filled 2021-02-08: qty 20

## 2021-02-08 MED ORDER — SODIUM CHLORIDE 0.9 % IV BOLUS
1000.0000 mL | Freq: Once | INTRAVENOUS | Status: AC
  Administered 2021-02-08: 1000 mL via INTRAVENOUS

## 2021-02-08 MED ORDER — SODIUM CHLORIDE 0.9 % IV SOLN
500.0000 mg | INTRAVENOUS | Status: DC
  Administered 2021-02-08 – 2021-02-09 (×2): 500 mg via INTRAVENOUS
  Filled 2021-02-08 (×3): qty 500

## 2021-02-08 MED ORDER — ALBUTEROL SULFATE HFA 108 (90 BASE) MCG/ACT IN AERS
4.0000 | INHALATION_SPRAY | Freq: Once | RESPIRATORY_TRACT | Status: AC
  Administered 2021-02-08: 4 via RESPIRATORY_TRACT
  Filled 2021-02-08: qty 6.7

## 2021-02-08 MED ORDER — ALBUTEROL SULFATE HFA 108 (90 BASE) MCG/ACT IN AERS: 4.0000 | INHALATION_SPRAY | Freq: Once | RESPIRATORY_TRACT | Status: DC

## 2021-02-08 MED ORDER — ACETAMINOPHEN 500 MG PO TABS
1000.0000 mg | ORAL_TABLET | Freq: Once | ORAL | Status: AC
  Administered 2021-02-08: 1000 mg via ORAL
  Filled 2021-02-08: qty 2

## 2021-02-08 MED ORDER — IPRATROPIUM BROMIDE 0.02 % IN SOLN
0.5000 mg | Freq: Once | RESPIRATORY_TRACT | Status: AC
  Administered 2021-02-08: 0.5 mg via RESPIRATORY_TRACT
  Filled 2021-02-08: qty 2.5

## 2021-02-08 MED ORDER — LACTATED RINGERS IV BOLUS
1000.0000 mL | Freq: Once | INTRAVENOUS | Status: AC
  Administered 2021-02-08: 1000 mL via INTRAVENOUS

## 2021-02-08 MED ORDER — ALBUTEROL SULFATE (2.5 MG/3ML) 0.083% IN NEBU
5.0000 mg | INHALATION_SOLUTION | Freq: Once | RESPIRATORY_TRACT | Status: AC
  Administered 2021-02-08: 5 mg via RESPIRATORY_TRACT
  Filled 2021-02-08: qty 6
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
## 2021-02-08 NOTE — ED Notes (Signed)
Patient is being discharged from the Urgent Care and sent to the Emergency Department via EMS. Per Clent Jacks PA, patient is in need of higher level of care due to shortness of breath, concern for pulmonary elbolism. Patient is aware and verbalizes understanding of plan of care. Family updated. Vitals:   02/08/21 2000  BP: (!) 146/78  Pulse: 98  Resp: (!) 24  Temp: 99.6 F (37.6 C)  SpO2: (S) 96%

## 2021-02-08 NOTE — ED Notes (Signed)
EMS en route.  Called report to ED charge.

## 2021-02-08 NOTE — ED Triage Notes (Addendum)
Pt c/o shob since last night. Also c/o cramping in both legs and back of neck on and off, runny nose, chills last couple days. Denies chest pain.

## 2021-02-08 NOTE — Sepsis Progress Note (Signed)
Elink following for Code Sepsis  
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
## 2021-02-08 NOTE — ED Provider Notes (Addendum)
I examined patient in triage. Lung sounds without wheeze or rhonchi. NO peripheral edema. He stated 8/10 SOB that started last night. Worsening. No chest pains but is a slightly poor historian and keeps saying that he "hurts". He keep shivering and stating "help me jesus". He does not appear cyanotic but his pulse ox wave is inconstant and the highest we have been able to record here is 96. He states that he feels "bad, real bad". He reports that he though this was the hospital. He will be transported via EMS given symptoms, vitals and history as I am concerned about a pulmonary embolism.    Rushie Chestnut, Cordelia Poche 02/08/21 2016    Rushie Chestnut, PA-C 02/08/21 2047

## 2021-02-08 NOTE — ED Triage Notes (Signed)
Brought [facility] from [facility] for SOB that started this am progressively got worse through out the day. Several albuterol tx with no relief. PIV obtained, solu medrol 125mg , duoneb given

## 2021-02-09 ENCOUNTER — Encounter (HOSPITAL_COMMUNITY): Payer: Self-pay | Admitting: Internal Medicine

## 2021-02-09 ENCOUNTER — Inpatient Hospital Stay (HOSPITAL_COMMUNITY): Payer: Medicare Other

## 2021-02-09 DIAGNOSIS — N1832 Chronic kidney disease, stage 3b: Secondary | ICD-10-CM | POA: Diagnosis not present

## 2021-02-09 DIAGNOSIS — I10 Essential (primary) hypertension: Secondary | ICD-10-CM | POA: Diagnosis not present

## 2021-02-09 DIAGNOSIS — J441 Chronic obstructive pulmonary disease with (acute) exacerbation: Secondary | ICD-10-CM | POA: Diagnosis not present

## 2021-02-09 LAB — CBC
HCT: 35.3 % — ABNORMAL LOW (ref 39.0–52.0)
Hemoglobin: 10.8 g/dL — ABNORMAL LOW (ref 13.0–17.0)
MCH: 26.7 pg (ref 26.0–34.0)
MCHC: 30.6 g/dL (ref 30.0–36.0)
MCV: 87.4 fL (ref 80.0–100.0)
Platelets: 223 10*3/uL (ref 150–400)
RBC: 4.04 MIL/uL — ABNORMAL LOW (ref 4.22–5.81)
RDW: 14.8 % (ref 11.5–15.5)
WBC: 13.8 10*3/uL — ABNORMAL HIGH (ref 4.0–10.5)
nRBC: 0 % (ref 0.0–0.2)

## 2021-02-09 LAB — BASIC METABOLIC PANEL
Anion gap: 11 (ref 5–15)
BUN: 27 mg/dL — ABNORMAL HIGH (ref 8–23)
CO2: 21 mmol/L — ABNORMAL LOW (ref 22–32)
Calcium: 8.8 mg/dL — ABNORMAL LOW (ref 8.9–10.3)
Chloride: 102 mmol/L (ref 98–111)
Creatinine, Ser: 1.74 mg/dL — ABNORMAL HIGH (ref 0.61–1.24)
GFR, Estimated: 40 mL/min — ABNORMAL LOW (ref >60)
Glucose, Bld: 232 mg/dL — ABNORMAL HIGH (ref 70–99)
Potassium: 5.8 mmol/L — ABNORMAL HIGH (ref 3.5–5.1)
Sodium: 134 mmol/L — ABNORMAL LOW (ref 135–145)

## 2021-02-09 LAB — PROCALCITONIN: Procalcitonin: 3.11 ng/mL

## 2021-02-09 LAB — GLUCOSE, CAPILLARY
Glucose-Capillary: 197 mg/dL — ABNORMAL HIGH (ref 70–99)
Glucose-Capillary: 152 mg/dL — ABNORMAL HIGH (ref 70–99)

## 2021-02-09 LAB — CBG MONITORING, ED
Glucose-Capillary: 234 mg/dL — ABNORMAL HIGH (ref 70–99)
Glucose-Capillary: 246 mg/dL — ABNORMAL HIGH (ref 70–99)

## 2021-02-09 LAB — LACTIC ACID, PLASMA: Lactic Acid, Venous: 2.2 mmol/L (ref 0.5–1.9)

## 2021-02-09 MED ORDER — IPRATROPIUM-ALBUTEROL 0.5-2.5 (3) MG/3ML IN SOLN
3.0000 mL | Freq: Four times a day (QID) | RESPIRATORY_TRACT | Status: DC
  Administered 2021-02-09 (×3): 3 mL via RESPIRATORY_TRACT
  Filled 2021-02-09 (×3): qty 3

## 2021-02-09 MED ORDER — IPRATROPIUM-ALBUTEROL 0.5-2.5 (3) MG/3ML IN SOLN
3.0000 mL | Freq: Three times a day (TID) | RESPIRATORY_TRACT | Status: DC
  Administered 2021-02-10: 3 mL via RESPIRATORY_TRACT
  Filled 2021-02-09: qty 3

## 2021-02-09 MED ORDER — ENOXAPARIN SODIUM 40 MG/0.4ML IJ SOSY
40.0000 mg | PREFILLED_SYRINGE | INTRAMUSCULAR | Status: DC
  Administered 2021-02-09 – 2021-02-12 (×4): 40 mg via SUBCUTANEOUS
  Filled 2021-02-09 (×4): qty 0.4

## 2021-02-09 MED ORDER — ACETAMINOPHEN 650 MG RE SUPP: 650.0000 mg | Freq: Four times a day (QID) | RECTAL | Status: DC | PRN

## 2021-02-09 MED ORDER — SODIUM ZIRCONIUM CYCLOSILICATE 10 G PO PACK
10.0000 g | PACK | Freq: Two times a day (BID) | ORAL | Status: AC
  Administered 2021-02-09 (×2): 10 g via ORAL
  Filled 2021-02-09 (×2): qty 1

## 2021-02-09 MED ORDER — PREDNISONE 20 MG PO TABS
40.0000 mg | ORAL_TABLET | Freq: Every day | ORAL | Status: AC
  Administered 2021-02-10 – 2021-02-13 (×4): 40 mg via ORAL
  Filled 2021-02-09 (×4): qty 2

## 2021-02-09 MED ORDER — AMLODIPINE BESYLATE 5 MG PO TABS
5.0000 mg | ORAL_TABLET | Freq: Every day | ORAL | Status: DC
  Administered 2021-02-09 – 2021-02-13 (×5): 5 mg via ORAL
  Filled 2021-02-09 (×5): qty 1

## 2021-02-09 MED ORDER — PREDNISONE 20 MG PO TABS
50.0000 mg | ORAL_TABLET | Freq: Every day | ORAL | Status: DC
  Administered 2021-02-09: 50 mg via ORAL
  Filled 2021-02-09: qty 3

## 2021-02-09 MED ORDER — ATORVASTATIN CALCIUM 10 MG PO TABS
20.0000 mg | ORAL_TABLET | Freq: Every evening | ORAL | Status: DC
  Administered 2021-02-09 – 2021-02-12 (×4): 20 mg via ORAL
  Filled 2021-02-09 (×4): qty 2

## 2021-02-09 MED ORDER — ALBUTEROL SULFATE (2.5 MG/3ML) 0.083% IN NEBU: 2.5000 mg | INHALATION_SOLUTION | RESPIRATORY_TRACT | Status: DC | PRN

## 2021-02-09 MED ORDER — GABAPENTIN 300 MG PO CAPS
300.0000 mg | ORAL_CAPSULE | Freq: Three times a day (TID) | ORAL | Status: DC
  Administered 2021-02-09 – 2021-02-13 (×13): 300 mg via ORAL
  Filled 2021-02-09 (×13): qty 1

## 2021-02-09 MED ORDER — MONTELUKAST SODIUM 10 MG PO TABS
10.0000 mg | ORAL_TABLET | Freq: Every day | ORAL | Status: DC
  Administered 2021-02-09 – 2021-02-12 (×4): 10 mg via ORAL
  Filled 2021-02-09 (×5): qty 1

## 2021-02-09 MED ORDER — ACETAMINOPHEN 325 MG PO TABS: 650.0000 mg | ORAL_TABLET | Freq: Four times a day (QID) | ORAL | Status: DC | PRN

## 2021-02-09 MED ORDER — LISINOPRIL 20 MG PO TABS: 20.0000 mg | ORAL_TABLET | Freq: Every day | ORAL | Status: DC

## 2021-02-09 MED ORDER — MOMETASONE FURO-FORMOTEROL FUM 200-5 MCG/ACT IN AERO
2.0000 | INHALATION_SPRAY | Freq: Two times a day (BID) | RESPIRATORY_TRACT | Status: DC
  Administered 2021-02-10 – 2021-02-13 (×7): 2 via RESPIRATORY_TRACT
  Filled 2021-02-09 (×2): qty 8.8

## 2021-02-09 MED ORDER — INSULIN ASPART 100 UNIT/ML IJ SOLN
0.0000 [IU] | Freq: Three times a day (TID) | INTRAMUSCULAR | Status: DC
  Administered 2021-02-09 – 2021-02-10 (×2): 3 [IU] via SUBCUTANEOUS
  Administered 2021-02-10: 2 [IU] via SUBCUTANEOUS
  Administered 2021-02-10 – 2021-02-11 (×2): 3 [IU] via SUBCUTANEOUS
  Administered 2021-02-11: 2 [IU] via SUBCUTANEOUS
  Administered 2021-02-12: 5 [IU] via SUBCUTANEOUS
  Administered 2021-02-12: 3 [IU] via SUBCUTANEOUS
  Administered 2021-02-13: 5 [IU] via SUBCUTANEOUS

## 2021-02-09 NOTE — ED Notes (Signed)
Checked patient cbg it was 3 notified RN of blood sugar patient is resting with call bell in reach

## 2021-02-09 NOTE — ED Notes (Signed)
Attempted to call nursing report to 5C 

## 2021-02-09 NOTE — Progress Notes (Signed)
Patient received to the unit. Patient is alert and oriented x4. Iv in place. Skin assessment done with another nurse. Given instructions about call bell and phone. Bed in low position and call bell in reach. 
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
## 2021-02-09 NOTE — ED Notes (Signed)
hospitalist at bedside

## 2021-02-09 NOTE — ED Notes (Addendum)
Brother in law would like visitor in room with pt to call for an update he is worried because she is not answering his phone calls.

## 2021-02-10 DIAGNOSIS — N1832 Chronic kidney disease, stage 3b: Secondary | ICD-10-CM | POA: Diagnosis not present

## 2021-02-10 DIAGNOSIS — J441 Chronic obstructive pulmonary disease with (acute) exacerbation: Secondary | ICD-10-CM | POA: Diagnosis not present

## 2021-02-10 DIAGNOSIS — E875 Hyperkalemia: Secondary | ICD-10-CM

## 2021-02-10 DIAGNOSIS — I10 Essential (primary) hypertension: Secondary | ICD-10-CM | POA: Diagnosis not present

## 2021-02-10 DIAGNOSIS — E0821 Diabetes mellitus due to underlying condition with diabetic nephropathy: Secondary | ICD-10-CM | POA: Diagnosis not present

## 2021-02-10 LAB — BASIC METABOLIC PANEL
Anion gap: 9 (ref 5–15)
BUN: 39 mg/dL — ABNORMAL HIGH (ref 8–23)
CO2: 22 mmol/L (ref 22–32)
Calcium: 8.8 mg/dL — ABNORMAL LOW (ref 8.9–10.3)
Chloride: 104 mmol/L (ref 98–111)
Creatinine, Ser: 1.63 mg/dL — ABNORMAL HIGH (ref 0.61–1.24)
GFR, Estimated: 43 mL/min — ABNORMAL LOW (ref >60)
Glucose, Bld: 220 mg/dL — ABNORMAL HIGH (ref 70–99)
Potassium: 5.6 mmol/L — ABNORMAL HIGH (ref 3.5–5.1)
Sodium: 135 mmol/L (ref 135–145)

## 2021-02-10 LAB — CBC WITH DIFFERENTIAL/PLATELET
Abs Immature Granulocytes: 0.07 10*3/uL (ref 0.00–0.07)
Basophils Absolute: 0 10*3/uL (ref 0.0–0.1)
Basophils Relative: 0 %
Eosinophils Absolute: 0 10*3/uL (ref 0.0–0.5)
Eosinophils Relative: 0 %
HCT: 33.2 % — ABNORMAL LOW (ref 39.0–52.0)
Hemoglobin: 10.3 g/dL — ABNORMAL LOW (ref 13.0–17.0)
Immature Granulocytes: 1 %
Lymphocytes Relative: 9 %
Lymphs Abs: 1.4 10*3/uL (ref 0.7–4.0)
MCH: 27 pg (ref 26.0–34.0)
MCHC: 31 g/dL (ref 30.0–36.0)
MCV: 86.9 fL (ref 80.0–100.0)
Monocytes Absolute: 1 10*3/uL (ref 0.1–1.0)
Monocytes Relative: 7 %
Neutro Abs: 12.9 10*3/uL — ABNORMAL HIGH (ref 1.7–7.7)
Neutrophils Relative %: 83 %
Platelets: 220 10*3/uL (ref 150–400)
RBC: 3.82 MIL/uL — ABNORMAL LOW (ref 4.22–5.81)
RDW: 14.8 % (ref 11.5–15.5)
WBC: 15.4 10*3/uL — ABNORMAL HIGH (ref 4.0–10.5)
nRBC: 0 % (ref 0.0–0.2)

## 2021-02-10 LAB — GLUCOSE, CAPILLARY
Glucose-Capillary: 156 mg/dL — ABNORMAL HIGH (ref 70–99)
Glucose-Capillary: 157 mg/dL — ABNORMAL HIGH (ref 70–99)
Glucose-Capillary: 146 mg/dL — ABNORMAL HIGH (ref 70–99)
Glucose-Capillary: 184 mg/dL — ABNORMAL HIGH (ref 70–99)

## 2021-02-10 LAB — PROCALCITONIN: Procalcitonin: 3.92 ng/mL

## 2021-02-10 MED ORDER — AZITHROMYCIN 250 MG PO TABS
500.0000 mg | ORAL_TABLET | Freq: Every day | ORAL | Status: AC
  Administered 2021-02-10 – 2021-02-13 (×4): 500 mg via ORAL
  Filled 2021-02-10 (×4): qty 2

## 2021-02-10 MED ORDER — IPRATROPIUM-ALBUTEROL 0.5-2.5 (3) MG/3ML IN SOLN: 3.0000 mL | RESPIRATORY_TRACT | Status: DC | PRN

## 2021-02-10 MED ORDER — IPRATROPIUM-ALBUTEROL 0.5-2.5 (3) MG/3ML IN SOLN: 3.0000 mL | Freq: Four times a day (QID) | RESPIRATORY_TRACT | Status: DC

## 2021-02-10 MED ORDER — MAGNESIUM SULFATE 2 GM/50ML IV SOLN
2.0000 g | Freq: Once | INTRAVENOUS | Status: AC
  Administered 2021-02-10: 2 g via INTRAVENOUS
  Filled 2021-02-10: qty 50

## 2021-02-10 MED ORDER — SODIUM ZIRCONIUM CYCLOSILICATE 10 G PO PACK
10.0000 g | PACK | Freq: Two times a day (BID) | ORAL | Status: AC
  Administered 2021-02-10 – 2021-02-11 (×3): 10 g via ORAL
  Filled 2021-02-10 (×3): qty 1

## 2021-02-10 NOTE — Evaluation (Addendum)
Physical Therapy Evaluation Patient Details Name: Luis Carter MRN: [id number] DOB: 08-09-1946 Today's Date: 02/10/2021   History of Present Illness  Pt is a 75 y.o. M who presents with acute COPD exacerbation. Significant PMH: DM2, COPD, HTN.  Clinical Impression  PTA, pt lives with his sister, is independent with ADL's and uses a cane for mobility. Pt presents likely close to his functional baseline. Ambulating x 300 feet with a cane at a min guard assist level. SpO2 97-99% on RA, HR stable in 80's. Reports baseline R knee pain/weakness and resulting antalgic gait pattern. Overall, demonstrates gait abnormalities, decreased dynamic balance, weakness, and decreased endurance. Recommend HHPT follow up to address.     Follow Up Recommendations HHPT; Supervision for mobility/OOB    Equipment Recommendations  None recommended by PT    Recommendations for Other Services       Precautions / Restrictions Precautions Precautions: Fall Restrictions Weight Bearing Restrictions: No      Mobility  Bed Mobility Overal bed mobility: Modified Independent                  Transfers Overall transfer level: Needs assistance Equipment used: None Transfers: Sit to/from Stand Sit to Stand: Supervision            Ambulation/Gait Ambulation/Gait assistance: Min guard Gait Distance (Feet): 300 Feet Assistive device: Straight cane Gait Pattern/deviations: Step-through pattern;Antalgic;Decreased stance time - right Gait velocity: decreased   General Gait Details: Pt reports baseline difficulty with R knee pain/weakness, antalgic gait pattern, requiring min guard assist. Slow speed  Stairs            Wheelchair Mobility    Modified Rankin (Stroke Patients Only)       Balance Overall balance assessment: Needs assistance Sitting-balance support: Feet supported Sitting balance-Leahy Scale: Good     Standing balance support: No upper extremity supported;During  functional activity Standing balance-Leahy Scale: Fair                               Pertinent Vitals/Pain Pain Assessment: No/denies pain    Home Living Family/patient expects to be discharged to:: Private residence Living Arrangements: Other relatives (sister) Available Help at Discharge: Family Type of Home: House Home Access: Level entry     Home Layout: Able to live on main level with bedroom/bathroom Home Equipment: Cane - single point      Prior Function Level of Independence: Independent with assistive device(s)         Comments: uses cane, sister/BIL assist with IADL's, does not drive     Hand Dominance        Extremity/Trunk Assessment   Upper Extremity Assessment Upper Extremity Assessment: Overall WFL for tasks assessed    Lower Extremity Assessment Lower Extremity Assessment: Generalized weakness       Communication   Communication: No difficulties  Cognition Arousal/Alertness: Awake/alert Behavior During Therapy: WFL for tasks assessed/performed Overall Cognitive Status: Within Functional Limits for tasks assessed                                        General Comments      Exercises     Assessment/Plan    PT Assessment Patient needs continued PT services  PT Problem List Decreased strength;Decreased activity tolerance;Decreased balance;Decreased mobility       PT Treatment Interventions DME  instruction;Gait training;Functional mobility training;Therapeutic activities;Therapeutic exercise;Balance training;Patient/family education    PT Goals (Current goals can be found in the Care Plan section)  Acute Rehab PT Goals Patient Stated Goal: improve walking and right knee PT Goal Formulation: With patient Time For Goal Achievement: 02/24/21 Potential to Achieve Goals: Good    Frequency Min 3X/week   Barriers to discharge        Co-evaluation               AM-PAC PT "6 Clicks" Mobility   Outcome Measure Help needed turning from your back to your side while in a flat bed without using bedrails?: None Help needed moving from lying on your back to sitting on the side of a flat bed without using bedrails?: None Help needed moving to and from a bed to a chair (including a wheelchair)?: A Little Help needed standing up from a chair using your arms (e.g., wheelchair or bedside chair)?: A Little Help needed to walk in hospital room?: A Little Help needed climbing 3-5 steps with a railing? : A Little 6 Click Score: 20    End of Session   Activity Tolerance: Patient tolerated treatment well Patient left: in chair;with call bell/phone within reach;with chair alarm set Nurse Communication: Mobility status PT Visit Diagnosis: Unsteadiness on feet (R26.81);Other abnormalities of gait and mobility (R26.89)    Time: 3419-3790 PT Time Calculation (min) (ACUTE ONLY): 21 min   Charges:   PT Evaluation $PT Eval Low Complexity: 1 Low          Lillia Pauls, PT, DPT Acute Rehabilitation Services Pager [phone] Office [phone]   Norval Morton 02/10/2021, 4:35 PM

## 2021-02-10 NOTE — Progress Notes (Addendum)
PROGRESS NOTE    Luis Carter  ZOX:[id number] DOB: Apr 26, 1946 DOA: 02/08/2021 PCP: Fleet Contras, MD    Brief Narrative:  Luis Carter was admitted to the hospital with a working diagnosis of COPD exacerbation.  75 year old male with past medical history for COPD, hypertension, type 2 diabetes mellitus and dyslipidemia who presented with dyspnea.  24 hours of worsening symptoms, productive cough and dyspnea, associated with fever, polyuria, and dysuria.  On his initial physical examination his blood pressure was 139/78, heart rate 92, temperature 102.1 F, respiratory rate 17, oxygen saturation 98%, his lungs had bilateral, diffuse wheezing, positive right side rales, heart S1-S2, present, rhythmic, soft abdomen, no lower extremity edema.  Sodium 134, potassium 5.8, chloride 102, bicarb 21, glucose 232, BUN 27, creatinine 1.74, white count 13.8, hemoglobin 10.8, hematocrit 35.3, platelets 223. SARS COVID-19 negative  Urinalysis specific gravity 1.013, 21-50 white cells, 6-10 red cells.  Large leukocytes.  Negative nitrates.  Chest radiograph, personally reviewed, faint right base atelectasis.  EKG 87 bpm, normal axis, normal intervals, sinus rhythm, no significant ST segment or T wave changes.  Assessment & Plan:   Principal Problem:   COPD (chronic obstructive pulmonary disease) (HCC) Active Problems:   Diabetes mellitus with renal complications (HCC)   Essential hypertension   Chronic renal disease, stage III (HCC)   Hyperkalemia   Acute COPD exacerbation, chronic hypoxemic respiratory failure. Patient reports improvement in dyspnea but not yet back to baseline. Uses a cane for ambulation and 2 L/min of supplemental 02 per [place].  Today oxygenation at rest is 93% on room air, continue to have rhonchi and wheezing.  No infiltrate on chest film, pneumonia has been ruled out.   Plan to continue aggressive bronchodilator therapy, systemic and inhaled steroids. Add 500 mg  azithromycin daily for 5 days, (discontinue ceftriaxone)  antitussive agents and airway clearing techniques with flutter valve and incentive spirometer.  Out of bed to chair tid with meals, PT and OT evaluation. Nutrition consultation.   2. HTN Continue blood pressure control with amlodipine  3. T2DM and dyslipidemia. Continue glucose control and monitoring with insulin sliding scale.  Continue with atorvastatin   Diabetic neuropathy, continue with gabapentin   4. Reactive leukocytosis. Cell count 15.4, no signs of systemic bacterial pneumonia. Continue to hold on antibiotic therapy.  5. AKI on CKD stage 3a complicated with hyperkalemia and hypomagnesemia Renal function with serum cr at 1,63 with K 5,6 and serum bicarbonate 22.  Plan to add sodium zirconium 10 mg tid x 3 doses and check renal function in am, including Mg    Patient continue to be at high risk for worsening COPD exacerbation   Status is: Inpatient  Remains inpatient appropriate because:Hemodynamically unstable and Inpatient level of care appropriate due to severity of illness  Dispo: The patient is from: Home              Anticipated d/c is to: Home              Patient currently is not medically stable to d/c.   Difficult to place patient No   DVT prophylaxis: Enoxaparin   Code Status:   full  Family Communication:  No family at the bedside     Antimicrobials:  Azithromycin     Subjective: Patient is feeling better, but not yet back to baseline, continue to have dyspnea and congestion. Has ambulated to the restroom but not in the hallway.   Objective: Vitals:   02/09/21 2336 02/10/21 0600 02/10/21  0829 02/10/21 0901  BP: 120/73 120/90  (!) 128/55  Pulse: 65 (!) 53  (!) 54  Resp: 17 16    Temp: 98.1 F (36.7 C) (!) 97.5 F (36.4 C)  (!) 97.4 F (36.3 C)  TempSrc: Oral Oral  Oral  SpO2: 98% 99% 93% 100%  Weight:      Height:        Intake/Output Summary (Last 24 hours) at 02/10/2021 1025 Last  data filed at 02/10/2021 0600 Gross per 24 hour  Intake 470 ml  Output 501 ml  Net -31 ml   Filed Weights   02/08/21 2106 02/09/21 1622  Weight: 77.1 kg 80.4 kg    Examination:   General: Not in pain or dyspnea  Neurology: Awake and alert, non focal  E ENT: no pallor, no icterus, oral mucosa moist Cardiovascular: No JVD. S1-S2 present, rhythmic, no gallops, rubs, or murmurs. No lower extremity edema. Pulmonary: positive breath sounds bilaterally, positive expiratory  wheezing, and bilateral rhonchi, no frank rales. Gastrointestinal. Abdomen soft and non tender Skin. No rashes Musculoskeletal: no joint deformities     Data Reviewed: I have personally reviewed following labs and imaging studies  CBC: Recent Labs  Lab 02/08/21 2112 02/09/21 0416 02/10/21 0056  WBC 11.8* 13.8* 15.4*  NEUTROABS 8.3*  --  12.9*  HGB 11.9* 10.8* 10.3*  HCT 38.0* 35.3* 33.2*  MCV 86.4 87.4 86.9  PLT 223 223 220   Basic Metabolic Panel: Recent Labs  Lab 02/08/21 2112 02/09/21 0416 02/10/21 0056  NA 136 134* 135  K 5.3* 5.8* 5.6*  CL 103 102 104  CO2 23 21* 22  GLUCOSE 110* 232* 220*  BUN 28* 27* 39*  CREATININE 1.82* 1.74* 1.63*  CALCIUM 9.0 8.8* 8.8*  MG 1.5*  --   --    GFR: Estimated Creatinine Clearance: 38 mL/min (A) (by C-G formula based on SCr of 1.63 mg/dL (H)). Liver Function Tests: Recent Labs  Lab 02/08/21 2112  AST 33  ALT 18  ALKPHOS 81  BILITOT 0.6  PROT 7.7  ALBUMIN 3.8   No results for input(s): LIPASE, AMYLASE in the last 168 hours. No results for input(s): AMMONIA in the last 168 hours. Coagulation Profile: Recent Labs  Lab 02/08/21 2112  INR 1.0   Cardiac Enzymes: No results for input(s): CKTOTAL, CKMB, CKMBINDEX, TROPONINI in the last 168 hours. BNP (last 3 results) No results for input(s): PROBNP in the last 8760 hours. HbA1C: No results for input(s): HGBA1C in the last 72 hours. CBG: Recent Labs  Lab 02/09/21 0726 02/09/21 1253  02/09/21 1653 02/09/21 2137 02/10/21 0558  GLUCAP 234* 246* 197* 152* 156*   Lipid Profile: No results for input(s): CHOL, HDL, LDLCALC, TRIG, CHOLHDL, LDLDIRECT in the last 72 hours. Thyroid Function Tests: No results for input(s): TSH, T4TOTAL, FREET4, T3FREE, THYROIDAB in the last 72 hours. Anemia Panel: No results for input(s): VITAMINB12, FOLATE, FERRITIN, TIBC, IRON, RETICCTPCT in the last 72 hours.    Radiology Studies: I have reviewed all of the imaging during this hospital visit personally     Scheduled Meds:  amLODipine  5 mg Oral Daily   atorvastatin  20 mg Oral QPM   enoxaparin (LOVENOX) injection  40 mg Subcutaneous Q24H   gabapentin  300 mg Oral TID   insulin aspart  0-15 Units Subcutaneous TID WC   ipratropium-albuterol  3 mL Nebulization TID   mometasone-formoterol  2 puff Inhalation BID   montelukast  10 mg Oral QHS  predniSONE  40 mg Oral Q breakfast   Continuous Infusions:  azithromycin Stopped (02/09/21 2235)   cefTRIAXone (ROCEPHIN)  IV Stopped (02/09/21 2115)     LOS: 2 days        Himmat Enberg Annett Gula, MD

## 2021-02-11 DIAGNOSIS — E0821 Diabetes mellitus due to underlying condition with diabetic nephropathy: Secondary | ICD-10-CM | POA: Diagnosis not present

## 2021-02-11 DIAGNOSIS — E440 Moderate protein-calorie malnutrition: Secondary | ICD-10-CM

## 2021-02-11 DIAGNOSIS — N1832 Chronic kidney disease, stage 3b: Secondary | ICD-10-CM | POA: Diagnosis not present

## 2021-02-11 DIAGNOSIS — E875 Hyperkalemia: Secondary | ICD-10-CM

## 2021-02-11 DIAGNOSIS — I10 Essential (primary) hypertension: Secondary | ICD-10-CM | POA: Diagnosis not present

## 2021-02-11 DIAGNOSIS — J441 Chronic obstructive pulmonary disease with (acute) exacerbation: Secondary | ICD-10-CM | POA: Diagnosis not present

## 2021-02-11 LAB — BASIC METABOLIC PANEL
Anion gap: 10 (ref 5–15)
BUN: 42 mg/dL — ABNORMAL HIGH (ref 8–23)
CO2: 23 mmol/L (ref 22–32)
Calcium: 9 mg/dL (ref 8.9–10.3)
Chloride: 104 mmol/L (ref 98–111)
Creatinine, Ser: 1.82 mg/dL — ABNORMAL HIGH (ref 0.61–1.24)
GFR, Estimated: 38 mL/min — ABNORMAL LOW (ref >60)
Glucose, Bld: 177 mg/dL — ABNORMAL HIGH (ref 70–99)
Potassium: 4.4 mmol/L (ref 3.5–5.1)
Sodium: 137 mmol/L (ref 135–145)

## 2021-02-11 LAB — HEMOGLOBIN A1C
Hgb A1c MFr Bld: 6 % — ABNORMAL HIGH (ref 4.8–5.6)
Mean Plasma Glucose: 126 mg/dL

## 2021-02-11 LAB — MAGNESIUM: Magnesium: 2.4 mg/dL (ref 1.7–2.4)

## 2021-02-11 LAB — GLUCOSE, CAPILLARY
Glucose-Capillary: 147 mg/dL — ABNORMAL HIGH (ref 70–99)
Glucose-Capillary: 118 mg/dL — ABNORMAL HIGH (ref 70–99)
Glucose-Capillary: 187 mg/dL — ABNORMAL HIGH (ref 70–99)
Glucose-Capillary: 170 mg/dL — ABNORMAL HIGH (ref 70–99)

## 2021-02-11 MED ORDER — ALPRAZOLAM 0.5 MG PO TABS: 0.5000 mg | ORAL_TABLET | Freq: Three times a day (TID) | ORAL | Status: DC | PRN

## 2021-02-11 MED ORDER — FLUTICASONE PROPIONATE 50 MCG/ACT NA SUSP
2.0000 | Freq: Two times a day (BID) | NASAL | Status: DC
  Administered 2021-02-11 – 2021-02-13 (×4): 2 via NASAL
  Filled 2021-02-11: qty 16

## 2021-02-11 MED ORDER — ENSURE ENLIVE PO LIQD
237.0000 mL | Freq: Two times a day (BID) | ORAL | Status: DC
  Administered 2021-02-11 – 2021-02-13 (×4): 237 mL via ORAL

## 2021-02-11 MED ORDER — SALINE SPRAY 0.65 % NA SOLN
1.0000 | Freq: Four times a day (QID) | NASAL | Status: DC | PRN
  Filled 2021-02-11: qty 44

## 2021-02-11 MED ORDER — ADULT MULTIVITAMIN W/MINERALS CH
1.0000 | ORAL_TABLET | Freq: Every day | ORAL | Status: DC
  Administered 2021-02-11 – 2021-02-13 (×3): 1 via ORAL
  Filled 2021-02-11 (×3): qty 1

## 2021-02-11 NOTE — Progress Notes (Signed)
Initial Nutrition Assessment  DOCUMENTATION CODES:   Non-severe (moderate) malnutrition in context of chronic illness  INTERVENTION:   Ensure Enlive po BID, each supplement provides 350 kcal and 20 grams of protein MVI with minerals daily  NUTRITION DIAGNOSIS:   Moderate Malnutrition related to chronic illness (COPD) as evidenced by mild muscle depletion, mild fat depletion.  GOAL:   Patient will meet greater than or equal to 90% of their needs  MONITOR:   PO intake, Supplement acceptance, Labs  REASON FOR ASSESSMENT:   Consult Assessment of nutrition requirement/status  ASSESSMENT:   75 yo male admitted with COPD exacerbation. PMH includes COPD, HTN, DM-2, HLD, CKD stage III.  Patient reports variable intake at home (lives with his sister and her husband prepares his meals). He usually eats breakfast and dinner, but not always lunch. He c/o poor appetite since 2022-11-21, when his son passed away. Since admission, he has been eating very well, consuming 100% of meals. Lunch tray at bedside, 100% of meal consumed.    Diet: heart healthy, carbohydrate modified Meal intakes: 100%  Labs reviewed.  CBG: 147-118  Medications reviewed and include Novolog, prednisone.  Weight history reviewed. Patient reports that his weight fluctuates. Currently 80.4 kg, up from 63.5 kg 04/19/20.   NUTRITION - FOCUSED PHYSICAL EXAM:  Flowsheet Row Most Recent Value  Orbital Region No depletion  Upper Arm Region Mild depletion  Thoracic and Lumbar Region Mild depletion  Buccal Region Mild depletion  Temple Region Mild depletion  Clavicle Bone Region Mild depletion  Clavicle and Acromion Bone Region Mild depletion  Scapular Bone Region Mild depletion  Dorsal Hand Moderate depletion  Patellar Region Mild depletion  Anterior Thigh Region Mild depletion  Posterior Calf Region Mild depletion  Edema (RD Assessment) None  Hair Reviewed  Eyes Reviewed  Mouth Reviewed  Skin Reviewed  Nails  Reviewed       Diet Order:   Diet Order             Diet heart healthy/carb modified Room service appropriate? Yes; Fluid consistency: Thin  Diet effective now                   EDUCATION NEEDS:   Education needs have been addressed  Skin:  Skin Assessment: Reviewed RN Assessment  Last BM:  6/20  Height:   Ht Readings from Last 1 Encounters:  02/09/21 5\' 9"  (1.753 m)    Weight:   Wt Readings from Last 1 Encounters:  02/09/21 80.4 kg    Ideal Body Weight:  72.7 kg  BMI:  Body mass index is 26.18 kg/m.  Estimated Nutritional Needs:   Kcal:  1900-2100  Protein:  95-110 gm  Fluid:  >/= 1.9 L    02/11/21, RD, LDN, CNSC Please refer to Amion for contact information.

## 2021-02-11 NOTE — Progress Notes (Signed)
PROGRESS NOTE    DWAIN HUHN  ZOX:[id number] DOB: Feb 24, 1946 DOA: 02/08/2021 PCP: Fleet Contras, MD    Brief Narrative:  Mr. Tietze was admitted to the hospital with a working diagnosis of COPD exacerbation.   75 year old male with past medical history for COPD, hypertension, type 2 diabetes mellitus and dyslipidemia who presented with dyspnea.  24 hours of worsening symptoms, productive cough and dyspnea, associated with fever, polyuria, and dysuria.  On his initial physical examination his blood pressure was 139/78, heart rate 92, temperature 102.1 F, respiratory rate 17, oxygen saturation 98%, his lungs had bilateral, diffuse wheezing, positive right side rales, heart S1-S2, present, rhythmic, soft abdomen, no lower extremity edema.   Sodium 134, potassium 5.8, chloride 102, bicarb 21, glucose 232, BUN 27, creatinine 1.74, white count 13.8, hemoglobin 10.8, hematocrit 35.3, platelets 223. SARS COVID-19 negative   Urinalysis specific gravity 1.013, 21-50 white cells, 6-10 red cells.  Large leukocytes.  Negative nitrates.   Chest radiograph, personally reviewed, faint right base atelectasis.   EKG 87 bpm, normal axis, normal intervals, sinus rhythm, no significant ST segment or T wave changes.   Patient placed on aggressive bronchodilator therapy, steroids and airway clearing techniques. Pneumonia has been ruled out.   Assessment & Plan:   Principal Problem:   COPD (chronic obstructive pulmonary disease) (HCC) Active Problems:   Diabetes mellitus with renal complications (HCC)   Essential hypertension   Chronic renal disease, stage III (HCC)   Hyperkalemia   Malnutrition of moderate degree     Acute COPD exacerbation, chronic hypoxemic respiratory failure.  Patient continue to have copious secretions and cough. Positive nasal congestion. Continue to be on room air. Positive bilateral rhonchi and wheezing.    On aggressive bronchodilator therapy, systemic and inhaled  steroids. Continue with PO azithromycin daily for 5 days, antitussive agents and airway clearing techniques with flutter valve and incentive spirometer. Add saline nasal spry and flonase for sinus congestion.   Continue to increase mobility, will need home health services, PT and OT.    2. HTN On amlodipine for blood pressure control.    3. T2DM and dyslipidemia. On insulin sliding scalae for glucose control and monitoring. Fasting glucose 177, capillary 147 and 118.  On atorvastatin   For diabetic neuropathy on gabapentin   4. Reactive leukocytosis. Continue to hold antibiotic therapy, check cell count in am.    5. AKI on CKD stage 3a complicated with hyperkalemia and hypomagnesemia  Stable renal function with serum cr at 1,82 with K at down to 4,4 and serum bicarbonate at 23. Mg is up to 2,4 Continue close follow up renal function monitoring   6. Moderate calorie protein malnutrition. Continue with nutritional supplements.   7. Anxiety. Add as needed alprazolam for anxiety. Patient with recent family loss.   Patient continue to be at high risk for worsening COPD exacerbation   Status is: Inpatient  Remains inpatient appropriate because:Inpatient level of care appropriate due to severity of illness  Dispo: The patient is from: Home              Anticipated d/c is to: Home              Patient currently is not medically stable to d/c.   Difficult to place patient No   DVT prophylaxis: Enoxaparin   Code Status:   Full  Family Communication:  I spoke over the phone with the patient's sister about patient's  condition, plan of care, prognosis and all questions  were addressed.     Nutrition Status: Nutrition Problem: Moderate Malnutrition Etiology: chronic illness (COPD) Signs/Symptoms: mild muscle depletion, mild fat depletion Interventions: Ensure Enlive (each supplement provides 350kcal and 20 grams of protein), MVI    Subjective: Patient is feeling better, but not yet  back to baseline, continue to have congestion and cough. He is out of bed to chair. No nausea or vomiting,   Objective: Vitals:   02/10/21 2234 02/11/21 0446 02/11/21 0746 02/11/21 1231  BP: (!) 117/53 (!) 147/71  131/67  Pulse: 62 (!) 51  63  Resp: 19 16  16   Temp: 98.3 F (36.8 C) 97.6 F (36.4 C)  (!) 97.5 F (36.4 C)  TempSrc: Oral   Oral  SpO2: 100% 100% 95% 98%  Weight:      Height:        Intake/Output Summary (Last 24 hours) at 02/11/2021 1424 Last data filed at 02/11/2021 0449 Gross per 24 hour  Intake 480 ml  Output 1080 ml  Net -600 ml   Filed Weights   02/08/21 2106 02/09/21 1622  Weight: 77.1 kg 80.4 kg    Examination:   General: Not in pain or dyspnea, deconditioned  Neurology: Awake and alert, non focal  E ENT: no pallor, no icterus, oral mucosa moist Cardiovascular: No JVD. S1-S2 present, rhythmic, no gallops, rubs, or murmurs. No lower extremity edema. Pulmonary: positive breath sounds bilaterally, with diffuse bilateral wheezing and rhonchi. Gastrointestinal. Abdomen soft and non tender Skin. No rashes Musculoskeletal: no joint deformities     Data Reviewed: I have personally reviewed following labs and imaging studies  CBC: Recent Labs  Lab 02/08/21 2112 02/09/21 0416 02/10/21 0056  WBC 11.8* 13.8* 15.4*  NEUTROABS 8.3*  --  12.9*  HGB 11.9* 10.8* 10.3*  HCT 38.0* 35.3* 33.2*  MCV 86.4 87.4 86.9  PLT 223 223 220   Basic Metabolic Panel: Recent Labs  Lab 02/08/21 2112 02/09/21 0416 02/10/21 0056 02/11/21 0507  NA 136 134* 135 137  K 5.3* 5.8* 5.6* 4.4  CL 103 102 104 104  CO2 23 21* 22 23  GLUCOSE 110* 232* 220* 177*  BUN 28* 27* 39* 42*  CREATININE 1.82* 1.74* 1.63* 1.82*  CALCIUM 9.0 8.8* 8.8* 9.0  MG 1.5*  --   --  2.4   GFR: Estimated Creatinine Clearance: 34 mL/min (A) (by C-G formula based on SCr of 1.82 mg/dL (H)). Liver Function Tests: Recent Labs  Lab 02/08/21 2112  AST 33  ALT 18  ALKPHOS 81  BILITOT 0.6   PROT 7.7  ALBUMIN 3.8   No results for input(s): LIPASE, AMYLASE in the last 168 hours. No results for input(s): AMMONIA in the last 168 hours. Coagulation Profile: Recent Labs  Lab 02/08/21 2112  INR 1.0   Cardiac Enzymes: No results for input(s): CKTOTAL, CKMB, CKMBINDEX, TROPONINI in the last 168 hours. BNP (last 3 results) No results for input(s): PROBNP in the last 8760 hours. HbA1C: Recent Labs    02/09/21 0416  HGBA1C 6.0*   CBG: Recent Labs  Lab 02/10/21 1110 02/10/21 1607 02/10/21 2058 02/11/21 0611 02/11/21 1059  GLUCAP 157* 146* 184* 147* 118*   Lipid Profile: No results for input(s): CHOL, HDL, LDLCALC, TRIG, CHOLHDL, LDLDIRECT in the last 72 hours. Thyroid Function Tests: No results for input(s): TSH, T4TOTAL, FREET4, T3FREE, THYROIDAB in the last 72 hours. Anemia Panel: No results for input(s): VITAMINB12, FOLATE, FERRITIN, TIBC, IRON, RETICCTPCT in the last 72 hours.    Radiology Studies:  I have reviewed all of the imaging during this hospital visit personally     Scheduled Meds:  amLODipine  5 mg Oral Daily   atorvastatin  20 mg Oral QPM   azithromycin  500 mg Oral Daily   enoxaparin (LOVENOX) injection  40 mg Subcutaneous Q24H   feeding supplement  237 mL Oral BID BM   gabapentin  300 mg Oral TID   insulin aspart  0-15 Units Subcutaneous TID WC   mometasone-formoterol  2 puff Inhalation BID   montelukast  10 mg Oral QHS   multivitamin with minerals  1 tablet Oral Daily   predniSONE  40 mg Oral Q breakfast   Continuous Infusions:   LOS: 3 days        Earnestene Angello Annett Gula, MD

## 2021-02-11 NOTE — TOC Initial Note (Addendum)
Transition of Care [facility]) - Initial/Assessment Note    Patient Details  Name: Luis Carter MRN: [id number] Date of Birth: [birth year]  Transition of Care [facility]) CM/SW Contact:    Kingsley Plan, RN Phone Number: 02/11/2021, 2:20 PM  Clinical Narrative:                 Spoke to patient at bedside. Confirmed face sheet information. Patient from home with sister. PT/OT recommending home health PT/OT and tub transfer bench. Patient has no preference.   Ordered tub transfer bench with Velna Hatchet with [employer] [facility].   Barbara Cower with [facility]) reviewing clinicals to see if he can accept referral. Await determination.  Barbara Cower can accept for PT/OT and SW  Spoke to sister Sallye Ober she is aware of plan   Expected Discharge Plan: Home w Home Health Services     Patient Goals and CMS Choice Patient states their goals for this hospitalization and ongoing recovery are:: to return to home CMS Medicare.gov Compare Post Acute Care list provided to:: Patient Choice offered to / list presented to : Patient  Expected Discharge Plan and Services Expected Discharge Plan: Home w Home Health Services   Discharge Planning Services: CM Consult Post Acute Care Choice: Home Health, Durable Medical Equipment Living arrangements for the past 2 months: Single Family Home                 DME Arranged: Tub bench (tub transfer bench) DME Agency: [employer] Date DME Agency Contacted: 02/11/21 Time DME Agency Contacted: 1419 Representative spoke with at DME Agency: Velna Hatchet HH Arranged: PT, OT          Prior Living Arrangements/Services Living arrangements for the past 2 months: Single Family Home Lives with:: Siblings Patient language and need for interpreter reviewed:: Yes Do you feel safe going back to the place where you live?: Yes      Need for Family Participation in Patient Care: Yes (Comment) Care giver support system in place?: Yes (comment) Current home services:  DME Criminal Activity/Legal Involvement Pertinent to Current Situation/Hospitalization: No - Comment as needed  Activities of Daily Living Home Assistive Devices/Equipment: None ADL Screening (condition at time of admission) Patient's cognitive ability adequate to safely complete daily activities?: Yes Is the patient deaf or have difficulty hearing?: No Does the patient have difficulty seeing, even when wearing glasses/contacts?: No Does the patient have difficulty concentrating, remembering, or making decisions?: No Patient able to express need for assistance with ADLs?: Yes Does the patient have difficulty dressing or bathing?: No Independently performs ADLs?: Yes (appropriate for developmental age) Does the patient have difficulty walking or climbing stairs?: No Weakness of Legs: None Weakness of Arms/Hands: None  Permission Sought/Granted   Permission granted to share information with : No              Emotional Assessment Appearance:: Appears stated age Attitude/Demeanor/Rapport: Engaged Affect (typically observed): Accepting Orientation: : Oriented to Self, Oriented to Place, Oriented to  Time, Oriented to Situation Alcohol / Substance Use: Not Applicable Psych Involvement: No (comment)  Admission diagnosis:  Pneumonia [J18.9] Dyspnea [R06.00] Sepsis due to pneumonia (HCC) [J18.9, A41.9] Community acquired pneumonia, unspecified laterality [J18.9] Patient Active Problem List   Diagnosis Date Noted   Hyperkalemia 02/10/2021   Dyspnea 02/08/2021   Community acquired pneumonia of right upper lobe of lung    Respiratory failure with hypoxia and hypercapnia (HCC) 10/11/2018   Acute pain of left shoulder 10/15/2016   Pain in right leg  10/15/2016   Weakness of right leg 10/15/2016   Cardiomyopathy (HCC) 01/25/2016   Chronic renal disease, stage III (HCC) 12/13/2015   Abnormal echocardiogram 12/13/2015   Syncope 12/12/2015   Tobacco use disorder 09/07/2015   Gout  01/27/2012   Cocaine use 12/22/2011   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   Essential hypertension 04/14/2007   Asthma 04/14/2007   COPD (chronic obstructive pulmonary disease) (HCC) 04/14/2007   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

## 2021-02-11 NOTE — Evaluation (Addendum)
Occupational Therapy Evaluation Patient Details Name: Luis Carter MRN: [id number] DOB: 06/09/1946 Today's Date: 02/11/2021    History of Present Illness Pt is a 75 y.o. M who presents with acute COPD exacerbation. Significant PMH: DM2, COPD, HTN.   Clinical Impression   Patient evaluated by Occupational Therapy with no further acute OT needs identified. All education has been completed including handout and discussion on energy conservation, and the patient has no further questions.  See below for any follow-up Occupational Therapy or equipment needs. OT is signing off. Thank you for this referral.     Follow Up Recommendations  Home health OT;Other (comment)    Equipment Recommendations  Tub Transfer bench    Recommendations for Other Services       Precautions / Restrictions Precautions Precautions: Fall Restrictions Weight Bearing Restrictions: No      Mobility Bed Mobility Overal bed mobility: Modified Independent                  Transfers Overall transfer level: Needs assistance Equipment used: Straight cane Transfers: Sit to/from Stand Sit to Stand: Supervision              Balance Overall balance assessment: Needs assistance Sitting-balance support: Feet supported Sitting balance-Leahy Scale: Good     Standing balance support: During functional activity;Single extremity supported Standing balance-Leahy Scale: Fair Standing balance comment: Pt reports feeling unsteady and "off balance" but no loss of balance observed.                           ADL either performed or assessed with clinical judgement   ADL Overall ADL's : At baseline                                       General ADL Comments: Pt able to perform LE dressing in sitting andf adjust his gowns in standing, perform toileting and toilet transfer and stand at sink for hand hygiene all supervision to Mod I with pt agreeing that this is baseline for  him.  Pt was educated on energy conservation techniques to maximize independence at home and a handout with possible AE/DME recommendations was provided and reviewed.     Vision   Vision Assessment?: No apparent visual deficits     Perception     Praxis      Pertinent Vitals/Pain Pain Assessment: No/denies pain     Hand Dominance Right   Extremity/Trunk Assessment Upper Extremity Assessment Upper Extremity Assessment: Overall WFL for tasks assessed   Lower Extremity Assessment Lower Extremity Assessment: Generalized weakness       Communication Communication Communication: No difficulties   Cognition Arousal/Alertness: Awake/alert Behavior During Therapy: WFL for tasks assessed/performed Overall Cognitive Status: Within Functional Limits for tasks assessed                                 General Comments: Ox4   General Comments       Exercises     Shoulder Instructions      Home Living Family/patient expects to be discharged to:: Private residence Living Arrangements: Other relatives (Sister) Available Help at Discharge: Available 24 hours/day;Family Type of Home: House Home Access: Level entry     Home Layout: Multi-level;1/2 bath on main level Alternate Level Stairs-Number of Steps: Pt reports  he has a 1/2 bath on main floor and a flight and a half from main floor to his full bath. He has been sponge bathing for ~2 months because he has been unable to make it up/down the stairs.   Bathroom Shower/Tub: IT trainer: Standard     Home Equipment: Cane - single point          Prior Functioning/Environment Level of Independence: Independent with assistive device(s)        Comments: uses cane, sister/BIL assist with IADL's, does not drive and sister provides transportation.  Pt endorses 2 falls in past year and reports feeling increasingly unsteady..        OT Problem List: Impaired balance (sitting  and/or standing);Decreased activity tolerance      OT Treatment/Interventions:      OT Goals(Current goals can be found in the care plan section) Acute Rehab OT Goals Patient Stated Goal: Be able to get to upstairs of home for full shower. OT Goal Formulation: With patient  OT Frequency:     Barriers to D/C:            Co-evaluation              AM-PAC OT "6 Clicks" Daily Activity     Outcome Measure Help from another person eating meals?: None Help from another person taking care of personal grooming?: None Help from another person toileting, which includes using toliet, bedpan, or urinal?: None Help from another person bathing (including washing, rinsing, drying)?: A Little Help from another person to put on and taking off regular upper body clothing?: None Help from another person to put on and taking off regular lower body clothing?: None 6 Click Score: 23   End of Session Equipment Utilized During Treatment:  (Pt's own cane)  Activity Tolerance: Patient tolerated treatment well Patient left: in chair;with chair alarm set;with call bell/phone within reach  OT Visit Diagnosis: History of falling (Z91.81);Repeated falls (R29.6);Unsteadiness on feet (R26.81)                Time: 8527-7824 OT Time Calculation (min): 28 min Charges:  OT General Charges $OT Visit: 1 Visit OT Evaluation $OT Eval Low Complexity: 1 Low  Luis Carter, OT Acute Rehab Services Office: [phone] 02/11/2021  Luis Carter 02/11/2021, 1:03 PM

## 2021-02-12 DIAGNOSIS — I10 Essential (primary) hypertension: Secondary | ICD-10-CM | POA: Diagnosis not present

## 2021-02-12 DIAGNOSIS — J441 Chronic obstructive pulmonary disease with (acute) exacerbation: Secondary | ICD-10-CM | POA: Diagnosis not present

## 2021-02-12 DIAGNOSIS — E0821 Diabetes mellitus due to underlying condition with diabetic nephropathy: Secondary | ICD-10-CM | POA: Diagnosis not present

## 2021-02-12 DIAGNOSIS — E875 Hyperkalemia: Secondary | ICD-10-CM | POA: Diagnosis not present

## 2021-02-12 LAB — CBC WITH DIFFERENTIAL/PLATELET
Abs Immature Granulocytes: 0.14 10*3/uL — ABNORMAL HIGH (ref 0.00–0.07)
Basophils Absolute: 0 10*3/uL (ref 0.0–0.1)
Basophils Relative: 0 %
Eosinophils Absolute: 0 10*3/uL (ref 0.0–0.5)
Eosinophils Relative: 0 %
HCT: 33 % — ABNORMAL LOW (ref 39.0–52.0)
Hemoglobin: 10.6 g/dL — ABNORMAL LOW (ref 13.0–17.0)
Immature Granulocytes: 1 %
Lymphocytes Relative: 21 %
Lymphs Abs: 2.5 10*3/uL (ref 0.7–4.0)
MCH: 27.2 pg (ref 26.0–34.0)
MCHC: 32.1 g/dL (ref 30.0–36.0)
MCV: 84.6 fL (ref 80.0–100.0)
Monocytes Absolute: 0.9 10*3/uL (ref 0.1–1.0)
Monocytes Relative: 8 %
Neutro Abs: 8.1 10*3/uL — ABNORMAL HIGH (ref 1.7–7.7)
Neutrophils Relative %: 70 %
Platelets: 237 10*3/uL (ref 150–400)
RBC: 3.9 MIL/uL — ABNORMAL LOW (ref 4.22–5.81)
RDW: 14.5 % (ref 11.5–15.5)
WBC: 11.6 10*3/uL — ABNORMAL HIGH (ref 4.0–10.5)
nRBC: 0 % (ref 0.0–0.2)

## 2021-02-12 LAB — GLUCOSE, CAPILLARY
Glucose-Capillary: 165 mg/dL — ABNORMAL HIGH (ref 70–99)
Glucose-Capillary: 122 mg/dL — ABNORMAL HIGH (ref 70–99)
Glucose-Capillary: 242 mg/dL — ABNORMAL HIGH (ref 70–99)
Glucose-Capillary: 257 mg/dL — ABNORMAL HIGH (ref 70–99)

## 2021-02-12 LAB — URINE CULTURE: Culture: >=100000 — AB

## 2021-02-12 NOTE — Care Management (Addendum)
Patient's sister asked MD for hospital bed. NCM secure chatted PT for need . Also , sister asked MD to arrange transportation home at discharge. NCM also asked PT if patient can transport via car ( [facility] transport) or if he will need ambulance.   Spoke with Velna Hatchet with [employer] [facility], they have already delivered tub bench to patient's room, but they will come pick it up and send with hospital bed. Added walker .   Per PT patient can transport home via [employer] , car , he will not need assistance.

## 2021-02-12 NOTE — Progress Notes (Addendum)
PROGRESS NOTE    Luis Carter  KKX:[id number] DOB: 1946/01/07 DOA: 02/08/2021 PCP: Fleet Contras, MD    Brief Narrative:  [name] was admitted to the hospital with a working diagnosis of COPD exacerbation.   75 year old male with past medical history for COPD, hypertension, type 2 diabetes mellitus and dyslipidemia who presented with dyspnea.  24 hours of worsening symptoms, productive cough and dyspnea, associated with fever, polyuria, and dysuria.  On his initial physical examination his blood pressure was 139/78, heart rate 92, temperature 102.1 F, respiratory rate 17, oxygen saturation 98%, his lungs had bilateral, diffuse wheezing, positive right side rales, heart S1-S2, present, rhythmic, soft abdomen, no lower extremity edema.   Sodium 134, potassium 5.8, chloride 102, bicarb 21, glucose 232, BUN 27, creatinine 1.74, white count 13.8, hemoglobin 10.8, hematocrit 35.3, platelets 223. SARS COVID-19 negative   Urinalysis specific gravity 1.013, 21-50 white cells, 6-10 red cells.  Large leukocytes.  Negative nitrates.   Chest radiograph, personally reviewed, faint right base atelectasis.   EKG 87 bpm, normal axis, normal intervals, sinus rhythm, no significant ST segment or T wave changes.   Patient placed on aggressive bronchodilator therapy, steroids and airway clearing techniques. Pneumonia has been ruled out.    Assessment & Plan:   Principal Problem:   COPD (chronic obstructive pulmonary disease) (HCC) Active Problems:   Diabetes mellitus with renal complications (HCC)   Essential hypertension   Chronic renal disease, stage III (HCC)   Hyperkalemia   Malnutrition of moderate degree      Acute COPD exacerbation, chronic hypoxemic respiratory failure. (no pneumonia and no sepsis) Secretions and congestion continue to improve, patient working with incentive spirometer and flutter valve.   Plan to continue with aggressive bronchodilator therapy, systemic and  inhaled steroids. Azithromycin daily for 5 days, antitussive agents and airway clearing techniques with flutter valve and incentive spirometer.  Tolerating well flonase and hypertonic saline spry.    Will arrange for home health services in preparation for discharge tomorrow if continue to improve.    2. HTN Continue with amlodipine for blood pressure control.    3. T2DM and dyslipidemia. Continue with insulin sliding scalae for glucose control and monitoring. Capillary glucose 170, 165, 122, tolerating po well.   Continue with atorvastatin   Diabetic neuropathy on gabapentin   4. Reactive leukocytosis. follow cell count is 11.6, no signs of systemic infection.   5. AKI on CKD stage 3a complicated with hyperkalemia and hypomagnesemia  Tolerating po well, plan to follow up renal function as outpatient.    6. Moderate calorie protein malnutrition. Nutritional supplements.   7. Anxiety. Today more calm, did not use alprazolam.     Status is: Inpatient  Remains inpatient appropriate because:Inpatient level of care appropriate due to severity of illness  Dispo: The patient is from: Home              Anticipated d/c is to: Home              Patient currently is not medically stable to d/c. Plan for possible dc home on 06/22   Difficult to place patient No    DVT prophylaxis: Enoxaparin   Code Status:   full  Family Communication:  I spoke over the phone with the patient's sister about patient's  condition, plan of care, prognosis and all questions were addressed.    Nutrition Status: Nutrition Problem: Moderate Malnutrition Etiology: chronic illness (COPD) Signs/Symptoms: mild muscle depletion, mild fat depletion Interventions: Ensure Enlive (  each supplement provides 350kcal and 20 grams of protein), MVI     Antimicrobials:  Azithromycin     Subjective: Patient with improvement of dyspnea but not yet back to baseline, continue with cough., no chest pain, no nausea or  vomiting.   Objective: Vitals:   02/11/21 2257 02/12/21 0509 02/12/21 0754 02/12/21 0850  BP: 139/77 132/71  126/61  Pulse: 66 (!) 45  61  Resp: 19 18    Temp: 97.8 F (36.6 C) 98.2 F (36.8 C)    TempSrc: Oral Oral    SpO2: 100% 100% 98% 100%  Weight:      Height:        Intake/Output Summary (Last 24 hours) at 02/12/2021 1217 Last data filed at 02/12/2021 0847 Gross per 24 hour  Intake 480 ml  Output 650 ml  Net -170 ml   Filed Weights   02/08/21 2106 02/09/21 1622  Weight: 77.1 kg 80.4 kg    Examination:   General: Not in pain or dyspnea, deconditioned  Neurology: Awake and alert, non focal  E ENT: no pallor, no icterus, oral mucosa moist Cardiovascular: No JVD. S1-S2 present, rhythmic, no gallops, rubs, or murmurs. No lower extremity edema. Pulmonary: positive breath sounds bilaterally, with no wheezing,  but bilateral rhonchi and rales. Gastrointestinal. Abdomen soft and non tender Skin. No rashes Musculoskeletal: no joint deformities     Data Reviewed: I have personally reviewed following labs and imaging studies  CBC: Recent Labs  Lab 02/08/21 2112 02/09/21 0416 02/10/21 0056 02/12/21 0210  WBC 11.8* 13.8* 15.4* 11.6*  NEUTROABS 8.3*  --  12.9* 8.1*  HGB 11.9* 10.8* 10.3* 10.6*  HCT 38.0* 35.3* 33.2* 33.0*  MCV 86.4 87.4 86.9 84.6  PLT 223 223 220 237   Basic Metabolic Panel: Recent Labs  Lab 02/08/21 2112 02/09/21 0416 02/10/21 0056 02/11/21 0507  NA 136 134* 135 137  K 5.3* 5.8* 5.6* 4.4  CL 103 102 104 104  CO2 23 21* 22 23  GLUCOSE 110* 232* 220* 177*  BUN 28* 27* 39* 42*  CREATININE 1.82* 1.74* 1.63* 1.82*  CALCIUM 9.0 8.8* 8.8* 9.0  MG 1.5*  --   --  2.4   GFR: Estimated Creatinine Clearance: 34 mL/min (A) (by C-G formula based on SCr of 1.82 mg/dL (H)). Liver Function Tests: Recent Labs  Lab 02/08/21 2112  AST 33  ALT 18  ALKPHOS 81  BILITOT 0.6  PROT 7.7  ALBUMIN 3.8   No results for input(s): LIPASE, AMYLASE in the  last 168 hours. No results for input(s): AMMONIA in the last 168 hours. Coagulation Profile: Recent Labs  Lab 02/08/21 2112  INR 1.0   Cardiac Enzymes: No results for input(s): CKTOTAL, CKMB, CKMBINDEX, TROPONINI in the last 168 hours. BNP (last 3 results) No results for input(s): PROBNP in the last 8760 hours. HbA1C: No results for input(s): HGBA1C in the last 72 hours. CBG: Recent Labs  Lab 02/11/21 1059 02/11/21 1603 02/11/21 2103 02/12/21 0606 02/12/21 1103  GLUCAP 118* 187* 170* 165* 122*   Lipid Profile: No results for input(s): CHOL, HDL, LDLCALC, TRIG, CHOLHDL, LDLDIRECT in the last 72 hours. Thyroid Function Tests: No results for input(s): TSH, T4TOTAL, FREET4, T3FREE, THYROIDAB in the last 72 hours. Anemia Panel: No results for input(s): VITAMINB12, FOLATE, FERRITIN, TIBC, IRON, RETICCTPCT in the last 72 hours.    Radiology Studies: I have reviewed all of the imaging during this hospital visit personally     Scheduled Meds:  amLODipine  5 mg Oral Daily   atorvastatin  20 mg Oral QPM   azithromycin  500 mg Oral Daily   enoxaparin (LOVENOX) injection  40 mg Subcutaneous Q24H   feeding supplement  237 mL Oral BID BM   fluticasone  2 spray Each Nare BID   gabapentin  300 mg Oral TID   insulin aspart  0-15 Units Subcutaneous TID WC   mometasone-formoterol  2 puff Inhalation BID   montelukast  10 mg Oral QHS   multivitamin with minerals  1 tablet Oral Daily   predniSONE  40 mg Oral Q breakfast   Continuous Infusions:   LOS: 4 days        Kourtnei Rauber Annett Gula, MD

## 2021-02-12 NOTE — Progress Notes (Signed)
Physical Therapy Treatment Patient Details Name: Luis Carter MRN: [id number] DOB: 14-Apr-1946 Today's Date: 02/12/2021    History of Present Illness Pt is a 75 y.o. M who presents with acute COPD exacerbation. Significant PMH: DM2, COPD, HTN.    PT Comments    Pt sound asleep on entry, but wakes easily. Pt is agreeable to therapy and looking forward to discharge tomorrow. Pt is limited in safe mobility by painful gait and L knee buckling in presence of decreased strength, balance and endurance. Pt is mod I for coming to EoB. Pt has SPC however needs min A for transfers and ambulation. Pt mobilize more safely and with less assist when using RW. Pt reports feeling more secure as well. Pt able to ambulate 100 feet with RW and is able to recover from L knee buckling with light min A. PT recommending d/c to sister's home via automobile tomorrow with RW.    Follow Up Recommendations  Supervision for mobility/OOB;Home health PT     Equipment Recommendations  Rolling walker with 5" wheels       Precautions / Restrictions Precautions Precautions: Fall Restrictions Weight Bearing Restrictions: No    Mobility  Bed Mobility Overal bed mobility: Modified Independent                  Transfers Overall transfer level: Needs assistance Equipment used: Straight cane;Rolling walker (2 wheeled) Transfers: Sit to/from Stand Sit to Stand: Supervision;Min guard;Min assist         General transfer comment: min A for initial standing with SPC, min guard for power up and steadying from commode with use of handrails and supervision with power up from bed with RW  Ambulation/Gait Ambulation/Gait assistance: Min assist Gait Distance (Feet): 100 Feet Assistive device: Rolling walker (2 wheeled);Straight cane Gait Pattern/deviations: Step-through pattern;Antalgic;Decreased stance time - right;Trunk flexed Gait velocity: decreased Gait velocity interpretation: <1.31 ft/sec, indicative of  household ambulator General Gait Details: Pt walked into bathroom with SPC and min A, very chaotic gait, switched to using RW and able to progress with light min A and 1x increased min A due to L knee buckling.         Balance Overall balance assessment: Needs assistance Sitting-balance support: Feet supported Sitting balance-Leahy Scale: Good     Standing balance support: During functional activity;Bilateral upper extremity supported;Single extremity supported Standing balance-Leahy Scale: Fair Standing balance comment: fair with static standing at least single UE support for dynamic balance                            Cognition Arousal/Alertness: Awake/alert Behavior During Therapy: WFL for tasks assessed/performed Overall Cognitive Status: Within Functional Limits for tasks assessed                                           General Comments General comments (skin integrity, edema, etc.): Pt found to be incontinent of urine which had soaked through his Depends, able to get to bathroom and clean himself up      Pertinent Vitals/Pain Pain Assessment: No/denies pain     PT Goals (current goals can now be found in the care plan section) Acute Rehab PT Goals Patient Stated Goal: Be able to get to upstairs of home for full shower. PT Goal Formulation: With patient Time For Goal Achievement: 02/24/21 Potential to Achieve  Goals: Good Progress towards PT goals: Progressing toward goals    Frequency    Min 3X/week      PT Plan Equipment recommendations need to be updated       AM-PAC PT "6 Clicks" Mobility   Outcome Measure  Help needed turning from your back to your side while in a flat bed without using bedrails?: None Help needed moving from lying on your back to sitting on the side of a flat bed without using bedrails?: None Help needed moving to and from a bed to a chair (including a wheelchair)?: A Little Help needed standing up from  a chair using your arms (e.g., wheelchair or bedside chair)?: A Little Help needed to walk in hospital room?: A Little Help needed climbing 3-5 steps with a railing? : A Little 6 Click Score: 20    End of Session Equipment Utilized During Treatment: Gait belt Activity Tolerance: Patient tolerated treatment well Patient left: in chair;with call bell/phone within reach;with chair alarm set Nurse Communication: Mobility status PT Visit Diagnosis: Unsteadiness on feet (R26.81);Other abnormalities of gait and mobility (R26.89)     Time: 8250-5397 PT Time Calculation (min) (ACUTE ONLY): 22 min  Charges:  $Gait Training: 8-22 mins                     [name] B. Beverely Risen PT, DPT Acute Rehabilitation Services Pager [phone] Office [phone]    [facility]on Alas [facility] 02/12/2021, 4:44 PM

## 2021-02-12 NOTE — Care Management (Signed)
    Durable Medical Equipment  (From admission, onward)           Start     Ordered   02/12/21 1320  For home use only DME Hospital bed  Once       Question Answer Comment  Length of Need 6 Months   The above medical condition requires: Patient requires the ability to reposition frequently   Head must be elevated greater than: 45 degrees   Bed type Semi-electric      02/12/21 1319   02/12/21 1319  For home use only DME Tub bench  Once        02/12/21 1319   02/11/21 1403  For home use only DME Other see comment  Once       Comments: Tub transfer bench  Question:  Length of Need  Answer:  Lifetime   02/11/21 1403

## 2021-02-13 DIAGNOSIS — N1832 Chronic kidney disease, stage 3b: Secondary | ICD-10-CM | POA: Diagnosis not present

## 2021-02-13 DIAGNOSIS — J441 Chronic obstructive pulmonary disease with (acute) exacerbation: Secondary | ICD-10-CM | POA: Diagnosis not present

## 2021-02-13 DIAGNOSIS — E0821 Diabetes mellitus due to underlying condition with diabetic nephropathy: Secondary | ICD-10-CM | POA: Diagnosis not present

## 2021-02-13 DIAGNOSIS — I10 Essential (primary) hypertension: Secondary | ICD-10-CM | POA: Diagnosis not present

## 2021-02-13 LAB — CULTURE, BLOOD (ROUTINE X 2)
Culture: NO GROWTH
Culture: NO GROWTH
Special Requests: ADEQUATE

## 2021-02-13 LAB — GLUCOSE, CAPILLARY: Glucose-Capillary: 251 mg/dL — ABNORMAL HIGH (ref 70–99)

## 2021-02-13 MED ORDER — COLCHICINE 0.6 MG PO TABS: 0.6000 mg | ORAL_TABLET | Freq: Two times a day (BID) | ORAL | Status: DC | PRN

## 2021-02-13 MED ORDER — ENSURE ENLIVE PO LIQD: 237.0000 mL | Freq: Two times a day (BID) | ORAL | 0 refills | Status: DC

## 2021-02-13 MED ORDER — FLUTICASONE PROPIONATE 50 MCG/ACT NA SUSP: 2.0000 | Freq: Two times a day (BID) | NASAL | 0 refills | Status: DC

## 2021-02-13 MED ORDER — ADULT MULTIVITAMIN W/MINERALS CH: 1.0000 | ORAL_TABLET | Freq: Every day | ORAL | 0 refills | Status: AC

## 2021-02-13 MED ORDER — FLUTICASONE-SALMETEROL 250-50 MCG/ACT IN AEPB: 1.0000 | INHALATION_SPRAY | Freq: Two times a day (BID) | RESPIRATORY_TRACT | 0 refills | Status: AC

## 2021-02-13 MED ORDER — IPRATROPIUM-ALBUTEROL 0.5-2.5 (3) MG/3ML IN SOLN: 3.0000 mL | Freq: Four times a day (QID) | RESPIRATORY_TRACT | 0 refills | Status: DC | PRN

## 2021-02-13 NOTE — Progress Notes (Addendum)
AVS reviewed with patient. RX given. Transportation called. Waiver signed. RN already informed pt's sister of patient's discharge. Waiver form faxed to [phone].
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
## 2021-02-13 NOTE — Plan of Care (Signed)

## 2021-02-22 DIAGNOSIS — J449 Chronic obstructive pulmonary disease, unspecified: Secondary | ICD-10-CM | POA: Diagnosis not present

## 2021-02-22 DIAGNOSIS — I10 Essential (primary) hypertension: Secondary | ICD-10-CM | POA: Diagnosis not present

## 2021-02-22 DIAGNOSIS — J9600 Acute respiratory failure, unspecified whether with hypoxia or hypercapnia: Secondary | ICD-10-CM | POA: Diagnosis not present

## 2021-02-22 DIAGNOSIS — J441 Chronic obstructive pulmonary disease with (acute) exacerbation: Secondary | ICD-10-CM | POA: Diagnosis not present

## 2021-02-22 DIAGNOSIS — J209 Acute bronchitis, unspecified: Secondary | ICD-10-CM | POA: Diagnosis not present

## 2021-02-22 DIAGNOSIS — E1142 Type 2 diabetes mellitus with diabetic polyneuropathy: Secondary | ICD-10-CM | POA: Diagnosis not present

## 2021-03-11 ENCOUNTER — Other Ambulatory Visit: Payer: Self-pay

## 2021-03-11 ENCOUNTER — Emergency Department (HOSPITAL_COMMUNITY): Payer: Medicare Other

## 2021-03-11 ENCOUNTER — Inpatient Hospital Stay (HOSPITAL_COMMUNITY)
Admission: EM | Admit: 2021-03-11 | Discharge: 2021-03-16 | DRG: 871 | Disposition: A | Discharge status: Home Health | Payer: Medicare Other | Attending: Internal Medicine | Admitting: Internal Medicine

## 2021-03-11 ENCOUNTER — Encounter (HOSPITAL_COMMUNITY): Payer: Self-pay | Admitting: Emergency Medicine

## 2021-03-11 DIAGNOSIS — E1122 Type 2 diabetes mellitus with diabetic chronic kidney disease: Secondary | ICD-10-CM | POA: Diagnosis present

## 2021-03-11 DIAGNOSIS — R509 Fever, unspecified: Secondary | ICD-10-CM

## 2021-03-11 DIAGNOSIS — Z8249 Family history of ischemic heart disease and other diseases of the circulatory system: Secondary | ICD-10-CM | POA: Diagnosis not present

## 2021-03-11 DIAGNOSIS — E1129 Type 2 diabetes mellitus with other diabetic kidney complication: Secondary | ICD-10-CM | POA: Diagnosis present

## 2021-03-11 DIAGNOSIS — E0821 Diabetes mellitus due to underlying condition with diabetic nephropathy: Secondary | ICD-10-CM

## 2021-03-11 DIAGNOSIS — Z7982 Long term (current) use of aspirin: Secondary | ICD-10-CM

## 2021-03-11 DIAGNOSIS — M159 Polyosteoarthritis, unspecified: Secondary | ICD-10-CM | POA: Diagnosis present

## 2021-03-11 DIAGNOSIS — I5043 Acute on chronic combined systolic (congestive) and diastolic (congestive) heart failure: Secondary | ICD-10-CM | POA: Diagnosis not present

## 2021-03-11 DIAGNOSIS — Z79899 Other long term (current) drug therapy: Secondary | ICD-10-CM | POA: Diagnosis not present

## 2021-03-11 DIAGNOSIS — M160 Bilateral primary osteoarthritis of hip: Secondary | ICD-10-CM | POA: Diagnosis not present

## 2021-03-11 DIAGNOSIS — R6510 Systemic inflammatory response syndrome (SIRS) of non-infectious origin without acute organ dysfunction: Secondary | ICD-10-CM | POA: Diagnosis present

## 2021-03-11 DIAGNOSIS — E860 Dehydration: Secondary | ICD-10-CM | POA: Diagnosis present

## 2021-03-11 DIAGNOSIS — N1832 Chronic kidney disease, stage 3b: Secondary | ICD-10-CM | POA: Diagnosis not present

## 2021-03-11 DIAGNOSIS — J441 Chronic obstructive pulmonary disease with (acute) exacerbation: Secondary | ICD-10-CM

## 2021-03-11 DIAGNOSIS — Z7984 Long term (current) use of oral hypoglycemic drugs: Secondary | ICD-10-CM | POA: Diagnosis not present

## 2021-03-11 DIAGNOSIS — Z7951 Long term (current) use of inhaled steroids: Secondary | ICD-10-CM | POA: Diagnosis not present

## 2021-03-11 DIAGNOSIS — I10 Essential (primary) hypertension: Secondary | ICD-10-CM | POA: Diagnosis present

## 2021-03-11 DIAGNOSIS — I255 Ischemic cardiomyopathy: Secondary | ICD-10-CM | POA: Diagnosis present

## 2021-03-11 DIAGNOSIS — I2510 Atherosclerotic heart disease of native coronary artery without angina pectoris: Secondary | ICD-10-CM | POA: Diagnosis present

## 2021-03-11 DIAGNOSIS — K219 Gastro-esophageal reflux disease without esophagitis: Secondary | ICD-10-CM | POA: Diagnosis not present

## 2021-03-11 DIAGNOSIS — E7800 Pure hypercholesterolemia, unspecified: Secondary | ICD-10-CM | POA: Diagnosis not present

## 2021-03-11 DIAGNOSIS — M109 Gout, unspecified: Secondary | ICD-10-CM | POA: Diagnosis not present

## 2021-03-11 DIAGNOSIS — J440 Chronic obstructive pulmonary disease with acute lower respiratory infection: Secondary | ICD-10-CM | POA: Diagnosis present

## 2021-03-11 DIAGNOSIS — R110 Nausea: Secondary | ICD-10-CM | POA: Diagnosis not present

## 2021-03-11 DIAGNOSIS — I878 Other specified disorders of veins: Secondary | ICD-10-CM | POA: Diagnosis not present

## 2021-03-11 DIAGNOSIS — I429 Cardiomyopathy, unspecified: Secondary | ICD-10-CM

## 2021-03-11 DIAGNOSIS — A409 Streptococcal sepsis, unspecified: Principal | ICD-10-CM | POA: Diagnosis present

## 2021-03-11 DIAGNOSIS — Z87891 Personal history of nicotine dependence: Secondary | ICD-10-CM | POA: Diagnosis not present

## 2021-03-11 DIAGNOSIS — I130 Hypertensive heart and chronic kidney disease with heart failure and stage 1 through stage 4 chronic kidney disease, or unspecified chronic kidney disease: Secondary | ICD-10-CM | POA: Diagnosis not present

## 2021-03-11 DIAGNOSIS — J154 Pneumonia due to other streptococci: Secondary | ICD-10-CM | POA: Diagnosis present

## 2021-03-11 DIAGNOSIS — I5031 Acute diastolic (congestive) heart failure: Secondary | ICD-10-CM | POA: Diagnosis not present

## 2021-03-11 DIAGNOSIS — R079 Chest pain, unspecified: Secondary | ICD-10-CM | POA: Diagnosis not present

## 2021-03-11 DIAGNOSIS — Z20822 Contact with and (suspected) exposure to covid-19: Secondary | ICD-10-CM | POA: Diagnosis present

## 2021-03-11 DIAGNOSIS — E785 Hyperlipidemia, unspecified: Secondary | ICD-10-CM | POA: Diagnosis present

## 2021-03-11 DIAGNOSIS — I252 Old myocardial infarction: Secondary | ICD-10-CM

## 2021-03-11 DIAGNOSIS — Z955 Presence of coronary angioplasty implant and graft: Secondary | ICD-10-CM

## 2021-03-11 DIAGNOSIS — J9811 Atelectasis: Secondary | ICD-10-CM | POA: Diagnosis not present

## 2021-03-11 DIAGNOSIS — R0789 Other chest pain: Secondary | ICD-10-CM | POA: Diagnosis not present

## 2021-03-11 DIAGNOSIS — R0602 Shortness of breath: Secondary | ICD-10-CM | POA: Diagnosis not present

## 2021-03-11 DIAGNOSIS — M4186 Other forms of scoliosis, lumbar region: Secondary | ICD-10-CM | POA: Diagnosis not present

## 2021-03-11 HISTORY — DX: Chronic obstructive pulmonary disease with (acute) exacerbation: J44.1

## 2021-03-11 LAB — CBC WITH DIFFERENTIAL/PLATELET
Abs Immature Granulocytes: 0.05 10*3/uL (ref 0.00–0.07)
Basophils Absolute: 0 10*3/uL (ref 0.0–0.1)
Basophils Relative: 0 %
Eosinophils Absolute: 0.2 10*3/uL (ref 0.0–0.5)
Eosinophils Relative: 2 %
HCT: 36.9 % — ABNORMAL LOW (ref 39.0–52.0)
Hemoglobin: 11.6 g/dL — ABNORMAL LOW (ref 13.0–17.0)
Immature Granulocytes: 1 %
Lymphocytes Relative: 15 %
Lymphs Abs: 1.6 10*3/uL (ref 0.7–4.0)
MCH: 27.2 pg (ref 26.0–34.0)
MCHC: 31.4 g/dL (ref 30.0–36.0)
MCV: 86.6 fL (ref 80.0–100.0)
Monocytes Absolute: 0.6 10*3/uL (ref 0.1–1.0)
Monocytes Relative: 5 %
Neutro Abs: 8.3 10*3/uL — ABNORMAL HIGH (ref 1.7–7.7)
Neutrophils Relative %: 77 %
Platelets: 287 10*3/uL (ref 150–400)
RBC: 4.26 MIL/uL (ref 4.22–5.81)
RDW: 14.8 % (ref 11.5–15.5)
WBC: 10.8 10*3/uL — ABNORMAL HIGH (ref 4.0–10.5)
nRBC: 0 % (ref 0.0–0.2)

## 2021-03-11 LAB — BASIC METABOLIC PANEL
Anion gap: 10 (ref 5–15)
BUN: 34 mg/dL — ABNORMAL HIGH (ref 8–23)
CO2: 21 mmol/L — ABNORMAL LOW (ref 22–32)
Calcium: 9.1 mg/dL (ref 8.9–10.3)
Chloride: 105 mmol/L (ref 98–111)
Creatinine, Ser: 1.68 mg/dL — ABNORMAL HIGH (ref 0.61–1.24)
GFR, Estimated: 42 mL/min — ABNORMAL LOW (ref >60)
Glucose, Bld: 133 mg/dL — ABNORMAL HIGH (ref 70–99)
Potassium: 4.7 mmol/L (ref 3.5–5.1)
Sodium: 136 mmol/L (ref 135–145)

## 2021-03-11 LAB — RESP PANEL BY RT-PCR (FLU A&B, COVID) ARPGX2
Influenza A by PCR: NEGATIVE
Influenza B by PCR: NEGATIVE
SARS Coronavirus 2 by RT PCR: NEGATIVE

## 2021-03-11 LAB — LACTIC ACID, PLASMA: Lactic Acid, Venous: 2.4 mmol/L (ref 0.5–1.9)

## 2021-03-11 MED ORDER — SODIUM CHLORIDE 0.9 % IV SOLN
1.0000 g | Freq: Once | INTRAVENOUS | Status: AC
  Administered 2021-03-11: 1 g via INTRAVENOUS
  Filled 2021-03-11: qty 10

## 2021-03-11 MED ORDER — ACETAMINOPHEN 325 MG PO TABS
650.0000 mg | ORAL_TABLET | Freq: Once | ORAL | Status: AC
  Administered 2021-03-11: 650 mg via ORAL
  Filled 2021-03-11: qty 2

## 2021-03-11 MED ORDER — METHYLPREDNISOLONE SODIUM SUCC 125 MG IJ SOLR
125.0000 mg | Freq: Once | INTRAMUSCULAR | Status: AC
  Administered 2021-03-11: 125 mg via INTRAVENOUS
  Filled 2021-03-11: qty 2

## 2021-03-11 MED ORDER — SENNOSIDES-DOCUSATE SODIUM 8.6-50 MG PO TABS: 1.0000 | ORAL_TABLET | Freq: Every evening | ORAL | Status: DC | PRN

## 2021-03-11 MED ORDER — PREDNISONE 20 MG PO TABS: 40.0000 mg | ORAL_TABLET | Freq: Every day | ORAL | Status: DC

## 2021-03-11 MED ORDER — SODIUM CHLORIDE 0.9 % IV SOLN
2.0000 g | INTRAVENOUS | Status: DC
  Administered 2021-03-12 – 2021-03-15 (×4): 2 g via INTRAVENOUS
  Filled 2021-03-11 (×4): qty 20

## 2021-03-11 MED ORDER — INSULIN ASPART 100 UNIT/ML IJ SOLN
0.0000 [IU] | Freq: Three times a day (TID) | INTRAMUSCULAR | Status: DC
  Administered 2021-03-12: 5 [IU] via SUBCUTANEOUS
  Administered 2021-03-12: 7 [IU] via SUBCUTANEOUS
  Administered 2021-03-12: 2 [IU] via SUBCUTANEOUS
  Administered 2021-03-13: 3 [IU] via SUBCUTANEOUS
  Administered 2021-03-13 (×2): 2 [IU] via SUBCUTANEOUS
  Administered 2021-03-14: 3 [IU] via SUBCUTANEOUS
  Administered 2021-03-14: 2 [IU] via SUBCUTANEOUS
  Administered 2021-03-14: 5 [IU] via SUBCUTANEOUS
  Administered 2021-03-15: 3 [IU] via SUBCUTANEOUS
  Administered 2021-03-15: 2 [IU] via SUBCUTANEOUS
  Administered 2021-03-15: 1 [IU] via SUBCUTANEOUS
  Administered 2021-03-16: 2 [IU] via SUBCUTANEOUS
  Administered 2021-03-16: 1 [IU] via SUBCUTANEOUS

## 2021-03-11 MED ORDER — ACETAMINOPHEN 325 MG PO TABS
650.0000 mg | ORAL_TABLET | Freq: Four times a day (QID) | ORAL | Status: DC | PRN
  Administered 2021-03-14: 650 mg via ORAL
  Filled 2021-03-11: qty 2

## 2021-03-11 MED ORDER — AZITHROMYCIN 250 MG PO TABS
500.0000 mg | ORAL_TABLET | Freq: Every day | ORAL | Status: DC
  Administered 2021-03-11: 500 mg via ORAL
  Filled 2021-03-11: qty 2

## 2021-03-11 MED ORDER — ONDANSETRON HCL 4 MG PO TABS: 4.0000 mg | ORAL_TABLET | Freq: Four times a day (QID) | ORAL | Status: DC | PRN

## 2021-03-11 MED ORDER — ACETAMINOPHEN 650 MG RE SUPP: 650.0000 mg | Freq: Four times a day (QID) | RECTAL | Status: DC | PRN

## 2021-03-11 MED ORDER — SODIUM CHLORIDE 0.9 % IV BOLUS: 500.0000 mL | Freq: Once | INTRAVENOUS | Status: DC

## 2021-03-11 MED ORDER — ALBUTEROL SULFATE (2.5 MG/3ML) 0.083% IN NEBU: 2.5000 mg | INHALATION_SOLUTION | RESPIRATORY_TRACT | Status: DC | PRN

## 2021-03-11 MED ORDER — IPRATROPIUM-ALBUTEROL 0.5-2.5 (3) MG/3ML IN SOLN: 3.0000 mL | Freq: Four times a day (QID) | RESPIRATORY_TRACT | Status: DC

## 2021-03-11 MED ORDER — ENOXAPARIN SODIUM 40 MG/0.4ML IJ SOSY
40.0000 mg | PREFILLED_SYRINGE | Freq: Every day | INTRAMUSCULAR | Status: DC
  Administered 2021-03-12 – 2021-03-15 (×4): 40 mg via SUBCUTANEOUS
  Filled 2021-03-11 (×4): qty 0.4

## 2021-03-11 MED ORDER — ONDANSETRON HCL 4 MG/2ML IJ SOLN: 4.0000 mg | Freq: Four times a day (QID) | INTRAMUSCULAR | Status: DC | PRN

## 2021-03-11 MED ORDER — DOXYCYCLINE HYCLATE 100 MG PO TABS
100.0000 mg | ORAL_TABLET | Freq: Two times a day (BID) | ORAL | Status: DC
  Administered 2021-03-12 – 2021-03-16 (×9): 100 mg via ORAL
  Filled 2021-03-11 (×9): qty 1

## 2021-03-11 MED ORDER — ACETAMINOPHEN 325 MG PO TABS
650.0000 mg | ORAL_TABLET | Freq: Once | ORAL | Status: AC
  Administered 2021-03-12: 650 mg via ORAL
  Filled 2021-03-11: qty 2

## 2021-03-11 MED ORDER — METHYLPREDNISOLONE SODIUM SUCC 125 MG IJ SOLR
60.0000 mg | Freq: Four times a day (QID) | INTRAMUSCULAR | Status: DC
Start: 2021-03-12 — End: 2021-03-12
  Administered 2021-03-12: 60 mg via INTRAVENOUS
  Filled 2021-03-11: qty 2

## 2021-03-11 MED ORDER — ALBUTEROL SULFATE HFA 108 (90 BASE) MCG/ACT IN AERS
6.0000 | INHALATION_SPRAY | RESPIRATORY_TRACT | Status: AC
  Administered 2021-03-11 – 2021-03-12 (×2): 6 via RESPIRATORY_TRACT
  Filled 2021-03-11: qty 6.7

## 2021-03-11 NOTE — ED Triage Notes (Signed)
Patient reports worsening SOB with chest tightness , chest congestion/cough and runny nose , febrile at triage .
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
## 2021-03-11 NOTE — ED Provider Notes (Signed)
Emergency Medicine Provider Triage Evaluation Note  Luis Carter , a 75 y.o. male  was evaluated in triage.  Pt complains of shortness of breath and cough that started earlier today. History of COPD, states it feels similar to past exacerbations. Denies fever and chills; however, patient febrile in triage. No sick contacts or known COVID exposures. Denies chest pain.   Review of Systems  Positive: SOB, cough Negative: CP  Physical Exam  BP (!) 150/119 (BP Location: Right Arm)   Pulse (!) 111   Temp (!) 101.2 F (38.4 C)   Resp (!) 23   SpO2 96%  Gen:   Awake, no distress   Resp:  Normal effort, wheeze and rhonchi present MSK:   Moves extremities without difficulty  Other:    Medical Decision Making  Medically screening exam initiated at 9:11 PM.  Appropriate orders placed.  Luis Carter was informed that the remainder of the evaluation will be completed by another provider, this initial triage assessment does not replace that evaluation, and the importance of remaining in the ED until their evaluation is complete.  Concern for sepsis. Screening labs ordered. Charge RN, Shanda Bumps, made aware of need for bed.  COVID test   Luis Carter 03/11/21 2113    Blane Ohara, MD 03/11/21 2242
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
## 2021-03-12 ENCOUNTER — Inpatient Hospital Stay (HOSPITAL_COMMUNITY): Payer: Medicare Other

## 2021-03-12 DIAGNOSIS — I5031 Acute diastolic (congestive) heart failure: Secondary | ICD-10-CM

## 2021-03-12 LAB — RAPID URINE DRUG SCREEN, HOSP PERFORMED
Amphetamines: NOT DETECTED
Barbiturates: NOT DETECTED
Benzodiazepines: NOT DETECTED
Cocaine: NOT DETECTED
Opiates: NOT DETECTED
Tetrahydrocannabinol: NOT DETECTED

## 2021-03-12 LAB — GLUCOSE, CAPILLARY
Glucose-Capillary: 151 mg/dL — ABNORMAL HIGH (ref 70–99)
Glucose-Capillary: 256 mg/dL — ABNORMAL HIGH (ref 70–99)

## 2021-03-12 LAB — CBC
HCT: 39 % (ref 39.0–52.0)
Hemoglobin: 11.6 g/dL — ABNORMAL LOW (ref 13.0–17.0)
MCH: 26.9 pg (ref 26.0–34.0)
MCHC: 29.7 g/dL — ABNORMAL LOW (ref 30.0–36.0)
MCV: 90.5 fL (ref 80.0–100.0)
Platelets: 260 10*3/uL (ref 150–400)
RBC: 4.31 MIL/uL (ref 4.22–5.81)
RDW: 14.7 % (ref 11.5–15.5)
WBC: 16.7 10*3/uL — ABNORMAL HIGH (ref 4.0–10.5)
nRBC: 0 % (ref 0.0–0.2)

## 2021-03-12 LAB — URINALYSIS, ROUTINE W REFLEX MICROSCOPIC
Bilirubin Urine: NEGATIVE
Glucose, UA: NEGATIVE mg/dL
Ketones, ur: NEGATIVE mg/dL
Nitrite: NEGATIVE
Protein, ur: NEGATIVE mg/dL
Specific Gravity, Urine: 1.012 (ref 1.005–1.030)
WBC, UA: >50 WBC/hpf — ABNORMAL HIGH (ref 0–5)
pH: 5 (ref 5.0–8.0)

## 2021-03-12 LAB — PROCALCITONIN
Procalcitonin: 18.43 ng/mL
Procalcitonin: 31.99 ng/mL

## 2021-03-12 LAB — BASIC METABOLIC PANEL
Anion gap: 12 (ref 5–15)
BUN: 33 mg/dL — ABNORMAL HIGH (ref 8–23)
CO2: 17 mmol/L — ABNORMAL LOW (ref 22–32)
Calcium: 9.1 mg/dL (ref 8.9–10.3)
Chloride: 106 mmol/L (ref 98–111)
Creatinine, Ser: 1.74 mg/dL — ABNORMAL HIGH (ref 0.61–1.24)
GFR, Estimated: 40 mL/min — ABNORMAL LOW (ref >60)
Glucose, Bld: 210 mg/dL — ABNORMAL HIGH (ref 70–99)
Potassium: 5 mmol/L (ref 3.5–5.1)
Sodium: 135 mmol/L (ref 135–145)

## 2021-03-12 LAB — BRAIN NATRIURETIC PEPTIDE
B Natriuretic Peptide: 84.5 pg/mL (ref 0.0–100.0)
B Natriuretic Peptide: 165.8 pg/mL — ABNORMAL HIGH (ref 0.0–100.0)

## 2021-03-12 LAB — ECHOCARDIOGRAM COMPLETE
Area-P 1/2: 2.16 cm2
S' Lateral: 3.5 cm

## 2021-03-12 LAB — LACTIC ACID, PLASMA: Lactic Acid, Venous: 2.3 mmol/L (ref 0.5–1.9)

## 2021-03-12 LAB — TROPONIN I (HIGH SENSITIVITY): Troponin I (High Sensitivity): 27 ng/L — ABNORMAL HIGH (ref <18)

## 2021-03-12 LAB — HEMOGLOBIN A1C
Hgb A1c MFr Bld: 6.6 % — ABNORMAL HIGH (ref 4.8–5.6)
Mean Plasma Glucose: 142.72 mg/dL

## 2021-03-12 LAB — STREP PNEUMONIAE URINARY ANTIGEN: Strep Pneumo Urinary Antigen: POSITIVE — AB

## 2021-03-12 LAB — MAGNESIUM: Magnesium: 1.7 mg/dL (ref 1.7–2.4)

## 2021-03-12 LAB — CBG MONITORING, ED
Glucose-Capillary: 256 mg/dL — ABNORMAL HIGH (ref 70–99)
Glucose-Capillary: 343 mg/dL — ABNORMAL HIGH (ref 70–99)

## 2021-03-12 MED ORDER — ALBUTEROL SULFATE (2.5 MG/3ML) 0.083% IN NEBU
2.5000 mg | INHALATION_SOLUTION | RESPIRATORY_TRACT | Status: DC | PRN
  Administered 2021-03-16: 2.5 mg via RESPIRATORY_TRACT
  Filled 2021-03-12: qty 3

## 2021-03-12 MED ORDER — GABAPENTIN 300 MG PO CAPS
300.0000 mg | ORAL_CAPSULE | Freq: Three times a day (TID) | ORAL | Status: DC
  Administered 2021-03-12 – 2021-03-16 (×13): 300 mg via ORAL
  Filled 2021-03-12 (×6): qty 1
  Filled 2021-03-12: qty 3
  Filled 2021-03-12 (×6): qty 1

## 2021-03-12 MED ORDER — ASPIRIN EC 81 MG PO TBEC
81.0000 mg | DELAYED_RELEASE_TABLET | Freq: Every day | ORAL | Status: DC
  Administered 2021-03-12 – 2021-03-16 (×5): 81 mg via ORAL
  Filled 2021-03-12 (×5): qty 1

## 2021-03-12 MED ORDER — INSULIN ASPART 100 UNIT/ML IJ SOLN
0.0000 [IU] | Freq: Every day | INTRAMUSCULAR | Status: DC
  Administered 2021-03-12: 3 [IU] via SUBCUTANEOUS
  Administered 2021-03-13: 4 [IU] via SUBCUTANEOUS
  Administered 2021-03-15: 2 [IU] via SUBCUTANEOUS

## 2021-03-12 MED ORDER — IPRATROPIUM-ALBUTEROL 0.5-2.5 (3) MG/3ML IN SOLN
3.0000 mL | Freq: Three times a day (TID) | RESPIRATORY_TRACT | Status: DC
  Administered 2021-03-12 – 2021-03-13 (×4): 3 mL via RESPIRATORY_TRACT
  Filled 2021-03-12 (×4): qty 3

## 2021-03-12 MED ORDER — ATORVASTATIN CALCIUM 10 MG PO TABS
20.0000 mg | ORAL_TABLET | Freq: Every day | ORAL | Status: DC
  Administered 2021-03-12 – 2021-03-16 (×5): 20 mg via ORAL
  Filled 2021-03-12 (×6): qty 2

## 2021-03-12 MED ORDER — SODIUM POLYSTYRENE SULFONATE 15 GM/60ML PO SUSP
15.0000 g | Freq: Once | ORAL | Status: AC
  Administered 2021-03-12: 15 g via ORAL
  Filled 2021-03-12: qty 60

## 2021-03-12 MED ORDER — ALLOPURINOL 100 MG PO TABS
200.0000 mg | ORAL_TABLET | Freq: Every day | ORAL | Status: DC
  Administered 2021-03-12 – 2021-03-16 (×5): 200 mg via ORAL
  Filled 2021-03-12 (×5): qty 2

## 2021-03-12 MED ORDER — MONTELUKAST SODIUM 10 MG PO TABS
10.0000 mg | ORAL_TABLET | Freq: Every day | ORAL | Status: DC
  Administered 2021-03-12 – 2021-03-15 (×4): 10 mg via ORAL
  Filled 2021-03-12 (×4): qty 1

## 2021-03-12 MED ORDER — AMLODIPINE BESYLATE 5 MG PO TABS
5.0000 mg | ORAL_TABLET | Freq: Every day | ORAL | Status: DC
  Administered 2021-03-12 – 2021-03-16 (×5): 5 mg via ORAL
  Filled 2021-03-12 (×5): qty 1

## 2021-03-12 MED ORDER — METHYLPREDNISOLONE SODIUM SUCC 125 MG IJ SOLR
60.0000 mg | Freq: Two times a day (BID) | INTRAMUSCULAR | Status: DC
  Administered 2021-03-12 – 2021-03-13 (×2): 60 mg via INTRAVENOUS
  Filled 2021-03-12 (×2): qty 2

## 2021-03-12 MED ORDER — LACTATED RINGERS IV SOLN: INTRAVENOUS | Status: AC

## 2021-03-12 NOTE — Progress Notes (Signed)
Echocardiogram 2D Echocardiogram has been performed.  Warren Lacy Dagny Fiorentino RDCS 03/12/2021, 11:37 AM

## 2021-03-12 NOTE — Evaluation (Signed)
Physical Therapy Evaluation Patient Details Name: Luis Carter MRN: [id number] DOB: 1946-07-01 Today's Date: 03/12/2021   History of Present Illness  Pt is a 75 y/o male admitted 7/18 secondary to cheset tightness and SOB. Thought to be secondary to COPD exacerbation. PMH includes COPD, DM, cardiomyopathy, substance abuse, asthma, CAD s/p stent, and CKD.  Clinical Impression  Pt admitted secondary to problem above with deficits below. Pt requiring supervision for bed mobility and min to min guard A for transfers and gait at EOB within ED room. Pt with notable functional weakness in BLE, but reports this is baseline. Educated about using RW at home at all times to increase stability. Feel he would benefit from HHPT at d/c for increased independence and safety with mobility. Will continue to follow acutely.     Follow Up Recommendations Home health PT;Supervision for mobility/OOB    Equipment Recommendations  None recommended by PT    Recommendations for Other Services       Precautions / Restrictions Precautions Precautions: Fall Restrictions Weight Bearing Restrictions: No      Mobility  Bed Mobility Overal bed mobility: Needs Assistance Bed Mobility: Supine to Sit;Sit to Supine     Supine to sit: Supervision Sit to supine: Supervision   General bed mobility comments: supervision for safety    Transfers Overall transfer level: Needs assistance Equipment used: 1 person hand held assist Transfers: Sit to/from Stand Sit to Stand: Min guard         General transfer comment: Min guard A for safety. Increased time required.  Ambulation/Gait Ambulation/Gait assistance: Min assist;+2 safety/equipment;+2 physical assistance   Assistive device: 1 person hand held assist;2 person hand held assist Gait Pattern/deviations: Step-through pattern;Decreased stride length Gait velocity: Decreased   General Gait Details: Noted increased functional weakness bilaterally. Mild  buckling noted in bilateral knees. Min A for steadying. Educated about using RW for mobility at home.  Stairs            Wheelchair Mobility    Modified Rankin (Stroke Patients Only)       Balance Overall balance assessment: Needs assistance Sitting-balance support: No upper extremity supported;Feet supported Sitting balance-Leahy Scale: Good     Standing balance support: Single extremity supported;Bilateral upper extremity supported Standing balance-Leahy Scale: Poor Standing balance comment: Reliant on at least 1 UE support                             Pertinent Vitals/Pain Pain Assessment: Faces Faces Pain Scale: Hurts a little bit Pain Location: bilateral knees Pain Descriptors / Indicators: Aching;Grimacing;Guarding Pain Intervention(s): Limited activity within patient's tolerance;Monitored during session;Repositioned    Home Living Family/patient expects to be discharged to:: Private residence Living Arrangements: Other relatives (sister) Available Help at Discharge: Available 24 hours/day;Family Type of Home: House Home Access: Stairs to enter Entrance Stairs-Rails: None Entrance Stairs-Number of Steps: 2 Home Layout: Multi-level;Able to live on main level with bedroom/bathroom Home Equipment: Dan Humphreys - 2 wheels      Prior Function Level of Independence: Independent with assistive device(s)         Comments: Reports occasional use of RW     Hand Dominance        Extremity/Trunk Assessment   Upper Extremity Assessment Upper Extremity Assessment: Defer to OT evaluation    Lower Extremity Assessment Lower Extremity Assessment: Generalized weakness;LLE deficits/detail;RLE deficits/detail RLE Deficits / Details: Grossly 4+/5 throughout LLE Deficits / Details: Grossly 4/5 throughout  Cervical / Trunk Assessment Cervical / Trunk Assessment: Normal  Communication   Communication: No difficulties  Cognition Arousal/Alertness:  Awake/alert Behavior During Therapy: WFL for tasks assessed/performed Overall Cognitive Status: No family/caregiver present to determine baseline cognitive functioning                                        General Comments General comments (skin integrity, edema, etc.): Oxygen sats ranging from 92-100% on RA during mobility tasks    Exercises     Assessment/Plan    PT Assessment Patient needs continued PT services  PT Problem List Decreased strength;Decreased range of motion;Decreased balance;Decreased activity tolerance;Decreased mobility;Decreased knowledge of use of DME;Decreased knowledge of precautions       PT Treatment Interventions DME instruction;Gait training;Stair training;Functional mobility training;Therapeutic activities;Therapeutic exercise;Balance training;Patient/family education    PT Goals (Current goals can be found in the Care Plan section)  Acute Rehab PT Goals Patient Stated Goal: to go home PT Goal Formulation: With patient Time For Goal Achievement: 03/26/21 Potential to Achieve Goals: Good    Frequency Min 3X/week   Barriers to discharge        Co-evaluation               AM-PAC PT "6 Clicks" Mobility  Outcome Measure Help needed turning from your back to your side while in a flat bed without using bedrails?: None Help needed moving from lying on your back to sitting on the side of a flat bed without using bedrails?: None Help needed moving to and from a bed to a chair (including a wheelchair)?: A Little Help needed standing up from a chair using your arms (e.g., wheelchair or bedside chair)?: A Little Help needed to walk in hospital room?: A Little Help needed climbing 3-5 steps with a railing? : A Lot 6 Click Score: 19    End of Session   Activity Tolerance: Patient limited by fatigue Patient left: in bed;with call bell/phone within reach (on stretcher in ED) Nurse Communication: Mobility status PT Visit Diagnosis:  Unsteadiness on feet (R26.81);Repeated falls (R29.6)    Time: 3244-0102 PT Time Calculation (min) (ACUTE ONLY): 13 min   Charges:   PT Evaluation $PT Eval Low Complexity: 1 Low          Cindee Salt, DPT  Acute Rehabilitation Services  Pager: [phone] Office: [phone]   Lehman Prom 03/12/2021, 1:36 PM

## 2021-03-12 NOTE — ED Notes (Signed)
[phone] Sister called wanting an update
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
## 2021-03-13 ENCOUNTER — Inpatient Hospital Stay (HOSPITAL_COMMUNITY): Payer: Medicare Other

## 2021-03-13 LAB — COMPREHENSIVE METABOLIC PANEL
ALT: 12 U/L (ref 0–44)
AST: 20 U/L (ref 15–41)
Albumin: 3.1 g/dL — ABNORMAL LOW (ref 3.5–5.0)
Alkaline Phosphatase: 66 U/L (ref 38–126)
Anion gap: 9 (ref 5–15)
BUN: 39 mg/dL — ABNORMAL HIGH (ref 8–23)
CO2: 22 mmol/L (ref 22–32)
Calcium: 8.9 mg/dL (ref 8.9–10.3)
Chloride: 104 mmol/L (ref 98–111)
Creatinine, Ser: 1.55 mg/dL — ABNORMAL HIGH (ref 0.61–1.24)
GFR, Estimated: 46 mL/min — ABNORMAL LOW (ref >60)
Glucose, Bld: 171 mg/dL — ABNORMAL HIGH (ref 70–99)
Potassium: 4.4 mmol/L (ref 3.5–5.1)
Sodium: 135 mmol/L (ref 135–145)
Total Bilirubin: 0.5 mg/dL (ref 0.3–1.2)
Total Protein: 6.7 g/dL (ref 6.5–8.1)

## 2021-03-13 LAB — CBC WITH DIFFERENTIAL/PLATELET
Abs Immature Granulocytes: 0.16 10*3/uL — ABNORMAL HIGH (ref 0.00–0.07)
Basophils Absolute: 0 10*3/uL (ref 0.0–0.1)
Basophils Relative: 0 %
Eosinophils Absolute: 0 10*3/uL (ref 0.0–0.5)
Eosinophils Relative: 0 %
HCT: 32.7 % — ABNORMAL LOW (ref 39.0–52.0)
Hemoglobin: 10.4 g/dL — ABNORMAL LOW (ref 13.0–17.0)
Immature Granulocytes: 1 %
Lymphocytes Relative: 9 %
Lymphs Abs: 1.8 10*3/uL (ref 0.7–4.0)
MCH: 27 pg (ref 26.0–34.0)
MCHC: 31.8 g/dL (ref 30.0–36.0)
MCV: 84.9 fL (ref 80.0–100.0)
Monocytes Absolute: 1 10*3/uL (ref 0.1–1.0)
Monocytes Relative: 5 %
Neutro Abs: 15.8 10*3/uL — ABNORMAL HIGH (ref 1.7–7.7)
Neutrophils Relative %: 85 %
Platelets: 286 10*3/uL (ref 150–400)
RBC: 3.85 MIL/uL — ABNORMAL LOW (ref 4.22–5.81)
RDW: 14.9 % (ref 11.5–15.5)
WBC: 18.7 10*3/uL — ABNORMAL HIGH (ref 4.0–10.5)
nRBC: 0 % (ref 0.0–0.2)

## 2021-03-13 LAB — GLUCOSE, CAPILLARY
Glucose-Capillary: 174 mg/dL — ABNORMAL HIGH (ref 70–99)
Glucose-Capillary: 188 mg/dL — ABNORMAL HIGH (ref 70–99)
Glucose-Capillary: 224 mg/dL — ABNORMAL HIGH (ref 70–99)
Glucose-Capillary: 317 mg/dL — ABNORMAL HIGH (ref 70–99)

## 2021-03-13 LAB — LEGIONELLA PNEUMOPHILA SEROGP 1 UR AG: L. pneumophila Serogp 1 Ur Ag: NEGATIVE

## 2021-03-13 LAB — C-REACTIVE PROTEIN: CRP: 8.9 mg/dL — ABNORMAL HIGH (ref <1.0)

## 2021-03-13 LAB — PROCALCITONIN: Procalcitonin: 34.74 ng/mL

## 2021-03-13 LAB — MAGNESIUM: Magnesium: 1.6 mg/dL — ABNORMAL LOW (ref 1.7–2.4)

## 2021-03-13 LAB — BRAIN NATRIURETIC PEPTIDE: B Natriuretic Peptide: 160.3 pg/mL — ABNORMAL HIGH (ref 0.0–100.0)

## 2021-03-13 MED ORDER — IPRATROPIUM-ALBUTEROL 0.5-2.5 (3) MG/3ML IN SOLN
3.0000 mL | Freq: Two times a day (BID) | RESPIRATORY_TRACT | Status: DC
  Administered 2021-03-13 – 2021-03-15 (×4): 3 mL via RESPIRATORY_TRACT
  Filled 2021-03-13 (×4): qty 3

## 2021-03-13 MED ORDER — POLYETHYLENE GLYCOL 3350 17 G PO PACK
17.0000 g | PACK | Freq: Two times a day (BID) | ORAL | Status: DC
  Administered 2021-03-13 – 2021-03-16 (×5): 17 g via ORAL
  Filled 2021-03-13 (×6): qty 1

## 2021-03-13 MED ORDER — METHYLPREDNISOLONE SODIUM SUCC 40 MG IJ SOLR
40.0000 mg | Freq: Two times a day (BID) | INTRAMUSCULAR | Status: DC
  Administered 2021-03-13 – 2021-03-14 (×2): 40 mg via INTRAVENOUS
  Filled 2021-03-13 (×2): qty 1

## 2021-03-13 MED ORDER — DOCUSATE SODIUM 100 MG PO CAPS
100.0000 mg | ORAL_CAPSULE | Freq: Two times a day (BID) | ORAL | Status: DC
  Administered 2021-03-13 – 2021-03-16 (×7): 100 mg via ORAL
  Filled 2021-03-13 (×7): qty 1

## 2021-03-13 MED ORDER — MAGNESIUM SULFATE 2 GM/50ML IV SOLN
2.0000 g | Freq: Once | INTRAVENOUS | Status: AC
  Administered 2021-03-13: 2 g via INTRAVENOUS
  Filled 2021-03-13: qty 50

## 2021-03-13 NOTE — Therapy (Signed)
Occupational Therapy Evaluation Patient Details Name: Luis Carter MRN: [id number] DOB: 11-19-1945 Today's Date: 03/13/2021    History of Present Illness Pt is a 75 y/o male admitted 7/18 secondary to cheset tightness and SOB. Thought to be secondary to COPD exacerbation. PMH includes COPD, DM, cardiomyopathy, substance abuse, asthma, CAD s/p stent, and CKD.   Clinical Impression   Pt was admitted as above and is currently requiring Min A overall with ADL's, self care and functional mobility/transfers. Pt benefits from use of RW for increased stability during functional mobility in room during ADL tasks. He should benefit from acute OT to assist w/ maximizing independence w/ functional mobility/transfers in preparation for d/c home with intermittent assist from sister PRN. Consider tub transfer/pt education next visit.    Follow Up Recommendations  Home health OT;Supervision - Intermittent    Equipment Recommendations  Tub/shower seat       Precautions / Restrictions Precautions Precautions: Fall Restrictions Weight Bearing Restrictions: No      Mobility Bed Mobility Overal bed mobility: Needs Assistance Bed Mobility: Supine to Sit     Supine to sit: Supervision;HOB elevated     General bed mobility comments: supervision for safety    Transfers Overall transfer level: Needs assistance Equipment used: Rolling walker (2 wheeled) Transfers: Sit to/from [employer] Sit to Stand: Min guard Stand pivot transfers: Min guard       General transfer comment: Min guard A for safety. Increased time required.    Balance Overall balance assessment: Needs assistance Sitting-balance support: No upper extremity supported;Feet supported Sitting balance-Leahy Scale: Good     Standing balance support: No upper extremity supported;During functional activity Standing balance-Leahy Scale: Poor Standing balance comment: Reliant on at least 1 UE support. Pt noted  to lean forward at trunk when performing sink level ADL's.              ADL either performed or assessed with clinical judgement   ADL Overall ADL's : Needs assistance/impaired     Grooming: Wash/dry face;Wash/dry hands;Oral care;Set up;Min guard;Standing   Upper Body Bathing: Set up;Sitting   Lower Body Bathing: Sitting/lateral leans;Sit to/from stand;Minimal assistance   Upper Body Dressing : Set up;Sitting   Lower Body Dressing: Sitting/lateral leans;Minimal assistance;Set up   Toilet Transfer: Min guard;Ambulation;Regular Toilet;Grab bars;RW   Toileting- Clothing Manipulation and Hygiene: Supervision/safety;Min guard;Sit to/from stand   Tub/ Shower Transfer: Rolling walker;Min guard;Supervision/safety;Ambulation;Shower seat;Tub bench   Functional mobility during ADLs: Min guard;Rolling walker;Cueing for safety;Cueing for sequencing General ADL Comments: Pt is overall supervision- Min A ADL's and functional mobility during transfers using RW. Ocassional VC's for safety/sequencing. Pt is used to being independent.                  Pertinent Vitals/Pain Faces Pain Scale: No hurt     Hand Dominance  Right            Cervical / Trunk Assessment Cervical / Trunk Assessment: Normal   Communication Communication Communication: No difficulties   Cognition Arousal/Alertness: Awake/alert Behavior During Therapy: WFL for tasks assessed/performed Overall Cognitive Status: No family/caregiver present to determine baseline cognitive functioning                                     General Comments                  Home Living Family/patient expects to be discharged  to:: Private residence Living Arrangements: Other relatives (sister) Available Help at Discharge: Available 24 hours/day;Family Type of Home: House Home Access: Stairs to enter [employer] of Steps: 2 Entrance Stairs-Rails: None Home Layout: Multi-level;Able to live  on main level with bedroom/bathroom     Bathroom Shower/Tub: Tub/shower unit;Curtain   Bathroom Toilet: Standard     Home Equipment: Environmental consultant - 2 wheels          Prior Functioning/Environment Level of Independence: Independent with assistive device(s)        Comments: Reports occasional use of RW        OT Problem List: Decreased activity tolerance;Impaired balance (sitting and/or standing);Decreased knowledge of use of DME or AE;Other (comment) (Decreased endurance)      OT Treatment/Interventions: Self-care/ADL training;Energy conservation;DME and/or AE instruction;Therapeutic activities;Patient/family education    OT Goals(Current goals can be found in the care plan section) Acute Rehab OT Goals Patient Stated Goal: Go home OT Goal Formulation: With patient Time For Goal Achievement: 03/27/21 Potential to Achieve Goals: Good ADL Goals Pt Will Perform Grooming: with modified independence;sitting;standing Pt Will Perform Lower Body Dressing: with set-up;with modified independence;sitting/lateral leans;sit to/from stand Pt Will Transfer to Toilet: with modified independence;ambulating;regular height toilet Pt Will Perform Toileting - Clothing Manipulation and hygiene: with modified independence;sitting/lateral leans;sit to/from stand Pt Will Perform Tub/Shower Transfer: Tub transfer;with supervision;with modified independence;rolling walker;ambulating;shower seat;tub bench  OT Frequency: Min 2X/week    AM-PAC OT "6 Clicks" Daily Activity     Outcome Measure Help from another person eating meals?: None Help from another person taking care of personal grooming?: A Little Help from another person toileting, which includes using toliet, bedpan, or urinal?: A Little Help from another person bathing (including washing, rinsing, drying)?: A Little Help from another person to put on and taking off regular upper body clothing?: None Help from another person to put on and taking  off regular lower body clothing?: A Little 6 Click Score: 20   End of Session Equipment Utilized During Treatment: Gait belt;Rolling walker Nurse Communication: Mobility status;Other (comment) (Pt sitting up in chair)  Activity Tolerance: Patient tolerated treatment well Patient left: in chair;with chair alarm set;with call bell/phone within reach  OT Visit Diagnosis: Muscle weakness (generalized) (M62.81);Unsteadiness on feet (R26.81)                Time: 08:24 - 08:55 OT Time Calculation (min): 31 min Charges:  1 OT eval low   1 self care/home management  Dairon Procter Beth Dixon, OTR/L 03/13/2021, 12:19 PM

## 2021-03-13 NOTE — Consult Note (Signed)
   [facility] Inpatient Consult   03/13/2021  Luis Carter 09/25/1945 [id number]  [name]  Accountable Care Organization [ACO] Patient: [employer]  Primary Care Provider: Nolene Ebbs, MD, is [facility] and is NOT a [facility] affiliated provider.  Patient screened for hospitalization with noted high risk score for unplanned readmission risk and  to assess for potential [facility] service needs for post hospital transition.    Met with patient at the bedside and confirms he goes to [facility] then patient called his sister [name] and handed this Probation officer his phone.  Explained to sister regarding his primary care and insurance card to be updated to his current primary care. Sister states she did not understand it all. Insurance card showing a different provider.  Plan: Patient to update information as he states he's "been with [his] primary doctor since almost 5 years I think." Not currently in [facility] plan.  For questions contact:   Natividad Brood, RN BSN [facility] Liaison  [phone] business mobile phone Toll free office [phone]  Fax number: [phone] [email or web address]

## 2021-03-13 NOTE — Progress Notes (Signed)
Physical Therapy Treatment Patient Details Name: Luis Carter MRN: [id number] DOB: [birth year] Today's Date: 03/13/2021    History of Present Illness Pt is a 75 y.o. male admitted 03/11/21 with chest tightness and SOB. Workup for COPD exacerbation, community-acquired strep PNA. PMH includes COPD, DM, cardiomyopathy, asthma, CAD, CKD, substance abuse.   PT Comments    Pt progressing well with mobility. Tolerated gait training with RW and SPC, intermittent min guard for balance. Pt demonstrates improving activity tolerance. Remains limited by generalized weakness, fatigue, impaired balance strategies/postural reactions. Continue to recommend HHPT services. Will follow acutely to address established goals.    Follow Up Recommendations  Home health PT;Supervision for mobility/OOB     Equipment Recommendations  None recommended by PT    Recommendations for Other Services       Precautions / Restrictions Precautions Precautions: Fall Restrictions Weight Bearing Restrictions: No    Mobility  Bed Mobility Overal bed mobility: Needs Assistance Bed Mobility: Supine to Sit     Supine to sit: Supervision;HOB elevated     General bed mobility comments: Received sitting in recliner    Transfers Overall transfer level: Needs assistance Equipment used: Rolling walker (2 wheeled);Straight cane Transfers: Sit to/from Stand Sit to Stand: Supervision Stand pivot transfers: Min guard       General transfer comment: Multiple sit<>stands from recliner with RW and SPC, supervision for safety  Ambulation/Gait Ambulation/Gait assistance: Min guard Gait Distance (Feet): 500 Feet (+ 500') Assistive device: Rolling walker (2 wheeled);Straight cane Gait Pattern/deviations: Step-through pattern;Decreased stride length;Trunk flexed Gait velocity: dECREASED   General Gait Details: Slow, mildly unsteady gait with intermittent min guard for balance, progressing to supervision for safety; pt  ambulating 500' with RW, seated rest break, then ambulating additional 500' with SPC; pt endorses increased BLE fatigue towards end of second gait trial, starting to reach to hand rail for UE support; no overt LOB   Stairs Stairs:  (pt declined stair training; discussed safe technique recommendations with SPC and sister guarding)           Wheelchair Mobility    Modified Rankin (Stroke Patients Only)       Balance Overall balance assessment: Needs assistance Sitting-balance support: No upper extremity supported;Feet supported Sitting balance-Leahy Scale: Good     Standing balance support: No upper extremity supported;During functional activity;Single extremity supported Standing balance-Leahy Scale: Fair Standing balance comment: Can static stand and take steps without UE support; stability improved with at least single UE support                            Cognition Arousal/Alertness: Awake/alert Behavior During Therapy: WFL for tasks assessed/performed Overall Cognitive Status: No family/caregiver present to determine baseline cognitive functioning                                 General Comments: WFL for simple tasks, following majority of simple commands appropriately, not formally assessed; suspect at baseline cognition      Exercises      General Comments        Pertinent Vitals/Pain Pain Assessment: Faces Faces Pain Scale: Hurts a little bit Pain Location: BLEs Pain Descriptors / Indicators: Sore Pain Intervention(s): Monitored during session (pt reports decreased soreness with ambulation)    Home Living Family/patient expects to be discharged to:: Private residence Living Arrangements: Other relatives (sister) Available Help at Discharge: Available 24  hours/day;Family Type of Home: House Home Access: Stairs to enter Entrance Stairs-Rails: None Home Layout: Multi-level;Able to live on main level with bedroom/bathroom Home  Equipment: Walker - 2 wheels      Prior Function Level of Independence: Independent with assistive device(s)      Comments: Reports occasional use of RW   PT Goals (current goals can now be found in the care plan section) Acute Rehab PT Goals Patient Stated Goal: Go home Progress towards PT goals: Progressing toward goals    Frequency    Min 3X/week      PT Plan Current plan remains appropriate    Co-evaluation              AM-PAC PT "6 Clicks" Mobility   Outcome Measure  Help needed turning from your back to your side while in a flat bed without using bedrails?: None Help needed moving from lying on your back to sitting on the side of a flat bed without using bedrails?: None Help needed moving to and from a bed to a chair (including a wheelchair)?: A Little Help needed standing up from a chair using your arms (e.g., wheelchair or bedside chair)?: A Little Help needed to walk in hospital room?: A Little Help needed climbing 3-5 steps with a railing? : A Lot 6 Click Score: 19    End of Session Equipment Utilized During Treatment: Gait belt Activity Tolerance: Patient tolerated treatment well Patient left: in chair;with call bell/phone within reach;with chair alarm set Nurse Communication: Mobility status PT Visit Diagnosis: Unsteadiness on feet (R26.81);Repeated falls (R29.6)     Time: 6378-5885 PT Time Calculation (min) (ACUTE ONLY): 20 min  Charges:  $Gait Training: 8-22 mins                     Ina Homes, PT, DPT Acute Rehabilitation Services  Pager [phone] Office [phone]  Malachy Chamber 03/13/2021, 2:58 PM
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
## 2021-03-14 LAB — CBC WITH DIFFERENTIAL/PLATELET
Abs Immature Granulocytes: 0.29 10*3/uL — ABNORMAL HIGH (ref 0.00–0.07)
Basophils Absolute: 0 10*3/uL (ref 0.0–0.1)
Basophils Relative: 0 %
Eosinophils Absolute: 0 10*3/uL (ref 0.0–0.5)
Eosinophils Relative: 0 %
HCT: 37.2 % — ABNORMAL LOW (ref 39.0–52.0)
Hemoglobin: 11.8 g/dL — ABNORMAL LOW (ref 13.0–17.0)
Immature Granulocytes: 2 %
Lymphocytes Relative: 11 %
Lymphs Abs: 1.9 10*3/uL (ref 0.7–4.0)
MCH: 27 pg (ref 26.0–34.0)
MCHC: 31.7 g/dL (ref 30.0–36.0)
MCV: 85.1 fL (ref 80.0–100.0)
Monocytes Absolute: 0.9 10*3/uL (ref 0.1–1.0)
Monocytes Relative: 5 %
Neutro Abs: 13.6 10*3/uL — ABNORMAL HIGH (ref 1.7–7.7)
Neutrophils Relative %: 82 %
Platelets: 344 10*3/uL (ref 150–400)
RBC: 4.37 MIL/uL (ref 4.22–5.81)
RDW: 14.8 % (ref 11.5–15.5)
WBC: 16.6 10*3/uL — ABNORMAL HIGH (ref 4.0–10.5)
nRBC: 0.1 % (ref 0.0–0.2)

## 2021-03-14 LAB — GLUCOSE, CAPILLARY
Glucose-Capillary: 242 mg/dL — ABNORMAL HIGH (ref 70–99)
Glucose-Capillary: 195 mg/dL — ABNORMAL HIGH (ref 70–99)
Glucose-Capillary: 264 mg/dL — ABNORMAL HIGH (ref 70–99)
Glucose-Capillary: 176 mg/dL — ABNORMAL HIGH (ref 70–99)

## 2021-03-14 LAB — MAGNESIUM: Magnesium: 2.2 mg/dL (ref 1.7–2.4)

## 2021-03-14 LAB — COMPREHENSIVE METABOLIC PANEL
ALT: 16 U/L (ref 0–44)
AST: 25 U/L (ref 15–41)
Albumin: 3.5 g/dL (ref 3.5–5.0)
Alkaline Phosphatase: 75 U/L (ref 38–126)
Anion gap: 10 (ref 5–15)
BUN: 44 mg/dL — ABNORMAL HIGH (ref 8–23)
CO2: 25 mmol/L (ref 22–32)
Calcium: 9.3 mg/dL (ref 8.9–10.3)
Chloride: 102 mmol/L (ref 98–111)
Creatinine, Ser: 1.69 mg/dL — ABNORMAL HIGH (ref 0.61–1.24)
GFR, Estimated: 41 mL/min — ABNORMAL LOW (ref >60)
Glucose, Bld: 261 mg/dL — ABNORMAL HIGH (ref 70–99)
Potassium: 4.1 mmol/L (ref 3.5–5.1)
Sodium: 137 mmol/L (ref 135–145)
Total Bilirubin: 0.5 mg/dL (ref 0.3–1.2)
Total Protein: 7.6 g/dL (ref 6.5–8.1)

## 2021-03-14 LAB — PROCALCITONIN: Procalcitonin: 26.9 ng/mL

## 2021-03-14 LAB — C-REACTIVE PROTEIN: CRP: 3.9 mg/dL — ABNORMAL HIGH (ref <1.0)

## 2021-03-14 LAB — BRAIN NATRIURETIC PEPTIDE: B Natriuretic Peptide: 172.9 pg/mL — ABNORMAL HIGH (ref 0.0–100.0)

## 2021-03-14 MED ORDER — METHYLPREDNISOLONE SODIUM SUCC 40 MG IJ SOLR
30.0000 mg | INTRAMUSCULAR | Status: DC
  Administered 2021-03-15: 30 mg via INTRAVENOUS
  Filled 2021-03-14: qty 1

## 2021-03-14 MED ORDER — METHYLPREDNISOLONE SODIUM SUCC 40 MG IJ SOLR: 40.0000 mg | INTRAMUSCULAR | Status: DC

## 2021-03-14 NOTE — Progress Notes (Signed)
  Mobility Specialist Criteria Algorithm Info.  Mobility Team: [facility] elevated:Self regulated Activity: Ambulated in hall (in chair before and after ambulation) Range of motion: Active; All extremities Level of assistance: Standby assist, set-up cues, supervision of patient - no hands on Assistive device: Front wheel walker Minutes sitting in chair:  Minutes stood: 9 minutes Minutes ambulated: 9 minutes Distance ambulated (ft): 1500 ft Mobility response: Tolerated well Bed Position: Chair  Patient ambulated in hallway 1500 feet with steady gait at supervision level. Required frequent cues to walker closer to RW. Tolerated ambulation well without without incident or complaint and was left sitting in recliner chair with all needs met.   03/14/2021 3:43 PM
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
## 2021-03-14 NOTE — Progress Notes (Signed)
Results for VISHAAL, STROLLO (MRN [id number]) as of 03/14/2021 09:45  Ref. Range 03/13/2021 06:09 03/13/2021 11:52 03/13/2021 16:16 03/13/2021 20:59 03/14/2021 06:01  Glucose-Capillary Latest Ref Range: 70 - 99 mg/dL 998 (H) 338 (H) 250 (H) 317 (H) 242 (H)  Noted that blood sugars are greater than 180 mg/dl.  Recommend adding Novolog 3 units TID with meals if patient eats at least 50% of meal and if blood sugars continue to be elevated.   Smith Mince RN BSN CDE Diabetes Coordinator Pager: [phone]  8am-5pm

## 2021-03-15 LAB — COMPREHENSIVE METABOLIC PANEL
ALT: 14 U/L (ref 0–44)
AST: 16 U/L (ref 15–41)
Albumin: 2.8 g/dL — ABNORMAL LOW (ref 3.5–5.0)
Alkaline Phosphatase: 61 U/L (ref 38–126)
Anion gap: 8 (ref 5–15)
BUN: 41 mg/dL — ABNORMAL HIGH (ref 8–23)
CO2: 25 mmol/L (ref 22–32)
Calcium: 8.8 mg/dL — ABNORMAL LOW (ref 8.9–10.3)
Chloride: 103 mmol/L (ref 98–111)
Creatinine, Ser: 1.62 mg/dL — ABNORMAL HIGH (ref 0.61–1.24)
GFR, Estimated: 43 mL/min — ABNORMAL LOW (ref >60)
Glucose, Bld: 168 mg/dL — ABNORMAL HIGH (ref 70–99)
Potassium: 4.4 mmol/L (ref 3.5–5.1)
Sodium: 136 mmol/L (ref 135–145)
Total Bilirubin: 0.3 mg/dL (ref 0.3–1.2)
Total Protein: 5.9 g/dL — ABNORMAL LOW (ref 6.5–8.1)

## 2021-03-15 LAB — CBC WITH DIFFERENTIAL/PLATELET
Abs Immature Granulocytes: 0.54 10*3/uL — ABNORMAL HIGH (ref 0.00–0.07)
Basophils Absolute: 0.1 10*3/uL (ref 0.0–0.1)
Basophils Relative: 0 %
Eosinophils Absolute: 0 10*3/uL (ref 0.0–0.5)
Eosinophils Relative: 0 %
HCT: 34.3 % — ABNORMAL LOW (ref 39.0–52.0)
Hemoglobin: 11.1 g/dL — ABNORMAL LOW (ref 13.0–17.0)
Immature Granulocytes: 3 %
Lymphocytes Relative: 23 %
Lymphs Abs: 3.7 10*3/uL (ref 0.7–4.0)
MCH: 27.3 pg (ref 26.0–34.0)
MCHC: 32.4 g/dL (ref 30.0–36.0)
MCV: 84.3 fL (ref 80.0–100.0)
Monocytes Absolute: 1.4 10*3/uL — ABNORMAL HIGH (ref 0.1–1.0)
Monocytes Relative: 9 %
Neutro Abs: 10.5 10*3/uL — ABNORMAL HIGH (ref 1.7–7.7)
Neutrophils Relative %: 65 %
Platelets: 309 10*3/uL (ref 150–400)
RBC: 4.07 MIL/uL — ABNORMAL LOW (ref 4.22–5.81)
RDW: 14.6 % (ref 11.5–15.5)
WBC: 16.2 10*3/uL — ABNORMAL HIGH (ref 4.0–10.5)
nRBC: 0 % (ref 0.0–0.2)

## 2021-03-15 LAB — GLUCOSE, CAPILLARY
Glucose-Capillary: 140 mg/dL — ABNORMAL HIGH (ref 70–99)
Glucose-Capillary: 191 mg/dL — ABNORMAL HIGH (ref 70–99)
Glucose-Capillary: 203 mg/dL — ABNORMAL HIGH (ref 70–99)
Glucose-Capillary: 210 mg/dL — ABNORMAL HIGH (ref 70–99)

## 2021-03-15 LAB — TSH: TSH: 1.965 u[IU]/mL (ref 0.350–4.500)

## 2021-03-15 LAB — BRAIN NATRIURETIC PEPTIDE: B Natriuretic Peptide: 120.3 pg/mL — ABNORMAL HIGH (ref 0.0–100.0)

## 2021-03-15 LAB — MAGNESIUM: Magnesium: 1.9 mg/dL (ref 1.7–2.4)

## 2021-03-15 LAB — PROCALCITONIN: Procalcitonin: 13.46 ng/mL

## 2021-03-15 LAB — C-REACTIVE PROTEIN: CRP: 1.1 mg/dL — ABNORMAL HIGH (ref <1.0)

## 2021-03-15 MED ORDER — METHYLPREDNISOLONE SODIUM SUCC 40 MG IJ SOLR
20.0000 mg | INTRAMUSCULAR | Status: AC
  Administered 2021-03-16: 20 mg via INTRAVENOUS
  Filled 2021-03-15: qty 1

## 2021-03-15 MED ORDER — IPRATROPIUM-ALBUTEROL 0.5-2.5 (3) MG/3ML IN SOLN: 3.0000 mL | Freq: Four times a day (QID) | RESPIRATORY_TRACT | Status: DC | PRN

## 2021-03-15 NOTE — Plan of Care (Addendum)
Recommend tub bench (vs shower chair) if covered for home as pt will d/c home with sister.  Mariam Dollar, OT

## 2021-03-15 NOTE — Therapy (Signed)
Occupational Therapy Treatment Patient Details Name: Luis Carter MRN: [id number] DOB: 12-11-1945 Today's Date: 03/15/2021    History of present illness Pt is a 75 y.o. male admitted 03/11/21 with chest tightness and SOB. Workup for COPD exacerbation, community-acquired strep PNA. PMH includes COPD, DM, cardiomyopathy, asthma, CAD, CKD, substance abuse.   OT comments  Pt seen for OT ADL retraining session today with focus on functional mobility and transfers in preparation for increased independence with ADL's. Simulated tub transfer performed and discussed tub bench for safety, also simulated shower transfer w/ chair if bench is not covered. Pt is progressing with mobility and LB dressing as well as toilet transfers. Pt plans to d/c home when medically able.   Follow Up Recommendations  Home health OT;Supervision - Intermittent    Equipment Recommendations  Tub/shower bench    Recommendations for Other Services      Precautions / Restrictions Precautions Precautions: Fall       Mobility Bed Mobility Overal bed mobility: Needs Assistance Bed Mobility: Supine to Sit     Supine to sit: Supervision;HOB elevated          Transfers Overall transfer level: Needs assistance Equipment used: Rolling walker (2 wheeled) Transfers: Sit to/from [employer];Anterior-Posterior Transfer Sit to Stand: Supervision;Modified independent (Device/Increase time) Stand pivot transfers: Patent attorney transfers: Min guard        Balance Overall balance assessment: Needs assistance Sitting-balance support: No upper extremity supported;Feet supported Sitting balance-Leahy Scale: Good     Standing balance support: No upper extremity supported;During functional activity;Single extremity supported Standing balance-Leahy Scale: Fair                             ADL either performed or assessed with clinical judgement   ADL  Overall ADL's : Needs assistance/impaired     Grooming: Set up;Standing;Sitting;Supervision/safety (VC's and demo to keep RW in front of him when standing at sink for increased support PRN, as pt wanted to put off to the side)               Lower Body Dressing: Supervision/safety;Sitting/lateral leans;Sit to/from stand Roe Coombs doff socks x2 sitting and then sit to stand)   Toilet Transfer: Supervision/safety;Ambulation;Regular Toilet;RW   Toileting- Clothing Manipulation and Hygiene: Supervision/safety;Sitting/lateral lean;Sit to/from stand   Tub/ Shower Transfer: Software engineer;Ambulation;Tub bench;3 in 1;Shower seat;Rolling walker (Simulated tub transfer in pt room today, using chair/3:1 vs bed as tub bench and stepping up and over object. Pt should benefit from tub bench for safety. Will add to recommendations as pt plans to d/c home today or tomorrow w/ PRN family) Tub/Shower Transfer Details (indicate cue type and reason): VC/'s, TC's and demonstration, stepping up and over blanket/roll on floor to chair/3:1 and bed. (Pt will benefit from tub bench) Functional mobility during ADLs: Supervision/safety;Min guard;Rolling walker (Close supervision-Min guard in room during functional ADL transfers in room - No hands on assist using RW. Pt tends to keep RW in front and benefits from VC's to keep closer.) General ADL Comments: Simulated tub transfer today, pt would benefit from tub bench at d/c.     Cognition Arousal/Alertness: Awake/alert Behavior During Therapy: WFL for tasks assessed/performed Overall Cognitive Status: Within Functional Limits for tasks assessed  Shoulder Instructions       General Comments      Pertinent Vitals/ Pain       Pain Assessment: No/denies pain Faces Pain Scale: No hurt  Home Living    Lives with older sister per pt report.            Frequency  Min 2X/week        Progress  Toward Goals  OT Goals(current goals can now be found in the care plan section)     Acute Rehab OT Goals Patient Stated Goal: Go home OT Goal Formulation: With patient Time For Goal Achievement: 03/27/21 Potential to Achieve Goals: Good  Plan Discharge plan remains appropriate;Frequency remains appropriate       AM-PAC OT "6 Clicks" Daily Activity     Outcome Measure   Help from another person eating meals?: None Help from another person taking care of personal grooming?: A Little Help from another person toileting, which includes using toliet, bedpan, or urinal?: A Little Help from another person bathing (including washing, rinsing, drying)?: A Little Help from another person to put on and taking off regular upper body clothing?: A Little Help from another person to put on and taking off regular lower body clothing?: A Little 6 Click Score: 19    End of Session Equipment Utilized During Treatment: Rolling walker  OT Visit Diagnosis: Muscle weakness (generalized) (M62.81);Unsteadiness on feet (R26.81)   Activity Tolerance Patient tolerated treatment well   Patient Left in chair;with call bell/phone within reach   Nurse Communication Mobility status;Other (comment) (OT rec tub bench (discussed w/ pt that it may not be fully covered))        Time: 0815-0836 OT Time Calculation (min): 21 min  Charges: OT General Charges $OT Visit: 1 Visit OT Treatments $Self Care/Home Management : 8-22 mins   Maricel Swartzendruber Beth Dixon, OTR/L 03/15/2021, 8:58 AM
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
## 2021-03-15 NOTE — Care Management Important Message (Signed)
Important Message  Patient Details  Name: Luis Carter MRN: [id number] Date of Birth: 02-25-1946   Medicare Important Message Given:  Yes     Renie Ora 03/15/2021, 10:05 AM

## 2021-03-15 NOTE — Progress Notes (Signed)
Physical Therapy Treatment Patient Details Name: Luis Carter MRN: [id number] DOB: February 23, 1946 Today's Date: 03/15/2021    History of Present Illness Pt is a 75 y.o. male admitted 03/11/21 with chest tightness and SOB. Workup for COPD exacerbation, community-acquired strep PNA. PMH includes COPD, DM, cardiomyopathy, asthma, CAD, CKD, substance abuse.   PT Comments    Pt progressing well with mobility. Pt mod indep with transfers and ambulation with RW; demonstrates improved stability and activity tolerance. Pt has met short-term acute PT goals; reports no further questions or concerns. Reviewed education. Will d/c acute PT.    Follow Up Recommendations  Home health PT;Supervision - Intermittent     Equipment Recommendations  None recommended by PT    Recommendations for Other Services       Precautions / Restrictions Precautions Precautions: Fall Restrictions Weight Bearing Restrictions: No    Mobility  Bed Mobility               General bed mobility comments: Received sitting in recliner    Transfers Overall transfer level: Modified independent Equipment used: None;Rolling walker (2 wheeled) Transfers: Sit to/from Stand              Ambulation/Gait Ambulation/Gait assistance: Modified independent (Device/Increase time) Gait Distance (Feet): 850 Feet Assistive device: None;Rolling walker (2 wheeled) Gait Pattern/deviations: Step-through pattern;Decreased stride length;Trunk flexed Gait velocity: Decreased   General Gait Details: Mod indep ambulating with RW; short ambulation distance in room without DME, pt reaching to furniture for added support; agreeable on need for RW   Stairs             Wheelchair Mobility    Modified Rankin (Stroke Patients Only)       Balance Overall balance assessment: Mild deficits observed, not formally tested Sitting-balance support: No upper extremity supported;Feet supported Sitting balance-Leahy Scale:  Good Sitting balance - Comments: able to don shoes sitting edge of recliner   Standing balance support: No upper extremity supported;During functional activity;Single extremity supported Standing balance-Leahy Scale: Fair Standing balance comment: Can static stand and take steps without UE support; stability improved with at least single UE support                            Cognition Arousal/Alertness: Awake/alert Behavior During Therapy: WFL for tasks assessed/performed Overall Cognitive Status: Within Functional Limits for tasks assessed                                        Exercises      General Comments General comments (skin integrity, edema, etc.): VSS on RA. Educ on activity recommendations, fall risk reduction, DME needs      Pertinent Vitals/Pain Pain Assessment: No/denies pain Pain Intervention(s): Monitored during session    Home Living                      Prior Function            PT Goals (current goals can now be found in the care plan section) Progress towards PT goals: Goals met/education completed, patient discharged from PT    Frequency    Min 3X/week      PT Plan Current plan remains appropriate    Co-evaluation              AM-PAC PT "6 Clicks" Mobility  Outcome Measure  Help needed turning from your back to your side while in a flat bed without using bedrails?: None Help needed moving from lying on your back to sitting on the side of a flat bed without using bedrails?: None Help needed moving to and from a bed to a chair (including a wheelchair)?: None Help needed standing up from a chair using your arms (e.g., wheelchair or bedside chair)?: None Help needed to walk in hospital room?: None Help needed climbing 3-5 steps with a railing? : A Little 6 Click Score: 23    End of Session   Activity Tolerance: Patient tolerated treatment well Patient left: in chair;with call bell/phone within  reach Nurse Communication: Mobility status PT Visit Diagnosis: Unsteadiness on feet (R26.81);Repeated falls (R29.6)     Time: 8309-4076 PT Time Calculation (min) (ACUTE ONLY): 20 min  Charges:  $Gait Training: 8-22 mins                     Mabeline Caras, PT, DPT Acute Rehabilitation Services  Pager [phone] Office [place] 03/15/2021, 12:08 PM

## 2021-03-16 LAB — CULTURE, BLOOD (ROUTINE X 2)
Culture: NO GROWTH
Culture: NO GROWTH
Special Requests: ADEQUATE
Special Requests: ADEQUATE

## 2021-03-16 LAB — CBC WITH DIFFERENTIAL/PLATELET
Abs Immature Granulocytes: 1 10*3/uL — ABNORMAL HIGH (ref 0.00–0.07)
Basophils Absolute: 0.1 10*3/uL (ref 0.0–0.1)
Basophils Relative: 1 %
Eosinophils Absolute: 0 10*3/uL (ref 0.0–0.5)
Eosinophils Relative: 0 %
HCT: 34.5 % — ABNORMAL LOW (ref 39.0–52.0)
Hemoglobin: 10.9 g/dL — ABNORMAL LOW (ref 13.0–17.0)
Immature Granulocytes: 7 %
Lymphocytes Relative: 32 %
Lymphs Abs: 4.7 10*3/uL — ABNORMAL HIGH (ref 0.7–4.0)
MCH: 26.8 pg (ref 26.0–34.0)
MCHC: 31.6 g/dL (ref 30.0–36.0)
MCV: 84.8 fL (ref 80.0–100.0)
Monocytes Absolute: 1.3 10*3/uL — ABNORMAL HIGH (ref 0.1–1.0)
Monocytes Relative: 9 %
Neutro Abs: 7.4 10*3/uL (ref 1.7–7.7)
Neutrophils Relative %: 51 %
Platelets: 308 10*3/uL (ref 150–400)
RBC: 4.07 MIL/uL — ABNORMAL LOW (ref 4.22–5.81)
RDW: 14.6 % (ref 11.5–15.5)
WBC: 14.7 10*3/uL — ABNORMAL HIGH (ref 4.0–10.5)
nRBC: 0.1 % (ref 0.0–0.2)

## 2021-03-16 LAB — GLUCOSE, CAPILLARY
Glucose-Capillary: 132 mg/dL — ABNORMAL HIGH (ref 70–99)
Glucose-Capillary: 163 mg/dL — ABNORMAL HIGH (ref 70–99)

## 2021-03-16 MED ORDER — LEVOFLOXACIN 250 MG PO TABS: 250.0000 mg | ORAL_TABLET | Freq: Every day | ORAL | 0 refills | Status: AC

## 2021-03-16 MED ORDER — LEVOFLOXACIN 500 MG PO TABS: 500.0000 mg | ORAL_TABLET | Freq: Every day | ORAL | 0 refills | Status: DC

## 2021-03-16 MED ORDER — ASPIRIN 81 MG PO TBEC: 81.0000 mg | DELAYED_RELEASE_TABLET | Freq: Every day | ORAL | 0 refills | Status: AC

## 2021-03-16 MED ORDER — DOCUSATE SODIUM 100 MG PO CAPS: 200.0000 mg | ORAL_CAPSULE | Freq: Two times a day (BID) | ORAL | 0 refills | Status: AC | PRN

## 2021-03-16 NOTE — Discharge Instructions (Signed)
Follow with Primary MD Fleet Contras, MD in 7 days   Get CBC, CMP, 2 view Chest X ray -  checked next visit within 1 week by Primary MD    Activity: As tolerated with Full fall precautions use walker/cane & assistance as needed  Disposition Home    Diet: Heart Healthy Low Carb diet  Special Instructions: If you have smoked or chewed Tobacco  in the last 2 yrs please stop smoking, stop any regular Alcohol  and or any Recreational drug use.  On your next visit with your primary care physician please Get Medicines reviewed and adjusted.  Please request your Prim.MD to go over all Hospital Tests and Procedure/Radiological results at the follow up, please get all Hospital records sent to your Prim MD by signing hospital release before you go home.  If you experience worsening of your admission symptoms, develop shortness of breath, life threatening emergency, suicidal or homicidal thoughts you must seek medical attention immediately by calling 911 or calling your MD immediately  if symptoms less severe.  You Must read complete instructions/literature along with all the possible adverse reactions/side effects for all the Medicines you take and that have been prescribed to you. Take any new Medicines after you have completely understood and accpet all the possible adverse reactions/side effects.
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
## 2021-03-20 DIAGNOSIS — I10 Essential (primary) hypertension: Secondary | ICD-10-CM | POA: Diagnosis not present

## 2021-03-20 DIAGNOSIS — J441 Chronic obstructive pulmonary disease with (acute) exacerbation: Secondary | ICD-10-CM | POA: Diagnosis not present

## 2021-03-20 DIAGNOSIS — E1142 Type 2 diabetes mellitus with diabetic polyneuropathy: Secondary | ICD-10-CM | POA: Diagnosis not present

## 2021-03-20 DIAGNOSIS — J449 Chronic obstructive pulmonary disease, unspecified: Secondary | ICD-10-CM | POA: Diagnosis not present

## 2021-05-01 DIAGNOSIS — I10 Essential (primary) hypertension: Secondary | ICD-10-CM | POA: Diagnosis not present

## 2021-05-01 DIAGNOSIS — J060 Acute laryngopharyngitis: Secondary | ICD-10-CM | POA: Diagnosis not present

## 2021-05-01 DIAGNOSIS — E1142 Type 2 diabetes mellitus with diabetic polyneuropathy: Secondary | ICD-10-CM | POA: Diagnosis not present

## 2021-05-01 DIAGNOSIS — J302 Other seasonal allergic rhinitis: Secondary | ICD-10-CM | POA: Diagnosis not present

## 2021-05-01 DIAGNOSIS — J449 Chronic obstructive pulmonary disease, unspecified: Secondary | ICD-10-CM | POA: Diagnosis not present

## 2021-05-15 DIAGNOSIS — E0842 Diabetes mellitus due to underlying condition with diabetic polyneuropathy: Secondary | ICD-10-CM | POA: Diagnosis not present

## 2021-05-15 DIAGNOSIS — J449 Chronic obstructive pulmonary disease, unspecified: Secondary | ICD-10-CM | POA: Diagnosis not present

## 2021-05-15 DIAGNOSIS — E1142 Type 2 diabetes mellitus with diabetic polyneuropathy: Secondary | ICD-10-CM | POA: Diagnosis not present

## 2021-05-15 DIAGNOSIS — I10 Essential (primary) hypertension: Secondary | ICD-10-CM | POA: Diagnosis not present

## 2021-07-02 DIAGNOSIS — Z23 Encounter for immunization: Secondary | ICD-10-CM | POA: Diagnosis not present

## 2021-07-02 DIAGNOSIS — J449 Chronic obstructive pulmonary disease, unspecified: Secondary | ICD-10-CM | POA: Diagnosis not present

## 2021-07-02 DIAGNOSIS — E0842 Diabetes mellitus due to underlying condition with diabetic polyneuropathy: Secondary | ICD-10-CM | POA: Diagnosis not present

## 2021-07-02 DIAGNOSIS — M179 Osteoarthritis of knee, unspecified: Secondary | ICD-10-CM | POA: Diagnosis not present

## 2021-07-02 DIAGNOSIS — I10 Essential (primary) hypertension: Secondary | ICD-10-CM | POA: Diagnosis not present

## 2021-07-02 DIAGNOSIS — N3281 Overactive bladder: Secondary | ICD-10-CM | POA: Diagnosis not present

## 2021-07-02 DIAGNOSIS — M1A9XX0 Chronic gout, unspecified, without tophus (tophi): Secondary | ICD-10-CM | POA: Diagnosis not present

## 2021-07-02 DIAGNOSIS — E1142 Type 2 diabetes mellitus with diabetic polyneuropathy: Secondary | ICD-10-CM | POA: Diagnosis not present

## 2021-08-29 DIAGNOSIS — I10 Essential (primary) hypertension: Secondary | ICD-10-CM | POA: Diagnosis not present

## 2021-08-29 DIAGNOSIS — N3281 Overactive bladder: Secondary | ICD-10-CM | POA: Diagnosis not present

## 2021-08-29 DIAGNOSIS — Z0000 Encounter for general adult medical examination without abnormal findings: Secondary | ICD-10-CM | POA: Diagnosis not present

## 2021-08-29 DIAGNOSIS — K219 Gastro-esophageal reflux disease without esophagitis: Secondary | ICD-10-CM | POA: Diagnosis not present

## 2021-08-29 DIAGNOSIS — E0842 Diabetes mellitus due to underlying condition with diabetic polyneuropathy: Secondary | ICD-10-CM | POA: Diagnosis not present

## 2021-08-29 DIAGNOSIS — M179 Osteoarthritis of knee, unspecified: Secondary | ICD-10-CM | POA: Diagnosis not present

## 2021-08-29 DIAGNOSIS — E1142 Type 2 diabetes mellitus with diabetic polyneuropathy: Secondary | ICD-10-CM | POA: Diagnosis not present

## 2021-08-29 DIAGNOSIS — E7849 Other hyperlipidemia: Secondary | ICD-10-CM | POA: Diagnosis not present

## 2021-08-29 DIAGNOSIS — J449 Chronic obstructive pulmonary disease, unspecified: Secondary | ICD-10-CM | POA: Diagnosis not present

## 2021-08-29 DIAGNOSIS — M1A9XX0 Chronic gout, unspecified, without tophus (tophi): Secondary | ICD-10-CM | POA: Diagnosis not present

## 2021-09-15 ENCOUNTER — Emergency Department (HOSPITAL_COMMUNITY): Payer: Medicare Other

## 2021-09-15 ENCOUNTER — Ambulatory Visit (HOSPITAL_COMMUNITY)
Admission: EM | Admit: 2021-09-15 | Discharge: 2021-09-15 | Disposition: A | Discharge status: Nursing Home (Includes ICF) | Payer: Medicare Other | Attending: Nurse Practitioner | Admitting: Nurse Practitioner

## 2021-09-15 ENCOUNTER — Other Ambulatory Visit: Payer: Self-pay

## 2021-09-15 ENCOUNTER — Encounter (HOSPITAL_COMMUNITY): Payer: Self-pay | Admitting: Internal Medicine

## 2021-09-15 ENCOUNTER — Encounter (HOSPITAL_COMMUNITY): Payer: Self-pay | Admitting: *Deleted

## 2021-09-15 ENCOUNTER — Inpatient Hospital Stay (HOSPITAL_COMMUNITY)
Admission: EM | Admit: 2021-09-15 | Discharge: 2021-09-19 | DRG: 178 | Disposition: A | Discharge status: Home Health | Payer: Medicare Other | Attending: Internal Medicine | Admitting: Internal Medicine

## 2021-09-15 DIAGNOSIS — M25551 Pain in right hip: Secondary | ICD-10-CM | POA: Diagnosis not present

## 2021-09-15 DIAGNOSIS — Z8249 Family history of ischemic heart disease and other diseases of the circulatory system: Secondary | ICD-10-CM

## 2021-09-15 DIAGNOSIS — N1832 Chronic kidney disease, stage 3b: Secondary | ICD-10-CM | POA: Diagnosis not present

## 2021-09-15 DIAGNOSIS — I129 Hypertensive chronic kidney disease with stage 1 through stage 4 chronic kidney disease, or unspecified chronic kidney disease: Secondary | ICD-10-CM | POA: Diagnosis present

## 2021-09-15 DIAGNOSIS — R079 Chest pain, unspecified: Secondary | ICD-10-CM | POA: Diagnosis not present

## 2021-09-15 DIAGNOSIS — Z7982 Long term (current) use of aspirin: Secondary | ICD-10-CM | POA: Diagnosis not present

## 2021-09-15 DIAGNOSIS — E1122 Type 2 diabetes mellitus with diabetic chronic kidney disease: Secondary | ICD-10-CM | POA: Diagnosis not present

## 2021-09-15 DIAGNOSIS — R52 Pain, unspecified: Secondary | ICD-10-CM

## 2021-09-15 DIAGNOSIS — I252 Old myocardial infarction: Secondary | ICD-10-CM

## 2021-09-15 DIAGNOSIS — E7800 Pure hypercholesterolemia, unspecified: Secondary | ICD-10-CM | POA: Diagnosis present

## 2021-09-15 DIAGNOSIS — R0600 Dyspnea, unspecified: Secondary | ICD-10-CM | POA: Diagnosis not present

## 2021-09-15 DIAGNOSIS — R29898 Other symptoms and signs involving the musculoskeletal system: Secondary | ICD-10-CM

## 2021-09-15 DIAGNOSIS — M109 Gout, unspecified: Secondary | ICD-10-CM | POA: Diagnosis present

## 2021-09-15 DIAGNOSIS — Z7951 Long term (current) use of inhaled steroids: Secondary | ICD-10-CM

## 2021-09-15 DIAGNOSIS — Z79899 Other long term (current) drug therapy: Secondary | ICD-10-CM | POA: Diagnosis not present

## 2021-09-15 DIAGNOSIS — E0821 Diabetes mellitus due to underlying condition with diabetic nephropathy: Secondary | ICD-10-CM

## 2021-09-15 DIAGNOSIS — Z7984 Long term (current) use of oral hypoglycemic drugs: Secondary | ICD-10-CM

## 2021-09-15 DIAGNOSIS — Z87891 Personal history of nicotine dependence: Secondary | ICD-10-CM

## 2021-09-15 DIAGNOSIS — E1169 Type 2 diabetes mellitus with other specified complication: Secondary | ICD-10-CM | POA: Diagnosis present

## 2021-09-15 DIAGNOSIS — I2510 Atherosclerotic heart disease of native coronary artery without angina pectoris: Secondary | ICD-10-CM | POA: Diagnosis present

## 2021-09-15 DIAGNOSIS — I498 Other specified cardiac arrhythmias: Secondary | ICD-10-CM

## 2021-09-15 DIAGNOSIS — Z955 Presence of coronary angioplasty implant and graft: Secondary | ICD-10-CM | POA: Diagnosis not present

## 2021-09-15 DIAGNOSIS — R5381 Other malaise: Secondary | ICD-10-CM | POA: Diagnosis not present

## 2021-09-15 DIAGNOSIS — I10 Essential (primary) hypertension: Secondary | ICD-10-CM

## 2021-09-15 DIAGNOSIS — E785 Hyperlipidemia, unspecified: Secondary | ICD-10-CM | POA: Diagnosis present

## 2021-09-15 DIAGNOSIS — N179 Acute kidney failure, unspecified: Secondary | ICD-10-CM | POA: Diagnosis not present

## 2021-09-15 DIAGNOSIS — U071 COVID-19: Principal | ICD-10-CM | POA: Diagnosis present

## 2021-09-15 DIAGNOSIS — J449 Chronic obstructive pulmonary disease, unspecified: Secondary | ICD-10-CM | POA: Diagnosis present

## 2021-09-15 DIAGNOSIS — E1129 Type 2 diabetes mellitus with other diabetic kidney complication: Secondary | ICD-10-CM | POA: Diagnosis present

## 2021-09-15 DIAGNOSIS — G40909 Epilepsy, unspecified, not intractable, without status epilepticus: Secondary | ICD-10-CM | POA: Diagnosis present

## 2021-09-15 DIAGNOSIS — M7989 Other specified soft tissue disorders: Secondary | ICD-10-CM | POA: Diagnosis not present

## 2021-09-15 DIAGNOSIS — K219 Gastro-esophageal reflux disease without esophagitis: Secondary | ICD-10-CM | POA: Diagnosis not present

## 2021-09-15 DIAGNOSIS — E860 Dehydration: Secondary | ICD-10-CM | POA: Diagnosis not present

## 2021-09-15 DIAGNOSIS — R0789 Other chest pain: Secondary | ICD-10-CM | POA: Diagnosis not present

## 2021-09-15 DIAGNOSIS — M25512 Pain in left shoulder: Secondary | ICD-10-CM | POA: Diagnosis not present

## 2021-09-15 DIAGNOSIS — I429 Cardiomyopathy, unspecified: Secondary | ICD-10-CM | POA: Diagnosis not present

## 2021-09-15 DIAGNOSIS — M79604 Pain in right leg: Secondary | ICD-10-CM

## 2021-09-15 DIAGNOSIS — R778 Other specified abnormalities of plasma proteins: Secondary | ICD-10-CM | POA: Diagnosis not present

## 2021-09-15 LAB — CBC WITH DIFFERENTIAL/PLATELET
Abs Immature Granulocytes: 0.02 10*3/uL (ref 0.00–0.07)
Basophils Absolute: 0.1 10*3/uL (ref 0.0–0.1)
Basophils Relative: 1 %
Eosinophils Absolute: 0.2 10*3/uL (ref 0.0–0.5)
Eosinophils Relative: 3 %
HCT: 38.6 % — ABNORMAL LOW (ref 39.0–52.0)
Hemoglobin: 12.4 g/dL — ABNORMAL LOW (ref 13.0–17.0)
Immature Granulocytes: 0 %
Lymphocytes Relative: 25 %
Lymphs Abs: 1.7 10*3/uL (ref 0.7–4.0)
MCH: 27.3 pg (ref 26.0–34.0)
MCHC: 32.1 g/dL (ref 30.0–36.0)
MCV: 85 fL (ref 80.0–100.0)
Monocytes Absolute: 1.2 10*3/uL — ABNORMAL HIGH (ref 0.1–1.0)
Monocytes Relative: 18 %
Neutro Abs: 3.7 10*3/uL (ref 1.7–7.7)
Neutrophils Relative %: 53 %
Platelets: 207 10*3/uL (ref 150–400)
RBC: 4.54 MIL/uL (ref 4.22–5.81)
RDW: 14.9 % (ref 11.5–15.5)
WBC: 6.9 10*3/uL (ref 4.0–10.5)
nRBC: 0 % (ref 0.0–0.2)

## 2021-09-15 LAB — TROPONIN I (HIGH SENSITIVITY)
Troponin I (High Sensitivity): 18 ng/L — ABNORMAL HIGH (ref <18)
Troponin I (High Sensitivity): 24 ng/L — ABNORMAL HIGH (ref <18)

## 2021-09-15 LAB — RESP PANEL BY RT-PCR (FLU A&B, COVID) ARPGX2
Influenza A by PCR: NEGATIVE
Influenza B by PCR: NEGATIVE
SARS Coronavirus 2 by RT PCR: POSITIVE — AB

## 2021-09-15 LAB — RAPID URINE DRUG SCREEN, HOSP PERFORMED
Amphetamines: NOT DETECTED
Barbiturates: NOT DETECTED
Benzodiazepines: NOT DETECTED
Cocaine: NOT DETECTED
Opiates: NOT DETECTED
Tetrahydrocannabinol: NOT DETECTED

## 2021-09-15 LAB — BASIC METABOLIC PANEL
Anion gap: 11 (ref 5–15)
BUN: 21 mg/dL (ref 8–23)
CO2: 23 mmol/L (ref 22–32)
Calcium: 8.8 mg/dL — ABNORMAL LOW (ref 8.9–10.3)
Chloride: 103 mmol/L (ref 98–111)
Creatinine, Ser: 1.91 mg/dL — ABNORMAL HIGH (ref 0.61–1.24)
GFR, Estimated: 36 mL/min — ABNORMAL LOW (ref >60)
Glucose, Bld: 92 mg/dL (ref 70–99)
Potassium: 4.2 mmol/L (ref 3.5–5.1)
Sodium: 137 mmol/L (ref 135–145)

## 2021-09-15 LAB — CBG MONITORING, ED: Glucose-Capillary: 90 mg/dL (ref 70–99)

## 2021-09-15 MED ORDER — AMLODIPINE BESYLATE 5 MG PO TABS
5.0000 mg | ORAL_TABLET | Freq: Every day | ORAL | Status: DC
  Administered 2021-09-16: 5 mg via ORAL
  Filled 2021-09-15 (×2): qty 1

## 2021-09-15 MED ORDER — ASPIRIN EC 81 MG PO TBEC
81.0000 mg | DELAYED_RELEASE_TABLET | Freq: Every day | ORAL | Status: DC
  Administered 2021-09-16 – 2021-09-19 (×4): 81 mg via ORAL
  Filled 2021-09-15 (×4): qty 1

## 2021-09-15 MED ORDER — ENOXAPARIN SODIUM 40 MG/0.4ML IJ SOSY
40.0000 mg | PREFILLED_SYRINGE | INTRAMUSCULAR | Status: DC
  Administered 2021-09-16 – 2021-09-18 (×4): 40 mg via SUBCUTANEOUS
  Filled 2021-09-15 (×4): qty 0.4

## 2021-09-15 MED ORDER — ALBUTEROL SULFATE HFA 108 (90 BASE) MCG/ACT IN AERS: 2.0000 | INHALATION_SPRAY | Freq: Four times a day (QID) | RESPIRATORY_TRACT | Status: DC | PRN

## 2021-09-15 MED ORDER — INSULIN ASPART 100 UNIT/ML IJ SOLN
0.0000 [IU] | INTRAMUSCULAR | Status: DC
  Administered 2021-09-17 – 2021-09-18 (×3): 1 [IU] via SUBCUTANEOUS
  Administered 2021-09-19: 2 [IU] via SUBCUTANEOUS
  Administered 2021-09-19: 1 [IU] via SUBCUTANEOUS

## 2021-09-15 MED ORDER — GABAPENTIN 300 MG PO CAPS
300.0000 mg | ORAL_CAPSULE | Freq: Three times a day (TID) | ORAL | Status: DC
  Administered 2021-09-16 – 2021-09-19 (×12): 300 mg via ORAL
  Filled 2021-09-15 (×12): qty 1

## 2021-09-15 MED ORDER — ONDANSETRON HCL 4 MG/2ML IJ SOLN: 4.0000 mg | Freq: Four times a day (QID) | INTRAMUSCULAR | Status: DC | PRN

## 2021-09-15 MED ORDER — ALLOPURINOL 100 MG PO TABS
200.0000 mg | ORAL_TABLET | Freq: Every day | ORAL | Status: DC
  Administered 2021-09-16 – 2021-09-19 (×4): 200 mg via ORAL
  Filled 2021-09-15 (×5): qty 2

## 2021-09-15 MED ORDER — MOMETASONE FURO-FORMOTEROL FUM 200-5 MCG/ACT IN AERO
2.0000 | INHALATION_SPRAY | Freq: Two times a day (BID) | RESPIRATORY_TRACT | Status: DC
  Administered 2021-09-16 – 2021-09-19 (×7): 2 via RESPIRATORY_TRACT
  Filled 2021-09-15: qty 8.8

## 2021-09-15 MED ORDER — MONTELUKAST SODIUM 10 MG PO TABS
10.0000 mg | ORAL_TABLET | Freq: Every day | ORAL | Status: DC
  Administered 2021-09-16 – 2021-09-18 (×4): 10 mg via ORAL
  Filled 2021-09-15 (×6): qty 1

## 2021-09-15 MED ORDER — ATORVASTATIN CALCIUM 10 MG PO TABS
20.0000 mg | ORAL_TABLET | Freq: Every day | ORAL | Status: DC
  Administered 2021-09-16 – 2021-09-19 (×4): 20 mg via ORAL
  Filled 2021-09-15 (×4): qty 2

## 2021-09-15 MED ORDER — ACETAMINOPHEN 325 MG PO TABS
650.0000 mg | ORAL_TABLET | ORAL | Status: DC | PRN
  Administered 2021-09-16 – 2021-09-17 (×3): 650 mg via ORAL
  Filled 2021-09-15 (×3): qty 2

## 2021-09-15 MED ORDER — VITAMIN D (ERGOCALCIFEROL) 1.25 MG (50000 UNIT) PO CAPS
50000.0000 [IU] | ORAL_CAPSULE | ORAL | Status: DC
  Administered 2021-09-16: 50000 [IU] via ORAL
  Filled 2021-09-15 (×2): qty 1

## 2021-09-15 MED ORDER — FLUTICASONE PROPIONATE 50 MCG/ACT NA SUSP
2.0000 | Freq: Two times a day (BID) | NASAL | Status: DC
  Administered 2021-09-16 – 2021-09-19 (×7): 2 via NASAL
  Filled 2021-09-15: qty 16

## 2021-09-15 MED ORDER — PANTOPRAZOLE SODIUM 40 MG PO TBEC
40.0000 mg | DELAYED_RELEASE_TABLET | Freq: Every day | ORAL | Status: DC
  Administered 2021-09-16 – 2021-09-19 (×4): 40 mg via ORAL
  Filled 2021-09-15 (×4): qty 1

## 2021-09-15 MED ORDER — IPRATROPIUM-ALBUTEROL 0.5-2.5 (3) MG/3ML IN SOLN: 3.0000 mL | Freq: Four times a day (QID) | RESPIRATORY_TRACT | Status: DC | PRN

## 2021-09-15 MED ORDER — MOLNUPIRAVIR EUA 200MG CAPSULE
4.0000 | ORAL_CAPSULE | Freq: Two times a day (BID) | ORAL | Status: DC
  Administered 2021-09-16 – 2021-09-19 (×7): 800 mg via ORAL
  Filled 2021-09-15: qty 4

## 2021-09-15 MED ORDER — ASPIRIN 81 MG PO CHEW
324.0000 mg | CHEWABLE_TABLET | Freq: Once | ORAL | Status: AC
  Administered 2021-09-15: 324 mg via ORAL

## 2021-09-15 MED ORDER — LISINOPRIL 20 MG PO TABS
20.0000 mg | ORAL_TABLET | Freq: Every day | ORAL | Status: DC
  Administered 2021-09-16: 20 mg via ORAL
  Filled 2021-09-15: qty 1

## 2021-09-15 MED ORDER — ALBUTEROL SULFATE HFA 108 (90 BASE) MCG/ACT IN AERS
2.0000 | INHALATION_SPRAY | RESPIRATORY_TRACT | Status: DC | PRN
  Administered 2021-09-15: 2 via RESPIRATORY_TRACT
  Filled 2021-09-15: qty 6.7

## 2021-09-15 MED ORDER — ASPIRIN 81 MG PO CHEW
CHEWABLE_TABLET | ORAL | Status: AC
  Filled 2021-09-15: qty 4

## 2021-09-15 NOTE — ED Triage Notes (Signed)
Pt at urgent care for complaint of shortness of breath since yesterday, stayed home trying nebtx and inhalers without improvement.  Chest pain worsening, urgent care did ekg, gave aspirin 324mg  and PIV 20 rt AC.

## 2021-09-15 NOTE — ED Notes (Signed)
Called CHarge RN, no answer will reattempt to call.

## 2021-09-15 NOTE — ED Triage Notes (Signed)
C/O SOB and feeling of "asthma attach" since last night. States has been taking nebs and inhalers.

## 2021-09-15 NOTE — ED Notes (Signed)
Patient repositioned in bed, warm blankets applied. Patient resting in stretcher comfortably. Denies any needs at this time. Call bell within reach. Stretcher in low and locked position. Side rails up x2.

## 2021-09-15 NOTE — Assessment & Plan Note (Addendum)
AKI likely hemodynamically mediated-due to COVID-19 infection/dehydration.  Creatinine is improved with supportive care-PCP to recheck electrolytes in 1 week.

## 2021-09-15 NOTE — H&P (Signed)
History and Physical    Patient: Luis Carter [id number] DOB: [birth year] DOA: 09/15/2021 DOS: the patient was seen and examined on 09/15/2021 PCP: Nolene Ebbs, MD  Patient coming from: Home  Chief Complaint: CP, foot pain  HPI: Luis Carter is a 76 y.o. male with medical history significant of HTN, HLD, CKD 3B, DM2, gout, COPD, prior cocaine use.  Pt presents to ED today with c/o foot pain in R foot, onset yesterday, coming and going, no radiation.  Nothing makes better or worse.  No known injuries.  More concerning to EDP is that pt also complains of CP intermittently since yesterday.  Across whole chest.  Some SOB.  No N/V, no diaphoresis, no coughing.  Subjective fever yesterday but didn't take temperature.  At this time in ED, both foot pain and CP have resolved.     Review of Systems: As mentioned in the history of present illness. All other systems reviewed and are negative. Past Medical History:  Diagnosis Date   Arthritis    "left arm/shoulder; both legs" (12/12/2015)   ASTHMA    since childhood   Cardiomyopathy ([place]) 01/25/2016   Echo 12/13/15 - Mild LVH, EF 45-50%, normal wall motion, grade 1 diastolic dysfunction, mild LAE, mild RVE, mild RAE   COPD 04/14/2007   Coronary artery disease    DIABETES MELLITUS, TYPE II 04/14/2007   GERD (gastroesophageal reflux disease)    GI bleed    Gout    High cholesterol    History of nuclear stress test    a.  Myoview 6/17: EF 54%, no ischemia   HYPERTENSION 04/14/2007   LEG PAIN, RIGHT 05/19/2008   LOW BACK PAIN 04/14/2007   Myocardial infarction ([place])    ~ 2001/notes 09/13/2001 (03/22/2013)   PANCREATITIS, HX OF 04/14/2007   Pneumonia    "twice" (12/12/2015)   SEIZURE DISORDER 04/14/2007   "used to have them when I was young" (03/22/2013)   Shortness of breath    "related to asthma" (03/22/2013)   Past Surgical History:  Procedure Laterality Date   CORONARY ANGIOPLASTY WITH STENT PLACEMENT      ESOPHAGOGASTRODUODENOSCOPY  12/21/2011   Procedure: ESOPHAGOGASTRODUODENOSCOPY (EGD);  Surgeon: Inda Castle, MD;  Location: [facility];  Service: Endoscopy;  Laterality: N/A;   Social History:  reports that he quit smoking about 5 years ago. His smoking use included cigarettes. He has a 14.25 pack-year smoking history. He has never used smokeless tobacco. He reports that he does not currently use alcohol. He reports that he does not currently use drugs after having used the following drugs: Cocaine.  No Known Allergies  Family History  Problem Relation Age of Onset   Heart disease Sister    Heart disease Brother    Heart disease Brother    Heart disease Brother    Heart disease Brother     Prior to Admission medications   Medication Sig Start Date End Date Taking? Authorizing Provider  albuterol (PROAIR HFA) 108 (90 BASE) MCG/ACT inhaler INHALE 2 PUFFS INTO THE LUNGS EVERY 6 (SIX) HOURS AS NEEDED FOR WHEEZING. Patient taking differently: Inhale 2 puffs into the lungs every 6 (six) hours as needed for shortness of breath or wheezing. 03/05/15  Yes Marletta Lor, MD  allopurinol (ZYLOPRIM) 100 MG tablet Take 2 tablets (200 mg total) by mouth daily. 03/05/15  Yes Marletta Lor, MD  amLODipine (NORVASC) 5 MG tablet TAKE 1 TABLET BY MOUTH EVERY DAY Patient taking differently: Take 5 mg by  mouth daily. 06/02/16  Yes Marletta Lor, MD  aspirin ([employer] ASPIRIN LOW DOSE) 81 MG EC tablet Take 1 tablet (81 mg total) by mouth daily. Swallow whole. 03/16/21  Yes Thurnell Lose, MD  atorvastatin (LIPITOR) 20 MG tablet TAKE 1 TABLET BY MOUTH EVERY DAY Patient taking differently: Take 20 mg by mouth daily. 06/02/16  Yes Marletta Lor, MD  colchicine (COLCRYS) 0.6 MG tablet Take 1 tablet (0.6 mg total) by mouth 2 (two) times daily as needed (gout flares). 02/13/21  Yes Arrien, Jimmy Picket, MD  fluticasone [facility]) 50 MCG/ACT nasal spray Place 2 sprays into both nostrils 2  (two) times daily. 02/13/21 09/15/21 Yes Arrien, Jimmy Picket, MD  fluticasone-salmeterol (ADVAIR) 250-50 MCG/ACT AEPB Inhale 1 puff into the lungs in the morning and at bedtime. Patient taking differently: Inhale 1 puff into the lungs 2 (two) times daily as needed (wheezing/shortness of breath). 02/13/21 09/15/21 Yes Arrien, Jimmy Picket, MD  gabapentin (NEURONTIN) 300 MG capsule Take 300 mg by mouth 3 (three) times daily. 01/19/21  Yes [provider]  ibuprofen (ADVIL) 200 MG tablet Take 600 mg by mouth every 6 (six) hours as needed for headache or moderate pain.   Yes [provider]  ipratropium-albuterol (DUONEB) 0.5-2.5 (3) MG/3ML SOLN Take 3 mLs by nebulization every 6 (six) hours as needed. Patient taking differently: Take 3 mLs by nebulization every 6 (six) hours as needed (shortness of breath). 02/13/21  Yes Arrien, Jimmy Picket, MD  lisinopril (ZESTRIL) 20 MG tablet Take 20 mg by mouth daily. 03/08/21  Yes [provider]  meloxicam (MOBIC) 7.5 MG tablet Take 7.5 mg by mouth 2 (two) times daily as needed for pain. 12/12/20  Yes [provider]  metFORMIN (GLUCOPHAGE) 500 MG tablet Take 1 tablet (500 mg total) by mouth 2 (two) times daily. 10/13/18  Yes Debbe Odea, MD  montelukast (SINGULAIR) 10 MG tablet Take 1 tablet (10 mg total) by mouth at bedtime. 03/05/15  Yes Marletta Lor, MD  omeprazole (PRILOSEC) 20 MG capsule Take 1 capsule (20 mg total) by mouth daily. 03/05/15  Yes Marletta Lor, MD  Vitamin D, Ergocalciferol, (DRISDOL) 1.25 MG (50000 UNIT) CAPS capsule Take 50,000 Units by mouth every Monday.   Yes [provider]    Physical Exam: Vitals:   09/15/21 2130 09/15/21 2145 09/15/21 2200 09/15/21 2215  BP: (!) 107/54 99/62  135/77  Pulse: 85 81 86 79  Resp: 12 16 (!) 21 12  Temp:      TempSrc:      SpO2: 99% 99% 98% 98%   Constitutional: NAD, calm, comfortable Eyes: PERRL, lids and conjunctivae normal ENMT:  Mucous membranes are moist. Posterior pharynx clear of any exudate or lesions.Normal dentition.  Neck: normal, supple, no masses, no thyromegaly Respiratory: clear to auscultation bilaterally, no wheezing, no crackles. Normal respiratory effort. No accessory muscle use.  Cardiovascular: Regular rate and rhythm, no murmurs / rubs / gallops. No extremity edema. 2+ pedal pulses. No carotid bruits.  Abdomen: no tenderness, no masses palpated. No hepatosplenomegaly. Bowel sounds positive.  Musculoskeletal: no clubbing / cyanosis. No joint deformity upper and lower extremities. Good ROM, no contractures. Normal muscle tone.  Skin: no rashes, lesions, ulcers. No induration Neurologic: CN 2-12 grossly intact. Sensation intact, DTR normal. Strength 5/5 in all 4.  Psychiatric: Normal judgment and insight. Alert and oriented x 3. Normal mood.    Data Reviewed:  See assessment and plan below Trops 18 and 24 CXR neg  Creat 1.9 up from 1.6 baseline COVID-19 positive EKG = bigeminy  Assessment/Plan * Acute chest pain- (present on admission) HEART score = 6.  Trops of 18 and 24.  CP free at the moment in ED. CP obs pathway NPO after [facility] Tele monitor Cards eval in AM for possible stress test vs LHC COVID has just come back positive, possibly explaining his CP and slight trop bump.  COVID-19 virus infection- (present on admission) COVID-19 testing just came back positive.  This could explain his CP, SOB, subjective fevers and slight trop elevation.  No PNA on CXR, no new O2 requirement. COVID pathway No O2 requirement Given renal fxn will start molnupiravir Daily labs  HLD (hyperlipidemia)- (present on admission) Continue statin Check FLP in AM  Chronic kidney disease, stage 3b ([place])- (present on admission) Creat of 1.9 today up slightly from 1.6 prior baseline Hold metformin Stop NSAID use Will leave lisinopril alone for the moment Repeat BMP in AM  COPD (chronic obstructive pulmonary  disease) ([place])- (present on admission) Doesn't seem to have acute exacerbation today. Continue current home nebs Need to use the inhaled steroid / LABA scheduled, not PRN  Hypertension- (present on admission) Continue home BP meds when med rec is completed  Diabetes mellitus with renal complications ([place])- (present on admission) Hold metformin due to creat 1.9 today Sensitive SSI Q4H while NPO    Advance Care Planning:   Code Status: Full Code   Consults: Message sent to P. Abagail Kitchens for AM cards eval  Family Communication:    Severity of Illness: The appropriate patient status for this patient is OBSERVATION. Observation status is judged to be reasonable and necessary in order to provide the required intensity of service to ensure the patient's safety. The patient's presenting symptoms, physical exam findings, and initial radiographic and laboratory data in the context of their medical condition is felt to place them at decreased risk for further clinical deterioration. Furthermore, it is anticipated that the patient will be medically stable for discharge from the hospital within 2 midnights of admission.   Author: Etta Quill., DO 09/15/2021 11:13 PM  For on call review [email or web address].

## 2021-09-15 NOTE — Assessment & Plan Note (Addendum)
Last A1c 6.6 on 03/12/2021.  CBG stable with SSI.  Resume metformin prior to discharge.    Recent Labs    09/17/21 2355 09/18/21 0356 09/18/21 0811  GLUCAP 103* 99 89

## 2021-09-15 NOTE — ED Notes (Signed)
[facility] notified of transport to ED needed.

## 2021-09-15 NOTE — Assessment & Plan Note (Addendum)
Not hypoxic-no PNA on CXR-continue  molnupiravir x 5 days

## 2021-09-15 NOTE — ED Notes (Signed)
Called [facility] RN phone without answer. Made [facility] aware.

## 2021-09-15 NOTE — ED Notes (Signed)
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

## 2021-09-15 NOTE — Assessment & Plan Note (Addendum)
Not in exacerbation-continue bronchodilators. °

## 2021-09-15 NOTE — Assessment & Plan Note (Addendum)
No further chest pain-stable EF on echo-no further recommendations from cardiology.

## 2021-09-15 NOTE — Assessment & Plan Note (Addendum)
BP was soft a few days ago-slowly improving-systolic in the low 100s today.  Continue to hold amlodipine/enalapril until seen by PCP.

## 2021-09-15 NOTE — ED Provider Notes (Signed)
[facility] EMERGENCY DEPARTMENT Provider Note   CSN: [id number] Arrival date & time: 09/15/21  1709     History  Chief Complaint  Patient presents with   Chest Pain    shob    Luis Carter is a 76 y.o. male.  Patient is a 76 year old male who presents with foot pain and chest pain.  He has a history of diabetes, hyperlipidemia, hypertension, coronary artery disease status post prior stent placement and COPD.  Initially he just complained about his foot.  He said he is having a lot of pain in his right foot.  He says its in the whole foot.  He says it started yesterday.  Its been coming and going.  It does not go up his leg.  Nothing seems to make it worse or better.  He is a very poor historian and hard to get information about of.  Does not report any known injuries.  He says he is also having some chest pain intermittently since yesterday.  He says its across his whole chest.  He has some associated shortness of breath.  No nausea or vomiting.  No diaphoresis.  No known coughing.  He said he had a fever yesterday but he cannot tell me why he thinks that.  He did not actually check his temperature.  He initially went to urgent care and was sent here for further evaluation.      Home Medications Prior to Admission medications   Medication Sig Start Date End Date Taking? Authorizing Provider  albuterol (PROAIR HFA) 108 (90 BASE) MCG/ACT inhaler INHALE 2 PUFFS INTO THE LUNGS EVERY 6 (SIX) HOURS AS NEEDED FOR WHEEZING. 03/05/15   Marletta Lor, MD  allopurinol (ZYLOPRIM) 100 MG tablet Take 2 tablets (200 mg total) by mouth daily. Patient not taking: No sig reported 03/05/15   Marletta Lor, MD  amLODipine (NORVASC) 5 MG tablet TAKE 1 TABLET BY MOUTH EVERY DAY 06/02/16   Marletta Lor, MD  aspirin ([employer] ASPIRIN LOW DOSE) 81 MG EC tablet Take 1 tablet (81 mg total) by mouth daily. Swallow whole. 03/16/21   Thurnell Lose, MD  atorvastatin  (LIPITOR) 20 MG tablet TAKE 1 TABLET BY MOUTH EVERY DAY 06/02/16   Marletta Lor, MD  Cholecalciferol (D3 PO) Take 1 capsule by mouth daily.    [provider]  colchicine (COLCRYS) 0.6 MG tablet Take 1 tablet (0.6 mg total) by mouth 2 (two) times daily as needed (gout flares). 02/13/21   Arrien, Jimmy Picket, MD  fluticasone (FLONASE) 50 MCG/ACT nasal spray Place 2 sprays into both nostrils 2 (two) times daily. 02/13/21 03/15/21  Arrien, Jimmy Picket, MD  fluticasone-salmeterol (ADVAIR) 250-50 MCG/ACT AEPB Inhale 1 puff into the lungs in the morning and at bedtime. Patient taking differently: Inhale 1 puff into the lungs 2 (two) times daily as needed (wheezing/shortness of breath). 02/13/21 03/15/21  Arrien, Jimmy Picket, MD  gabapentin (NEURONTIN) 300 MG capsule Take 300 mg by mouth 3 (three) times daily. 01/19/21   [provider]  ipratropium-albuterol (DUONEB) 0.5-2.5 (3) MG/3ML SOLN Take 3 mLs by nebulization every 6 (six) hours as needed. 02/13/21   Arrien, Jimmy Picket, MD  lisinopril (ZESTRIL) 20 MG tablet Take 20 mg by mouth daily. 03/08/21   [provider]  meloxicam (MOBIC) 7.5 MG tablet Take 7.5 mg by mouth 2 (two) times daily as needed for pain. 12/12/20   [provider]  metFORMIN (GLUCOPHAGE) 500 MG tablet Take  1 tablet (500 mg total) by mouth 2 (two) times daily. 10/13/18   Debbe Odea, MD  montelukast (SINGULAIR) 10 MG tablet Take 1 tablet (10 mg total) by mouth at bedtime. 03/05/15   Marletta Lor, MD  omeprazole (PRILOSEC) 20 MG capsule Take 1 capsule (20 mg total) by mouth daily. 03/05/15   Marletta Lor, MD      Allergies    Patient has no known allergies.    Review of Systems   Review of Systems  Constitutional:  Negative for chills, diaphoresis, fatigue and fever.  HENT:  Negative for congestion, rhinorrhea and sneezing.   Eyes: Negative.   Respiratory:  Positive for shortness of breath. Negative for cough and  chest tightness.   Cardiovascular:  Positive for chest pain. Negative for leg swelling.  Gastrointestinal:  Negative for abdominal pain, blood in stool, diarrhea, nausea and vomiting.  Genitourinary:  Negative for difficulty urinating, flank pain, frequency and hematuria.  Musculoskeletal:  Positive for arthralgias. Negative for back pain.  Skin:  Negative for rash.  Neurological:  Negative for dizziness, speech difficulty, weakness, numbness and headaches.   Physical Exam Updated Vital Signs BP 135/77    Pulse 79    Temp 98 F (36.7 C) (Oral)    Resp 12    SpO2 98%  Physical Exam Constitutional:      Appearance: He is well-developed.  HENT:     Head: Normocephalic and atraumatic.  Eyes:     Pupils: Pupils are equal, round, and reactive to light.  Cardiovascular:     Rate and Rhythm: Normal rate and regular rhythm.     Heart sounds: Normal heart sounds.  Pulmonary:     Effort: Pulmonary effort is normal. No respiratory distress.     Breath sounds: Normal breath sounds. No wheezing or rales.  Chest:     Chest wall: No tenderness.  Abdominal:     General: Bowel sounds are normal.     Palpations: Abdomen is soft.     Tenderness: There is no abdominal tenderness. There is no guarding or rebound.  Musculoskeletal:        General: Normal range of motion.     Cervical back: Normal range of motion and neck supple.     Comments: No visible swelling or deformity to the right foot.  Pedal pulses were not palpable but posterior tibial pulses strong by Doppler.  Unable to ascertain any dorsalis pedis pulse.  His foot is warm and well-perfused.  No bony tenderness noted.  He has normal sensation and motor function to the foot.  Lymphadenopathy:     Cervical: No cervical adenopathy.  Skin:    General: Skin is warm and dry.     Findings: No rash.  Neurological:     Mental Status: He is alert and oriented to person, place, and time.    ED Results / Procedures / Treatments   Labs (all  labs ordered are listed, but only abnormal results are displayed) Labs Reviewed  BASIC METABOLIC PANEL - Abnormal; Notable for the following components:      Result Value   Creatinine, Ser 1.91 (*)    Calcium 8.8 (*)    GFR, Estimated 36 (*)    All other components within normal limits  CBC WITH DIFFERENTIAL/PLATELET - Abnormal; Notable for the following components:   Hemoglobin 12.4 (*)    HCT 38.6 (*)    Monocytes Absolute 1.2 (*)    All other components within normal limits  TROPONIN I (HIGH SENSITIVITY) - Abnormal; Notable for the following components:   Troponin I (High Sensitivity) 18 (*)    All other components within normal limits  TROPONIN I (HIGH SENSITIVITY) - Abnormal; Notable for the following components:   Troponin I (High Sensitivity) 24 (*)    All other components within normal limits  RESP PANEL BY RT-PCR (FLU A&B, COVID) ARPGX2  RAPID URINE DRUG SCREEN, HOSP PERFORMED    EKG EKG Interpretation  Date/Time:  Sunday September 15 2021 17:20:30 EST Ventricular Rate:  90 PR Interval:  155 QRS Duration: 100 QT Interval:  383 QTC Calculation: 405 R Axis:   68 Text Interpretation: Sinus rhythm Ventricular bigeminy LAE, consider biatrial enlargement Confirmed by Malvin Johns [phone]) on 09/15/2021 5:23:44 PM  Radiology DG Chest Port 1 View  Result Date: 09/15/2021 CLINICAL DATA:  Chest pain EXAM: PORTABLE CHEST 1 VIEW COMPARISON:  03/13/2021 FINDINGS: Heart and mediastinal contours are within normal limits. No focal opacities or effusions. No acute bony abnormality. IMPRESSION: No active disease. Electronically Signed   By: Rolm Baptise M.D.   On: 09/15/2021 17:36    Procedures Procedures    Medications Ordered in ED Medications  albuterol (VENTOLIN HFA) 108 (90 Base) MCG/ACT inhaler 2 puff (2 puffs Inhalation Given 09/15/21 2140)    ED Course/ Medical Decision Making/ A&P                           Medical Decision Making Amount and/or Complexity of Data  Reviewed External Data Reviewed: ECG and notes. Labs: ordered. Decision-making details documented in ED Course. Radiology: ordered and independent interpretation performed. Decision-making details documented in ED Course. ECG/medicine tests: ordered and independent interpretation performed. Decision-making details documented in ED Course.  Risk Decision regarding hospitalization.   Patient is a 76 year old male who presents with chest pain.  He also has some right foot pain but this seems to have resolved.  He does not have any ongoing chest pain.  Its hard to tell if it is exertional or not.  He does have some associated shortness of breath.  His EKG does not show any ischemic changes although he is having some runs of ventricular bigeminy.  His troponins are mildly elevated.  His creatinine is slightly higher than it has been in the past.  His chest x-ray which was reviewed by me he has no acute findings.  No evidence of pneumonia or pulmonary edema.  He does have several risk factors for heart disease.  He has an elevated heart score.  Given his elevated heart score with some mild elevation in his troponins, will plan for admission for further cardiac evaluation.  I consulted with Dr. Alcario Drought he will admit the patient for further treatment.  Final Clinical Impression(s) / ED Diagnoses Final diagnoses:  Nonspecific chest pain  Ventricular bigeminy    Rx / DC Orders ED Discharge Orders     None         Malvin Johns, MD 09/15/21 2242

## 2021-09-15 NOTE — Assessment & Plan Note (Addendum)
Continue statin. 

## 2021-09-15 NOTE — ED Notes (Signed)
NSR noted with frequent unifocal PVCs noted, and intermittent bouts of bigeminy.

## 2021-09-15 NOTE — ED Provider Notes (Signed)
Luis Carter    CSN: [id number] Arrival date & time: 09/15/21  1612      History   Chief Complaint Chief Complaint  Patient presents with   Shortness of Breath    HPI Luis Carter is a 76 y.o. male.   Subjective:  [name] is a 76 y.o. male who presents for evaluation of chest pain. Onset was 1 day ago. Symptoms have worsened since that time. The patient describes the pain as achy and it does not radiate. Patient rates pain as severe in intensity. Associated symptoms include chest pressure/discomfort, dyspnea, and fatigue. He denies any aggravating factors or alleviating factors. He tried albuterol nebs/inhaler without any relief in symptoms. Patient has complex medical history which includes advanced age, diabetes mellitus, dyslipidemia, hypertension, male gender, CAD, cardiomyopathy, cardiac stent, COPD.   The following portions of the patient's history were reviewed and updated as appropriate: allergies, current medications, past family history, past medical history, past social history, past surgical history, and problem list.      Past Medical History:  Diagnosis Date   Arthritis    "left arm/shoulder; both legs" (12/12/2015)   ASTHMA    since childhood   Cardiomyopathy ([place]) 01/25/2016   Echo 12/13/15 - Mild LVH, EF 45-50%, normal wall motion, grade 1 diastolic dysfunction, mild LAE, mild RVE, mild RAE   COPD 04/14/2007   Coronary artery disease    DIABETES MELLITUS, TYPE II 04/14/2007   GERD (gastroesophageal reflux disease)    GI bleed    Gout    High cholesterol    History of nuclear stress test    a.  Myoview 6/17: EF 54%, no ischemia   HYPERTENSION 04/14/2007   LEG PAIN, RIGHT 05/19/2008   LOW BACK PAIN 04/14/2007   Myocardial infarction ([place]    ~ 2001/notes 09/13/2001 (03/22/2013)   PANCREATITIS, HX OF 04/14/2007   Pneumonia    "twice" (12/12/2015)   SEIZURE DISORDER 04/14/2007   "used to have them when I was young" (03/22/2013)    Shortness of breath    "related to asthma" (03/22/2013)    Patient Active Problem List   Diagnosis Date Noted   Acute exacerbation of chronic obstructive pulmonary disease (COPD) ([place]) 03/11/2021   SIRS (systemic inflammatory response syndrome) ([place]) 03/11/2021   Malnutrition of moderate degree 02/11/2021   Hyperkalemia 02/10/2021   Dyspnea 02/08/2021   Community acquired pneumonia of right upper lobe of lung    Respiratory failure with hypoxia and hypercapnia ([place]) 10/11/2018   Acute pain of left shoulder 10/15/2016   Pain in right leg 10/15/2016   Weakness of right leg 10/15/2016   Cardiomyopathy ([place]) 01/25/2016   Chronic kidney disease, stage 3b ([place]) 12/13/2015   Abnormal echocardiogram 12/13/2015   Syncope 12/12/2015   Tobacco use disorder 09/07/2015   Gout 01/27/2012   Cocaine use 12/22/2011   Acute chest pain 12/18/2008   Diabetes mellitus with renal complications ([place]) [id number]   Hypertension 04/14/2007   Asthma 04/14/2007   COPD (chronic obstructive pulmonary disease) ([place]) 04/14/2007   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 Authorizing Provider  albuterol (PROAIR HFA) 108 (90 BASE) MCG/ACT inhaler INHALE 2 PUFFS INTO THE LUNGS EVERY 6 (SIX) HOURS AS NEEDED FOR WHEEZING. 03/05/15   Marletta Lor, MD  allopurinol (ZYLOPRIM) 100 MG tablet Take 2 tablets (200 mg total) by mouth daily. Patient not taking: No sig reported 03/05/15   Marletta Lor, MD  amLODipine (NORVASC) 5 MG tablet TAKE 1 TABLET BY MOUTH  EVERY DAY 06/02/16   Marletta Lor, MD  aspirin ([employer] ASPIRIN LOW DOSE) 81 MG EC tablet Take 1 tablet (81 mg total) by mouth daily. Swallow whole. 03/16/21   Thurnell Lose, MD  atorvastatin (LIPITOR) 20 MG tablet TAKE 1 TABLET BY MOUTH EVERY DAY 06/02/16   Marletta Lor, MD  Cholecalciferol (D3 PO) Take 1 capsule by mouth daily.    [provider]  colchicine (COLCRYS) 0.6 MG tablet Take 1 tablet (0.6 mg total) by mouth 2 (two) times daily as needed (gout flares). 02/13/21   Arrien, Jimmy Picket, MD  fluticasone (FLONASE) 50 MCG/ACT nasal spray Place 2 sprays into both nostrils 2 (two) times daily. 02/13/21 03/15/21  Arrien, Jimmy Picket, MD  fluticasone-salmeterol (ADVAIR) 250-50 MCG/ACT AEPB Inhale 1 puff into the lungs in the morning and at bedtime. Patient taking differently: Inhale 1 puff into the lungs 2 (two) times daily as needed (wheezing/shortness of breath). 02/13/21 03/15/21  Arrien, Jimmy Picket, MD  gabapentin (NEURONTIN) 300 MG capsule Take 300 mg by mouth 3 (three) times daily. 01/19/21   [provider]  ipratropium-albuterol (DUONEB) 0.5-2.5 (3) MG/3ML SOLN Take 3 mLs by nebulization every 6 (six) hours as needed. 02/13/21   Arrien, Jimmy Picket, MD  lisinopril (ZESTRIL) 20 MG tablet Take 20 mg by mouth daily. 03/08/21   [provider]  meloxicam (MOBIC) 7.5 MG tablet Take 7.5 mg by mouth 2 (two) times daily as needed for pain. 12/12/20   [provider]  metFORMIN (GLUCOPHAGE) 500 MG tablet Take 1 tablet (500 mg total) by mouth 2 (two) times daily. 10/13/18   Debbe Odea, MD  montelukast (SINGULAIR) 10 MG tablet Take 1 tablet (10 mg total) by mouth at bedtime. 03/05/15   Marletta Lor, MD  omeprazole (PRILOSEC) 20 MG capsule Take 1 capsule (20 mg total) by mouth daily. 03/05/15   Marletta Lor, MD    Family History Family History  Problem Relation Age of Onset   Heart disease Sister    Heart disease Brother     Heart disease Brother    Heart disease Brother    Heart disease Brother     Social History Social History   Tobacco Use   Smoking status: Former    Packs/day: 0.25    Years: 57.00    Pack years: 14.25    Types: Cigarettes    Quit date: 11/24/2015    Years since quitting: 5.8   Smokeless tobacco: Never  Substance Use Topics   Alcohol use: Not Currently    Comment: 12/12/2015 "quit drinking years ago; never had problem w/it"   Drug use: No     Allergies   Patient has no known allergies.   Review of Systems Review of Systems  Constitutional:  Positive for fatigue.  Respiratory:  Positive for shortness of breath.   Cardiovascular:  Positive for chest pain and palpitations. Negative for leg swelling.  Gastrointestinal:  Negative for nausea and vomiting.  Neurological:  Positive for weakness.  All  other systems reviewed and are negative.   Physical Exam Triage Vital Signs ED Triage Vitals  Enc Vitals Group     BP 09/15/21 1631 139/71     Pulse Rate 09/15/21 1631 93     Resp 09/15/21 1631 (!) 24     Temp 09/15/21 1631 98.1 F (36.7 C)     Temp Source 09/15/21 1631 Oral     SpO2 09/15/21 1631 100 %     Weight --      Height --      Head Circumference --      Peak Flow --      Pain Score 09/15/21 1633 3     Pain Loc --      Pain Edu? --      Excl. in [place]? --    No data found.  Updated Vital Signs BP 139/71    Pulse 93 Comment: irregular   Temp 98.1 F (36.7 C) (Oral)    Resp (!) 24    SpO2 100%   Visual Acuity Right Eye Distance:   Left Eye Distance:   Bilateral Distance:    Right Eye Near:   Left Eye Near:    Bilateral Near:     Physical Exam Constitutional:      Appearance: He is well-developed. He is not ill-appearing, toxic-appearing or diaphoretic.     Comments: Uncomfortable   HENT:     Head: Normocephalic.  Cardiovascular:     Rate and Rhythm: Normal rate and regular rhythm.  Pulmonary:     Effort: Tachypnea present.  Chest:     Chest  wall: No tenderness.  Abdominal:     Palpations: Abdomen is soft.  Musculoskeletal:        General: Normal range of motion.     Cervical back: Normal range of motion and neck supple.  Skin:    General: Skin is warm and dry.  Neurological:     General: No focal deficit present.     Mental Status: He is alert and oriented to person, place, and time.     UC Treatments / Results  Labs (all labs ordered are listed, but only abnormal results are displayed) Labs Reviewed - No data to display  EKG   Radiology No results found.  Procedures ED EKG  Date/Time: 09/15/2021 4:44 PM Performed by: Enrique Sack, FNP Authorized by: Enrique Sack, FNP   ECG reviewed by ED Physician in the absence of a cardiologist: yes   Previous ECG:    Previous ECG:  Compared to current Interpretation:    Interpretation: non-specific   Rate:    ECG rate:  93   ECG rate assessment: normal   Rhythm:    Rhythm: sinus rhythm   Ectopy:    Ectopy: bigeminy and PVCs   QRS:    Details:  96 ms (including critical care time)  Medications Ordered in UC Medications  aspirin chewable tablet 324 mg (has no administration in time range)    Initial Impression / Assessment and Plan / UC Course  I have reviewed the triage vital signs and the nursing notes.  Pertinent labs & imaging results that were available during my care of the patient were reviewed by me and considered in my medical decision making (see chart for details).    76 year old male with complex medical history presenting with a 1 day history of chest pain and shortness of breath.  He thought that it could be his COPD but has not had any  relief in his symptoms despite albuterol neb/inhaler.  Denies any aggravating or alleviating factors.  Patient evaluated emergently in the triage area.  He is alert and oriented x3.  Obviously uncomfortable but nontoxic.  He is afebrile.  EKG showing frequent bigeminy PVCs otherwise unremarkable. BP  stable. Tachypnea with RR of 24.  IV started.  Aspirin 324 mg p.o. given. Due to his age, history and presenting complaints, patient emergently referred to the ED for a more complex evaluation and treatment.  Nursing to call [facility] for transportation to the ED.    Patient comfortable with plan and disposition to ED.  Patient has a clear mental status at this time, good insight into illness (after discussion and teaching) and has clear judgment to make decisions regarding their care  This care was provided during an unprecedented National Emergency due to the Novel Coronavirus (COVID-19) pandemic. COVID-19 infections and transmission risks place heavy strains on healthcare resources.  As this pandemic evolves, our facility, providers, and staff strive to respond fluidly, to remain operational, and to provide care relative to available resources and information. Outcomes are unpredictable and treatments are without well-defined guidelines. Further, the impact of COVID-19 on all aspects of urgent care, including the impact to patients seeking care for reasons other than COVID-19, is unavoidable during this national emergency. At this time of the global pandemic, management of patients has significantly changed, even for non-COVID positive patients given high local and regional COVID volumes at this time requiring high healthcare system and resource utilization. The standard of care for management of both COVID suspected and non-COVID suspected patients continues to change rapidly at the local, regional, national, and global levels. This patient was worked up and treated to the best available but ever changing evidence and resources available at this current time.   Documentation was completed with the aid of voice recognition software. Transcription may contain typographical errors. Final Clinical Impressions(s) / UC Diagnoses   Final diagnoses:  Chest pain, unspecified type  Dyspnea, unspecified type    Discharge Instructions   None     ED Prescriptions   None    PDMP not reviewed this encounter.   Enrique Sack, Luis Carter 09/15/21 [phone]

## 2021-09-15 NOTE — Hospital Course (Addendum)
76 year old with a history of HTN, HLD, CKD stage IIIb, DM-2, COPD, remote history of cocaine use-who presented with retrosternal chest pain x2 weeks, and right hip pain x2 days.  Patient was subsequently admitted to the hospitalist service.  Significant events: 1/22>> admit to [facility].

## 2021-09-15 NOTE — ED Notes (Signed)
Patient is being discharged from the Urgent Care and sent to the Emergency Department via [facility] . Per Lurline Idol, NP, patient is in need of higher level of care due to chest pain and shortness of breath. Patient is aware and verbalizes understanding of plan of care.  Vitals:   09/15/21 1631  BP: 139/71  Pulse: 93  Resp: (!) 24  Temp: 98.1 F (36.7 C)  SpO2: 100%

## 2021-09-16 ENCOUNTER — Other Ambulatory Visit (HOSPITAL_COMMUNITY): Payer: Medicare Other

## 2021-09-16 ENCOUNTER — Observation Stay (HOSPITAL_BASED_OUTPATIENT_CLINIC_OR_DEPARTMENT_OTHER): Payer: Medicare Other

## 2021-09-16 ENCOUNTER — Observation Stay (HOSPITAL_COMMUNITY): Payer: Medicare Other

## 2021-09-16 ENCOUNTER — Encounter (HOSPITAL_COMMUNITY): Payer: Self-pay | Admitting: Internal Medicine

## 2021-09-16 DIAGNOSIS — M25512 Pain in left shoulder: Secondary | ICD-10-CM | POA: Diagnosis not present

## 2021-09-16 DIAGNOSIS — R778 Other specified abnormalities of plasma proteins: Secondary | ICD-10-CM

## 2021-09-16 DIAGNOSIS — J449 Chronic obstructive pulmonary disease, unspecified: Secondary | ICD-10-CM | POA: Diagnosis not present

## 2021-09-16 DIAGNOSIS — M7989 Other specified soft tissue disorders: Secondary | ICD-10-CM

## 2021-09-16 DIAGNOSIS — U071 COVID-19: Secondary | ICD-10-CM | POA: Diagnosis not present

## 2021-09-16 DIAGNOSIS — R079 Chest pain, unspecified: Secondary | ICD-10-CM | POA: Diagnosis not present

## 2021-09-16 DIAGNOSIS — M25551 Pain in right hip: Secondary | ICD-10-CM

## 2021-09-16 DIAGNOSIS — N1832 Chronic kidney disease, stage 3b: Secondary | ICD-10-CM | POA: Diagnosis not present

## 2021-09-16 LAB — CBC WITH DIFFERENTIAL/PLATELET
Abs Immature Granulocytes: 0.02 10*3/uL (ref 0.00–0.07)
Basophils Absolute: 0 10*3/uL (ref 0.0–0.1)
Basophils Relative: 1 %
Eosinophils Absolute: 0.1 10*3/uL (ref 0.0–0.5)
Eosinophils Relative: 2 %
HCT: 37.7 % — ABNORMAL LOW (ref 39.0–52.0)
Hemoglobin: 12 g/dL — ABNORMAL LOW (ref 13.0–17.0)
Immature Granulocytes: 0 %
Lymphocytes Relative: 27 %
Lymphs Abs: 1.8 10*3/uL (ref 0.7–4.0)
MCH: 27.3 pg (ref 26.0–34.0)
MCHC: 31.8 g/dL (ref 30.0–36.0)
MCV: 85.9 fL (ref 80.0–100.0)
Monocytes Absolute: 1.3 10*3/uL — ABNORMAL HIGH (ref 0.1–1.0)
Monocytes Relative: 20 %
Neutro Abs: 3.3 10*3/uL (ref 1.7–7.7)
Neutrophils Relative %: 50 %
Platelets: 197 10*3/uL (ref 150–400)
RBC: 4.39 MIL/uL (ref 4.22–5.81)
RDW: 15.1 % (ref 11.5–15.5)
WBC: 6.6 10*3/uL (ref 4.0–10.5)
nRBC: 0 % (ref 0.0–0.2)

## 2021-09-16 LAB — CBG MONITORING, ED
Glucose-Capillary: 117 mg/dL — ABNORMAL HIGH (ref 70–99)
Glucose-Capillary: 68 mg/dL — ABNORMAL LOW (ref 70–99)
Glucose-Capillary: 76 mg/dL (ref 70–99)
Glucose-Capillary: 77 mg/dL (ref 70–99)
Glucose-Capillary: 77 mg/dL (ref 70–99)
Glucose-Capillary: 78 mg/dL (ref 70–99)

## 2021-09-16 LAB — COMPREHENSIVE METABOLIC PANEL
ALT: 13 U/L (ref 0–44)
AST: 29 U/L (ref 15–41)
Albumin: 3.2 g/dL — ABNORMAL LOW (ref 3.5–5.0)
Alkaline Phosphatase: 82 U/L (ref 38–126)
Anion gap: 6 (ref 5–15)
BUN: 23 mg/dL (ref 8–23)
CO2: 22 mmol/L (ref 22–32)
Calcium: 8.1 mg/dL — ABNORMAL LOW (ref 8.9–10.3)
Chloride: 106 mmol/L (ref 98–111)
Creatinine, Ser: 1.99 mg/dL — ABNORMAL HIGH (ref 0.61–1.24)
GFR, Estimated: 34 mL/min — ABNORMAL LOW (ref >60)
Glucose, Bld: 95 mg/dL (ref 70–99)
Potassium: 4 mmol/L (ref 3.5–5.1)
Sodium: 134 mmol/L — ABNORMAL LOW (ref 135–145)
Total Bilirubin: 0.8 mg/dL (ref 0.3–1.2)
Total Protein: 6.3 g/dL — ABNORMAL LOW (ref 6.5–8.1)

## 2021-09-16 LAB — LIPID PANEL
Cholesterol: 104 mg/dL (ref 0–200)
HDL: 30 mg/dL — ABNORMAL LOW (ref >40)
LDL Cholesterol: 62 mg/dL (ref 0–99)
Total CHOL/HDL Ratio: 3.5 RATIO
Triglycerides: 62 mg/dL (ref <150)
VLDL: 12 mg/dL (ref 0–40)

## 2021-09-16 LAB — D-DIMER, QUANTITATIVE: D-Dimer, Quant: 1 ug/mL-FEU — ABNORMAL HIGH (ref 0.00–0.50)

## 2021-09-16 LAB — C-REACTIVE PROTEIN: CRP: 3.3 mg/dL — ABNORMAL HIGH (ref <1.0)

## 2021-09-16 MED ORDER — SODIUM CHLORIDE 0.9 % IV SOLN: INTRAVENOUS | Status: AC

## 2021-09-16 NOTE — ED Notes (Signed)
Pt care taken, no complaints at this time. 

## 2021-09-16 NOTE — ED Notes (Signed)
Patient transported to X-ray 

## 2021-09-16 NOTE — Assessment & Plan Note (Addendum)
X-ray of bilateral hips negative for any fractures-Dopplers negative for DVT.  No concerning findings on exam.  Continues to complain of pain in hips/knee areas.  Suspect has myalgias/arthritis related to COVID-19 infection.  Continue supportive care-we will arrange for home health services on discharge.

## 2021-09-16 NOTE — ED Notes (Signed)
Vascular tech at bedside. °

## 2021-09-16 NOTE — ED Notes (Signed)
Luis Carter sister [phone] requesting to speak to the patient

## 2021-09-16 NOTE — Assessment & Plan Note (Signed)
Does not seem to be in exacerbation-continue allopurinol.

## 2021-09-16 NOTE — Progress Notes (Signed)
Reevaluated patient-has been cleared by cardiology for discharge with plans for outpatient echocardiogram.  Lower extremity Dopplers are negative.  Unfortunately he is too weak-and is significantly more debilitated than his usual baseline.  Per patient-he needed significant amount of assistance to get into position to do the hip x-ray.  He does not think he can go home with this level of weakness.  We will have physical therapy evaluate and reassess tomorrow after their evaluation.  Informed cardiology-since patient is staying-they will proceed with echocardiogram as well.

## 2021-09-16 NOTE — Progress Notes (Signed)
Bilateral lower extremity venous duplex has been completed. Preliminary results can be found in CV Proc through chart review.   09/16/21 3:47 PM Olen Cordial RVT

## 2021-09-16 NOTE — Progress Notes (Signed)
PROGRESS NOTE        PATIENT DETAILS Name: Luis Carter Age: 76 y.o. Sex: male Date of Birth: 09/14/1945 Admit Date: 09/15/2021 Admitting Physician Etta Quill, DO [id number], Christean Grief, MD  Brief Summary: 76 year old with a history of HTN, HLD, CKD stage IIIb, DM-2, COPD, remote history of cocaine use-who presented with retrosternal chest pain x2 weeks, and right hip pain x2 days.  Patient was subsequently admitted to the hospitalist service.  Significant events: 1/22>> admit to [facility].   Subjective: Lying comfortably in bed-denies any chest pain or shortness of breath.  Complains of pain in the right hip.  Objective: Vitals: Blood pressure (!) 137/39, pulse 80, temperature 98 F (36.7 C), temperature source Oral, resp. rate 17, SpO2 98 %.   Exam: Gen Exam:Alert awake-not in any distress HEENT:atraumatic, normocephalic Chest: B/L clear to auscultation anteriorly CVS:S1S2 regular Abdomen:soft non tender, non distended Extremities:no edema-?  Mild swelling in the upper thigh. Neurology: Non focal Skin: no rash  Pertinent Labs/Radiology: CBC Latest Ref Rng & Units 09/16/2021 09/15/2021 03/16/2021  WBC 4.0 - 10.5 K/uL 6.6 6.9 14.7(H)  Hemoglobin 13.0 - 17.0 g/dL 12.0(L) 12.4(L) 10.9(L)  Hematocrit 39.0 - 52.0 % 37.7(L) 38.6(L) 34.5(L)  Platelets 150 - 400 K/uL 197 207 308    Lab Results  Component Value Date   NA 134 (L) 09/16/2021   K 4.0 09/16/2021   CL 106 09/16/2021   CO2 22 09/16/2021      Assessment/Plan: * Acute chest pain- (present on admission) No chest pain this morning-but has been having intermittent chest pain for approximately 2 weeks.  Has numerous risk factors for CAD.  Twelve-lead EKG/troponins nonacute. Awaiting cardiology input.     Acute right hip pain- (present on admission) Claims he has right hip/upper thigh pain for the past few days-D-dimer is elevated-we will check Doppler and x-rays.  COVID-19 virus  infection- (present on admission) Not hypoxic-no PNA on CXR-continue  molnupiravir  Chronic kidney disease, stage 3b ([place])- (present on admission) Creatinine fluctuating but not far from his prior baseline.  Avoid nephrotoxic agents.    Hypertension- (present on admission) BP stable-continue amlodipine/enalapril.  Follow renal function-May need to stop enalapril if worsens.    Diabetes mellitus with renal complications ([place])- (present on admission) Last A1c 6.6 on 03/12/2021.  CBG stable with SSI.  Resume metformin prior to discharge.    Recent Labs    09/15/21 2339 09/16/21 0338 09/16/21 0857  GLUCAP 90 77 68    HLD (hyperlipidemia)- (present on admission) Continue statin  COPD (chronic obstructive pulmonary disease) ([place])- (present on admission) Not in exacerbation-continue bronchodilators.  Gout- (present on admission) Does not seem to be in exacerbation-continue allopurinol.   BMI: Estimated body mass index is 27.11 kg/m as calculated from the following:   Height as of 03/12/21: 5\' 9"  (1.753 m).   Weight as of 03/16/21: 83.3 kg.   DVT Prophylaxis: SQ Lovenox Procedures: None Consults: Cardiology. Code Status:Full code  Family Communication: None at bedside   Disposition Plan: Status is: Observation  The patient remains OBS appropriate and will d/c before 2 midnights.  Cardiology eval/right hip pain evaluation/work-up in progress.  Not yet stable for discharge.    Diet: Diet Order             Diet NPO time specified Except for: Sips with Meds  Diet effective midnight  Antimicrobial agents: Anti-infectives (From admission, onward)    Start     Dose/Rate Route Frequency Ordered Stop   09/15/21 2330  molnupiravir EUA (LAGEVRIO) capsule 800 mg        4 capsule Oral 2 times daily 09/15/21 2322 09/20/21 2159        MEDICATIONS: Scheduled Meds:  allopurinol  200 mg Oral Daily   amLODipine  5 mg Oral Daily   aspirin EC  81 mg  Oral Daily   atorvastatin  20 mg Oral Daily   enoxaparin (LOVENOX) injection  40 mg Subcutaneous Q24H   fluticasone  2 spray Each Nare BID   gabapentin  300 mg Oral TID   insulin aspart  0-9 Units Subcutaneous Q4H   lisinopril  20 mg Oral Daily   molnupiravir EUA  4 capsule Oral BID   mometasone-formoterol  2 puff Inhalation BID   montelukast  10 mg Oral QHS   pantoprazole  40 mg Oral Daily   Vitamin D (Ergocalciferol)  50,000 Units Oral Q Mon   Continuous Infusions: PRN Meds:.acetaminophen, albuterol, ipratropium-albuterol, ondansetron (ZOFRAN) IV   I have personally reviewed following labs and imaging studies  LABORATORY DATA: CBC: Recent Labs  Lab 09/15/21 1745 09/16/21 0039  WBC 6.9 6.6  NEUTROABS 3.7 3.3  HGB 12.4* 12.0*  HCT 38.6* 37.7*  MCV 85.0 85.9  PLT 207 [id number]    Basic Metabolic Panel: Recent Labs  Lab 09/15/21 1745 09/16/21 0039  NA 137 134*  K 4.2 4.0  CL 103 106  CO2 23 22  GLUCOSE 92 95  BUN 21 23  CREATININE 1.91* 1.99*  CALCIUM 8.8* 8.1*    GFR: CrCl cannot be calculated (Unknown ideal weight.).  Liver Function Tests: Recent Labs  Lab 09/16/21 0039  AST 29  ALT 13  ALKPHOS 82  BILITOT 0.8  PROT 6.3*  ALBUMIN 3.2*   No results for input(s): LIPASE, AMYLASE in the last 168 hours. No results for input(s): AMMONIA in the last 168 hours.  Coagulation Profile: No results for input(s): INR, PROTIME in the last 168 hours.  Cardiac Enzymes: No results for input(s): CKTOTAL, CKMB, CKMBINDEX, TROPONINI in the last 168 hours.  BNP (last 3 results) No results for input(s): PROBNP in the last 8760 hours.  Lipid Profile: Recent Labs    09/16/21 0041  CHOL 104  HDL 30*  LDLCALC 62  TRIG 62  CHOLHDL 3.5    Thyroid Function Tests: No results for input(s): TSH, T4TOTAL, FREET4, T3FREE, THYROIDAB in the last 72 hours.  Anemia Panel: No results for input(s): VITAMINB12, FOLATE, FERRITIN, TIBC, IRON, RETICCTPCT in the last 72  hours.  Urine analysis:    Component Value Date/Time   COLORURINE YELLOW 03/12/2021 0712   APPEARANCEUR HAZY (A) 03/12/2021 0712   LABSPEC 1.012 03/12/2021 0712   PHURINE 5.0 03/12/2021 0712   GLUCOSEU NEGATIVE 03/12/2021 0712   HGBUR MODERATE (A) 03/12/2021 0712   BILIRUBINUR NEGATIVE 03/12/2021 0712   KETONESUR NEGATIVE 03/12/2021 0712   PROTEINUR NEGATIVE 03/12/2021 0712   UROBILINOGEN 1.0 03/21/2013 2316   NITRITE NEGATIVE 03/12/2021 0712   LEUKOCYTESUR LARGE (A) 03/12/2021 0712    Sepsis Labs: Lactic Acid, Venous    Component Value Date/Time   LATICACIDVEN 2.3 (HH) 03/11/2021 2311    MICROBIOLOGY: Recent Results (from the past 240 hour(s))  Resp Panel by RT-PCR (Flu A&B, Covid)     Status: Abnormal   Collection Time: 09/15/21 10:20 PM   Specimen: Nasopharyngeal(NP) swabs in vial transport medium  Result Value Ref Range Status   SARS Coronavirus 2 by RT PCR POSITIVE (A) NEGATIVE Final    Comment: (NOTE) SARS-CoV-2 target nucleic acids are DETECTED.  The SARS-CoV-2 RNA is generally detectable in upper respiratory specimens during the acute phase of infection. Positive results are indicative of the presence of the identified virus, but do not rule out bacterial infection or co-infection with other pathogens not detected by the test. Clinical correlation with patient history and other diagnostic information is necessary to determine patient infection status. The expected result is Negative.  Fact Sheet for Patients: EntrepreneurPulse.com.au  Fact Sheet for Healthcare Providers: IncredibleEmployment.be  This test is not yet approved or cleared by the [place] FDA and  has been authorized for detection and/or diagnosis of SARS-CoV-2 by FDA under an Emergency Use Authorization (EUA).  This EUA will remain in effect (meaning this test can be used) for the duration of  the COVID-19 declaration under Section 564(b)(1) of the A  ct, 21 U.S.C. section 360bbb-3(b)(1), unless the authorization is terminated or revoked sooner.     Influenza A by PCR NEGATIVE NEGATIVE Final   Influenza B by PCR NEGATIVE NEGATIVE Final    Comment: (NOTE) The Xpert Xpress SARS-CoV-2/FLU/RSV plus assay is intended as an aid in the diagnosis of influenza from Nasopharyngeal swab specimens and should not be used as a sole basis for treatment. Nasal washings and aspirates are unacceptable for Xpert Xpress SARS-CoV-2/FLU/RSV testing.  Fact Sheet for Patients: EntrepreneurPulse.com.au  Fact Sheet for Healthcare Providers: IncredibleEmployment.be  This test is not yet approved or cleared by the [place] FDA and has been authorized for detection and/or diagnosis of SARS-CoV-2 by FDA under an Emergency Use Authorization (EUA). This EUA will remain in effect (meaning this test can be used) for the duration of the COVID-19 declaration under Section 564(b)(1) of the Act, 21 U.S.C. section 360bbb-3(b)(1), unless the authorization is terminated or revoked.  Performed at [facility], [place] [address], [place], [place] [address]     RADIOLOGY STUDIES/RESULTS: DG Chest Port 1 View  Result Date: 09/15/2021 CLINICAL DATA:  Chest pain EXAM: PORTABLE CHEST 1 VIEW COMPARISON:  03/13/2021 FINDINGS: Heart and mediastinal contours are within normal limits. No focal opacities or effusions. No acute bony abnormality. IMPRESSION: No active disease. Electronically Signed   By: Rolm Baptise M.D.   On: 09/15/2021 17:36     LOS: 0 days   Oren Binet, MD  [facility]   To contact the attending provider between 7A-7P or the covering provider during after hours 7P-7A, please log into the web site [email or web address] and access using universal [place] password for that web site. If you do not have the password, please call the hospital operator.  09/16/2021, 10:07 AM

## 2021-09-16 NOTE — Consult Note (Addendum)
The patient has been seen in conjunction with Fabian Sharp, PAC. All aspects of care have been considered and discussed. The patient has been personally interviewed, examined, and all clinical data has been reviewed.  Chest pain with coughing, although has given alternative description to others. Currently pain free and does not recur unless coughing ECK demonstrates ventricular bigeminy. No acute STTWA. Elevated markers in setting of positive Covid-19 PCR. R/O viral myocarditis. Please do 2 D Doppler echocardiogram. Could also explain ventricular ectopy. 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 male with a hx of DM, hypertension, mild cardiomyopathy, CKD stage IIIb, hyperlipidemia, COPD, prior cocaine abuse and current smoker who is being seen 09/16/2021 for the evaluation of chest pain at the request of Dr. Sloan Leiter.  History of Present Illness:   Mr. Klaver has a possible history of prior MI in 2001, we do not have these records.  Nuclear stress test at that time was negative and he has never undergone heart catheterization.  He was seen by our service in 2017 after an episode of syncope.  Echocardiogram in 12/13/2015 showed an LVEF of 45 to [id number], grade 1 diastolic dysfunction, and no significant valvular disease.  Nuclear stress test June 2017 was nonischemic.  Heart monitor at that time revealed no arrhythmia.  Syncope consistent with post micturition syncope.  He does have a history of bradycardia and has not been started on beta-blocker therapy.  He was hospitalized in February 2020 with COPD exacerbation.  He was last seen by our service in May 2020 following that hospitalization and was doing well at  that time.  He has had ED visits and admissions for dyspnea and COPD exacerbation.  He was last hospitalized in July 2022 with COPD exacerbation and treated for CAP.  Echocardiogram at that time showed an improved LVEF of 60 to [id number], grade 1 diastolic dysfunction, ascending aorta of 41 mm, and no significant valvular disease.  He presented to [facility] ED with foot pain and chest pain.  His cardiac enzymes were mildly elevated.  Given his risk factors he was admitted to medicine service and cardiology was consulted. While in the ER both his chest pain and foot pain resolved.  Of note he is COVID-19 positive.  HST 18 --> 24 EKG with ventricular bigeminy, ST depression  lateral leads seen on prior tracings.  During my interview, he describes coughing associated dyspnea and chest pain. He states this started yesterday, although he has told other providers this started 2 weeks ago.  He has not experienced chest pain outside of a coughing spell. Generally he is able to walk to the [employer] without angina. He is chest pain free now and has not been coughing.   He has been vaccinated against COVID-19. He states he quit smoking and has not used cocaine in many years. He does not drink alcohol.     Past Medical History:  Diagnosis Date   Arthritis    "left arm/shoulder; both legs" (12/12/2015)   ASTHMA    since childhood   Cardiomyopathy ([place]) 01/25/2016   Echo 12/13/15 - Mild LVH, EF 45-50%, normal wall motion, grade 1 diastolic dysfunction, mild LAE, mild RVE, mild RAE  COPD 04/14/2007   Coronary artery disease    DIABETES MELLITUS, TYPE II 04/14/2007   GERD (gastroesophageal reflux disease)    GI bleed    Gout    High cholesterol    History of nuclear stress test    a.  Myoview 6/17: EF 54%, no ischemia   HYPERTENSION 04/14/2007   LEG PAIN, RIGHT 05/19/2008   LOW BACK PAIN 04/14/2007   Myocardial infarction (HCC)    ~ 2001/notes 09/13/2001 (03/22/2013)   PANCREATITIS, HX OF 04/14/2007   Pneumonia     "twice" (12/12/2015)   SEIZURE DISORDER 04/14/2007   "used to have them when I was young" (03/22/2013)   Shortness of breath    "related to asthma" (03/22/2013)    Past Surgical History:  Procedure Laterality Date   CORONARY ANGIOPLASTY WITH STENT PLACEMENT     ESOPHAGOGASTRODUODENOSCOPY  12/21/2011   Procedure: ESOPHAGOGASTRODUODENOSCOPY (EGD);  Surgeon: Louis Meckel, MD;  Location[facility] END[facility]COPY;  Service: Endoscopy;  Laterality: N/A;     Home Medications:  Prior to Admission medications   Medication Sig Start Date End Date Taking? Authorizing Provider  albuterol (PROAIR HFA) 108 (90 BASE) MCG/ACT inhaler INHALE 2 PUFFS INTO THE LUNGS EVERY 6 (SIX) HOURS AS NEEDED FOR WHEEZING. Patient taking differently: Inhale 2 puffs into the lungs every 6 (six) hours as needed for shortness of breath or wheezing. 03/05/15  Yes Gordy Savers, MD  allopurinol (ZYLOPRIM) 100 MG tablet Take 2 tablets (200 mg total) by mouth daily. 03/05/15  Yes Gordy Savers, MD  amLODipine (NORVASC) 5 MG tablet TAKE 1 TABLET BY MOUTH EVERY DAY Patient taking differently: Take 5 mg by mouth daily. 06/02/16  Yes Gordy Savers, MD  aspirin ([employer] ASPIRIN LOW DOSE) 81 MG EC tablet Take 1 tablet (81 mg total) by mouth daily. Swallow whole. 03/16/21  Yes Leroy Sea, MD  atorvastatin (LIPITOR) 20 MG tablet TAKE 1 TABLET BY MOUTH EVERY DAY Patient taking differently: Take 20 mg by mouth daily. 06/02/16  Yes Gordy Savers, MD  colchicine (COLCRYS) 0.6 MG tablet Take 1 tablet (0.6 mg total) by mouth 2 (two) times daily as needed (gout flares). 02/13/21  Yes Arrien, York Ram, MD  fluticasone [facility]) 50 MCG/ACT nasal spray Place 2 sprays into both nostrils 2 (two) times daily. 02/13/21 09/15/21 Yes Arrien, York Ram, MD  fluticasone-salmeterol (ADVAIR) 250-50 MCG/ACT AEPB Inhale 1 puff into the lungs in the morning and at bedtime. Patient taking differently: Inhale 1 puff into the lungs 2 (two)  times daily as needed (wheezing/shortness of breath). 02/13/21 09/15/21 Yes Arrien, York Ram, MD  gabapentin (NEURONTIN) 300 MG capsule Take 300 mg by mouth 3 (three) times daily. 01/19/21  Yes [provider]  ibuprofen (ADVIL) 200 MG tablet Take 600 mg by mouth every 6 (six) hours as needed for headache or moderate pain.   Yes [provider]  ipratropium-albuterol (DUONEB) 0.5-2.5 (3) MG/3ML SOLN Take 3 mLs by nebulization every 6 (six) hours as needed. Patient taking differently: Take 3 mLs by nebulization every 6 (six) hours as needed (shortness of breath). 02/13/21  Yes Arrien, York Ram, MD  lisinopril (ZESTRIL) 20 MG tablet Take 20 mg by mouth daily. 03/08/21  Yes [provider]  meloxicam (MOBIC) 7.5 MG tablet Take 7.5 mg by mouth 2 (two) times daily as needed for pain. 12/12/20  Yes [provider]  metFORMIN (GLUCOPHAGE) 500 MG tablet Take 1 tablet (500 mg total) by mouth 2 (two) times daily.  10/13/18  Yes Debbe Odea, MD  montelukast (SINGULAIR) 10 MG tablet Take 1 tablet (10 mg total) by mouth at bedtime. 03/05/15  Yes Marletta Lor, MD  omeprazole (PRILOSEC) 20 MG capsule Take 1 capsule (20 mg total) by mouth daily. 03/05/15  Yes Marletta Lor, MD  Vitamin D, Ergocalciferol, (DRISDOL) 1.25 MG (50000 UNIT) CAPS capsule Take 50,000 Units by mouth every Monday.   Yes [provider]    Inpatient Medications: Scheduled Meds:  allopurinol  200 mg Oral Daily   amLODipine  5 mg Oral Daily   aspirin EC  81 mg Oral Daily   atorvastatin  20 mg Oral Daily   enoxaparin (LOVENOX) injection  40 mg Subcutaneous Q24H   fluticasone  2 spray Each Nare BID   gabapentin  300 mg Oral TID   insulin aspart  0-9 Units Subcutaneous Q4H   lisinopril  20 mg Oral Daily   molnupiravir EUA  4 capsule Oral BID   mometasone-formoterol  2 puff Inhalation BID   montelukast  10 mg Oral QHS   pantoprazole  40 mg Oral Daily   Vitamin D  (Ergocalciferol)  50,000 Units Oral Q Mon   Continuous Infusions:  PRN Meds: acetaminophen, albuterol, ipratropium-albuterol, ondansetron (ZOFRAN) IV  Allergies:   No Known Allergies  Social History:   Social History   Socioeconomic History   Marital status: Married    Spouse name: Not on file   Number of children: Not on file   Years of education: Not on file   Highest education level: Not on file  Occupational History   Not on file  Tobacco Use   Smoking status: Former    Packs/day: 0.25    Years: 57.00    Pack years: 14.25    Types: Cigarettes    Quit date: 11/24/2015    Years since quitting: 5.8   Smokeless tobacco: Never  Substance and Sexual Activity   Alcohol use: Not Currently    Comment: 12/12/2015 "quit drinking years ago; never had problem w/it"   Drug use: Not Currently    Types: Cocaine   Sexual activity: Not on file  Other Topics Concern   Not on file  Social History Narrative   Not on file   Social Determinants of Health   Financial Resource Strain: Not on file  Food Insecurity: Not on file  Transportation Needs: Not on file  Physical Activity: Not on file  Stress: Not on file  Social Connections: Not on file  Intimate Partner Violence: Not on file    Family History:    Family History  Problem Relation Age of Onset   Heart disease Sister    Heart disease Brother    Heart disease Brother    Heart disease Brother    Heart disease Brother      ROS:  Please see the history of present illness.   All other ROS reviewed and negative.     Physical Exam/Data:   Vitals:   09/16/21 1125 09/16/21 1200 09/16/21 1300 09/16/21 1400  BP: (!) 142/61 122/61 111/62 104/63  Pulse: 69 (!) 57 69 69  Resp: 20 19 20 19   Temp:      TempSrc:      SpO2: 99% 99% 99% 99%   No intake or output data in the 24 hours ending 09/16/21 1434 Last 3 Weights 03/16/2021 03/15/2021 03/14/2021  Weight (lbs) 183 lb 9.6 oz 180 lb 3.2 oz 178 lb 14.4 oz  Weight (kg) 83.28  kg 81.738 kg 81.149 kg     There is no height or weight on file to calculate BMI.  General:  Well nourished, well developed, in no acute distress HEENT: normal Neck: no JVD Vascular: No carotid bruits; Distal pulses 2+ bilaterally Cardiac:  normal S1, S2; RRR; no murmur  Lungs:  clear to auscultation bilaterally, no wheezing, rhonchi or rales  Abd: soft, nontender, no hepatomegaly  Ext: no edema Musculoskeletal:  No deformities, BUE and BLE strength normal and equal Skin: warm and dry  Neuro:  CNs 2-12 intact, no focal abnormalities noted Psych:  Normal affect   EKG:  The EKG was personally reviewed and demonstrates:  sinus rhythm 80 with ventricular bigeminy, ST depression lateral leads Telemetry:  Telemetry was personally reviewed and demonstrates:  sinus with PVCs and episodes of bigeminy  Relevant CV Studies:  Echo 03/12/21: 1. Left ventricular ejection fraction, by estimation, is 60 to 65%. The  left ventricle has normal function. The left ventricle has no regional  wall motion abnormalities. Left ventricular diastolic parameters are  consistent with Grade I diastolic  dysfunction (impaired relaxation).   2. Right ventricular systolic function is normal. The right ventricular  size is normal.   3. The mitral valve is normal in structure. No evidence of mitral valve  regurgitation. No evidence of mitral stenosis.   4. The aortic valve is normal in structure. Aortic valve regurgitation is  not visualized. Mild aortic valve sclerosis is present, with no evidence  of aortic valve stenosis.   5. There is mild dilatation of the ascending aorta, measuring 41 mm.   6. The inferior vena cava is normal in size with greater than 50%  respiratory variability, suggesting right atrial pressure of 3 mmHg.   Laboratory Data:  High Sensitivity Troponin:   Recent Labs  Lab 09/15/21 1745 09/15/21 1941  TROPONINIHS 18* 24*     Chemistry Recent Labs  Lab 09/15/21 1745 09/16/21 0039   NA 137 134*  K 4.2 4.0  CL 103 106  CO2 23 22  GLUCOSE 92 95  BUN 21 23  CREATININE 1.91* 1.99*  CALCIUM 8.8* 8.1*  GFRNONAA 36* 34*  ANIONGAP 11 6    Recent Labs  Lab 09/16/21 0039  PROT 6.3*  ALBUMIN 3.2*  AST 29  ALT 13  ALKPHOS 82  BILITOT 0.8   Lipids  Recent Labs  Lab 09/16/21 0041  CHOL 104  TRIG 62  HDL 30*  LDLCALC 62  CHOLHDL 3.5    Hematology Recent Labs  Lab 09/15/21 1745 09/16/21 0039  WBC 6.9 6.6  RBC 4.54 4.39  HGB 12.4* 12.0*  HCT 38.6* 37.7*  MCV 85.0 85.9  MCH 27.3 27.3  MCHC 32.1 31.8  RDW 14.9 15.1  PLT 207 197   Thyroid No results for input(s): TSH, FREET4 in the last 168 hours.  BNPNo results for input(s): BNP, PROBNP in the last 168 hours.  DDimer  Recent Labs  Lab 09/16/21 0039  DDIMER 1.00*     Radiology/Studies:  DG Chest Port 1 View  Result Date: 09/15/2021 CLINICAL DATA:  Chest pain EXAM: PORTABLE CHEST 1 VIEW COMPARISON:  03/13/2021 FINDINGS: Heart and mediastinal contours are within normal limits. No focal opacities or effusions. No acute bony abnormality. IMPRESSION: No active disease. Electronically Signed   By: Rolm Baptise M.D.   On: 09/15/2021 17:36   DG HIP UNILAT WITH PELVIS 2-3 VIEWS RIGHT  Result Date: 09/16/2021 CLINICAL DATA:  Right hip pain for 2 days  EXAM: DG HIP (WITH OR WITHOUT PELVIS) 2-3V RIGHT COMPARISON:  None. FINDINGS: There is no evidence of hip fracture or dislocation. Mild joint space narrowing of the right hip. No other focal bone abnormality. 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 Scattered vascular calcifications. IMPRESSION: Mild degenerative changes of the right hip.  No acute findings. Electronically Signed   By: Davina Poke D.O.   On: 09/16/2021 11:12     Assessment and Plan:   Chest pain - hs troponin 18 --> 24 - EKG with ventricular bigeminy, ST depression lateral leads seen in prior tracings - chest pain sounds atypical and associated with coughing, no  further chest pain - do not think his CE represent ACS - will let him recover from [place] and see back in the office to determine need for ischemic evaluation - continue ASA - given DM and history of mild cardiomyopathy, prefer continuation of ACEI   Hypertension - continue amlodipine, lisinopril   Hyperlipidemia 09/16/2021: Cholesterol 104; HDL 30; LDL Cholesterol 62; Triglycerides 62; VLDL 12 - continue statin   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 - carries a history of bradycardia - will avoid BB for now although I did not note bradycardia on telemetry - once recovered from [place], may consider heart monitor to quantify ventricular ectopy --> if high burden, may be a candidate for ablation if HR can't tolerate a BB   Hx of mild cardiomyopathy - echo 2017 with LVEF 45-50%, normalized on echo 02/2021 - continue lisinopril, previously not on BB due to bradycardia - no signs of volume overload - recommend repeat echo, but can be completed outpatient   CKD stage IIIb - sCr 1.99, baseline 1.5-1.7 - per primary   Ascending aortic dilation - measured 41 mm on echo in 02/2021 - control BP - repeat echo pending   COVID-19 - CRP 3.3 - D-dimer 1.00 - management per primary   COPD Past tobacco abuse - per primary, congratulated him on smoking cessation   Prior cocaine use - UDS negative - last positive 5 years ago   Right leg/hip pain - [place] pending to rule out DVT   I have arranged OP cardiology follow up. If he is still in the hospital, can complete an echocardiogram this admission. If discharging home, we can arrange this outpatient.    Risk Assessment/Risk Scores:     HEAR Score (for undifferentiated chest pain):  HEAR Score: 4        For questions or updates, please contact [place] Please consult [email or web address] for contact info under    Signed, Ledora Bottcher, [place]  09/16/2021 2:34 PM

## 2021-09-16 NOTE — ED Notes (Signed)
CBG 77, Pt given some orange juice and graham crackers

## 2021-09-17 ENCOUNTER — Observation Stay (HOSPITAL_COMMUNITY): Payer: Medicare Other

## 2021-09-17 DIAGNOSIS — Z7982 Long term (current) use of aspirin: Secondary | ICD-10-CM | POA: Diagnosis not present

## 2021-09-17 DIAGNOSIS — R079 Chest pain, unspecified: Secondary | ICD-10-CM | POA: Diagnosis not present

## 2021-09-17 DIAGNOSIS — E440 Moderate protein-calorie malnutrition: Secondary | ICD-10-CM | POA: Diagnosis not present

## 2021-09-17 DIAGNOSIS — M47816 Spondylosis without myelopathy or radiculopathy, lumbar region: Secondary | ICD-10-CM | POA: Diagnosis not present

## 2021-09-17 DIAGNOSIS — J449 Chronic obstructive pulmonary disease, unspecified: Secondary | ICD-10-CM | POA: Diagnosis not present

## 2021-09-17 DIAGNOSIS — Z87891 Personal history of nicotine dependence: Secondary | ICD-10-CM | POA: Diagnosis not present

## 2021-09-17 DIAGNOSIS — R778 Other specified abnormalities of plasma proteins: Secondary | ICD-10-CM | POA: Diagnosis not present

## 2021-09-17 DIAGNOSIS — R5381 Other malaise: Secondary | ICD-10-CM | POA: Diagnosis present

## 2021-09-17 DIAGNOSIS — Z79899 Other long term (current) drug therapy: Secondary | ICD-10-CM | POA: Diagnosis not present

## 2021-09-17 DIAGNOSIS — I129 Hypertensive chronic kidney disease with stage 1 through stage 4 chronic kidney disease, or unspecified chronic kidney disease: Secondary | ICD-10-CM | POA: Diagnosis present

## 2021-09-17 DIAGNOSIS — E7800 Pure hypercholesterolemia, unspecified: Secondary | ICD-10-CM | POA: Diagnosis present

## 2021-09-17 DIAGNOSIS — R0789 Other chest pain: Secondary | ICD-10-CM | POA: Diagnosis present

## 2021-09-17 DIAGNOSIS — Z7984 Long term (current) use of oral hypoglycemic drugs: Secondary | ICD-10-CM | POA: Diagnosis not present

## 2021-09-17 DIAGNOSIS — Z955 Presence of coronary angioplasty implant and graft: Secondary | ICD-10-CM | POA: Diagnosis not present

## 2021-09-17 DIAGNOSIS — G40909 Epilepsy, unspecified, not intractable, without status epilepticus: Secondary | ICD-10-CM | POA: Diagnosis present

## 2021-09-17 DIAGNOSIS — Z7951 Long term (current) use of inhaled steroids: Secondary | ICD-10-CM | POA: Diagnosis not present

## 2021-09-17 DIAGNOSIS — I252 Old myocardial infarction: Secondary | ICD-10-CM | POA: Diagnosis not present

## 2021-09-17 DIAGNOSIS — N1832 Chronic kidney disease, stage 3b: Secondary | ICD-10-CM | POA: Diagnosis not present

## 2021-09-17 DIAGNOSIS — I2510 Atherosclerotic heart disease of native coronary artery without angina pectoris: Secondary | ICD-10-CM | POA: Diagnosis present

## 2021-09-17 DIAGNOSIS — I429 Cardiomyopathy, unspecified: Secondary | ICD-10-CM | POA: Diagnosis present

## 2021-09-17 DIAGNOSIS — Z8249 Family history of ischemic heart disease and other diseases of the circulatory system: Secondary | ICD-10-CM | POA: Diagnosis not present

## 2021-09-17 DIAGNOSIS — E1122 Type 2 diabetes mellitus with diabetic chronic kidney disease: Secondary | ICD-10-CM | POA: Diagnosis present

## 2021-09-17 DIAGNOSIS — E875 Hyperkalemia: Secondary | ICD-10-CM | POA: Diagnosis not present

## 2021-09-17 DIAGNOSIS — M25512 Pain in left shoulder: Secondary | ICD-10-CM | POA: Diagnosis not present

## 2021-09-17 DIAGNOSIS — R0600 Dyspnea, unspecified: Secondary | ICD-10-CM | POA: Diagnosis not present

## 2021-09-17 DIAGNOSIS — M109 Gout, unspecified: Secondary | ICD-10-CM | POA: Diagnosis present

## 2021-09-17 DIAGNOSIS — K219 Gastro-esophageal reflux disease without esophagitis: Secondary | ICD-10-CM | POA: Diagnosis present

## 2021-09-17 DIAGNOSIS — M1612 Unilateral primary osteoarthritis, left hip: Secondary | ICD-10-CM | POA: Diagnosis not present

## 2021-09-17 DIAGNOSIS — R531 Weakness: Secondary | ICD-10-CM | POA: Diagnosis not present

## 2021-09-17 DIAGNOSIS — M25551 Pain in right hip: Secondary | ICD-10-CM | POA: Diagnosis not present

## 2021-09-17 DIAGNOSIS — N179 Acute kidney failure, unspecified: Secondary | ICD-10-CM | POA: Diagnosis present

## 2021-09-17 DIAGNOSIS — U071 COVID-19: Secondary | ICD-10-CM | POA: Diagnosis not present

## 2021-09-17 DIAGNOSIS — E860 Dehydration: Secondary | ICD-10-CM | POA: Diagnosis present

## 2021-09-17 LAB — ECHOCARDIOGRAM LIMITED
Area-P 1/2: 3.12 cm2
S' Lateral: 3.8 cm

## 2021-09-17 LAB — CBC WITH DIFFERENTIAL/PLATELET
Abs Immature Granulocytes: 0.02 10*3/uL (ref 0.00–0.07)
Basophils Absolute: 0 10*3/uL (ref 0.0–0.1)
Basophils Relative: 0 %
Eosinophils Absolute: 0.1 10*3/uL (ref 0.0–0.5)
Eosinophils Relative: 1 %
HCT: 36.7 % — ABNORMAL LOW (ref 39.0–52.0)
Hemoglobin: 11.4 g/dL — ABNORMAL LOW (ref 13.0–17.0)
Immature Granulocytes: 0 %
Lymphocytes Relative: 35 %
Lymphs Abs: 2.3 10*3/uL (ref 0.7–4.0)
MCH: 26.9 pg (ref 26.0–34.0)
MCHC: 31.1 g/dL (ref 30.0–36.0)
MCV: 86.6 fL (ref 80.0–100.0)
Monocytes Absolute: 1.3 10*3/uL — ABNORMAL HIGH (ref 0.1–1.0)
Monocytes Relative: 19 %
Neutro Abs: 3 10*3/uL (ref 1.7–7.7)
Neutrophils Relative %: 45 %
Platelets: 188 10*3/uL (ref 150–400)
RBC: 4.24 MIL/uL (ref 4.22–5.81)
RDW: 15.1 % (ref 11.5–15.5)
WBC: 6.7 10*3/uL (ref 4.0–10.5)
nRBC: 0 % (ref 0.0–0.2)

## 2021-09-17 LAB — COMPREHENSIVE METABOLIC PANEL
ALT: 16 U/L (ref 0–44)
AST: 34 U/L (ref 15–41)
Albumin: 2.9 g/dL — ABNORMAL LOW (ref 3.5–5.0)
Alkaline Phosphatase: 69 U/L (ref 38–126)
Anion gap: 6 (ref 5–15)
BUN: 29 mg/dL — ABNORMAL HIGH (ref 8–23)
CO2: 23 mmol/L (ref 22–32)
Calcium: 7.8 mg/dL — ABNORMAL LOW (ref 8.9–10.3)
Chloride: 106 mmol/L (ref 98–111)
Creatinine, Ser: 2.07 mg/dL — ABNORMAL HIGH (ref 0.61–1.24)
GFR, Estimated: 32 mL/min — ABNORMAL LOW (ref >60)
Glucose, Bld: 112 mg/dL — ABNORMAL HIGH (ref 70–99)
Potassium: 4.2 mmol/L (ref 3.5–5.1)
Sodium: 135 mmol/L (ref 135–145)
Total Bilirubin: 0.5 mg/dL (ref 0.3–1.2)
Total Protein: 5.8 g/dL — ABNORMAL LOW (ref 6.5–8.1)

## 2021-09-17 LAB — CBG MONITORING, ED
Glucose-Capillary: 105 mg/dL — ABNORMAL HIGH (ref 70–99)
Glucose-Capillary: 93 mg/dL (ref 70–99)
Glucose-Capillary: 109 mg/dL — ABNORMAL HIGH (ref 70–99)
Glucose-Capillary: 117 mg/dL — ABNORMAL HIGH (ref 70–99)
Glucose-Capillary: 126 mg/dL — ABNORMAL HIGH (ref 70–99)

## 2021-09-17 LAB — GLUCOSE, CAPILLARY
Glucose-Capillary: 103 mg/dL — ABNORMAL HIGH (ref 70–99)
Glucose-Capillary: 117 mg/dL — ABNORMAL HIGH (ref 70–99)

## 2021-09-17 MED ORDER — SODIUM CHLORIDE 0.9 % IV BOLUS
500.0000 mL | Freq: Once | INTRAVENOUS | Status: AC
  Administered 2021-09-17: 10:00:00 500 mL via INTRAVENOUS

## 2021-09-17 MED ORDER — HYDRALAZINE HCL 20 MG/ML IJ SOLN: 10.0000 mg | Freq: Four times a day (QID) | INTRAMUSCULAR | Status: DC | PRN

## 2021-09-17 MED ORDER — SODIUM CHLORIDE 0.9 % IV SOLN: INTRAVENOUS | Status: AC

## 2021-09-17 NOTE — ED Notes (Signed)
Lunch tray delivered to pt at this time.

## 2021-09-17 NOTE — ED Notes (Signed)
Echo at bedside

## 2021-09-17 NOTE — ED Notes (Signed)
Breakfast orders placed 

## 2021-09-17 NOTE — ED Notes (Signed)
Called [facility] again d/t no purple man yet. Per Network engineer on 5W, charge RN not accepting pt until there is a patient discharged d/t monitoring needs.

## 2021-09-17 NOTE — Progress Notes (Signed)
PROGRESS NOTE        PATIENT DETAILS Name: Luis Carter Age: 76 y.o. Sex: male Date of Birth: 1946-03-04 Admit Date: 09/15/2021 Admitting Physician Etta Quill, DO [id number], Christean Grief, MD  Brief Summary: 76 year old with a history of HTN, HLD, CKD stage IIIb, DM-2, COPD, remote history of cocaine use-who presented with retrosternal chest pain x2 weeks, and right hip pain x2 days.  Patient was subsequently admitted to the hospitalist service.  Significant events: 1/22>> admit to [facility].   Subjective: Complains of pain in his legs today.  Appears weak and debilitated-Per nursing staff-requires assistance to even sit up in bed.  Denies any chest pain.  Objective: Vitals: Blood pressure (!) 93/36, pulse (!) 54, temperature 98.9 F (37.2 C), temperature source Oral, resp. rate 17, SpO2 97 %.   Exam: Gen Exam:Alert awake-not in any distress HEENT:atraumatic, normocephalic Chest: B/L clear to auscultation anteriorly CVS:S1S2 regular Abdomen:soft non tender, non distended Extremities:no edema Neurology: Non focal Skin: no rash   Pertinent Labs/Radiology: CBC Latest Ref Rng & Units 09/17/2021 09/16/2021 09/15/2021  WBC 4.0 - 10.5 K/uL 6.7 6.6 6.9  Hemoglobin 13.0 - 17.0 g/dL 11.4(L) 12.0(L) 12.4(L)  Hematocrit 39.0 - 52.0 % 36.7(L) 37.7(L) 38.6(L)  Platelets 150 - 400 K/uL 188 197 207    Lab Results  Component Value Date   NA 135 09/17/2021   K 4.2 09/17/2021   CL 106 09/17/2021   CO2 23 09/17/2021      Assessment/Plan: * Acute chest pain- (present on admission) No further chest pain-awaiting echo-cardiology following.  Troponins nonacute.     Acute right hip pain- (present on admission) X-ray right hip/bilateral lower extremities Dopplers negative.  Do not see any concerning findings on exam.  May be myalgias related to COVID-19-continue supportive care.  Ambulate with therapy services and see how he does.  If pain continues-can  contemplate further imaging studies.  COVID-19 virus infection- (present on admission) Not hypoxic-no PNA on CXR-continue  molnupiravir  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, stage 3b ([place])- (present on admission) AKI likely hemodynamically mediated-due to COVID-19 infection/dehydration.  BP soft today-hold enalapril/amlodipine.  Repeat electrolytes tomorrow-if continues to have worsening AKI-can contemplate further work-up.  Hypertension- (present on admission) BP soft-holding enalapril/amlodipine-see above.  Diabetes mellitus with renal complications ([place])- (present on admission) Last A1c 6.6 on 03/12/2021.  CBG stable with SSI.  Resume metformin prior to discharge.    Recent Labs    09/15/21 2339 09/16/21 0338 09/16/21 0857  GLUCAP 90 77 58    HLD (hyperlipidemia)- (present on admission) Continue statin  COPD (chronic obstructive pulmonary disease) ([place])- (present on admission) Not in exacerbation-continue bronchodilators.  Gout- (present on admission) Does not seem to be in exacerbation-continue allopurinol.  Physical debility- (present on admission) Due to COVID-19 infection-appears weak from baseline-Per nursing staff-needs assistance even to be set up in bed.  Await PT/OT eval.   BMI: Estimated body mass index is 27.11 kg/m as calculated from the following:   Height as of 03/12/21: 5\' 9"  (1.753 m).   Weight as of 03/16/21: 83.3 kg.   DVT Prophylaxis: SQ Lovenox Procedures: None Consults: Cardiology. Code Status:Full code  Family Communication: None at bedside   Disposition Plan: Status is: Observation  The patient remains OBS appropriate and will d/c before 2 midnights.  COVID-19 infection-debility-continues to have leg pain-worsening renal function-starting IVF-awaiting PT/OT to determine  safe disposition.  Not yet stable for discharge.   Diet: Diet Order             Diet Heart Room service appropriate? Yes; Fluid consistency: Thin  Diet effective now                      Antimicrobial agents: Anti-infectives (From admission, onward)    Start     Dose/Rate Route Frequency Ordered Stop   09/15/21 2330  molnupiravir EUA (LAGEVRIO) capsule 800 mg        4 capsule Oral 2 times daily 09/15/21 2322 09/20/21 2159        MEDICATIONS: Scheduled Meds:  allopurinol  200 mg Oral Daily   aspirin EC  81 mg Oral Daily   atorvastatin  20 mg Oral Daily   enoxaparin (LOVENOX) injection  40 mg Subcutaneous Q24H   fluticasone  2 spray Each Nare BID   gabapentin  300 mg Oral TID   insulin aspart  0-9 Units Subcutaneous Q4H   molnupiravir EUA  4 capsule Oral BID   mometasone-formoterol  2 puff Inhalation BID   montelukast  10 mg Oral QHS   pantoprazole  40 mg Oral Daily   Vitamin D (Ergocalciferol)  50,000 Units Oral Q Mon   Continuous Infusions:  sodium chloride     PRN Meds:.acetaminophen, albuterol, hydrALAZINE, ipratropium-albuterol, ondansetron (ZOFRAN) IV   I have personally reviewed following labs and imaging studies  LABORATORY DATA: CBC: Recent Labs  Lab 09/15/21 1745 09/16/21 0039 09/17/21 0340  WBC 6.9 6.6 6.7  NEUTROABS 3.7 3.3 3.0  HGB 12.4* 12.0* 11.4*  HCT 38.6* 37.7* 36.7*  MCV 85.0 85.9 86.6  PLT 207 197 0000000    Basic Metabolic Panel: Recent Labs  Lab 09/15/21 1745 09/16/21 0039 09/17/21 0340  NA 137 134* 135  K 4.2 4.0 4.2  CL 103 106 106  CO2 23 22 23   GLUCOSE 92 95 112*  BUN 21 23 29*  CREATININE 1.91* 1.99* 2.07*  CALCIUM 8.8* 8.1* 7.8*    GFR: CrCl cannot be calculated (Unknown ideal weight.).  Liver Function Tests: Recent Labs  Lab 09/16/21 0039 09/17/21 0340  AST 29 34  ALT 13 16  ALKPHOS 82 69  BILITOT 0.8 0.5  PROT 6.3* 5.8*  ALBUMIN 3.2* 2.9*   No results for input(s): LIPASE, AMYLASE in the last 168 hours. No results for input(s): AMMONIA in the last 168 hours.  Coagulation Profile: No results for input(s): INR, PROTIME in the last 168 hours.  Cardiac Enzymes: No results  for input(s): CKTOTAL, CKMB, CKMBINDEX, TROPONINI in the last 168 hours.  BNP (last 3 results) No results for input(s): PROBNP in the last 8760 hours.  Lipid Profile: Recent Labs    09/16/21 0041  CHOL 104  HDL 30*  LDLCALC 62  TRIG 62  CHOLHDL 3.5    Thyroid Function Tests: No results for input(s): TSH, T4TOTAL, FREET4, T3FREE, THYROIDAB in the last 72 hours.  Anemia Panel: No results for input(s): VITAMINB12, FOLATE, FERRITIN, TIBC, IRON, RETICCTPCT in the last 72 hours.  Urine analysis:    Component Value Date/Time   COLORURINE YELLOW 03/12/2021 0712   APPEARANCEUR HAZY (A) 03/12/2021 0712   LABSPEC 1.012 03/12/2021 0712   PHURINE 5.0 03/12/2021 0712   GLUCOSEU NEGATIVE 03/12/2021 0712   HGBUR MODERATE (A) 03/12/2021 0712   BILIRUBINUR NEGATIVE 03/12/2021 0712   KETONESUR NEGATIVE 03/12/2021 0712   PROTEINUR NEGATIVE 03/12/2021 0712   UROBILINOGEN 1.0 03/21/2013 2316  NITRITE NEGATIVE 03/12/2021 0712   LEUKOCYTESUR LARGE (A) 03/12/2021 0712    Sepsis Labs: Lactic Acid, Venous    Component Value Date/Time   LATICACIDVEN 2.3 (HH) 03/11/2021 2311    MICROBIOLOGY: Recent Results (from the past 240 hour(s))  Resp Panel by RT-PCR (Flu A&B, Covid)     Status: Abnormal   Collection Time: 09/15/21 10:20 PM   Specimen: Nasopharyngeal(NP) swabs in vial transport medium  Result Value Ref Range Status   SARS Coronavirus 2 by RT PCR POSITIVE (A) NEGATIVE Final    Comment: (NOTE) SARS-CoV-2 target nucleic acids are DETECTED.  The SARS-CoV-2 RNA is generally detectable in upper respiratory specimens during the acute phase of infection. Positive results are indicative of the presence of the identified virus, but do not rule out bacterial infection or co-infection with other pathogens not detected by the test. Clinical correlation with patient history and other diagnostic information is necessary to determine patient infection status. The expected result is  Negative.  Fact Sheet for Patients: EntrepreneurPulse.com.au  Fact Sheet for Healthcare Providers: IncredibleEmployment.be  This test is not yet approved or cleared by the [place] FDA and  has been authorized for detection and/or diagnosis of SARS-CoV-2 by FDA under an Emergency Use Authorization (EUA).  This EUA will remain in effect (meaning this test can be used) for the duration of  the COVID-19 declaration under Section 564(b)(1) of the A ct, 21 U.S.C. section 360bbb-3(b)(1), unless the authorization is terminated or revoked sooner.     Influenza A by PCR NEGATIVE NEGATIVE Final   Influenza B by PCR NEGATIVE NEGATIVE Final    Comment: (NOTE) The Xpert Xpress SARS-CoV-2/FLU/RSV plus assay is intended as an aid in the diagnosis of influenza from Nasopharyngeal swab specimens and should not be used as a sole basis for treatment. Nasal washings and aspirates are unacceptable for Xpert Xpress SARS-CoV-2/FLU/RSV testing.  Fact Sheet for Patients: EntrepreneurPulse.com.au  Fact Sheet for Healthcare Providers: IncredibleEmployment.be  This test is not yet approved or cleared by the [place] FDA and has been authorized for detection and/or diagnosis of SARS-CoV-2 by FDA under an Emergency Use Authorization (EUA). This EUA will remain in effect (meaning this test can be used) for the duration of the COVID-19 declaration under Section 564(b)(1) of the Act, 21 U.S.C. section 360bbb-3(b)(1), unless the authorization is terminated or revoked.  Performed at [facility], [place] [address] [place], [place] [address]     RADIOLOGY STUDIES/RESULTS: DG Chest Port 1 View  Result Date: 09/15/2021 CLINICAL DATA:  Chest pain EXAM: PORTABLE CHEST 1 VIEW COMPARISON:  03/13/2021 FINDINGS: Heart and mediastinal contours are within normal limits. No focal opacities or effusions. No acute bony abnormality.  IMPRESSION: No active disease. Electronically Signed   By: Rolm Baptise M.D.   On: 09/15/2021 17:36   DG HIP UNILAT WITH PELVIS 2-3 VIEWS RIGHT  Result Date: 09/16/2021 CLINICAL DATA:  Right hip pain for 2 days EXAM: DG HIP (WITH OR WITHOUT PELVIS) 2-3V RIGHT COMPARISON:  None. FINDINGS: There is no evidence of hip fracture or dislocation. Mild joint space narrowing of the right hip. No other focal bone abnormality. 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 Scattered vascular calcifications. IMPRESSION: Mild degenerative changes of the right hip.  No acute findings. Electronically Signed   By: Davina Poke D.O.   On: 09/16/2021 11:12   VAS [place] LOWER EXTREMITY VENOUS (DVT)  Result Date: 09/17/2021  Lower Venous DVT Study Patient Name:  DEANDRA ABEGGLEN  Klang  Date of Exam:   09/16/2021 Medical Rec #: [id number]           Accession #:    [id number] Date of Birth: 1945-10-05            Patient Gender: M Patient Age:   32 years Exam Location:  [facility] Procedure:      VAS [place] LOWER EXTREMITY VENOUS (DVT) Referring Phys: Oren Binet --------------------------------------------------------------------------------  Indications: Swelling.  Risk Factors: COVID 19 positive. Comparison Study: No prior studies. Performing Technologist: Oliver Hum RVT  Examination Guidelines: A complete evaluation includes B-mode imaging, spectral Doppler, color Doppler, and power Doppler as needed of all accessible portions of each vessel. Bilateral testing is considered an integral part of a complete examination. Limited examinations for reoccurring indications may be performed as noted. The reflux portion of the exam is performed with the patient in reverse Trendelenburg.  +---------+---------------+---------+-----------+----------+--------------+  RIGHT     Compressibility Phasicity Spontaneity Properties Thrombus Aging  +---------+---------------+---------+-----------+----------+--------------+   CFV       Full            Yes       Yes                                    +---------+---------------+---------+-----------+----------+--------------+  SFJ       Full                                                             +---------+---------------+---------+-----------+----------+--------------+  FV Prox   Full                                                             +---------+---------------+---------+-----------+----------+--------------+  FV Mid    Full                                                             +---------+---------------+---------+-----------+----------+--------------+  FV Distal Full                                                             +---------+---------------+---------+-----------+----------+--------------+  PFV       Full                                                             +---------+---------------+---------+-----------+----------+--------------+  POP       Full            Yes  Yes                                    +---------+---------------+---------+-----------+----------+--------------+  PTV       Full                                                             +---------+---------------+---------+-----------+----------+--------------+  PERO      Full                                                             +---------+---------------+---------+-----------+----------+--------------+   +---------+---------------+---------+-----------+----------+--------------+  LEFT      Compressibility Phasicity Spontaneity Properties Thrombus Aging  +---------+---------------+---------+-----------+----------+--------------+  CFV       Full            Yes       Yes                                    +---------+---------------+---------+-----------+----------+--------------+  SFJ       Full                                                             +---------+---------------+---------+-----------+----------+--------------+  FV Prox   Full                                                              +---------+---------------+---------+-----------+----------+--------------+  FV Mid    Full                                                             +---------+---------------+---------+-----------+----------+--------------+  FV Distal Full                                                             +---------+---------------+---------+-----------+----------+--------------+  PFV       Full                                                             +---------+---------------+---------+-----------+----------+--------------+  POP       Full            Yes       Yes                                    +---------+---------------+---------+-----------+----------+--------------+  PTV       Full                                                             +---------+---------------+---------+-----------+----------+--------------+  PERO      Full                                                             +---------+---------------+---------+-----------+----------+--------------+     Summary: RIGHT: - There is no evidence of deep vein thrombosis in the lower extremity. However, portions of this examination were limited- see technologist comments above.  - No cystic structure found in the popliteal fossa.  LEFT: - There is no evidence of deep vein thrombosis in the lower extremity. However, portions of this examination were limited- see technologist comments above.  - No cystic structure found in the popliteal fossa.  *See table(s) above for measurements and observations. Electronically signed by Orlie Pollen on 09/17/2021 at 6:46:33 AM.    Final    ECHOCARDIOGRAM LIMITED  Result Date: 09/17/2021    ECHOCARDIOGRAM LIMITED REPORT   Patient Name:   MELITON DOUGET Date of Exam: 09/17/2021 Medical Rec #:  [id number]          Height:       69.0 in Accession #:    [id number]         Weight:       183.6 lb Date of Birth:  [birth year]           BSA:          1.992 m Patient Age:    19 years            BP:           117/68 mmHg Patient Gender: M                  HR:           46 bpm. Exam Location:  Inpatient Procedure: Limited Echo Indications:    chest pain  History:        Patient has prior history of Echocardiogram examinations, most                 recent 03/12/2021. COPD; Risk Factors:Diabetes, Dyslipidemia and                 Hypertension.  Sonographer:    Johny Chess RDCS Referring Phys: [id number] [place]  1. Left ventricular ejection fraction, by estimation, is 60 to 65%. The left ventricle has normal function. Left ventricular diastolic parameters are consistent with Grade I diastolic dysfunction (impaired relaxation). Frequent ventricular ectopy.  2. The pericardial effusion is anterior to the right ventricle, but is trivial.  3.  Right ventricular systolic function is normal. The right ventricular size is normal. Tricuspid regurgitation signal is inadequate for assessing PA pressure.  4. The mitral valve is grossly normal. No evidence of mitral valve regurgitation. No evidence of mitral stenosis.  5. The aortic valve was not well visualized. Aortic valve regurgitation is not visualized. Aortic valve sclerosis is present, with no evidence of aortic valve stenosis.  6. Aortic dilatation noted. There is mild dilatation of the ascending aorta, measuring 40 mm. Comparison(s): Ectopy is new from prior study. FINDINGS  Left Ventricle: Frequent ectopy noted. Left ventricular ejection fraction, by estimation, is 60 to 65%. The left ventricle has normal function. There is no left ventricular hypertrophy. Left ventricular diastolic parameters are consistent with Grade I diastolic dysfunction (impaired relaxation). Right Ventricle: The right ventricular size is normal. No increase in right ventricular wall thickness. Right ventricular systolic function is normal. Tricuspid regurgitation signal is inadequate for assessing PA pressure. Pericardium: Trivial pericardial effusion is present.  The pericardial effusion is anterior to the right ventricle. Presence of epicardial fat layer. Mitral Valve: The mitral valve is grossly normal. No evidence of mitral valve stenosis. Tricuspid Valve: The tricuspid valve is normal in structure. Tricuspid valve regurgitation is not demonstrated. No evidence of tricuspid stenosis. Aortic Valve: The aortic valve was not well visualized. Aortic valve regurgitation is not visualized. Aortic valve sclerosis is present, with no evidence of aortic valve stenosis. Pulmonic Valve: The pulmonic valve was not well visualized. Aorta: Aortic dilatation noted. There is mild dilatation of the ascending aorta, measuring 40 mm. IAS/Shunts: No atrial level shunt detected by color flow Doppler. LEFT VENTRICLE PLAX 2D LVIDd:         5.30 cm LVIDs:         3.80 cm LV PW:         0.90 cm LV IVS:        0.90 cm LVOT diam:     2.10 cm LVOT Area:     3.46 cm  LEFT ATRIUM         Index LA diam:    3.40 cm 1.71 cm/m   AORTA Ao Asc diam: 3.80 cm MITRAL VALVE MV Area (PHT): 3.12 cm    SHUNTS MV Decel Time: 243 msec    Systemic Diam: 2.10 cm MV E velocity: 62.90 cm/s MV A velocity: 93.80 cm/s MV E/A ratio:  0.67 Rudean Haskell MD Electronically signed by Rudean Haskell MD Signature Date/Time: 09/17/2021/9:01:49 AM    Final      LOS: 0 days   Oren Binet, MD  [facility]   To contact the attending provider between 7A-7P or the covering provider during after hours 7P-7A, please log into the web site [email or web address] and access using universal [place] password for that web site. If you do not have the password, please call the hospital operator.  09/17/2021, 11:34 AM

## 2021-09-17 NOTE — ED Notes (Signed)
MD notified of BP trending back down. Awaiting response.

## 2021-09-17 NOTE — ED Notes (Addendum)
5W called again to initiate purple man process. Per Diplomatic Services operational officer, still waiting on room with monitor and patients to be discharged.

## 2021-09-17 NOTE — ED Notes (Signed)
5W called to initiate purple man process as pt has had ready bed for 24 minutes. Per Diplomatic Services operational officer on 5W, charge nurse reviewing pt chart now.

## 2021-09-17 NOTE — Assessment & Plan Note (Addendum)
Due to COVID-19 infection-appears weak from baseline-evaluated by PT/OT with recommendations for home health.

## 2021-09-17 NOTE — ED Notes (Signed)
Pt linens changed. Pt provided with additional warm blankets. Denies further needs at this time.

## 2021-09-17 NOTE — Progress Notes (Signed)
Echocardiogram reviewed. LV function is normal. Troponin I is flat and very low. Symptoms have resolved. We plan no further ischemic workup.  [facility] will sign off.   Medication Recommendations:  None Other recommendations (labs, testing, etc):  None Follow up as an outpatient:  None recommended.

## 2021-09-17 NOTE — Progress Notes (Signed)
°  Echocardiogram 2D Echocardiogram has been performed.  Luis Carter 09/17/2021, 8:37 AM

## 2021-09-17 NOTE — Care Management Obs Status (Signed)
MEDICARE OBSERVATION STATUS NOTIFICATION   Patient Details  Name: Luis Carter MRN: [id number] Date of Birth: [birth year]   Medicare Observation Status Notification Given:  Yes    Oletta Cohn, RN 09/17/2021, 9:44 AM

## 2021-09-17 NOTE — ED Notes (Signed)
PT at bedside at this time 

## 2021-09-17 NOTE — Evaluation (Signed)
Physical Therapy Evaluation Patient Details Name: Luis Carter MRN: [id number] DOB: [birth year] Today's Date: 09/17/2021  History of Present Illness  Luis Carter is a 76 y.o. male presenting to the ED with chest pain and R foot pain, L hip pain on PT eval 1/24; ; with medical history significant of HTN, HLD, CKD 3B, DM2, gout, COPD, prior cocaine use.  Clinical Impression   Pt admitted with above diagnosis. Lives at home with sister, who can provide some physical assist and consistent supervision/setup assist, in a multi-level home with steps to enter (no rails); Independent at baseline, walks with a cane, primarily; Presents to PT with L hip pain limiting smoothness of movement, and decr activity tolerance, reporting dizziness with standing activity (no BP drop between sitting and standing); Min assist to steady and use of RW with transfers today; Recommend RW use to offload painful Hip, knees, foot as needed; Anticipate good progress; Pt currently with functional limitations due to the deficits listed below (see PT Problem List). Pt will benefit from skilled PT to increase their independence and safety with mobility to allow discharge to the venue listed below.          Recommendations for follow up therapy are one component of a multi-disciplinary discharge planning process, led by the attending physician.  Recommendations may be updated based on patient status, additional functional criteria and insurance authorization.  Follow Up Recommendations Home health PT    Assistance Recommended at Discharge Intermittent Supervision/Assistance  Patient can return home with the following  A little help with walking and/or transfers;Help with stairs or ramp for entrance    Equipment Recommendations Rolling walker (2 wheels);BSC/3in1 (has a RW; encouraged p to use it consistently)  Recommendations for Other Services  OT consult    Functional Status Assessment Patient has had a recent  decline in their functional status and demonstrates the ability to make significant improvements in function in a reasonable and predictable amount of time.     Precautions / Restrictions Precautions Precautions: Fall Precaution Comments: Anticipate use of RW with significan'ty reduce fall risk      Mobility  Bed Mobility Overal bed mobility: Needs Assistance Bed Mobility: Supine to Sit     Supine to sit: Supervision     General bed mobility comments: Slow moving; Supervision mostly for managing off of high stretcher    Transfers Overall transfer level: Needs assistance Equipment used: Rolling walker (2 wheels) Transfers: Sit to/from Stand, Bed to chair/wheelchair/BSC Sit to Stand: Min guard   Step pivot transfers: Min guard       General transfer comment: Minguard for safety; tended to pull up on RW; will need more cueing and reinforcement of safe hand placement    Ambulation/Gait               General Gait Details: Reported dizziness with standing, opted to hold amb  Stairs            Wheelchair Mobility    Modified Rankin (Stroke Patients Only)       Balance     Sitting balance-Leahy Scale: Good       Standing balance-Leahy Scale: Poor                               Pertinent Vitals/Pain Pain Assessment Pain Assessment: Faces Faces Pain Scale: Hurts little more Pain Location: L hip Pain Descriptors / Indicators: Aching Pain Intervention(s): Monitored during session  Home Living Family/patient expects to be discharged to:: Private residence Living Arrangements: Other relatives (sister) Available Help at Discharge: Available 24 hours/day;Family Type of Home: House Home Access: Stairs to enter Entrance Stairs-Rails: None Entrance Stairs-Number of Steps: 2   Home Layout: Multi-level;Able to live on main level with bedroom/bathroom Home Equipment: Rolling Walker (2 wheels);Cane - single point      Prior Function  Prior Level of Function : Independent/Modified Independent             Mobility Comments: uses a cane primarily for amb       Hand Dominance   Dominant Hand: Right    Extremity/Trunk Assessment   Upper Extremity Assessment Upper Extremity Assessment: Defer to OT evaluation    Lower Extremity Assessment Lower Extremity Assessment: Generalized weakness       Communication   Communication: No difficulties  Cognition Arousal/Alertness: Awake/alert Behavior During Therapy: WFL for tasks assessed/performed Overall Cognitive Status: Within Functional Limits for tasks assessed                                          General Comments General comments (skin integrity, edema, etc.): Session conducted on room air and O2 sats remained stable; dizzy in standing, BP did not drop between sitting and standing    Exercises     Assessment/Plan    PT Assessment Patient needs continued PT services  PT Problem List Decreased strength;Decreased activity tolerance;Decreased balance;Decreased mobility;Decreased knowledge of use of DME;Decreased safety awareness;Pain       PT Treatment Interventions DME instruction;Gait training;Stair training;Functional mobility training;Therapeutic activities;Therapeutic exercise;Balance training;Patient/family education    PT Goals (Current goals can be found in the Care Plan section)  Acute Rehab PT Goals Patient Stated Goal: To get up and walk PT Goal Formulation: With patient Time For Goal Achievement: 10/01/21 Potential to Achieve Goals: Good    Frequency Min 3X/week     Co-evaluation               AM-PAC PT "6 Clicks" Mobility  Outcome Measure Help needed turning from your back to your side while in a flat bed without using bedrails?: None Help needed moving from lying on your back to sitting on the side of a flat bed without using bedrails?: None Help needed moving to and from a bed to a chair (including a  wheelchair)?: A Little Help needed standing up from a chair using your arms (e.g., wheelchair or bedside chair)?: A Little Help needed to walk in hospital room?: A Little Help needed climbing 3-5 steps with a railing? : A Lot 6 Click Score: 19    End of Session   Activity Tolerance: Patient tolerated treatment well Patient left: in chair;with call bell/phone within reach Nurse Communication: Mobility status PT Visit Diagnosis: Unsteadiness on feet (R26.81);Other abnormalities of gait and mobility (R26.89);Pain Pain - Right/Left: Left Pain - part of body: Hip    Time: 1310-1340 PT Time Calculation (min) (ACUTE ONLY): 30 min   Charges:   PT Evaluation $PT Eval Moderate Complexity: 1 Mod PT Treatments $Therapeutic Activity: 8-22 mins        Roney Marion, PT  Acute Rehabilitation Services Pager [phone] Office [phone]   Colletta [place] 09/17/2021, 4:01 PM

## 2021-09-17 NOTE — ED Notes (Signed)
Pt assisted back to bed from chair. Pt used walker, tolerated well.

## 2021-09-17 NOTE — ED Notes (Signed)
MD notified of BP 84/57 MAP 65. See new orders. IVF bolus hung.

## 2021-09-18 ENCOUNTER — Inpatient Hospital Stay (HOSPITAL_COMMUNITY): Payer: Medicare Other

## 2021-09-18 LAB — CBC WITH DIFFERENTIAL/PLATELET
Abs Immature Granulocytes: 0.01 10*3/uL (ref 0.00–0.07)
Basophils Absolute: 0 10*3/uL (ref 0.0–0.1)
Basophils Relative: 0 %
Eosinophils Absolute: 0.2 10*3/uL (ref 0.0–0.5)
Eosinophils Relative: 4 %
HCT: 34.2 % — ABNORMAL LOW (ref 39.0–52.0)
Hemoglobin: 11 g/dL — ABNORMAL LOW (ref 13.0–17.0)
Immature Granulocytes: 0 %
Lymphocytes Relative: 43 %
Lymphs Abs: 1.9 10*3/uL (ref 0.7–4.0)
MCH: 27.2 pg (ref 26.0–34.0)
MCHC: 32.2 g/dL (ref 30.0–36.0)
MCV: 84.7 fL (ref 80.0–100.0)
Monocytes Absolute: 0.7 10*3/uL (ref 0.1–1.0)
Monocytes Relative: 16 %
Neutro Abs: 1.7 10*3/uL (ref 1.7–7.7)
Neutrophils Relative %: 37 %
Platelets: 191 10*3/uL (ref 150–400)
RBC: 4.04 MIL/uL — ABNORMAL LOW (ref 4.22–5.81)
RDW: 14.8 % (ref 11.5–15.5)
WBC: 4.5 10*3/uL (ref 4.0–10.5)
nRBC: 0 % (ref 0.0–0.2)

## 2021-09-18 LAB — GLUCOSE, CAPILLARY
Glucose-Capillary: 100 mg/dL — ABNORMAL HIGH (ref 70–99)
Glucose-Capillary: 105 mg/dL — ABNORMAL HIGH (ref 70–99)
Glucose-Capillary: 139 mg/dL — ABNORMAL HIGH (ref 70–99)
Glucose-Capillary: 142 mg/dL — ABNORMAL HIGH (ref 70–99)
Glucose-Capillary: 89 mg/dL (ref 70–99)
Glucose-Capillary: 99 mg/dL (ref 70–99)

## 2021-09-18 LAB — COMPREHENSIVE METABOLIC PANEL
ALT: 16 U/L (ref 0–44)
AST: 33 U/L (ref 15–41)
Albumin: 2.9 g/dL — ABNORMAL LOW (ref 3.5–5.0)
Alkaline Phosphatase: 64 U/L (ref 38–126)
Anion gap: 8 (ref 5–15)
BUN: 29 mg/dL — ABNORMAL HIGH (ref 8–23)
CO2: 20 mmol/L — ABNORMAL LOW (ref 22–32)
Calcium: 8 mg/dL — ABNORMAL LOW (ref 8.9–10.3)
Chloride: 105 mmol/L (ref 98–111)
Creatinine, Ser: 1.77 mg/dL — ABNORMAL HIGH (ref 0.61–1.24)
GFR, Estimated: 39 mL/min — ABNORMAL LOW (ref >60)
Glucose, Bld: 107 mg/dL — ABNORMAL HIGH (ref 70–99)
Potassium: 4.1 mmol/L (ref 3.5–5.1)
Sodium: 133 mmol/L — ABNORMAL LOW (ref 135–145)
Total Bilirubin: 0.5 mg/dL (ref 0.3–1.2)
Total Protein: 5.8 g/dL — ABNORMAL LOW (ref 6.5–8.1)

## 2021-09-18 NOTE — Progress Notes (Signed)
Physical Therapy Treatment Patient Details Name: Luis Carter MRN: [id number] DOB: Dec 14, 1945 Today's Date: 09/18/2021   History of Present Illness Luis Carter is a 76 y.o. male presenting to the ED with chest pain and R foot pain; with medical history significant of HTN, HLD, CKD 3B, DM2, gout, COPD, prior cocaine use.    PT Comments    Pt remains limited by fatigue at this time. Pt is able to ambulate for limited household distances without physical assistance. Pt requires significantly increased time to perform 5x sit to stand, indicating an increased risk for falls. Pt will likely need support at home to be successful. PT and pt discuss utilizing bedside commode and limiting longer ambulation distances when home alone until he is able to progress with HHPT.   Recommendations for follow up therapy are one component of a multi-disciplinary discharge planning process, led by the attending physician.  Recommendations may be updated based on patient status, additional functional criteria and insurance authorization.  Follow Up Recommendations  Home health PT     Assistance Recommended at Discharge Intermittent Supervision/Assistance  Patient can return home with the following A little help with walking and/or transfers;Help with stairs or ramp for entrance   Equipment Recommendations  BSC/3in1 (pt already owns a RW)    Recommendations for Other Services       Precautions / Restrictions Precautions Precautions: Fall Restrictions Weight Bearing Restrictions: No     Mobility  Bed Mobility                    Transfers Overall transfer level: Needs assistance Equipment used: Rolling walker (2 wheels) Transfers: Sit to/from Stand Sit to Stand: Supervision           General transfer comment: pt completes 5x sit to stand requiring >2 minutes to perform    Ambulation/Gait Ambulation/Gait assistance: Min guard Gait Distance (Feet): 40 Feet Assistive  device: Rolling walker (2 wheels) Gait Pattern/deviations: Step-to pattern Gait velocity: reduced Gait velocity interpretation: <1.8 ft/sec, indicate of risk for recurrent falls   General Gait Details: pt with slowed step-to gait, reduced stance time on RLE   Stairs             Wheelchair Mobility    Modified Rankin (Stroke Patients Only)       Balance Overall balance assessment: Needs assistance Sitting-balance support: No upper extremity supported, Feet supported Sitting balance-Leahy Scale: Good     Standing balance support: Bilateral upper extremity supported, Reliant on assistive device for balance, Single extremity supported Standing balance-Leahy Scale: Poor                              Cognition Arousal/Alertness: Awake/alert Behavior During Therapy: WFL for tasks assessed/performed Overall Cognitive Status: Within Functional Limits for tasks assessed                                          Exercises      General Comments General comments (skin integrity, edema, etc.): VSS on RA, pt reports SOB upon completion of ambulation however SpO2 in 90s. Pt reports dizziness with 5x sit to stand, symptoms resolve abruptly with sitting      Pertinent Vitals/Pain Pain Assessment Pain Assessment: Faces Faces Pain Scale: Hurts little more Pain Location: knees Pain Descriptors / Indicators: Grimacing Pain Intervention(s): Monitored  during session    Home Living                          Prior Function            PT Goals (current goals can now be found in the care plan section) Acute Rehab PT Goals Patient Stated Goal: To get up and walk Progress towards PT goals: Progressing toward goals    Frequency    Min 3X/week      PT Plan Current plan remains appropriate    Co-evaluation              AM-PAC PT "6 Clicks" Mobility   Outcome Measure  Help needed turning from your back to your side while in a  flat bed without using bedrails?: None Help needed moving from lying on your back to sitting on the side of a flat bed without using bedrails?: None Help needed moving to and from a bed to a chair (including a wheelchair)?: A Little Help needed standing up from a chair using your arms (e.g., wheelchair or bedside chair)?: A Little Help needed to walk in hospital room?: A Little Help needed climbing 3-5 steps with a railing? : A Lot 6 Click Score: 19    End of Session   Activity Tolerance: Patient tolerated treatment well Patient left: in chair;with call bell/phone within reach Nurse Communication: Mobility status PT Visit Diagnosis: Unsteadiness on feet (R26.81);Other abnormalities of gait and mobility (R26.89);Pain Pain - Right/Left:  (bilateral) Pain - part of body: Knee     Time: 1101-1120 PT Time Calculation (min) (ACUTE ONLY): 19 min  Charges:  $Therapeutic Activity: 8-22 mins                     Arlyss Gandy, PT, DPT Acute Rehabilitation Pager: [phone] Office [phone]    Arlyss Gandy 09/18/2021, 12:06 PM

## 2021-09-18 NOTE — TOC Transition Note (Signed)
Transition of Care [facility]) - CM/SW Discharge Note   Patient Details  Name: Luis Carter MRN: [id number] Date of Birth: [birth year]  Transition of Care [facility]) CM/SW Contact:  Lawerance Sabal, RN Phone Number: 09/18/2021, 2:06 PM   Clinical Narrative:    Unable to reach patient x2. Spoke w his sister. Patient and sister share a home. She states her house is small and she would prefer to go outpatient PT like he had done in the past. Referral made to [facility]. 3/1 ordered and requested to be delivered to room today for DC tomorrow. Sister states that she can provide transportation home tomorrow afternoon.     Final next level of care: Home/Self Care Barriers to Discharge: No Barriers Identified   Patient Goals and CMS Choice Patient states their goals for this hospitalization and ongoing recovery are:: to return home CMS Medicare.gov Compare Post Acute Care list provided to:: Other (Comment Required) Choice offered to / list presented to : Sibling  Discharge Placement                       Discharge Plan and Services                DME Arranged: 3-N-1 DME Agency: [employer] Date DME Agency Contacted: 09/18/21 Time DME Agency Contacted: [id number] Representative spoke with at DME Agency: Velna Hatchet            Social Determinants of Health (SDOH) Interventions     Readmission Risk Interventions No flowsheet data found.

## 2021-09-18 NOTE — Evaluation (Signed)
Occupational Therapy Evaluation Patient Details Name: Luis Carter MRN: [id number] DOB: 1946-02-04 Today's Date: 09/18/2021   History of Present Illness Luis Carter is a 76 y.o. male presenting to the ED with chest pain and R foot pain; with medical history significant of HTN, HLD, CKD 3B, DM2, gout, COPD, prior cocaine use.   Clinical Impression   Pt presents with decline in function and safety with ADLs and ADL mobility with impaired strength, balance and endurance. PTA pt lived at home with his sister and was Ind with ADLs/selfcare and used a cane for mobility. Pt currently requires min guard A with UB ADLs and grooming standing, min A with LB ADLs, min A with toileting and min - min guard A with mobility using RW. Pt would benefit from acute OT services to address impairments to maximize level of function and safety     Recommendations for follow up therapy are one component of a multi-disciplinary discharge planning process, led by the attending physician.  Recommendations may be updated based on patient status, additional functional criteria and insurance authorization.   Follow Up Recommendations  Home health OT    Assistance Recommended at Discharge Intermittent Supervision/Assistance  Patient can return home with the following A little help with walking and/or transfers;A little help with bathing/dressing/bathroom;Help with stairs or ramp for entrance;Assist for transportation    Functional Status Assessment  Patient has had a recent decline in their functional status and demonstrates the ability to make significant improvements in function in a reasonable and predictable amount of time.  Equipment Recommendations  BSC/3in1    Recommendations for Other Services       Precautions / Restrictions Precautions Precautions: Fall Restrictions Weight Bearing Restrictions: No      Mobility Bed Mobility               General bed mobility comments: pt in recline  rupon arrival    Transfers Overall transfer level: Needs assistance   Transfers: Sit to/from Stand Sit to Stand: Min guard     Step pivot transfers: Min guard            Balance Overall balance assessment: Needs assistance Sitting-balance support: No upper extremity supported, Feet supported Sitting balance-Leahy Scale: Good     Standing balance support: Bilateral upper extremity supported, Reliant on assistive device for balance, Single extremity supported Standing balance-Leahy Scale: Poor                             ADL either performed or assessed with clinical judgement   ADL Overall ADL's : Needs assistance/impaired Eating/Feeding: Independent;Sitting   Grooming: Wash/dry hands;Wash/dry face;Min guard;Standing   Upper Body Bathing: Set up;Supervision/ safety;Sitting   Lower Body Bathing: Minimal assistance   Upper Body Dressing : Set up;Supervision/safety;Sitting   Lower Body Dressing: Minimal assistance   Toilet Transfer: Min guard;Ambulation;Rolling walker (2 wheels);Cueing for safety;BSC/3in1   Toileting- Clothing Manipulation and Hygiene: Minimal assistance;Sit to/from stand       Functional mobility during ADLs: Min guard;Rolling walker (2 wheels);Cueing for safety       Vision Baseline Vision/History: 1 Wears glasses Ability to See in Adequate Light: 0 Adequate Patient Visual Report: No change from baseline       Perception     Praxis      Pertinent Vitals/Pain Pain Assessment Pain Assessment: Faces Faces Pain Scale: Hurts a little bit Pain Location: B knees Pain Descriptors / Indicators: Aching, Grimacing Pain  Intervention(s): Monitored during session, Repositioned     Hand Dominance Right   Extremity/Trunk Assessment Upper Extremity Assessment Upper Extremity Assessment: Generalized weakness   Lower Extremity Assessment Lower Extremity Assessment: Defer to PT evaluation       Communication  Communication Communication: No difficulties   Cognition Arousal/Alertness: Awake/alert Behavior During Therapy: WFL for tasks assessed/performed Overall Cognitive Status: Within Functional Limits for tasks assessed                                       General Comments  VSS on RA, pt reports SOB upon completion of ambulation however SpO2 in 90s. Pt reports dizziness with 5x sit to stand, symptoms resolve abruptly with sitting    Exercises     Shoulder Instructions      Home Living Family/patient expects to be discharged to:: Private residence Living Arrangements: Other relatives (sister) Available Help at Discharge: Available 24 hours/day;Family Type of Home: House Home Access: Stairs to enter [employer] of Steps: 2 Entrance Stairs-Rails: None Home Layout: Multi-level;Able to live on main level with bedroom/bathroom     Bathroom Shower/Tub: Tub/shower unit;Curtain   Bathroom Toilet: Standard     Home Equipment: Conservation officer, nature (2 wheels);Cane - single point          Prior Functioning/Environment Prior Level of Function : Independent/Modified Independent             Mobility Comments: uses a cane primarily for amb ADLs Comments: Ind with ADLs/selfcare, IADLs        OT Problem List: Decreased strength;Impaired balance (sitting and/or standing);Decreased knowledge of use of DME or AE;Decreased activity tolerance;Pain      OT Treatment/Interventions: Self-care/ADL training;Patient/family education;Balance training;Therapeutic activities;DME and/or AE instruction    OT Goals(Current goals can be found in the care plan section) Acute Rehab OT Goals Patient Stated Goal: go home OT Goal Formulation: With patient Time For Goal Achievement: 10/02/21 ADL Goals Pt Will Perform Grooming: with supervision;with set-up;standing Pt Will Perform Upper Body Bathing: with set-up;with modified independence Pt Will Perform Lower Body Bathing: with  min guard assist;with supervision;sit to/from stand Pt Will Perform Upper Body Dressing: with set-up;with modified independence Pt Will Perform Lower Body Dressing: with min guard assist;with supervision;sit to/from stand Pt Will Transfer to Toilet: with supervision;ambulating Pt Will Perform Toileting - Clothing Manipulation and hygiene: with min guard assist;with supervision;sit to/from stand Pt Will Perform Tub/Shower Transfer: with min guard assist;with supervision;ambulating;rolling walker;3 in 1  OT Frequency: Min 2X/week    Co-evaluation              AM-PAC OT "6 Clicks" Daily Activity     Outcome Measure Help from another person eating meals?: None Help from another person taking care of personal grooming?: A Little Help from another person toileting, which includes using toliet, bedpan, or urinal?: A Little Help from another person bathing (including washing, rinsing, drying)?: A Little Help from another person to put on and taking off regular upper body clothing?: A Little Help from another person to put on and taking off regular lower body clothing?: A Little 6 Click Score: 19   End of Session Equipment Utilized During Treatment: Gait belt;Rolling walker (2 wheels);Other (comment) (BSC)  Activity Tolerance: Patient tolerated treatment well Patient left: in chair;with call bell/phone within reach  OT Visit Diagnosis: Unsteadiness on feet (R26.81);Other abnormalities of gait and mobility (R26.89);Muscle weakness (generalized) (M62.81)  Time: [id number] OT Time Calculation (min): 27 min Charges:  OT General Charges $OT Visit: 1 Visit OT Evaluation $OT Eval Moderate Complexity: 1 Mod OT Treatments $Therapeutic Activity: 8-22 mins    Britt Bottom 09/18/2021, 2:37 PM

## 2021-09-18 NOTE — Progress Notes (Signed)
PROGRESS NOTE        PATIENT DETAILS Name: Luis Carter Age: 76 y.o. Sex: male Date of Birth: [birth year] Admit Date: 09/15/2021 Admitting Physician Evalee Mutton Kristeen Mans, MD [id number], Christean Grief, MD  Brief Summary: 76 year old with a history of HTN, HLD, CKD stage IIIb, DM-2, COPD, remote history of cocaine use-who presented with retrosternal chest pain x2 weeks, and right hip pain x2 days.  Patient was subsequently admitted to the hospitalist service.  Significant events: 1/22>> admit to [facility].   Subjective: Continues to have pain in his hips/knees.  But overall feels better.  Sitting at bedside chair.  Objective: Vitals: Blood pressure 114/65, pulse 63, temperature 97.6 F (36.4 C), temperature source Oral, resp. rate 16, SpO2 96 %.   Exam: Gen Exam:Alert awake-not in any distress HEENT:atraumatic, normocephalic Chest: B/L clear to auscultation anteriorly CVS:S1S2 regular Abdomen:soft non tender, non distended Extremities:no edema Neurology: Non focal Skin: no rash   Pertinent Labs/Radiology: CBC Latest Ref Rng & Units 09/18/2021 09/17/2021 09/16/2021  WBC 4.0 - 10.5 K/uL 4.5 6.7 6.6  Hemoglobin 13.0 - 17.0 g/dL 11.0(L) 11.4(L) 12.0(L)  Hematocrit 39.0 - 52.0 % 34.2(L) 36.7(L) 37.7(L)  Platelets 150 - 400 K/uL 191 188 197    Lab Results  Component Value Date   NA 133 (L) 09/18/2021   K 4.1 09/18/2021   CL 105 09/18/2021   CO2 20 (L) 09/18/2021      Assessment/Plan: * Acute chest pain- (present on admission) No further chest pain-stable EF on echo-no further recommendations from cardiology.     Acute right hip pain- (present on admission) X-ray of bilateral hips negative for any fractures-Dopplers negative for DVT.  No concerning findings on exam.  Continues to complain of pain in hips/knee areas.  Suspect has myalgias/arthritis related to COVID-19 infection.  Continue supportive care-we will arrange for home health services on  discharge.   COVID-19 virus infection- (present on admission) Not hypoxic-no PNA on CXR-continue  molnupiravir  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, stage 3b ([place])- (present on admission) AKI likely hemodynamically mediated-due to COVID-19 infection/dehydration.  Slowly improving-stop IVF.  Hypertension- (present on admission) BP better-continue to hold enalapril and amlodipine.  Resume over the next few days when able.    Diabetes mellitus with renal complications ([place])- (present on admission) Last A1c 6.6 on 03/12/2021.  CBG stable with SSI.  Resume metformin prior to discharge.    Recent Labs    09/17/21 2355 09/18/21 0356 09/18/21 0811  GLUCAP 103* 99 89    HLD (hyperlipidemia)- (present on admission) Continue statin  COPD (chronic obstructive pulmonary disease) (HCC)- (present on admission) Not in exacerbation-continue bronchodilators.  Gout- (present on admission) Does not seem to be in exacerbation-continue allopurinol.  Physical debility- (present on admission) Due to COVID-19 infection-appears weak from baseline-evaluated by PT/OT with recommendations for home health.   BMI: Estimated body mass index is 27.11 kg/m as calculated from the following:   Height as of 03/12/21: 5\' 9"  (1.753 m).   Weight as of 03/16/21: 83.3 kg.   DVT Prophylaxis: SQ Lovenox Procedures: None Consults: Cardiology. Code Status:Full code  Family Communication: Sister-Louise Hargove-[phone]-updated over the phone on 1/25.   Disposition Plan: Status is: Observation  The patient remains OBS appropriate and will d/c before 2 midnights.  COVID-19 infection-debility-slowly improving-renal function improving-likely discharge on 1/26.   Diet: Diet Order  Diet Heart Room service appropriate? Yes; Fluid consistency: Thin  Diet effective now                     Antimicrobial agents: Anti-infectives (From admission, onward)    Start     Dose/Rate Route Frequency  Ordered Stop   09/15/21 2330  molnupiravir EUA (LAGEVRIO) capsule 800 mg        4 capsule Oral 2 times daily 09/15/21 2322 09/20/21 2159        MEDICATIONS: Scheduled Meds:  allopurinol  200 mg Oral Daily   aspirin EC  81 mg Oral Daily   atorvastatin  20 mg Oral Daily   enoxaparin (LOVENOX) injection  40 mg Subcutaneous Q24H   fluticasone  2 spray Each Nare BID   gabapentin  300 mg Oral TID   insulin aspart  0-9 Units Subcutaneous Q4H   molnupiravir EUA  4 capsule Oral BID   mometasone-formoterol  2 puff Inhalation BID   montelukast  10 mg Oral QHS   pantoprazole  40 mg Oral Daily   Vitamin D (Ergocalciferol)  50,000 Units Oral Q Mon   Continuous Infusions:   PRN Meds:.acetaminophen, albuterol, hydrALAZINE, ipratropium-albuterol, ondansetron (ZOFRAN) IV   I have personally reviewed following labs and imaging studies  LABORATORY DATA: CBC: Recent Labs  Lab 09/15/21 1745 09/16/21 0039 09/17/21 0340 09/18/21 0121  WBC 6.9 6.6 6.7 4.5  NEUTROABS 3.7 3.3 3.0 1.7  HGB 12.4* 12.0* 11.4* 11.0*  HCT 38.6* 37.7* 36.7* 34.2*  MCV 85.0 85.9 86.6 84.7  PLT 207 197 188 [id number]    Basic Metabolic Panel: Recent Labs  Lab 09/15/21 1745 09/16/21 0039 09/17/21 0340 09/18/21 0121  NA 137 134* 135 133*  K 4.2 4.0 4.2 4.1  CL 103 106 106 105  CO2 23 22 23  20*  GLUCOSE 92 95 112* 107*  BUN 21 23 29* 29*  CREATININE 1.91* 1.99* 2.07* 1.77*  CALCIUM 8.8* 8.1* 7.8* 8.0*    GFR: CrCl cannot be calculated (Unknown ideal weight.).  Liver Function Tests: Recent Labs  Lab 09/16/21 0039 09/17/21 0340 09/18/21 0121  AST 29 34 33  ALT 13 16 16   ALKPHOS 82 69 64  BILITOT 0.8 0.5 0.5  PROT 6.3* 5.8* 5.8*  ALBUMIN 3.2* 2.9* 2.9*   No results for input(s): LIPASE, AMYLASE in the last 168 hours. No results for input(s): AMMONIA in the last 168 hours.  Coagulation Profile: No results for input(s): INR, PROTIME in the last 168 hours.  Cardiac Enzymes: No results for  input(s): CKTOTAL, CKMB, CKMBINDEX, TROPONINI in the last 168 hours.  BNP (last 3 results) No results for input(s): PROBNP in the last 8760 hours.  Lipid Profile: Recent Labs    09/16/21 0041  CHOL 104  HDL 30*  LDLCALC 62  TRIG 62  CHOLHDL 3.5    Thyroid Function Tests: No results for input(s): TSH, T4TOTAL, FREET4, T3FREE, THYROIDAB in the last 72 hours.  Anemia Panel: No results for input(s): VITAMINB12, FOLATE, FERRITIN, TIBC, IRON, RETICCTPCT in the last 72 hours.  Urine analysis:    Component Value Date/Time   COLORURINE YELLOW 03/12/2021 0712   APPEARANCEUR HAZY (A) 03/12/2021 0712   LABSPEC 1.012 03/12/2021 0712   PHURINE 5.0 03/12/2021 0712   GLUCOSEU NEGATIVE 03/12/2021 0712   HGBUR MODERATE (A) 03/12/2021 0712   BILIRUBINUR NEGATIVE 03/12/2021 0712   KETONESUR NEGATIVE 03/12/2021 0712   PROTEINUR NEGATIVE 03/12/2021 0712   UROBILINOGEN 1.0 03/21/2013 2316   NITRITE NEGATIVE  03/12/2021 0712   LEUKOCYTESUR LARGE (A) 03/12/2021 0712    Sepsis Labs: Lactic Acid, Venous    Component Value Date/Time   LATICACIDVEN 2.3 (HH) 03/11/2021 2311    MICROBIOLOGY: Recent Results (from the past 240 hour(s))  Resp Panel by RT-PCR (Flu A&B, Covid)     Status: Abnormal   Collection Time: 09/15/21 10:20 PM   Specimen: Nasopharyngeal(NP) swabs in vial transport medium  Result Value Ref Range Status   SARS Coronavirus 2 by RT PCR POSITIVE (A) NEGATIVE Final    Comment: (NOTE) SARS-CoV-2 target nucleic acids are DETECTED.  The SARS-CoV-2 RNA is generally detectable in upper respiratory specimens during the acute phase of infection. Positive results are indicative of the presence of the identified virus, but do not rule out bacterial infection or co-infection with other pathogens not detected by the test. Clinical correlation with patient history and other diagnostic information is necessary to determine patient infection status. The expected result is  Negative.  Fact Sheet for Patients: EntrepreneurPulse.com.au  Fact Sheet for Healthcare Providers: IncredibleEmployment.be  This test is not yet approved or cleared by the [place] FDA and  has been authorized for detection and/or diagnosis of SARS-CoV-2 by FDA under an Emergency Use Authorization (EUA).  This EUA will remain in effect (meaning this test can be used) for the duration of  the COVID-19 declaration under Section 564(b)(1) of the A ct, 21 U.S.C. section 360bbb-3(b)(1), unless the authorization is terminated or revoked sooner.     Influenza A by PCR NEGATIVE NEGATIVE Final   Influenza B by PCR NEGATIVE NEGATIVE Final    Comment: (NOTE) The Xpert Xpress SARS-CoV-2/FLU/RSV plus assay is intended as an aid in the diagnosis of influenza from Nasopharyngeal swab specimens and should not be used as a sole basis for treatment. Nasal washings and aspirates are unacceptable for Xpert Xpress SARS-CoV-2/FLU/RSV testing.  Fact Sheet for Patients: EntrepreneurPulse.com.au  Fact Sheet for Healthcare Providers: IncredibleEmployment.be  This test is not yet approved or cleared by the [place] FDA and has been authorized for detection and/or diagnosis of SARS-CoV-2 by FDA under an Emergency Use Authorization (EUA). This EUA will remain in effect (meaning this test can be used) for the duration of the COVID-19 declaration under Section 564(b)(1) of the Act, 21 U.S.C. section 360bbb-3(b)(1), unless the authorization is terminated or revoked.  Performed at [facility], [place] [address], [place], [place] [address]     RADIOLOGY STUDIES/RESULTS: DG HIP UNILAT WITH PELVIS 2-3 VIEWS LEFT  Result Date: 09/18/2021 CLINICAL DATA:  A 76 year old male presents with acute hip pain, no history of reported injury. EXAM: DG HIP (WITH OR WITHOUT PELVIS) 2-3V LEFT COMPARISON:  Contralateral hip on September 16, 2021 FINDINGS: No signs of fracture or dislocation. Mild degenerative changes about the LEFT and RIGHT hip. Degenerative changes in the lumbar spine. Soft tissues are grossly normal. IMPRESSION: Mild degenerative changes without acute osseous abnormality. Electronically Signed   By: Zetta Bills M.D.   On: 09/18/2021 09:25   VAS [place] LOWER EXTREMITY VENOUS (DVT)  Result Date: 09/17/2021  Lower Venous DVT Study Patient Name:  JUNAH FRAGOZA  Date of Exam:   09/16/2021 Medical Rec #: [id number]           Accession #:    [id number] Date of Birth: [birth year]            Patient Gender: M Patient Age:   39 years Exam Location:  [facility] Procedure:  VAS [place] LOWER EXTREMITY VENOUS (DVT) Referring Phys: Oren Binet --------------------------------------------------------------------------------  Indications: Swelling.  Risk Factors: COVID 19 positive. Comparison Study: No prior studies. Performing Technologist: Oliver Hum RVT  Examination Guidelines: A complete evaluation includes B-mode imaging, spectral Doppler, color Doppler, and power Doppler as needed of all accessible portions of each vessel. Bilateral testing is considered an integral part of a complete examination. Limited examinations for reoccurring indications may be performed as noted. The reflux portion of the exam is performed with the patient in reverse Trendelenburg.  +---------+---------------+---------+-----------+----------+--------------+  RIGHT     Compressibility Phasicity Spontaneity Properties Thrombus Aging  +---------+---------------+---------+-----------+----------+--------------+  CFV       Full            Yes       Yes                                    +---------+---------------+---------+-----------+----------+--------------+  SFJ       Full                                                             +---------+---------------+---------+-----------+----------+--------------+  FV Prox   Full                                                              +---------+---------------+---------+-----------+----------+--------------+  FV Mid    Full                                                             +---------+---------------+---------+-----------+----------+--------------+  FV Distal Full                                                             +---------+---------------+---------+-----------+----------+--------------+  PFV       Full                                                             +---------+---------------+---------+-----------+----------+--------------+  POP       Full            Yes       Yes                                    +---------+---------------+---------+-----------+----------+--------------+  PTV       Full                                                             +---------+---------------+---------+-----------+----------+--------------+  PERO      Full                                                             +---------+---------------+---------+-----------+----------+--------------+   +---------+---------------+---------+-----------+----------+--------------+  LEFT      Compressibility Phasicity Spontaneity Properties Thrombus Aging  +---------+---------------+---------+-----------+----------+--------------+  CFV       Full            Yes       Yes                                    +---------+---------------+---------+-----------+----------+--------------+  SFJ       Full                                                             +---------+---------------+---------+-----------+----------+--------------+  FV Prox   Full                                                             +---------+---------------+---------+-----------+----------+--------------+  FV Mid    Full                                                             +---------+---------------+---------+-----------+----------+--------------+  FV Distal Full                                                              +---------+---------------+---------+-----------+----------+--------------+  PFV       Full                                                             +---------+---------------+---------+-----------+----------+--------------+  POP       Full            Yes       Yes                                    +---------+---------------+---------+-----------+----------+--------------+  PTV       Full                                                             +---------+---------------+---------+-----------+----------+--------------+  PERO      Full                                                             +---------+---------------+---------+-----------+----------+--------------+     Summary: RIGHT: - There is no evidence of deep vein thrombosis in the lower extremity. However, portions of this examination were limited- see technologist comments above.  - No cystic structure found in the popliteal fossa.  LEFT: - There is no evidence of deep vein thrombosis in the lower extremity. However, portions of this examination were limited- see technologist comments above.  - No cystic structure found in the popliteal fossa.  *See table(s) above for measurements and observations. Electronically signed by Orlie Pollen on 09/17/2021 at 6:46:33 AM.    Final    ECHOCARDIOGRAM LIMITED  Result Date: 09/17/2021    ECHOCARDIOGRAM LIMITED REPORT   Patient Name:   MELROSE SANDGREN Date of Exam: 09/17/2021 Medical Rec #:  [id number]          Height:       69.0 in Accession #:    [id number]         Weight:       183.6 lb Date of Birth:  1945-11-21           BSA:          1.992 m Patient Age:    75 years           BP:           117/68 mmHg Patient Gender: M                  HR:           46 bpm. Exam Location:  Inpatient Procedure: Limited Echo Indications:    chest pain  History:        Patient has prior history of Echocardiogram examinations, most                 recent 03/12/2021. COPD; Risk Factors:Diabetes, Dyslipidemia and                  Hypertension.  Sonographer:    Johny Chess RDCS Referring Phys: [id number] [place]  1. Left ventricular ejection fraction, by estimation, is 60 to 65%. The left ventricle has normal function. Left ventricular diastolic parameters are consistent with Grade I diastolic dysfunction (impaired relaxation). Frequent ventricular ectopy.  2. The pericardial effusion is anterior to the right ventricle, but is trivial.  3. Right ventricular systolic function is normal. The right ventricular size is normal. Tricuspid regurgitation signal is inadequate for assessing PA pressure.  4. The mitral valve is grossly normal. No evidence of mitral valve regurgitation. No evidence of mitral stenosis.  5. The aortic valve was not well visualized. Aortic valve regurgitation is not visualized. Aortic valve sclerosis is present, with no evidence of aortic valve stenosis.  6. Aortic dilatation noted. There is mild dilatation of the ascending aorta, measuring 40 mm. Comparison(s): Ectopy is new from prior study. FINDINGS  Left Ventricle: Frequent ectopy noted. Left ventricular ejection fraction, by estimation, is 60 to 65%. The left ventricle has normal function. There is no left ventricular hypertrophy. Left ventricular diastolic parameters are consistent with Grade I diastolic dysfunction (impaired relaxation). Right Ventricle:  The right ventricular size is normal. No increase in right ventricular wall thickness. Right ventricular systolic function is normal. Tricuspid regurgitation signal is inadequate for assessing PA pressure. Pericardium: Trivial pericardial effusion is present. The pericardial effusion is anterior to the right ventricle. Presence of epicardial fat layer. Mitral Valve: The mitral valve is grossly normal. No evidence of mitral valve stenosis. Tricuspid Valve: The tricuspid valve is normal in structure. Tricuspid valve regurgitation is not demonstrated. No evidence of tricuspid stenosis.  Aortic Valve: The aortic valve was not well visualized. Aortic valve regurgitation is not visualized. Aortic valve sclerosis is present, with no evidence of aortic valve stenosis. Pulmonic Valve: The pulmonic valve was not well visualized. Aorta: Aortic dilatation noted. There is mild dilatation of the ascending aorta, measuring 40 mm. IAS/Shunts: No atrial level shunt detected by color flow Doppler. LEFT VENTRICLE PLAX 2D LVIDd:         5.30 cm LVIDs:         3.80 cm LV PW:         0.90 cm LV IVS:        0.90 cm LVOT diam:     2.10 cm LVOT Area:     3.46 cm  LEFT ATRIUM         Index LA diam:    3.40 cm 1.71 cm/m   AORTA Ao Asc diam: 3.80 cm MITRAL VALVE MV Area (PHT): 3.12 cm    SHUNTS MV Decel Time: 243 msec    Systemic Diam: 2.10 cm MV E velocity: 62.90 cm/s MV A velocity: 93.80 cm/s MV E/A ratio:  0.67 Rudean Haskell MD Electronically signed by Rudean Haskell MD Signature Date/Time: 09/17/2021/9:01:49 AM    Final      LOS: 1 day   Oren Binet, MD  [facility]   To contact the attending provider between 7A-7P or the covering provider during after hours 7P-7A, please log into the web site [email or web address] and access using universal [place] password for that web site. If you do not have the password, please call the hospital operator.  09/18/2021, 11:44 AM

## 2021-09-19 DIAGNOSIS — J449 Chronic obstructive pulmonary disease, unspecified: Secondary | ICD-10-CM

## 2021-09-19 DIAGNOSIS — M25512 Pain in left shoulder: Secondary | ICD-10-CM

## 2021-09-19 DIAGNOSIS — N1832 Chronic kidney disease, stage 3b: Secondary | ICD-10-CM

## 2021-09-19 DIAGNOSIS — R079 Chest pain, unspecified: Secondary | ICD-10-CM

## 2021-09-19 LAB — COMPREHENSIVE METABOLIC PANEL
ALT: 13 U/L (ref 0–44)
AST: 31 U/L (ref 15–41)
Albumin: 3 g/dL — ABNORMAL LOW (ref 3.5–5.0)
Alkaline Phosphatase: 62 U/L (ref 38–126)
Anion gap: 8 (ref 5–15)
BUN: 29 mg/dL — ABNORMAL HIGH (ref 8–23)
CO2: 20 mmol/L — ABNORMAL LOW (ref 22–32)
Calcium: 8.2 mg/dL — ABNORMAL LOW (ref 8.9–10.3)
Chloride: 108 mmol/L (ref 98–111)
Creatinine, Ser: 1.67 mg/dL — ABNORMAL HIGH (ref 0.61–1.24)
GFR, Estimated: 42 mL/min — ABNORMAL LOW (ref >60)
Glucose, Bld: 105 mg/dL — ABNORMAL HIGH (ref 70–99)
Potassium: 5 mmol/L (ref 3.5–5.1)
Sodium: 136 mmol/L (ref 135–145)
Total Bilirubin: 0.6 mg/dL (ref 0.3–1.2)
Total Protein: 5.9 g/dL — ABNORMAL LOW (ref 6.5–8.1)

## 2021-09-19 LAB — CBC WITH DIFFERENTIAL/PLATELET
Abs Immature Granulocytes: 0.02 10*3/uL (ref 0.00–0.07)
Basophils Absolute: 0 10*3/uL (ref 0.0–0.1)
Basophils Relative: 0 %
Eosinophils Absolute: 0.3 10*3/uL (ref 0.0–0.5)
Eosinophils Relative: 5 %
HCT: 33.2 % — ABNORMAL LOW (ref 39.0–52.0)
Hemoglobin: 10.7 g/dL — ABNORMAL LOW (ref 13.0–17.0)
Immature Granulocytes: 0 %
Lymphocytes Relative: 45 %
Lymphs Abs: 2.1 10*3/uL (ref 0.7–4.0)
MCH: 27.5 pg (ref 26.0–34.0)
MCHC: 32.2 g/dL (ref 30.0–36.0)
MCV: 85.3 fL (ref 80.0–100.0)
Monocytes Absolute: 0.4 10*3/uL (ref 0.1–1.0)
Monocytes Relative: 10 %
Neutro Abs: 1.9 10*3/uL (ref 1.7–7.7)
Neutrophils Relative %: 40 %
Platelets: 192 10*3/uL (ref 150–400)
RBC: 3.89 MIL/uL — ABNORMAL LOW (ref 4.22–5.81)
RDW: 15 % (ref 11.5–15.5)
WBC: 4.6 10*3/uL (ref 4.0–10.5)
nRBC: 0 % (ref 0.0–0.2)

## 2021-09-19 LAB — GLUCOSE, CAPILLARY
Glucose-Capillary: 91 mg/dL (ref 70–99)
Glucose-Capillary: 139 mg/dL — ABNORMAL HIGH (ref 70–99)
Glucose-Capillary: 160 mg/dL — ABNORMAL HIGH (ref 70–99)
Glucose-Capillary: 139 mg/dL — ABNORMAL HIGH (ref 70–99)

## 2021-09-19 MED ORDER — MOLNUPIRAVIR EUA 200MG CAPSULE: 4.0000 | ORAL_CAPSULE | Freq: Two times a day (BID) | ORAL | 0 refills | Status: AC

## 2021-09-19 MED ORDER — MOLNUPIRAVIR EUA 200MG CAPSULE: 4.0000 | ORAL_CAPSULE | Freq: Two times a day (BID) | ORAL | 0 refills | Status: DC

## 2021-09-19 NOTE — TOC Transition Note (Signed)
Transition of Care [facility]) - CM/SW Discharge Note   Patient Details  Name: DEMERE CAPEL MRN: [id number] Date of Birth: [birth year]  Transition of Care [facility] CM/SW Contact:  Cyndi Bender, RN Phone Number: 09/19/2021, 2:30 PM   Clinical Narrative:    Patient stable for discharge. Outpatient rehab has been arranged and on AVS. Sister can transport patient home today. No other needs from [facility]    Final next level of care: OP Rehab Barriers to Discharge: Barriers Resolved   Patient Goals and CMS Choice Patient states their goals for this hospitalization and ongoing recovery are:: to return home CMS Medicare.gov Compare Post Acute Care list provided to:: Other (Comment Required) Choice offered to / list presented to : Sibling  Discharge Placement               home        Discharge Plan and Services                DME Arranged: 3-N-1 DME Agency: [employer] Date DME Agency Contacted: 09/18/21 Time DME Agency Contacted: [phone] Representative spoke with at DME Agency: [place] ([place]) Interventions     Readmission Risk Interventions No flowsheet data found.

## 2021-09-19 NOTE — Consult Note (Signed)
[facility] CM Inpatient Consult   09/19/2021  BRISCOE DANIELLO [birth year] [phone]  [facility] Care Management [facility] CM)  Patient screened for hospitalization with noted high risk score for unplanned readmission assessing potential [facility] Care Management service needs for post hospital transition. Per review, Primary Care Provider: Fleet Contras, MD, is not a [facility] affiliated provider.   Of note, [facility] Care Management services does not replace or interfere with any services that are arranged by inpatient case management or social work.   Christophe Louis, MSN, RN [facility] [employer] [phone]  Toll free office [phone]

## 2021-09-19 NOTE — Progress Notes (Signed)
Occupational Therapy Treatment Patient Details Name: Luis Carter MRN: [id number] DOB: [birth year] Today's Date: 09/19/2021   History of present illness Luis Carter is a 76 y.o. male presenting to the ED with chest pain and R foot pain; with medical history significant of HTN, HLD, CKD 3B, DM2, gout, COPD, prior cocaine use.   OT comments  Pt making good progress with functional goals. Pt is eager to d/c home later this afternoon. Session focused on ADL mobility safety using RW to transfer to toilet, toileting tasks, UB ADLs, grooming/hygiene standing at sink   Recommendations for follow up therapy are one component of a multi-disciplinary discharge planning process, led by the attending physician.  Recommendations may be updated based on patient status, additional functional criteria and insurance authorization.    Follow Up Recommendations  Home health OT    Assistance Recommended at Discharge Intermittent Supervision/Assistance  Patient can return home with the following  A little help with walking and/or transfers;A little help with bathing/dressing/bathroom;Help with stairs or ramp for entrance;Assist for transportation   Equipment Recommendations  BSC/3in1;Other (comment) (RW could greatly decrease risk for falls)    Recommendations for Other Services      Precautions / Restrictions Precautions Precautions: Fall Restrictions Weight Bearing Restrictions: No       Mobility Bed Mobility               General bed mobility comments: pt in recliner upon arrival    Transfers Overall transfer level: Needs assistance Equipment used: Rolling walker (2 wheels) Transfers: Sit to/from Stand Sit to Stand: Min guard     Step pivot transfers: Min guard           Balance Overall balance assessment: Needs assistance Sitting-balance support: No upper extremity supported, Feet supported Sitting balance-Leahy Scale: Good     Standing balance support:  Bilateral upper extremity supported, Reliant on assistive device for balance, Single extremity supported Standing balance-Leahy Scale: Poor                             ADL either performed or assessed with clinical judgement   ADL Overall ADL's : Needs assistance/impaired     Grooming: Wash/dry hands;Wash/dry face;Min guard;Standing           Upper Body Dressing : Set up;Sitting       Toilet Transfer: Min guard;Ambulation;Rolling walker (2 wheels);Cueing for safety;BSC/3in1   Toileting- Architect and Hygiene: Min guard;Sit to/from stand       Functional mobility during ADLs: Min guard;Rolling walker (2 wheels);Cueing for safety      Extremity/Trunk Assessment Upper Extremity Assessment Upper Extremity Assessment: Generalized weakness   Lower Extremity Assessment Lower Extremity Assessment: Defer to PT evaluation        Vision Baseline Vision/History: 1 Wears glasses Ability to See in Adequate Light: 0 Adequate Patient Visual Report: No change from baseline     Perception     Praxis      Cognition Arousal/Alertness: Awake/alert Behavior During Therapy: WFL for tasks assessed/performed Overall Cognitive Status: Within Functional Limits for tasks assessed                                          Exercises      Shoulder Instructions       General Comments      Pertinent Vitals/  Pain       Pain Assessment Pain Assessment: Faces Faces Pain Scale: Hurts a little bit Pain Location: B knees Pain Descriptors / Indicators: Aching, Grimacing Pain Intervention(s): Monitored during session, Repositioned  Home Living                                          Prior Functioning/Environment              Frequency  Min 2X/week        Progress Toward Goals  OT Goals(current goals can now be found in the care plan section)  Progress towards OT goals: Progressing toward goals     Plan       Co-evaluation                 AM-PAC OT "6 Clicks" Daily Activity     Outcome Measure   Help from another person eating meals?: None Help from another person taking care of personal grooming?: A Little Help from another person toileting, which includes using toliet, bedpan, or urinal?: A Little Help from another person bathing (including washing, rinsing, drying)?: A Little Help from another person to put on and taking off regular upper body clothing?: A Little Help from another person to put on and taking off regular lower body clothing?: A Little 6 Click Score: 19    End of Session Equipment Utilized During Treatment: Gait belt;Rolling walker (2 wheels);Other (comment) (BSC)  OT Visit Diagnosis: Unsteadiness on feet (R26.81);Other abnormalities of gait and mobility (R26.89);Muscle weakness (generalized) (M62.81)   Activity Tolerance Patient tolerated treatment well   Patient Left in chair;with call bell/phone within reach   Nurse Communication          Time: 6237-6283 OT Time Calculation (min): 16 min  Charges: OT General Charges $OT Visit: 1 Visit OT Treatments $Therapeutic Activity: 8-22 mins   Galen Manila 09/19/2021, 1:27 PM

## 2021-09-19 NOTE — Discharge Summary (Signed)
PATIENT DETAILS Name: Luis Carter Age: 76 y.o. Sex: male Date of Birth: 23-Jun-1946 MRN: [id number]. Admitting Physician: Jonetta Osgood, MD [id number], Christean Grief, MD  Admit Date: 09/15/2021 Discharge date: 09/19/2021  Recommendations for Outpatient Follow-up:  Follow up with PCP in 1-2 weeks Please obtain CMP/CBC in one week Antihypertensives on hold-resume when able.  Admitted From:  Home  Disposition: Home with home health services   [place]: No  Equipment/Devices: None  Discharge Condition: Stable  CODE STATUS: FULL CODE  Diet recommendation:  Diet Order             Diet - low sodium heart healthy           Diet Carb Modified           Diet Heart Room service appropriate? Yes; Fluid consistency: Thin  Diet effective now                    Brief Summary: 76 year old with a history of HTN, HLD, CKD stage IIIb, DM-2, COPD, remote history of cocaine use-who presented with retrosternal chest pain x2 weeks, and right hip pain x2 days.  Patient was subsequently admitted to the hospitalist service.  Significant events: 1/22>> admit to [facility].   Brief Hospital Course: * Acute chest pain- (present on admission) No further chest pain-stable EF on echo-no further recommendations from cardiology.  Acute right hip pain- (present on admission) X-ray of bilateral hips negative for any fractures-Dopplers negative for DVT.  No concerning findings on exam.  Continues to complain of pain in hips/knee areas.  Suspect has myalgias/arthritis related to COVID-19 infection.  Continue supportive care-we will arrange for home health services on discharge.   COVID-19 virus infection- (present on admission) Not hypoxic-no PNA on CXR-continue  molnupiravir x 5 days  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, stage 3b ([place])- (present on admission) AKI likely hemodynamically mediated-due to COVID-19 infection/dehydration.  Creatinine is improved with supportive care-PCP to recheck  electrolytes in 1 week.  Hypertension- (present on admission) BP was soft a few days ago-slowly improving-systolic in the low [id number] today.  Continue to hold amlodipine/enalapril until seen by PCP.  Diabetes mellitus with renal complications ([place])- (present on admission) Last A1c 6.6 on 03/12/2021.  CBG stable with SSI.  Resume metformin prior to discharge.    Recent Labs    09/17/21 2355 09/18/21 0356 09/18/21 0811  GLUCAP 103* 99 89    HLD (hyperlipidemia)- (present on admission) Continue statin  COPD (chronic obstructive pulmonary disease) (HCC)- (present on admission) Not in exacerbation-continue bronchodilators.  Gout- (present on admission) Does not seem to be in exacerbation-continue allopurinol.  Physical debility- (present on admission) Due to COVID-19 infection-appears weak from baseline-evaluated by PT/OT with recommendations for home health.  BMI: Estimated body mass index is 27.11 kg/m as calculated from the following:   Height as of 03/12/21: 5\' 9"  (1.753 m).   Weight as of 03/16/21: 83.3 kg.    Discharge Diagnoses:  Principal Problem:   Acute chest pain Active Problems:   COVID-19 virus infection   Acute right hip pain   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, stage 3b (HCC)   Hypertension   Diabetes mellitus with renal complications (HCC)   HLD (hyperlipidemia)   COPD (chronic obstructive pulmonary disease) (HCC)   Gout   Physical debility   Discharge Instructions:  Activity:  As tolerated with Full fall precautions use walker/cane & assistance as needed   Discharge Instructions     Ambulatory referral to Occupational  Therapy   Complete by: As directed    Ambulatory referral to Physical Therapy   Complete by: As directed    Call MD for:  difficulty breathing, headache or visual disturbances   Complete by: As directed    Diet - low sodium heart healthy   Complete by: As directed    Diet Carb Modified   Complete by: As directed    Discharge  instructions   Complete by: As directed    Follow with Primary MD  Nolene Ebbs, MD in 1-2 weeks  Please get a complete blood count and chemistry panel checked by your Primary MD at your next visit, and again as instructed by your Primary MD.  Get Medicines reviewed and adjusted: Please take all your medications with you for your next visit with your Primary MD  Laboratory/radiological data: Please request your Primary MD to go over all hospital tests and procedure/radiological results at the follow up, please ask your Primary MD to get all Hospital records sent to his/her office.  In some cases, they will be blood work, cultures and biopsy results pending at the time of your discharge. Please request that your primary care M.D. follows up on these results.  Also Note the following: If you experience worsening of your admission symptoms, develop shortness of breath, life threatening emergency, suicidal or homicidal thoughts you must seek medical attention immediately by calling 911 or calling your MD immediately  if symptoms less severe.  You must read complete instructions/literature along with all the possible adverse reactions/side effects for all the Medicines you take and that have been prescribed to you. Take any new Medicines after you have completely understood and accpet all the possible adverse reactions/side effects.   Do not drive when taking Pain medications or sleeping medications (Benzodaizepines)  Do not take more than prescribed Pain, Sleep and Anxiety Medications. It is not advisable to combine anxiety,sleep and pain medications without talking with your primary care practitioner  Special Instructions: If you have smoked or chewed Tobacco  in the last 2 yrs please stop smoking, stop any regular Alcohol  and or any Recreational drug use.  Wear Seat belts while driving.  Please note: You were cared for by a hospitalist during your hospital stay. Once you are discharged,  your primary care physician will handle any further medical issues. Please note that NO REFILLS for any discharge medications will be authorized once you are discharged, as it is imperative that you return to your primary care physician (or establish a relationship with a primary care physician if you do not have one) for your post hospital discharge needs so that they can reassess your need for medications and monitor your lab values.   Your blood pressure medications on hold as her blood pressure was on the lower side during this hospitalization-please ask your primary care practitioner before resuming this medication.   Increase activity slowly   Complete by: As directed       Allergies as of 09/19/2021   No Known Allergies      Medication List     STOP taking these medications    amLODipine 5 MG tablet Commonly known as: NORVASC   ibuprofen 200 MG tablet Commonly known as: ADVIL   lisinopril 20 MG tablet Commonly known as: ZESTRIL   meloxicam 7.5 MG tablet Commonly known as: MOBIC       TAKE these medications    albuterol 108 (90 Base) MCG/ACT inhaler Commonly known as: ProAir HFA INHALE  2 PUFFS INTO THE LUNGS EVERY 6 (SIX) HOURS AS NEEDED FOR WHEEZING. What changed:  how much to take how to take this when to take this reasons to take this additional instructions   allopurinol 100 MG tablet Commonly known as: ZYLOPRIM Take 2 tablets (200 mg total) by mouth daily.   aspirin 81 MG EC tablet Commonly known as: [facility] Aspirin Low Dose Take 1 tablet (81 mg total) by mouth daily. Swallow whole.   atorvastatin 20 MG tablet Commonly known as: LIPITOR TAKE 1 TABLET BY MOUTH EVERY DAY   colchicine 0.6 MG tablet Commonly known as: Colcrys Take 1 tablet (0.6 mg total) by mouth 2 (two) times daily as needed (gout flares).   fluticasone 50 MCG/ACT nasal spray Commonly known as: FLONASE Place 2 sprays into both nostrils 2 (two) times daily.   fluticasone-salmeterol  250-50 MCG/ACT Aepb Commonly known as: ADVAIR Inhale 1 puff into the lungs in the morning and at bedtime. What changed:  when to take this reasons to take this   gabapentin 300 MG capsule Commonly known as: NEURONTIN Take 300 mg by mouth 3 (three) times daily.   ipratropium-albuterol 0.5-2.5 (3) MG/3ML Soln Commonly known as: DUONEB Take 3 mLs by nebulization every 6 (six) hours as needed. What changed: reasons to take this   metFORMIN 500 MG tablet Commonly known as: GLUCOPHAGE Take 1 tablet (500 mg total) by mouth 2 (two) times daily.   molnupiravir EUA 200 mg Caps capsule Commonly known as: LAGEVRIO Take 4 capsules (800 mg total) by mouth 2 (two) times daily for 5 days.   montelukast 10 MG tablet Commonly known as: SINGULAIR Take 1 tablet (10 mg total) by mouth at bedtime.   omeprazole 20 MG capsule Commonly known as: PRILOSEC Take 1 capsule (20 mg total) by mouth daily.   Vitamin D (Ergocalciferol) 1.25 MG (50000 UNIT) Caps capsule Commonly known as: DRISDOL Take 50,000 Units by mouth every Monday.               Durable Medical Equipment  (From admission, onward)           Start     Ordered   09/18/21 1359  For home use only DME 3 n 1  Once        09/18/21 1358            Follow-up Information     [facility] Schedule an appointment as soon as possible for a visit.   Specialty: Rehabilitation Why: a referral has been made for you electronically. you may call to get an appointment scheduled faster Contact information: [address] [id number] [address] [place]        Nolene Ebbs, MD. Schedule an appointment as soon as possible for a visit in 1 week(s).   Specialty: Internal Medicine Contact information: [place] [address] [phone]         Belva Crome, MD Follow up in 1 month(s).   Specialty: Cardiology Contact  information: [id number] [place] [address] [phone]                No Known Allergies    Consultations:  cardiology    Other Procedures/Studies: DG Chest Port 1 View  Result Date: 09/15/2021 CLINICAL DATA:  Chest pain EXAM: PORTABLE CHEST 1 VIEW COMPARISON:  03/13/2021 FINDINGS: Heart and mediastinal contours are within normal limits. No focal opacities or effusions. No acute bony abnormality. IMPRESSION:  No active disease. Electronically Signed   By: Rolm Baptise M.D.   On: 09/15/2021 17:36   DG HIP UNILAT WITH PELVIS 2-3 VIEWS LEFT  Result Date: 09/18/2021 CLINICAL DATA:  A 76 year old male presents with acute hip pain, no history of reported injury. EXAM: DG HIP (WITH OR WITHOUT PELVIS) 2-3V LEFT COMPARISON:  Contralateral hip on September 16, 2021 FINDINGS: No signs of fracture or dislocation. Mild degenerative changes about the LEFT and RIGHT hip. Degenerative changes in the lumbar spine. Soft tissues are grossly normal. IMPRESSION: Mild degenerative changes without acute osseous abnormality. Electronically Signed   By: Zetta Bills M.D.   On: 09/18/2021 09:25   DG HIP UNILAT WITH PELVIS 2-3 VIEWS RIGHT  Result Date: 09/16/2021 CLINICAL DATA:  Right hip pain for 2 days EXAM: DG HIP (WITH OR WITHOUT PELVIS) 2-3V RIGHT COMPARISON:  None. FINDINGS: There is no evidence of hip fracture or dislocation. Mild joint space narrowing of the right hip. No other focal bone abnormality. 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 Scattered vascular calcifications. IMPRESSION: Mild degenerative changes of the right hip.  No acute findings. Electronically Signed   By: Davina Poke D.O.   On: 09/16/2021 11:12   VAS [place] LOWER EXTREMITY VENOUS (DVT)  Result Date: 09/17/2021  Lower Venous DVT Study Patient Name:  NYAIR SAMPAIO  Date of Exam:   09/16/2021 Medical Rec #: [id number]           Accession #:    [id number] Date of Birth: 1946-02-03             Patient Gender: M Patient Age:   4 years Exam [facility]  [facility] Procedure:      VAS [place] LOWER EXTREMITY VENOUS (DVT) Referring Phys: Oren Binet --------------------------------------------------------------------------------  Indications: Swelling.  Risk Factors: COVID 19 positive. Comparison Study: No prior studies. Performing Technologist: Oliver Hum RVT  Examination Guidelines: A complete evaluation includes B-mode imaging, spectral Doppler, color Doppler, and power Doppler as needed of all accessible portions of each vessel. Bilateral testing is considered an integral part of a complete examination. Limited examinations for reoccurring indications may be performed as noted. The reflux portion of the exam is performed with the patient in reverse Trendelenburg.  +---------+---------------+---------+-----------+----------+--------------+  RIGHT     Compressibility Phasicity Spontaneity Properties Thrombus Aging  +---------+---------------+---------+-----------+----------+--------------+  CFV       Full            Yes       Yes                                    +---------+---------------+---------+-----------+----------+--------------+  SFJ       Full                                                             +---------+---------------+---------+-----------+----------+--------------+  FV Prox   Full                                                             +---------+---------------+---------+-----------+----------+--------------+  FV Mid    Full                                                             +---------+---------------+---------+-----------+----------+--------------+  FV Distal Full                                                             +---------+---------------+---------+-----------+----------+--------------+  PFV       Full                                                             +---------+---------------+---------+-----------+----------+--------------+   POP       Full            Yes       Yes                                    +---------+---------------+---------+-----------+----------+--------------+  PTV       Full                                                             +---------+---------------+---------+-----------+----------+--------------+  PERO      Full                                                             +---------+---------------+---------+-----------+----------+--------------+   +---------+---------------+---------+-----------+----------+--------------+  LEFT      Compressibility Phasicity Spontaneity Properties Thrombus Aging  +---------+---------------+---------+-----------+----------+--------------+  CFV       Full            Yes       Yes                                    +---------+---------------+---------+-----------+----------+--------------+  SFJ       Full                                                             +---------+---------------+---------+-----------+----------+--------------+  FV Prox   Full                                                             +---------+---------------+---------+-----------+----------+--------------+  FV Mid    Full                                                             +---------+---------------+---------+-----------+----------+--------------+  FV Distal Full                                                             +---------+---------------+---------+-----------+----------+--------------+  PFV       Full                                                             +---------+---------------+---------+-----------+----------+--------------+  POP       Full            Yes       Yes                                    +---------+---------------+---------+-----------+----------+--------------+  PTV       Full                                                             +---------+---------------+---------+-----------+----------+--------------+  PERO      Full                                                              +---------+---------------+---------+-----------+----------+--------------+     Summary: RIGHT: - There is no evidence of deep vein thrombosis in the lower extremity. However, portions of this examination were limited- see technologist comments above.  - No cystic structure found in the popliteal fossa.  LEFT: - There is no evidence of deep vein thrombosis in the lower extremity. However, portions of this examination were limited- see technologist comments above.  - No cystic structure found in the popliteal fossa.  *See table(s) above for measurements and observations. Electronically signed by Orlie Pollen on 09/17/2021 at 6:46:33 AM.    Final    ECHOCARDIOGRAM LIMITED  Result Date: 09/17/2021    ECHOCARDIOGRAM LIMITED REPORT   Patient Name:   WILBORN DEETER Date of Exam: 09/17/2021 Medical Rec #:  [id number]          Height:       69.0 in Accession #:    [id number]         Weight:       183.6 lb Date of Birth:  [birth year]           BSA:  1.992 m Patient Age:    75 years           BP:           117/68 mmHg Patient Gender: M                  HR:           46 bpm. Exam Location:  Inpatient Procedure: Limited Echo Indications:    chest pain  History:        Patient has prior history of Echocardiogram examinations, most                 recent 03/12/2021. COPD; Risk Factors:Diabetes, Dyslipidemia and                 Hypertension.  Sonographer:    Delcie RochLauren Pennington RDCS Referring Phys: [id number] ANGELA NICOLE DUKE IMPRESSIONS  1. Left ventricular ejection fraction, by estimation, is 60 to 65%. The left ventricle has normal function. Left ventricular diastolic parameters are consistent with Grade I diastolic dysfunction (impaired relaxation). Frequent ventricular ectopy.  2. The pericardial effusion is anterior to the right ventricle, but is trivial.  3. Right ventricular systolic function is normal. The right ventricular size is normal. Tricuspid regurgitation signal is inadequate  for assessing PA pressure.  4. The mitral valve is grossly normal. No evidence of mitral valve regurgitation. No evidence of mitral stenosis.  5. The aortic valve was not well visualized. Aortic valve regurgitation is not visualized. Aortic valve sclerosis is present, with no evidence of aortic valve stenosis.  6. Aortic dilatation noted. There is mild dilatation of the ascending aorta, measuring 40 mm. Comparison(s): Ectopy is new from prior study. FINDINGS  Left Ventricle: Frequent ectopy noted. Left ventricular ejection fraction, by estimation, is 60 to 65%. The left ventricle has normal function. There is no left ventricular hypertrophy. Left ventricular diastolic parameters are consistent with Grade I diastolic dysfunction (impaired relaxation). Right Ventricle: The right ventricular size is normal. No increase in right ventricular wall thickness. Right ventricular systolic function is normal. Tricuspid regurgitation signal is inadequate for assessing PA pressure. Pericardium: Trivial pericardial effusion is present. The pericardial effusion is anterior to the right ventricle. Presence of epicardial fat layer. Mitral Valve: The mitral valve is grossly normal. No evidence of mitral valve stenosis. Tricuspid Valve: The tricuspid valve is normal in structure. Tricuspid valve regurgitation is not demonstrated. No evidence of tricuspid stenosis. Aortic Valve: The aortic valve was not well visualized. Aortic valve regurgitation is not visualized. Aortic valve sclerosis is present, with no evidence of aortic valve stenosis. Pulmonic Valve: The pulmonic valve was not well visualized. Aorta: Aortic dilatation noted. There is mild dilatation of the ascending aorta, measuring 40 mm. IAS/Shunts: No atrial level shunt detected by color flow Doppler. LEFT VENTRICLE PLAX 2D LVIDd:         5.30 cm LVIDs:         3.80 cm LV PW:         0.90 cm LV IVS:        0.90 cm LVOT diam:     2.10 cm LVOT Area:     3.46 cm  LEFT ATRIUM          Index LA diam:    3.40 cm 1.71 cm/m   AORTA Ao Asc diam: 3.80 cm MITRAL VALVE MV Area (PHT): 3.12 cm    SHUNTS MV Decel Time: 243 msec    Systemic Diam: 2.10 cm MV E velocity: 62.90  cm/s MV A velocity: 93.80 cm/s MV E/A ratio:  0.67 Rudean Haskell MD Electronically signed by Rudean Haskell MD Signature Date/Time: 09/17/2021/9:01:49 AM    Final      TODAY-DAY OF DISCHARGE:  Subjective:   Luis Carter today has no headache,no chest abdominal pain,no new weakness tingling or numbness, feels much better wants to go home today.   Objective:   Blood pressure 119/63, pulse 76, temperature 97.7 F (36.5 C), temperature source Oral, resp. rate (!) 21, SpO2 98 %.  Intake/Output Summary (Last 24 hours) at 09/19/2021 1117 Last data filed at 09/19/2021 [id number] Gross per 24 hour  Intake 120 ml  Output 925 ml  Net -805 ml   There were no vitals filed for this visit.  Exam: Awake Alert, Oriented *3, No new F.N deficits, Normal affect [place].AT,PERRAL Supple Neck,No JVD, No cervical lymphadenopathy appriciated.  Symmetrical Chest wall movement, Good air movement bilaterally, CTAB RRR,No Gallops,Rubs or new Murmurs, No Parasternal Heave +ve B.Sounds, Abd Soft, Non tender, No organomegaly appriciated, No rebound -guarding or rigidity. No Cyanosis, Clubbing or edema, No new Rash or bruise   PERTINENT RADIOLOGIC STUDIES: DG HIP UNILAT WITH PELVIS 2-3 VIEWS LEFT  Result Date: 09/18/2021 CLINICAL DATA:  A 76 year old male presents with acute hip pain, no history of reported injury. EXAM: DG HIP (WITH OR WITHOUT PELVIS) 2-3V LEFT COMPARISON:  Contralateral hip on September 16, 2021 FINDINGS: No signs of fracture or dislocation. Mild degenerative changes about the LEFT and RIGHT hip. Degenerative changes in the lumbar spine. Soft tissues are grossly normal. IMPRESSION: Mild degenerative changes without acute osseous abnormality. Electronically Signed   By: Zetta Bills M.D.   On: 09/18/2021  09:25     PERTINENT LAB RESULTS: CBC: Recent Labs    09/18/21 0121 09/19/21 0232  WBC 4.5 4.6  HGB 11.0* 10.7*  HCT 34.2* 33.2*  PLT 191 192   CMET CMP     Component Value Date/Time   NA 136 09/19/2021 0232   K 5.0 09/19/2021 0232   CL 108 09/19/2021 0232   CO2 20 (L) 09/19/2021 0232   GLUCOSE 105 (H) 09/19/2021 0232   GLUCOSE 103 (H) 06/03/2006 1131   BUN 29 (H) 09/19/2021 0232   CREATININE 1.67 (H) 09/19/2021 0232   CREATININE 1.64 (H) 11/19/2020 1409   CALCIUM 8.2 (L) 09/19/2021 0232   PROT 5.9 (L) 09/19/2021 0232   ALBUMIN 3.0 (L) 09/19/2021 0232   AST 31 09/19/2021 0232   ALT 13 09/19/2021 0232   ALKPHOS 62 09/19/2021 0232   BILITOT 0.6 09/19/2021 0232   GFRNONAA 42 (L) 09/19/2021 0232   GFRNONAA 40 (L) 11/19/2020 1409   GFRAA 46 (L) 11/19/2020 1409    GFR CrCl cannot be calculated (Unknown ideal weight.). No results for input(s): LIPASE, AMYLASE in the last 72 hours. No results for input(s): CKTOTAL, CKMB, CKMBINDEX, TROPONINI in the last 72 hours. Invalid input(s): POCBNP No results for input(s): DDIMER in the last 72 hours. No results for input(s): HGBA1C in the last 72 hours. No results for input(s): CHOL, HDL, LDLCALC, TRIG, CHOLHDL, LDLDIRECT in the last 72 hours. No results for input(s): TSH, T4TOTAL, T3FREE, THYROIDAB in the last 72 hours.  Invalid input(s): FREET3 No results for input(s): VITAMINB12, FOLATE, FERRITIN, TIBC, IRON, RETICCTPCT in the last 72 hours. Coags: No results for input(s): INR in the last 72 hours.  Invalid input(s): PT Microbiology: Recent Results (from the past 240 hour(s))  Resp Panel by RT-PCR (Flu A&B, Covid)  Status: Abnormal   Collection Time: 09/15/21 10:20 PM   Specimen: Nasopharyngeal(NP) swabs in vial transport medium  Result Value Ref Range Status   SARS Coronavirus 2 by RT PCR POSITIVE (A) NEGATIVE Final    Comment: (NOTE) SARS-CoV-2 target nucleic acids are DETECTED.  The SARS-CoV-2 RNA is generally  detectable in upper respiratory specimens during the acute phase of infection. Positive results are indicative of the presence of the identified virus, but do not rule out bacterial infection or co-infection with other pathogens not detected by the test. Clinical correlation with patient history and other diagnostic information is necessary to determine patient infection status. The expected result is Negative.  Fact Sheet for Patients: EntrepreneurPulse.com.au  Fact Sheet for Healthcare Providers: IncredibleEmployment.be  This test is not yet approved or cleared by the [place] FDA and  has been authorized for detection and/or diagnosis of SARS-CoV-2 by FDA under an Emergency Use Authorization (EUA).  This EUA will remain in effect (meaning this test can be used) for the duration of  the COVID-19 declaration under Section 564(b)(1) of the A ct, 21 U.S.C. section 360bbb-3(b)(1), unless the authorization is terminated or revoked sooner.     Influenza A by PCR NEGATIVE NEGATIVE Final   Influenza B by PCR NEGATIVE NEGATIVE Final    Comment: (NOTE) The Xpert Xpress SARS-CoV-2/FLU/RSV plus assay is intended as an aid in the diagnosis of influenza from Nasopharyngeal swab specimens and should not be used as a sole basis for treatment. Nasal washings and aspirates are unacceptable for Xpert Xpress SARS-CoV-2/FLU/RSV testing.  Fact Sheet for Patients: EntrepreneurPulse.com.au  Fact Sheet for Healthcare Providers: IncredibleEmployment.be  This test is not yet approved or cleared by the [place] FDA and has been authorized for detection and/or diagnosis of SARS-CoV-2 by FDA under an Emergency Use Authorization (EUA). This EUA will remain in effect (meaning this test can be used) for the duration of the COVID-19 declaration under Section 564(b)(1) of the Act, 21 U.S.C. section 360bbb-3(b)(1), unless the  authorization is terminated or revoked.  Performed at [facility], [place] [address], [place], [place] [address]     FURTHER DISCHARGE INSTRUCTIONS:  Get Medicines reviewed and adjusted: Please take all your medications with you for your next visit with your Primary MD  Laboratory/radiological data: Please request your Primary MD to go over all hospital tests and procedure/radiological results at the follow up, please ask your Primary MD to get all Hospital records sent to his/her office.  In some cases, they will be blood work, cultures and biopsy results pending at the time of your discharge. Please request that your primary care M.D. goes through all the records of your hospital data and follows up on these results.  Also Note the following: If you experience worsening of your admission symptoms, develop shortness of breath, life threatening emergency, suicidal or homicidal thoughts you must seek medical attention immediately by calling 911 or calling your MD immediately  if symptoms less severe.  You must read complete instructions/literature along with all the possible adverse reactions/side effects for all the Medicines you take and that have been prescribed to you. Take any new Medicines after you have completely understood and accpet all the possible adverse reactions/side effects.   Do not drive when taking Pain medications or sleeping medications (Benzodaizepines)  Do not take more than prescribed Pain, Sleep and Anxiety Medications. It is not advisable to combine anxiety,sleep and pain medications without talking with your primary care practitioner  Special Instructions:  If you have smoked or chewed Tobacco  in the last 2 yrs please stop smoking, stop any regular Alcohol  and or any Recreational drug use.  Wear Seat belts while driving.  Please note: You were cared for by a hospitalist during your hospital stay. Once you are discharged, your primary care physician will  handle any further medical issues. Please note that NO REFILLS for any discharge medications will be authorized once you are discharged, as it is imperative that you return to your primary care physician (or establish a relationship with a primary care physician if you do not have one) for your post hospital discharge needs so that they can reassess your need for medications and monitor your lab values.  Total Time spent coordinating discharge including counseling, education and face to face time equals 35 minutes.  SignedOren Binet 09/19/2021 11:17 AM

## 2021-09-30 NOTE — Progress Notes (Signed)
Cardiology Office Note    Date:  10/08/2021   ID:  Luis Carter, DOB [birth year], MRN [id number]   PCP:  Nolene Ebbs, MD   [place]  Cardiologist:  Sinclair Grooms, MD   Advanced Practice Provider:  No care team member to display Electrophysiologist:  None   [phone]   No chief complaint on file.   History of Present Illness:  Luis Carter is a 76 y.o. male with a hx of DM, hypertension, mild cardiomyopathy, CKD stage IIIb, hyperlipidemia, COPD, prior cocaine abuse and current smoker.  Has a possible history of prior MI in 2001, we do not have these records.  Nuclear stress test at that time was negative and he has never undergone heart catheterization.  He was seen by our service in 2017 after an episode of syncope.  Echocardiogram in 12/13/2015 showed an LVEF of 45 to [id number], grade 1 diastolic dysfunction, and no significant valvular disease.  Nuclear stress test June 2017 was nonischemic.  Heart monitor at that time revealed no arrhythmia.  Syncope consistent with post micturition syncope.  He does have a history of bradycardia and has not been started on beta-blocker therapy.  He was hospitalized in February 2020 with COPD exacerbation.  He was last seen by our service in May 2020 following that hospitalization and was doing well at that time.   He has had ED visits and admissions for dyspnea and COPD exacerbation.  He was last hospitalized in July 2022 with COPD exacerbation and treated for CAP.  Echocardiogram at that time showed an improved LVEF of 60 to [id number], grade 1 diastolic dysfunction, ascending aorta of 41 mm, and no significant valvular disease.   He was seen 09/16/2021 for the evaluation of chest pain   His cardiac enzymes were mildly elevated.    While in the ER both his chest pain and foot pain resolved.  Of note he is COVID-19 positive. Echo normal LVEF, troponin flat and low, no ischemic work up.  Patient comes in with his wife. No  further chest pain, dyspnea, DOE, edema, palpitations. Has gout, walks with a cane. Eats fast food almost daily. Recovered from covid19.     Past Medical History:  Diagnosis Date   Arthritis    "left arm/shoulder; both legs" (12/12/2015)   ASTHMA    since childhood   Cardiomyopathy ([place]) 01/25/2016   Echo 12/13/15 - Mild LVH, EF 45-50%, normal wall motion, grade 1 diastolic dysfunction, mild LAE, mild RVE, mild RAE   COPD 04/14/2007   Coronary artery disease    DIABETES MELLITUS, TYPE II 04/14/2007   GERD (gastroesophageal reflux disease)    GI bleed    Gout    High cholesterol    History of nuclear stress test    a.  Myoview 6/17: EF 54%, no ischemia   HYPERTENSION 04/14/2007   LEG PAIN, RIGHT 05/19/2008   LOW BACK PAIN 04/14/2007   Myocardial infarction ([place])    ~ 2001/notes 09/13/2001 (03/22/2013)   PANCREATITIS, HX OF 04/14/2007   Pneumonia    "twice" (12/12/2015)   SEIZURE DISORDER 04/14/2007   "used to have them when I was young" (03/22/2013)   Shortness of breath    "related to asthma" (03/22/2013)    Past Surgical History:  Procedure Laterality Date   CORONARY ANGIOPLASTY WITH STENT PLACEMENT     ESOPHAGOGASTRODUODENOSCOPY  12/21/2011   Procedure: ESOPHAGOGASTRODUODENOSCOPY (EGD);  Surgeon: Inda Castle, MD;  Location: [facility];  Service: Endoscopy;  Laterality: N/A;    Current Medications: Current Meds  Medication Sig   albuterol (PROAIR HFA) 108 (90 BASE) MCG/ACT inhaler INHALE 2 PUFFS INTO THE LUNGS EVERY 6 (SIX) HOURS AS NEEDED FOR WHEEZING.   allopurinol (ZYLOPRIM) 100 MG tablet Take 2 tablets (200 mg total) by mouth daily.   aspirin ([employer] ASPIRIN LOW DOSE) 81 MG EC tablet Take 1 tablet (81 mg total) by mouth daily. Swallow whole.   atorvastatin (LIPITOR) 20 MG tablet TAKE 1 TABLET BY MOUTH EVERY DAY   colchicine (COLCRYS) 0.6 MG tablet Take 1 tablet (0.6 mg total) by mouth 2 (two) times daily as needed (gout flares).   fluticasone (FLONASE) 50 MCG/ACT nasal spray  Place 2 sprays into both nostrils 2 (two) times daily.   fluticasone-salmeterol (ADVAIR) 250-50 MCG/ACT AEPB Inhale 1 puff into the lungs in the morning and at bedtime.   gabapentin (NEURONTIN) 300 MG capsule Take 300 mg by mouth 3 (three) times daily.   ipratropium-albuterol (DUONEB) 0.5-2.5 (3) MG/3ML SOLN Take 3 mLs by nebulization every 6 (six) hours as needed.   lisinopril (ZESTRIL) 20 MG tablet Take 20 mg by mouth daily.   metFORMIN (GLUCOPHAGE) 500 MG tablet Take 1 tablet (500 mg total) by mouth 2 (two) times daily.   montelukast (SINGULAIR) 10 MG tablet Take 1 tablet (10 mg total) by mouth at bedtime.   omeprazole (PRILOSEC) 20 MG capsule Take 1 capsule (20 mg total) by mouth daily.   Vitamin D, Ergocalciferol, (DRISDOL) 1.25 MG (50000 UNIT) CAPS capsule Take 50,000 Units by mouth every Monday.     Allergies:   Patient has no known allergies.   Social History   Socioeconomic History   Marital status: Married    Spouse name: Not on file   Number of children: Not on file   Years of education: Not on file   Highest education level: Not on file  Occupational History   Not on file  Tobacco Use   Smoking status: Former    Packs/day: 0.25    Years: 57.00    Pack years: 14.25    Types: Cigarettes    Quit date: 11/24/2015    Years since quitting: 5.8   Smokeless tobacco: Never  Substance and Sexual Activity   Alcohol use: Not Currently    Comment: 12/12/2015 "quit drinking years ago; never had problem w/it"   Drug use: Not Currently    Types: Cocaine   Sexual activity: Not on file  Other Topics Concern   Not on file  Social History Narrative   Not on file   Social Determinants of Health   Financial Resource Strain: Not on file  Food Insecurity: Not on file  Transportation Needs: Not on file  Physical Activity: Not on file  Stress: Not on file  Social Connections: Not on file     Family History:  The patient's  family history includes Heart disease in his brother,  brother, brother, brother, and sister.   ROS:   Please see the history of present illness.    ROS All other systems reviewed and are negative.   PHYSICAL EXAM:   VS:  BP 132/76 (BP Location: Left Arm, Patient Position: Sitting, Cuff Size: Normal)    Pulse 74    Ht 5\' 9"  (1.753 m)    Wt 195 lb (88.5 kg)    SpO2 99%    BMI 28.80 kg/m   Physical Exam  GEN: Well nourished, well developed, in no acute distress  Neck: no JVD, carotid bruits, or masses Cardiac:RRR; no murmurs, rubs, or gallops  Respiratory:  clear to auscultation bilaterally, normal work of breathing GI: soft, nontender, nondistended, + BS Ext: without cyanosis, clubbing, or edema, Good distal pulses bilaterally Neuro:  Alert and Oriented x 3, Psych: euthymic mood, full affect  Wt Readings from Last 3 Encounters:  10/08/21 195 lb (88.5 kg)  03/16/21 183 lb 9.6 oz (83.3 kg)  02/09/21 177 lb 4 oz (80.4 kg)      Studies/Labs Reviewed:   EKG:  EKG is not ordered today.     Recent Labs: 03/15/2021: B Natriuretic Peptide 120.3; Magnesium 1.9; TSH 1.965 09/19/2021: ALT 13; BUN 29; Creatinine, Ser 1.67; Hemoglobin 10.7; Platelets 192; Potassium 5.0; Sodium 136   Lipid Panel    Component Value Date/Time   CHOL 104 09/16/2021 0041   TRIG 62 09/16/2021 0041   HDL 30 (L) 09/16/2021 0041   CHOLHDL 3.5 09/16/2021 0041   VLDL 12 09/16/2021 0041   LDLCALC 62 09/16/2021 0041   LDLCALC 55 11/19/2020 1409    Additional studies/ records that were reviewed today include:  Echo 09/17/21 IMPRESSIONS     1. Left ventricular ejection fraction, by estimation, is 60 to 65%. The  left ventricle has normal function. Left ventricular diastolic parameters  are consistent with Grade I diastolic dysfunction (impaired relaxation).  Frequent ventricular ectopy.   2. The pericardial effusion is anterior to the right ventricle, but is  trivial.   3. Right ventricular systolic function is normal. The right ventricular  size is normal.  Tricuspid regurgitation signal is inadequate for assessing  PA pressure.   4. The mitral valve is grossly normal. No evidence of mitral valve  regurgitation. No evidence of mitral stenosis.   5. The aortic valve was not well visualized. Aortic valve regurgitation  is not visualized. Aortic valve sclerosis is present, with no evidence of  aortic valve stenosis.   6. Aortic dilatation noted. There is mild dilatation of the ascending  aorta, measuring 40 mm.   Comparison(s): Ectopy is new from prior study. Echo 03/12/21: 1. Left ventricular ejection fraction, by estimation, is 60 to 65%. The  left ventricle has normal function. The left ventricle has no regional  wall motion abnormalities. Left ventricular diastolic parameters are  consistent with Grade I diastolic  dysfunction (impaired relaxation).   2. Right ventricular systolic function is normal. The right ventricular  size is normal.   3. The mitral valve is normal in structure. No evidence of mitral valve  regurgitation. No evidence of mitral stenosis.   4. The aortic valve is normal in structure. Aortic valve regurgitation is  not visualized. Mild aortic valve sclerosis is present, with no evidence  of aortic valve stenosis.   5. There is mild dilatation of the ascending aorta, measuring 41 mm.   6. The inferior vena cava is normal in size with greater than 50%  respiratory variability, suggesting right atrial pressure of 3 mmHg.    Risk Assessment/Calculations:         ASSESSMENT:    1. Chest pain, unspecified type   2. Cardiomyopathy, unspecified type ([place])   3. PVC (premature ventricular contraction)   4. Essential hypertension   5. Hyperlipidemia, unspecified hyperlipidemia type   6. Stage 3b chronic kidney disease ([place])      PLAN:  In order of problems listed above:  Chest pain in the setting of Covid19 infection, troponins flat, no further chest pain. Will hold off on ischemic  workup at this time  History  of cardiomyopathy but echo normal LVEF on echo 09/17/21  PVC's/bigeminy in the hospital. No dizziness or palpitation.  HTN-lisinopril and amlodipine stopped due to soft BP and CKD. PCP check labs last week. Eats fast food almost daily. BP stable today but may need treatment in future-f/u with PCP  HLD LDL 62 08/2021 on statin  CKD stage 3b-labs checked by PCP last week.  Ascending aortic dilation 30mm on echo.   Shared Decision Making/Informed Consent        Medication Adjustments/Labs and Tests Ordered: Current medicines are reviewed at length with the patient today.  Concerns regarding medicines are outlined above.  Medication changes, Labs and Tests ordered today are listed in the Patient Instructions below. Patient Instructions  Medication Instructions:  Your physician recommends that you continue on your current medications as directed. Please refer to the Current Medication list given to you today.  *If you need a refill on your cardiac medications before your next appointment, please call your pharmacy*   Lab Work: None ordered  If you have labs (blood work) drawn today and your tests are completely normal, you will receive your results only by: [place] (if you have MyChart) OR A paper copy in the mail If you have any lab test that is abnormal or we need to change your treatment, we will call you to review the results.   Testing/Procedures: None ordered   Follow-Up: At [facility], you and your health needs are our priority.  As part of our continuing mission to provide you with exceptional heart care, we have created designated Provider Care Teams.  These Care Teams include your primary Cardiologist (physician) and Advanced Practice Providers (APPs -  Physician Assistants and Nurse Practitioners) who all work together to provide you with the care you need, when you need it.  We recommend signing up for the patient portal called "MyChart".  Sign up  information is provided on this After Visit Summary.  MyChart is used to connect with patients for Virtual Visits (Telemedicine).  Patients are able to view lab/test results, encounter notes, upcoming appointments, etc.  Non-urgent messages can be sent to your provider as well.   To learn more about what you can do with MyChart, go to [email or web address].    Your next appointment:   12 month(s)  The format for your next appointment:   In Person  Provider:   Sinclair Grooms, MD  or Ermalinda Barrios, PA-C         Other Instructions Low-Sodium Eating Plan Sodium, which is an element that makes up salt, helps you maintain a healthy balance of fluids in your body. Too much sodium can increase your blood pressure and cause fluid and waste to be held in your body. Your health care provider or dietitian may recommend following this plan if you have high blood pressure (hypertension), kidney disease, liver disease, or heart failure. Eating less sodium can help lower your blood pressure, reduce swelling, and protect your heart, liver, and kidneys. What are tips for following this plan? Reading food labels The Nutrition Facts label lists the amount of sodium in one serving of the food. If you eat more than one serving, you must multiply the listed amount of sodium by the number of servings. Choose foods with less than 140 mg of sodium per serving. Avoid foods with 300 mg of sodium or more per serving. Shopping  Look for lower-sodium products, often labeled as "low-sodium"  or "no salt added." Always check the sodium content, even if foods are labeled as "unsalted" or "no salt added." Buy fresh foods. Avoid canned foods and pre-made or frozen meals. Avoid canned, cured, or processed meats. Buy breads that have less than 80 mg of sodium per slice. Cooking  Eat more home-cooked food and less restaurant, buffet, and fast food. Avoid adding salt when cooking. Use salt-free seasonings or herbs  instead of table salt or sea salt. Check with your health care provider or pharmacist before using salt substitutes. Cook with plant-based oils, such as canola, sunflower, or olive oil. Meal planning When eating at a restaurant, ask that your food be prepared with less salt or no salt, if possible. Avoid dishes labeled as brined, pickled, cured, smoked, or made with soy sauce, miso, or teriyaki sauce. Avoid foods that contain MSG (monosodium glutamate). MSG is sometimes added to [place] food, bouillon, and some canned foods. Make meals that can be grilled, baked, poached, roasted, or steamed. These are generally made with less sodium. General information Most people on this plan should limit their sodium intake to 1,500-2,000 mg (milligrams) of sodium each day. What foods should I eat? Fruits Fresh, frozen, or canned fruit. Fruit juice. Vegetables Fresh or frozen vegetables. "No salt added" canned vegetables. "No salt added" tomato sauce and paste. Low-sodium or reduced-sodium tomato and vegetable juice. Grains Low-sodium cereals, including oats, puffed wheat and rice, and shredded wheat. Low-sodium crackers. Unsalted rice. Unsalted pasta. Low-sodium bread. Whole-grain breads and whole-grain pasta. Meats and other proteins Fresh or frozen (no salt added) meat, poultry, seafood, and fish. Low-sodium canned tuna and salmon. Unsalted nuts. Dried peas, beans, and lentils without added salt. Unsalted canned beans. Eggs. Unsalted nut butters. Dairy Milk. Soy milk. Cheese that is naturally low in sodium, such as ricotta cheese, fresh mozzarella, or Swiss cheese. Low-sodium or reduced-sodium cheese. Cream cheese. Yogurt. Seasonings and condiments Fresh and dried herbs and spices. Salt-free seasonings. Low-sodium mustard and ketchup. Sodium-free salad dressing. Sodium-free light mayonnaise. Fresh or refrigerated horseradish. Lemon juice. Vinegar. Other foods Homemade, reduced-sodium, or low-sodium  soups. Unsalted popcorn and pretzels. Low-salt or salt-free chips. The items listed above may not be a complete list of foods and beverages you can eat. Contact a dietitian for more information. What foods should I avoid? Vegetables Sauerkraut, pickled vegetables, and relishes. Olives. [place] fries. Onion rings. Regular canned vegetables (not low-sodium or reduced-sodium). Regular canned tomato sauce and paste (not low-sodium or reduced-sodium). Regular tomato and vegetable juice (not low-sodium or reduced-sodium). Frozen vegetables in sauces. Grains Instant hot cereals. Bread stuffing, pancake, and biscuit mixes. Croutons. Seasoned rice or pasta mixes. Noodle soup cups. Boxed or frozen macaroni and cheese. Regular salted crackers. Self-rising flour. Meats and other proteins Meat or fish that is salted, canned, smoked, spiced, or pickled. Precooked or cured meat, such as sausages or meat loaves. Berniece Salines. Ham. Pepperoni. Hot dogs. Corned beef. Chipped beef. Salt pork. Jerky. Pickled herring. Anchovies and sardines. Regular canned tuna. Salted nuts. Dairy Processed cheese and cheese spreads. Hard cheeses. Cheese curds. Blue cheese. Feta cheese. String cheese. Regular cottage cheese. Buttermilk. Canned milk. Fats and oils Salted butter. Regular margarine. Ghee. Bacon fat. Seasonings and condiments Onion salt, garlic salt, seasoned salt, table salt, and sea salt. Canned and packaged gravies. Worcestershire sauce. Tartar sauce. Barbecue sauce. Teriyaki sauce. Soy sauce, including reduced-sodium. Steak sauce. Fish sauce. Oyster sauce. Cocktail sauce. Horseradish that you find on the shelf. Regular ketchup and mustard. Meat flavorings and tenderizers. Bouillon  cubes. Hot sauce. Pre-made or packaged marinades. Pre-made or packaged taco seasonings. Relishes. Regular salad dressings. Salsa. Other foods Salted popcorn and pretzels. Corn chips and puffs. Potato and tortilla chips. Canned or dried soups. Pizza.  Frozen entrees and pot pies. The items listed above may not be a complete list of foods and beverages you should avoid. Contact a dietitian for more information. Summary Eating less sodium can help lower your blood pressure, reduce swelling, and protect your heart, liver, and kidneys. Most people on this plan should limit their sodium intake to 1,500-2,000 mg (milligrams) of sodium each day. Canned, boxed, and frozen foods are high in sodium. Restaurant foods, fast foods, and pizza are also very high in sodium. You also get sodium by adding salt to food. Try to cook at home, eat more fresh fruits and vegetables, and eat less fast food and canned, processed, or prepared foods. This information is not intended to replace advice given to you by your health care provider. Make sure you discuss any questions you have with your health care provider. Document Revised: 09/16/2019 Document Reviewed: 07/13/2019 Elsevier Patient Education  2022 [place], Ermalinda Barrios, [place]  10/08/2021 10:53 AM    [facility] [place], [place], [place]  [address] Phone: [phone]; Fax: [phone]

## 2021-10-01 ENCOUNTER — Other Ambulatory Visit: Payer: Self-pay

## 2021-10-01 ENCOUNTER — Ambulatory Visit: Payer: Medicare Other | Attending: Internal Medicine | Admitting: Physical Therapy

## 2021-10-01 ENCOUNTER — Encounter: Payer: Self-pay | Admitting: Physical Therapy

## 2021-10-01 DIAGNOSIS — M79604 Pain in right leg: Secondary | ICD-10-CM | POA: Insufficient documentation

## 2021-10-01 DIAGNOSIS — M25511 Pain in right shoulder: Secondary | ICD-10-CM | POA: Insufficient documentation

## 2021-10-01 DIAGNOSIS — G8929 Other chronic pain: Secondary | ICD-10-CM | POA: Diagnosis not present

## 2021-10-01 DIAGNOSIS — M79605 Pain in left leg: Secondary | ICD-10-CM | POA: Diagnosis not present

## 2021-10-01 DIAGNOSIS — R296 Repeated falls: Secondary | ICD-10-CM | POA: Diagnosis not present

## 2021-10-01 DIAGNOSIS — M6281 Muscle weakness (generalized): Secondary | ICD-10-CM | POA: Insufficient documentation

## 2021-10-01 NOTE — Patient Instructions (Signed)
Access Code: [email or web address] URL: [email or web address] Date: 10/01/2021 Prepared by: Voncille Lo  Program Notes sit to stand as movement snack before you  eat  breakfast , lunch and dinner   Exercises Sit to Stand with Counter Support - 3 x daily - 7 x weekly - 1 sets - 10 reps Supine Lower Trunk Rotation - 1 x daily - 7 x weekly - 3 sets - 10 reps   Voncille Lo, PT, [facility] Certified Exercise Expert for the Aging Adult  10/01/21 3:44 PM Phone: [phone] Fax: [phone]

## 2021-10-01 NOTE — Therapy (Signed)
OUTPATIENT PHYSICAL THERAPY  SHOULDER/THORACOLUMBAR EVALUATION   Patient Name: Luis Carter MRN: [id number] DOB:[birth year], 76 y.o., male Today's Date: 10/01/2021   PT End of Session - 10/01/21 1445     Visit Number 1    Number of Visits 16    Date for PT Re-Evaluation 01/21/22    Authorization Type [employer] MCR/MCD    PT Start Time 1145    PT Stop Time 1240    PT Time Calculation (min) 55 min    Activity Tolerance Patient tolerated treatment well    Behavior During Therapy WFL for tasks assessed/performed             Past Medical History:  Diagnosis Date   Arthritis    "left arm/shoulder; both legs" (12/12/2015)   ASTHMA    since childhood   Cardiomyopathy ([place]) 01/25/2016   Echo 12/13/15 - Mild LVH, EF 45-50%, normal wall motion, grade 1 diastolic dysfunction, mild LAE, mild RVE, mild RAE   COPD 04/14/2007   Coronary artery disease    DIABETES MELLITUS, TYPE II 04/14/2007   GERD (gastroesophageal reflux disease)    GI bleed    Gout    High cholesterol    History of nuclear stress test    a.  Myoview 6/17: EF 54%, no ischemia   HYPERTENSION 04/14/2007   LEG PAIN, RIGHT 05/19/2008   LOW BACK PAIN 04/14/2007   Myocardial infarction ([place])    ~ 2001/notes 09/13/2001 (03/22/2013)   PANCREATITIS, HX OF 04/14/2007   Pneumonia    "twice" (12/12/2015)   SEIZURE DISORDER 04/14/2007   "used to have them when I was young" (03/22/2013)   Shortness of breath    "related to asthma" (03/22/2013)   Past Surgical History:  Procedure Laterality Date   CORONARY ANGIOPLASTY WITH STENT PLACEMENT     ESOPHAGOGASTRODUODENOSCOPY  12/21/2011   Procedure: ESOPHAGOGASTRODUODENOSCOPY (EGD);  Surgeon: Inda Castle, MD;  Location: [facility];  Service: Endoscopy;  Laterality: N/A;   Patient Active Problem List   Diagnosis Date Noted   Physical debility 09/17/2021   Acute right hip pain 09/16/2021   HLD (hyperlipidemia) 09/15/2021   COVID-19 virus infection 09/15/2021   Acute exacerbation of  chronic obstructive pulmonary disease (COPD) ([place]) 03/11/2021   SIRS (systemic inflammatory response syndrome) ([place]) 03/11/2021   Malnutrition of moderate degree 02/11/2021   Hyperkalemia 02/10/2021   Dyspnea 02/08/2021   Community acquired pneumonia of right upper lobe of lung    Respiratory failure with hypoxia and hypercapnia ([place]) 10/11/2018   Acute pain of left shoulder 10/15/2016   Pain in right leg 10/15/2016   Weakness of right leg 10/15/2016   Cardiomyopathy ([place]) 01/25/2016   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, stage 3b ([place]) 12/13/2015   Abnormal echocardiogram 12/13/2015   Syncope 12/12/2015   Tobacco use disorder 09/07/2015   Gout 01/27/2012   Cocaine use 12/22/2011   Acute chest pain 12/18/2008   Diabetes mellitus with renal complications ([place]) [id number]   Hypertension 04/14/2007   Asthma 04/14/2007   COPD (chronic obstructive pulmonary disease) ([place]) 04/14/2007   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   Seizures ([place]) 04/14/2007   PANCREATITIS, HX OF 04/14/2007    PCP: Nolene Ebbs, MD  REFERRING PROVIDER: Jonetta Osgood, MD  REFERRING DIAG:  R53.81 (ICD-10-CM) - Physical debility  M25.551 (ICD-10-CM) - Acute right hip pain  R29.898 (ICD-10-CM) - Weakness of right leg  M79.604 (ICD-10-CM) - Pain in right leg  M25.512 (ICD-10-CM) - Acute pain of left shoulder  THERAPY DIAG:  Pain in right leg - Plan: PT plan of care cert/re-cert  Pain in left leg - Plan: PT plan of care cert/re-cert  Repeated falls - Plan: PT plan of care cert/re-cert  Chronic right shoulder pain - Plan: PT plan of care cert/re-cert  Muscle weakness (generalized) - Plan: PT plan of care cert/re-cert  ONSET DATE:  Pt with years of back/Leg pain weakness.   Chronic history of Lumbar stenosis  SUBJECTIVE:                                                                                                                                                                                            SUBJECTIVE STATEMENT:  Sister- Abdulrhman Avino present with pt during eval. I am coming to physical therapy because of my legs  They are weak. I fell about 3 months ago and I have had pain ever since in both legs, especially at night lying in bed and especially when I try to get up. Pt has recently been to ED for chest pain and R hip pain that prevented him from performing safe transfers. PERTINENT HISTORY:   HTN, HLD, CKD 3B, DM2, gout, COPD, prior cocaine use. Back pain since2008, leg pain Right since 2009 MI, pacreatitis. Chronic hx of lumbar stenosis. See extensive medical history  PAIN:  Are you having pain? Yes NPRS scale: 8/10 even at rest in legs,  at worst when trying ot get up out of bed or car 10/10 Back 0/10  at worst 8/10 getting out of bed shoulder Pain location:  bil leg mostly knees and lateral hips, R shoulder, low back medial Pain orientation: Bilateral legs, low back medial  R shld PAIN TYPE: aching, sharp, and tight Pain description: intermittent  Aggravating factors: hard to get in and out of the bath, difficult walking for longer than 10 min, transfers challenging Relieving factors: resting.  PRECAUTIONS: Fall  WEIGHT BEARING RESTRICTIONS No  FALLS:  Has patient fallen in last 6 months? Yes, Number of falls: 2 one time walking to car,  and then the second missed a step  LIVING ENVIRONMENT: Lives with: lives with their family sister/her husband and niece Lives in: House/apartment Stairs: Yes; Internal: 12 steps; on right going up and External: 2 steps; none Has following equipment at home: Single point cane/ walker ,   OCCUPATION: retired  PLOF: Independent with household mobility with device for a year  PATIENT GOALS I want to get stronger in my legs   OBJECTIVE:   DIAGNOSTIC FINDINGS:  09-18-21 No signs of fracture or dislocation. Mild degenerative changes about the LEFT and RIGHT hip. Degenerative  changes in the lumbar spine. Soft  tissues are grossly normal.   IMPRESSION: Mild degenerative changes without acute osseous abnormality.  PATIENT SURVEYS:  FOTO back 51%  predicted 54%   balance 44% predicted 51%   SCREENING FOR RED FLAGS: Bowel or bladder incontinence: No   COGNITION:  Overall cognitive status: Within functional limits for tasks assessed     SENSATION:  Light touch: Appears intact  Stereognosis: Appears intact  Hot/Cold: Appears intact  Proprioception: Appears intact  MUSCLE LENGTH: Hamstrings: Right 50 deg; Left 54 deg Thomas test: bil hip flexor tightness  POSTURE:  Pt with flexed posture, rounded shoulders and forward neck  PALPATION: Pt with bil lumbar tightness, TTP bil hip post to greater troch, No TTP for R shoulder but limited range, Pt with complaint of bil knees but minimal tenderness to palpation on knees globally  LUMBARAROM/PROM  A/PROM A/PROM  10/01/2021  Flexion 50% fingertips to mid shin  Extension  10% pain with ext  Right lateral flexion 50%  Left lateral flexion 50%   Right rotation 75%  Left rotation 50%   (Blank rows = not tested)  LE AROM/PROM:    All WNL except noted A/PROM Right 10/01/2021 Left 10/01/2021  Hip flexion    Hip extension    Hip abduction    Hip adduction    Hip internal rotation 25 35  Hip external rotation 35 49  Knee flexion    Knee extension    Ankle dorsiflexion    Ankle plantarflexion    Ankle inversion    Ankle eversion     (Blank rows = not tested)  LE MMT:  MMT Right 10/01/2021 Left 10/01/2021  Hip flexion 4-/5 4-/5  Hip extension 4-/5 4-/5  Hip abduction 3+/5 3+/5  Hip adduction    Hip internal rotation    Hip external rotation    Knee flexion 4-/5 4/5  Knee extension 4-/5 4/5  Ankle dorsiflexion 4-/5 4-/5  Ankle plantarflexion    Ankle inversion    Ankle eversion     (Blank rows = not tested)  UPPER EXTREMITY AROM/PROM:  A/PROM Right 10/01/2021 Left 10/01/2021  Shoulder flexion 135 143  Shoulder extension     Shoulder abduction 130 140  Shoulder adduction    Shoulder internal rotation    Shoulder external rotation    Elbow flexion    Elbow extension    Wrist flexion    Wrist extension    Wrist ulnar deviation    Wrist radial deviation    Wrist pronation    Wrist supination    (Blank rows = not tested)  UPPER EXTREMITY MMT: Grossly 4 to 4+/5 throughout MMT Right 10/01/2021 Left 10/01/2021  Shoulder flexion    Shoulder extension    Shoulder abduction    Shoulder adduction    Middle trapezius    Lower trapezius    Elbow flexion    Elbow extension    Wrist flexion    Wrist extension    Wrist ulnar deviation    Wrist radial deviation    Wrist pronation    Wrist supination    Grip strength (lbs)    (Blank rows = not tested)   LUMBAR SPECIAL TESTS:  Straight leg raise test: Negative and Slump test: Negative  FUNCTIONAL TESTS:  30 sec STS  6 x must use hands  GAIT: Distance walked: 130 Assistive device utilized: Single point cane Level of assistance: SBA Comments: 2.01 ft/sec  BERG BALANCE TEST Sitting to Standing: 3.  Stands independently using hands Standing Unsupported: 3.      Stands 2 minutes with supervision Sitting Unsupported: 4.     Sits for 2 minutes independently Standing to Sitting: 3.     Controls descent with hands  Transfers: 3.     Transfers safely definite use of hands Standing with eyes closed: 3.     Stands 10 seconds with supervision Standing with feet together: 3.     Stands for 1 minute with supervision Reaching forward with outstretched arm: 3.     Reaches forward 5 inches Retrieving object from the floor: 3.     Able to pick up with supervision Turning to look behind: 2.     Turns sideways only, maintains balance Turning 360 degrees: 2.     Able to turn slowly, but safely Place alternate foot on stool: 2.     4 steps without assistance/supervision Standing with one foot in front: 1.     Needs help to step, but can hold for 15  seconds Standing on one foot: 1.     Holds <3 seconds  Total Score: 36/56   TODAY'S TREATMENT  Initial HEP. Movement snack squats 3 x a day  trunk rotation   PATIENT EDUCATION:  Education details: POC Explanation of findings.  BERG 36/56, need for walker full time until stronger Person educated: Patient and sister of pt Treg Nanny Education method: Explanation, Demonstration, Tactile cues, Verbal cues, and Handouts Education comprehension: verbalized understanding, returned demonstration, verbal cues required, and needs further education   HOME EXERCISE PROGRAM: Access Code: [email or web address] URL: [email or web address] Date: 10/01/2021 Prepared by: Voncille Lo  Program Notes sit to stand as movement snack before you  eat  breakfast , lunch and dinner   Exercises Sit to Stand with Counter Support - 3 x daily - 7 x weekly - 1 sets - 10 reps Supine Lower Trunk Rotation - 1 x daily - 7 x weekly - 3 sets - 10 reps   ASSESSMENT:  CLINICAL IMPRESSION: Patient is a 76 y.o.  with multiple medical issues including cardiac significant of who was seen today for physical therapy evaluation and treatment for physical debility, bil LE pain , R shld pain and balance issues and falls x 2 in last 6 months. Pt BERG 36/56 with need for full time walker use at time of eval. Pt educated on importance Objective impairments include cardiopulmonary status limiting activity, decreased activity tolerance, decreased balance, decreased endurance, decreased knowledge of use of DME, decreased mobility, difficulty walking, decreased ROM, decreased strength, decreased safety awareness, hypomobility, impaired flexibility, impaired UE functional use, improper body mechanics, postural dysfunction, and pain. These impairments are limiting patient from  ADL's safe transfer and ambulation . Personal factors including  HTN, HLD, CKD 3B, DM2, gout, COPD Chronic hx of lumbar stenosis. are also affecting  patient's functional outcome. Patient will benefit from skilled PT to address above impairments and improve overall function.  REHAB POTENTIAL: Fair Fair  CLINICAL DECISION MAKING: Evolving/moderate complexity  EVALUATION COMPLEXITY: Moderate   GOALS: Goals reviewed with patient? Yes  SHORT TERM GOALS:  STG Name Target Date Goal status  1 Pt independent with intial HEP Baseline:  10/29/2021 INITIAL  2 Demonstrate symmetry with gait on level surface with use of a cane on the LT Baseline: Pt with decreased stance length on R and wide based gait with trendelenberg gait 10/29/2021 INITIAL  3 Sit and stand with RT=LT wt bearing to reduce lumbar strain with transfers Baseline:Pt bears weight  to left with R LE more anterior to bear wt in L LE 10/29/2021 INITIAL  4 Pt will be able to perform 5 x STS without use of UE Baseline: 30 sec STS with heavy dependence of bil UE 10/29/2021 INITIAL  5 Pt/sister will be educated on Fall prevention and given handout tools to prepare home for decrease risk of fall Baseline: limited knowledge 10/29/2021 INITIAL   LONG TERM GOALS:   LTG Name Target Date Goal status  1 Pt will be independent with advanced HEP.  Baseline: no knowledge 11/26/2021 INITIAL  2 Sit and stand with RT=LT wt bearing to reduce lumbar strain and allow for increased tolerance for these positions for work tasks Baseline: wt bears to Left 11/26/2021 INITIAL  3 FOTO will improve balance from  44%   to  51%   indicating improved functional mobility .  Baseline: eval 44% balance 11/26/2021 INITIAL  4 Pt will improve L/R knee extensor/flexor strength to >/= 4+/5 with </= 2/10 pain to promote safety with walking/standing activities Baseline: Pt with 4-/5 bil LE strength or less See flow sheet 11/26/2021 INITIAL  5 Pt will demonstrate/simulate ability transfer in and out of bathtub with safety Baseline: Pt with difficulty bathing due to pain in bil legs 11/26/2021 INITIAL  6 Pt will walk for 750 feet with  LRAD without excessie fatigue Baseline: 6 MWT TBA 11/26/2021 INITIAL  7 Pt will be able to negotiate stairs without exacerbating pain. Baseline:  Pt with pain up to 8/10 on steps 11/26/2021 INITIAL   PLAN: PT FREQUENCY: 2x/week  PT DURATION: 8 weeks  PLANNED INTERVENTIONS: Therapeutic exercises, Therapeutic activity, Neuro Muscular re-education, Balance training, Gait training, Patient/Family education, Joint mobilization, Stair training, DME instructions, Dry Needling, Electrical stimulation, Spinal mobilization, Cryotherapy, Moist heat, Taping, Ionotophoresis 4mg /ml Dexamethasone, and Manual therapy  PLAN FOR NEXT SESSION: Gait training with walker, Fall prevention and issue HEP   Voncille Lo, PT, [place] Certified Exercise Expert for the Aging Adult  10/01/21 4:56 PM Phone: [phone] Fax: [phone]

## 2021-10-02 DIAGNOSIS — M179 Osteoarthritis of knee, unspecified: Secondary | ICD-10-CM | POA: Diagnosis not present

## 2021-10-02 DIAGNOSIS — R0789 Other chest pain: Secondary | ICD-10-CM | POA: Diagnosis not present

## 2021-10-02 DIAGNOSIS — N1832 Chronic kidney disease, stage 3b: Secondary | ICD-10-CM | POA: Diagnosis not present

## 2021-10-02 DIAGNOSIS — J449 Chronic obstructive pulmonary disease, unspecified: Secondary | ICD-10-CM | POA: Diagnosis not present

## 2021-10-02 DIAGNOSIS — E1142 Type 2 diabetes mellitus with diabetic polyneuropathy: Secondary | ICD-10-CM | POA: Diagnosis not present

## 2021-10-02 DIAGNOSIS — U071 COVID-19: Secondary | ICD-10-CM | POA: Diagnosis not present

## 2021-10-02 DIAGNOSIS — I10 Essential (primary) hypertension: Secondary | ICD-10-CM | POA: Diagnosis not present

## 2021-10-07 NOTE — Therapy (Signed)
OUTPATIENT PHYSICAL THERAPY TREATMENT NOTE   Patient Name: Luis Carter MRN: [id number] DOB:1946-08-01, 76 y.o., male Today's Date: 10/08/2021  PCP: Nolene Ebbs, MD REFERRING PROVIDER: Nolene Ebbs, MD   PT End of Session - 10/08/21 0756     Visit Number 2    Number of Visits 16    Date for PT Re-Evaluation 01/21/22    Authorization Type UHC MCR/MCD    PT Start Time 0800    Activity Tolerance Patient tolerated treatment well    Behavior During Therapy [facility] for tasks assessed/performed             Past Medical History:  Diagnosis Date   Arthritis    "left arm/shoulder; both legs" (12/12/2015)   ASTHMA    since childhood   Cardiomyopathy ([place]) 01/25/2016   Echo 12/13/15 - Mild LVH, EF 45-50%, normal wall motion, grade 1 diastolic dysfunction, mild LAE, mild RVE, mild RAE   COPD 04/14/2007   Coronary artery disease    DIABETES MELLITUS, TYPE II 04/14/2007   GERD (gastroesophageal reflux disease)    GI bleed    Gout    High cholesterol    History of nuclear stress test    a.  Myoview 6/17: EF 54%, no ischemia   HYPERTENSION 04/14/2007   LEG PAIN, RIGHT 05/19/2008   LOW BACK PAIN 04/14/2007   Myocardial infarction ([place])    ~ 2001/notes 09/13/2001 (03/22/2013)   PANCREATITIS, HX OF 04/14/2007   Pneumonia    "twice" (12/12/2015)   SEIZURE DISORDER 04/14/2007   "used to have them when I was young" (03/22/2013)   Shortness of breath    "related to asthma" (03/22/2013)   Past Surgical History:  Procedure Laterality Date   CORONARY ANGIOPLASTY WITH STENT PLACEMENT     ESOPHAGOGASTRODUODENOSCOPY  12/21/2011   Procedure: ESOPHAGOGASTRODUODENOSCOPY (EGD);  Surgeon: Inda Castle, MD;  Location: [place];  Service: Endoscopy;  Laterality: N/A;   Patient Active Problem List   Diagnosis Date Noted   Physical debility 09/17/2021   Acute right hip pain 09/16/2021   HLD (hyperlipidemia) 09/15/2021   COVID-19 virus infection 09/15/2021   Acute exacerbation of chronic  obstructive pulmonary disease (COPD) ([place]) 03/11/2021   SIRS (systemic inflammatory response syndrome) ([place]) 03/11/2021   Malnutrition of moderate degree 02/11/2021   Hyperkalemia 02/10/2021   Dyspnea 02/08/2021   Community acquired pneumonia of right upper lobe of lung    Respiratory failure with hypoxia and hypercapnia ([place]) 10/11/2018   Acute pain of left shoulder 10/15/2016   Pain in right leg 10/15/2016   Weakness of right leg 10/15/2016   Cardiomyopathy ([place]) 01/25/2016   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, stage 3b ([place]) 12/13/2015   Abnormal echocardiogram 12/13/2015   Syncope 12/12/2015   Tobacco use disorder 09/07/2015   Gout 01/27/2012   Cocaine use 12/22/2011   Acute chest pain 12/18/2008   Diabetes mellitus with renal complications ([place]) [id number]   Hypertension 04/14/2007   Asthma 04/14/2007   COPD (chronic obstructive pulmonary disease) ([place]) 04/14/2007   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 Back pain since2008, leg pain Right since 2009 MI, pacreatitis. Chronic hx of lumbar stenosis. See extensive medical history  PRECAUTIONS: Fall  SUBJECTIVE: I tried to do my exercises. I have a walker at home but I sit most of the time at home  PAIN:  Are you having pain? Yes NPRS scale: 1/10 in back  pain in bil knees 8/10 after walking  5/10 in knees Pain location:  mostly knees today, bil leg mostly knees and lateral hips, R shoulder, low back  medial Pain orientation: Bilateral legs, low back medial  R shld PAIN TYPE: aching, sharp, and tight Pain description: intermittent  Aggravating factors: hard to get in and out of the bath, difficult walking for longer than 10 min, transfers challenging Relieving factors: resting.     OBJECTIVE:    DIAGNOSTIC FINDINGS:  09-18-21 No signs of fracture or dislocation. Mild degenerative changes about the LEFT and RIGHT hip. Degenerative changes in the lumbar spine. Soft tissues are grossly normal.   IMPRESSION: Mild degenerative changes without acute osseous abnormality.   PATIENT SURVEYS:  FOTO back 51%  predicted 54%   balance 44% predicted 51%    SCREENING FOR RED FLAGS: Bowel or bladder incontinence: No     COGNITION:          Overall cognitive status: Within functional limits for tasks assessed                        SENSATION:          Light touch: Appears intact          Stereognosis: Appears intact          Hot/Cold: Appears intact          Proprioception: Appears intact   MUSCLE LENGTH: Hamstrings: Right 50 deg; Left 54 deg Thomas test: bil hip flexor tightness   POSTURE:  Pt with flexed posture, rounded shoulders and forward neck   PALPATION: Pt with bil lumbar tightness, TTP bil hip post to greater troch, No TTP for R shoulder but limited range, Pt with complaint of bil knees but minimal tenderness to palpation on knees globally   LUMBARAROM/PROM   A/PROM A/PROM  10/01/2021  Flexion 50% fingertips to mid shin  Extension  10% pain with ext  Right lateral flexion 50%  Left lateral flexion 50%   Right rotation 75%  Left rotation 50%   (Blank rows = not tested)   LE AROM/PROM:                               All WNL except noted A/PROM Right 10/01/2021 Left 10/01/2021  Hip flexion      Hip extension      Hip abduction      Hip adduction      Hip internal rotation 25 35  Hip external rotation 35 49  Knee flexion      Knee extension      Ankle  dorsiflexion      Ankle plantarflexion      Ankle inversion      Ankle eversion       (Blank rows = not tested)   LE MMT:   MMT Right 10/01/2021 Left 10/01/2021  Hip flexion 4-/5 4-/5  Hip extension 4-/5 4-/5  Hip abduction 3+/5 3+/5  Hip adduction      Hip internal rotation      Hip external rotation      Knee flexion 4-/5 4/5  Knee extension 4-/5 4/5  Ankle dorsiflexion 4-/5 4-/5  Ankle plantarflexion      Ankle inversion      Ankle eversion       (Blank rows = not tested)   UPPER EXTREMITY AROM/PROM:   A/PROM Right 10/01/2021 Left 10/01/2021  Shoulder flexion 135 143  Shoulder extension      Shoulder abduction 130 140  Shoulder adduction      Shoulder internal rotation      Shoulder external rotation      Elbow flexion      Elbow extension      Wrist flexion      Wrist extension      Wrist ulnar deviation      Wrist radial deviation      Wrist pronation      Wrist supination      (Blank rows = not tested)   UPPER EXTREMITY MMT: Grossly 4 to 4+/5 throughout MMT Right 10/01/2021 Left 10/01/2021  Shoulder flexion      Shoulder extension      Shoulder abduction      Shoulder adduction      Middle trapezius      Lower trapezius      Elbow flexion      Elbow extension      Wrist flexion      Wrist extension      Wrist ulnar deviation      Wrist radial deviation      Wrist pronation      Wrist supination      Grip strength (lbs)      (Blank rows = not tested)     LUMBAR SPECIAL TESTS:  Straight leg raise test: Negative and Slump test: Negative   FUNCTIONAL TESTS:  30 sec STS  6 x must use hands   GAIT: Distance walked: 130 Assistive device utilized: Single point cane Level of assistance: SBA Comments: 2.01 ft/sec   BERG BALANCE TEST Sitting to Standing: 3.      Stands independently using hands Standing Unsupported: 3.      Stands 2 minutes with supervision Sitting Unsupported: 4.     Sits for 2 minutes independently Standing to Sitting: 3.      Controls descent with hands  Transfers: 3.     Transfers safely definite use of hands Standing with eyes closed: 3.     Stands 10 seconds with supervision Standing with feet together: 3.     Stands for 1 minute with supervision Reaching forward with outstretched arm: 3.     Reaches forward 5 inches Retrieving object from the floor: 3.     Able to pick up with supervision Turning to look behind: 2.     Turns sideways only, maintains balance Turning 360 degrees: 2.     Able to turn slowly, but safely Place alternate foot on stool: 2.     4 steps without assistance/supervision Standing with one foot in front: 1.     Needs help to step, but can hold for 15 seconds Standing on one foot: 1.     Holds <3 seconds   Total Score: 36/56    TODAY'S TREATMENT   [facility] Adult PT Treatment:  DATE: 10-08-21 Therapeutic Exercise:    Sit to Stand with Counter Support - 2 x 10 reps Standing UE wall slide with hand cloths 2 x 10 and then added hip extension 1 x 10 R and L Standing march at counter 2 x 10  Supine Lower Trunk Rotation -5  reps to R and L 20 sec each Seated Hamstring Stretch - 1-2 x daily - 7 x weekly - 1 sets - 2-3 reps - 30 hold needs VC and TC for correct execution Bridge 1 x 10 1/2 range  end range hold 3 sec  irritated back then [facility] R and L hold for 10 sec each x 3  SLR 1 x 10 Sitting flexion stretch, hands down legs to ankle and hold for back stretch 10 -15 sec x 3 each   Gait - 6MWT 599 ft     Gait trained on rolling walker.  Pt tends to cruise walls with cane and decreased stride length and wide based gait.  Pt educated on stair climbing using cane - pt uncoordinated with cane use and recommend walker for safety full time at this time.    Self Care: Fall prevention handout discussed with sister need for lights in bedroom and on steps as needed , gave handout   Eval -Initial HEP. Movement snack squats 3 x a day  trunk rotation      PATIENT EDUCATION:  Education details: POC Explanation of findings.  BERG 36/56, need for walker full time until stronger, fall prevention handout and discussion. Emphasis on need for full time walker since pt cruises wall while using cane, Initial HEP issued Person educated: Patient and sister of pt Ryley Bayerl Education method: Explanation, Demonstration, Tactile cues, Verbal cues, and Handouts Education comprehension: verbalized understanding, returned demonstration, verbal cues required, and needs further education     HOME EXERCISE PROGRAM: Access Code: [email or web address] URL: [email or web address] Date: 10/08/2021 Prepared by: Voncille Lo  Program Notes sit to stand as movement snack before you  eat  breakfast , lunch and dinner Standing UE wall slide and added hip extension as able  Exercises Sit to Stand with Counter Support - 3 x daily - 7 x weekly - 1 sets - 10 reps Supine Lower Trunk Rotation - 1 x daily - 7 x weekly - 3 sets - 10 reps Seated Hamstring Stretch - 1-2 x daily - 7 x weekly - 1 sets - 2-3 reps - 30 hold Standing March with Counter Support - 1 x daily - 7 x weekly - 2 sets - 10 reps      ASSESSMENT:   CLINICAL IMPRESSION: Mr Casini enters clinic using [facility] (although told to use his walker)  Pt unsteady and uses available R hand to hold onto wall as well as use cane in left hand.  Pt 6 MWT 599 ft with walker.  Pt had pain in back 1/10 and 8/10 pain in bil knees but after walking for 6 min he had 0/10 pain and 5/10 knee pain.  Emphasis on need for walking and moving throughout the day.  Pt admits he sits most of day in recliner. Initial HEP issued and Fall prevention hand out discussed with pt and sister who assists him at home and drives him to appt.  Pt walks with flexed posture and PPT.  Will benefit from continued skilled PT for strengthening and fall prevention    Objective impairments include cardiopulmonary status limiting activity,  decreased activity tolerance, decreased balance, decreased endurance, decreased knowledge of  use of DME, decreased mobility, difficulty walking, decreased ROM, decreased strength, decreased safety awareness, hypomobility, impaired flexibility, impaired UE functional use, improper body mechanics, postural dysfunction, and pain. These impairments are limiting patient from  ADL's safe transfer and ambulation . Personal factors including  HTN, HLD, CKD 3B, DM2, gout, COPD Chronic hx of lumbar stenosis. are also affecting patient's functional outcome. Patient will benefit from skilled PT to address above impairments and improve overall function.   REHAB POTENTIAL: Fair Fair   CLINICAL DECISION MAKING: Evolving/moderate complexity   EVALUATION COMPLEXITY: Moderate     GOALS: Goals reviewed with patient? Yes   SHORT TERM GOALS:   STG Name Target Date Goal status  1 Pt independent with intial HEP Baseline:  10/29/2021 INITIAL  2 Demonstrate symmetry with gait on level surface with use of a cane on the LT Baseline: Pt with decreased stance length on R and wide based gait with trendelenberg gait 10/29/2021 INITIAL  3 Sit and stand with RT=LT wt bearing to reduce lumbar strain with transfers Baseline:Pt bears weight to left with R LE more anterior to bear wt in L LE 10/29/2021 INITIAL  4 Pt will be able to perform 5 x STS without use of UE Baseline: 30 sec STS with heavy dependence of bil UE 10/29/2021 INITIAL  5 Pt/sister will be educated on Fall prevention and given handout tools to prepare home for decrease risk of fall Baseline: limited knowledge 10/29/2021 INITIAL    LONG TERM GOALS:    LTG Name Target Date Goal status  1 Pt will be independent with advanced HEP.  Baseline: no knowledge 11/26/2021 INITIAL  2 Sit and stand with RT=LT wt bearing to reduce lumbar strain and allow for increased tolerance for these positions for work tasks Baseline: wt bears to Left 11/26/2021 INITIAL  3 FOTO will improve  balance from  44%   to  51%   indicating improved functional mobility .  Baseline: eval 44% balance 11/26/2021 INITIAL  4 Pt will improve L/R knee extensor/flexor strength to >/= 4+/5 with </= 2/10 pain to promote safety with walking/standing activities Baseline: Pt with 4-/5 bil LE strength or less See flow sheet 11/26/2021 INITIAL  5 Pt will demonstrate/simulate ability transfer in and out of bathtub with safety Baseline: Pt with difficulty bathing due to pain in bil legs 11/26/2021 INITIAL  6 Pt will walk for 750 feet with LRAD without excessie fatigue Baseline: 6 MWT TBA 11/26/2021 INITIAL  7 Pt will be able to negotiate stairs without exacerbating pain. Baseline:  Pt with pain up to 8/10 on steps 11/26/2021 INITIAL    PLAN: PT FREQUENCY: 2x/week   PT DURATION: 8 weeks   PLANNED INTERVENTIONS: Therapeutic exercises, Therapeutic activity, Neuro Muscular re-education, Balance training, Gait training, Patient/Family education, Joint mobilization, Stair training, DME instructions, Dry Needling, Electrical stimulation, Spinal mobilization, Cryotherapy, Moist heat, Taping, Ionotophoresis 4mg /ml Dexamethasone, and Manual therapy   PLAN FOR NEXT SESSION:  Gait training review with walker,  continue strengthening of bil LE for fall prevention and safe ambulation.  Pt would like to eventually use a cane full time but for now BERG was 39/56 and needs walker due to holding onto walls while entering clinic     Voncille Lo, [place], [facility] Certified Exercise Expert for the Aging Adult  10/01/21 4:56 PM Phone: [phone] Fax: [phone]    Voncille Lo, [place], [place] Certified Exercise Expert for the Aging Adult  10/08/21 9:16 AM Phone: [phone] Fax: [phone]

## 2021-10-08 ENCOUNTER — Other Ambulatory Visit: Payer: Self-pay

## 2021-10-08 ENCOUNTER — Ambulatory Visit (INDEPENDENT_AMBULATORY_CARE_PROVIDER_SITE_OTHER): Payer: Medicare Other | Admitting: Physician Assistant

## 2021-10-08 ENCOUNTER — Encounter: Payer: Self-pay | Admitting: Physician Assistant

## 2021-10-08 ENCOUNTER — Ambulatory Visit: Payer: Medicare Other | Admitting: Physical Therapy

## 2021-10-08 VITALS — BP 142/84 | HR 81

## 2021-10-08 VITALS — BP 132/76 | HR 74 | Ht 69.0 in | Wt 195.0 lb

## 2021-10-08 DIAGNOSIS — E785 Hyperlipidemia, unspecified: Secondary | ICD-10-CM | POA: Diagnosis not present

## 2021-10-08 DIAGNOSIS — M25511 Pain in right shoulder: Secondary | ICD-10-CM

## 2021-10-08 DIAGNOSIS — R296 Repeated falls: Secondary | ICD-10-CM

## 2021-10-08 DIAGNOSIS — I493 Ventricular premature depolarization: Secondary | ICD-10-CM

## 2021-10-08 DIAGNOSIS — M6281 Muscle weakness (generalized): Secondary | ICD-10-CM

## 2021-10-08 DIAGNOSIS — G8929 Other chronic pain: Secondary | ICD-10-CM | POA: Diagnosis not present

## 2021-10-08 DIAGNOSIS — R079 Chest pain, unspecified: Secondary | ICD-10-CM | POA: Diagnosis not present

## 2021-10-08 DIAGNOSIS — I10 Essential (primary) hypertension: Secondary | ICD-10-CM | POA: Diagnosis not present

## 2021-10-08 DIAGNOSIS — M79605 Pain in left leg: Secondary | ICD-10-CM

## 2021-10-08 DIAGNOSIS — N1832 Chronic kidney disease, stage 3b: Secondary | ICD-10-CM

## 2021-10-08 DIAGNOSIS — M79604 Pain in right leg: Secondary | ICD-10-CM

## 2021-10-08 DIAGNOSIS — I429 Cardiomyopathy, unspecified: Secondary | ICD-10-CM | POA: Diagnosis not present

## 2021-10-08 NOTE — Patient Instructions (Addendum)
Fall Prevention and Home Safety Falls cause injuries and can affect all age groups. It is possible to use preventive measures to significantly decrease the likelihood of falls. There are many simple measures which can make your home safer and prevent falls. OUTDOORS Repair cracks and edges of walkways and driveways. Remove high doorway thresholds. Trim shrubbery on the main path into your home. Have good outside lighting. Clear walkways of tools, rocks, debris, and clutter. Check that handrails are not broken and are securely fastened. Both sides of steps should have handrails. Have leaves, snow, and ice cleared regularly. Use sand or salt on walkways during winter months. In the garage, clean up grease or oil spills. BATHROOM Install night lights. Install grab bars by the toilet and in the tub and shower. Use non-skid mats or decals in the tub or shower. Place a plastic non-slip stool in the shower to sit on, if needed. Keep floors dry and clean up all water on the floor immediately. Remove soap buildup in the tub or shower on a regular basis. Secure bath mats with non-slip, double-sided rug tape. Remove throw rugs and tripping hazards from the floors. BEDROOMS Install night lights. Make sure a bedside light is easy to reach. Do not use oversized bedding. Keep a telephone by your bedside. Have a firm chair with side arms to use for getting dressed. Remove throw rugs and tripping hazards from the floor. KITCHEN Keep handles on pots and pans turned toward the center of the stove. Use back burners when possible. Clean up spills quickly and allow time for drying. Avoid walking on wet floors. Avoid hot utensils and knives. Position shelves so they are not too high or low. Place commonly used objects within easy reach. If necessary, use a sturdy step stool with a grab bar when reaching. Keep electrical cables out of the way. Do not use floor polish or wax that makes floors slippery.  If you must use wax, use non-skid floor wax. Remove throw rugs and tripping hazards from the floor. STAIRWAYS Never leave objects on stairs. Place handrails on both sides of stairways and use them. Fix any loose handrails. Make sure handrails on both sides of the stairways are as long as the stairs. Check carpeting to make sure it is firmly attached along stairs. Make repairs to worn or loose carpet promptly. Avoid placing throw rugs at the top or bottom of stairways, or properly secure the rug with carpet tape to prevent slippage. Get rid of throw rugs, if possible. Have an electrician put in a light switch at the top and bottom of the stairs. OTHER FALL PREVENTION TIPS Wear low-heel or rubber-soled shoes that are supportive and fit well. Wear closed toe shoes. When using a stepladder, make sure it is fully opened and both spreaders are firmly locked. Do not climb a closed stepladder. Add color or contrast paint or tape to grab bars and handrails in your home. Place contrasting color strips on first and last steps. Learn and use mobility aids as needed. Install an electrical emergency response system. Turn on lights to avoid dark areas. Replace light bulbs that burn out immediately. Get light switches that glow. Arrange furniture to create clear pathways. Keep furniture in the same place. Firmly attach carpet with non-skid or double-sided tape. Eliminate uneven floor surfaces. Select a carpet pattern that does not visually hide the edge of steps. Be aware of all pets. OTHER HOME SAFETY TIPS Set the water temperature for 120 F (48.8 C). Keep  emergency numbers on or near the telephone. Keep smoke detectors on every level of the home and near sleeping areas. Document Released: 08/01/2002 Document Revised: 02/10/2012 Document Reviewed: 10/31/2011 [facility] Patient Information 2015 [place], [place]. This information is not intended to replace advice given to you by your health care provider. Make  sure you discuss any questions you have with your health care provider.     Access Code: [email or web address] URL: [email or web address] Date: 10/08/2021 Prepared by: Garen Lah  Program Notes sit to stand as movement snack before you  eat  breakfast , lunch and dinner   Exercises Sit to Stand with Counter Support - 3 x daily - 7 x weekly - 1 sets - 10 reps Supine Lower Trunk Rotation - 1 x daily - 7 x weekly - 3 sets - 10 reps Seated Hamstring Stretch - 1-2 x daily - 7 x weekly - 1 sets - 2-3 reps - 30 hold Standing March with Counter Support - 1 x daily - 7 x weekly - 2 sets - 10 reps   Garen Lah, PT, [facility] Certified Exercise Expert for the Aging Adult  10/08/21 7:58 AM Phone: [phone] Fax: [phone]

## 2021-10-08 NOTE — Patient Instructions (Signed)
Medication Instructions:  Your physician recommends that you continue on your current medications as directed. Please refer to the Current Medication list given to you today.  *If you need a refill on your cardiac medications before your next appointment, please call your pharmacy*   Lab Work: None ordered  If you have labs (blood work) drawn today and your tests are completely normal, you will receive your results only by: MyChart Message (if you have MyChart) OR A paper copy in the mail If you have any lab test that is abnormal or we need to change your treatment, we will call you to review the results.   Testing/Procedures: None ordered   Follow-Up: At [facility], you and your health needs are our priority.  As part of our continuing mission to provide you with exceptional heart care, we have created designated Provider Care Teams.  These Care Teams include your primary Cardiologist (physician) and Advanced Practice Providers (APPs -  Physician Assistants and Nurse Practitioners) who all work together to provide you with the care you need, when you need it.  We recommend signing up for the patient portal called "MyChart".  Sign up information is provided on this After Visit Summary.  MyChart is used to connect with patients for Virtual Visits (Telemedicine).  Patients are able to view lab/test results, encounter notes, upcoming appointments, etc.  Non-urgent messages can be sent to your provider as well.   To learn more about what you can do with MyChart, go to [email or web address].    Your next appointment:   12 month(s)  The format for your next appointment:   In Person  Provider:   Lesleigh Noe, MD  or Jacolyn Reedy, PA-C         Other Instructions Low-Sodium Eating Plan Sodium, which is an element that makes up salt, helps you maintain a healthy balance of fluids in your body. Too much sodium can increase your blood pressure and cause fluid and waste to be held  in your body. Your health care provider or dietitian may recommend following this plan if you have high blood pressure (hypertension), kidney disease, liver disease, or heart failure. Eating less sodium can help lower your blood pressure, reduce swelling, and protect your heart, liver, and kidneys. What are tips for following this plan? Reading food labels The Nutrition Facts label lists the amount of sodium in one serving of the food. If you eat more than one serving, you must multiply the listed amount of sodium by the number of servings. Choose foods with less than 140 mg of sodium per serving. Avoid foods with 300 mg of sodium or more per serving. Shopping  Look for lower-sodium products, often labeled as "low-sodium" or "no salt added." Always check the sodium content, even if foods are labeled as "unsalted" or "no salt added." Buy fresh foods. Avoid canned foods and pre-made or frozen meals. Avoid canned, cured, or processed meats. Buy breads that have less than 80 mg of sodium per slice. Cooking  Eat more home-cooked food and less restaurant, buffet, and fast food. Avoid adding salt when cooking. Use salt-free seasonings or herbs instead of table salt or sea salt. Check with your health care provider or pharmacist before using salt substitutes. Cook with plant-based oils, such as canola, sunflower, or olive oil. Meal planning When eating at a restaurant, ask that your food be prepared with less salt or no salt, if possible. Avoid dishes labeled as brined, pickled, cured, smoked, or made  with soy sauce, miso, or teriyaki sauce. Avoid foods that contain MSG (monosodium glutamate). MSG is sometimes added to [place] food, bouillon, and some canned foods. Make meals that can be grilled, baked, poached, roasted, or steamed. These are generally made with less sodium. General information Most people on this plan should limit their sodium intake to 1,500-2,000 mg (milligrams) of sodium each  day. What foods should I eat? Fruits Fresh, frozen, or canned fruit. Fruit juice. Vegetables Fresh or frozen vegetables. "No salt added" canned vegetables. "No salt added" tomato sauce and paste. Low-sodium or reduced-sodium tomato and vegetable juice. Grains Low-sodium cereals, including oats, puffed wheat and rice, and shredded wheat. Low-sodium crackers. Unsalted rice. Unsalted pasta. Low-sodium bread. Whole-grain breads and whole-grain pasta. Meats and other proteins Fresh or frozen (no salt added) meat, poultry, seafood, and fish. Low-sodium canned tuna and salmon. Unsalted nuts. Dried peas, beans, and lentils without added salt. Unsalted canned beans. Eggs. Unsalted nut butters. Dairy Milk. Soy milk. Cheese that is naturally low in sodium, such as ricotta cheese, fresh mozzarella, or Swiss cheese. Low-sodium or reduced-sodium cheese. Cream cheese. Yogurt. Seasonings and condiments Fresh and dried herbs and spices. Salt-free seasonings. Low-sodium mustard and ketchup. Sodium-free salad dressing. Sodium-free light mayonnaise. Fresh or refrigerated horseradish. Lemon juice. Vinegar. Other foods Homemade, reduced-sodium, or low-sodium soups. Unsalted popcorn and pretzels. Low-salt or salt-free chips. The items listed above may not be a complete list of foods and beverages you can eat. Contact a dietitian for more information. What foods should I avoid? Vegetables Sauerkraut, pickled vegetables, and relishes. Olives. [place] fries. Onion rings. Regular canned vegetables (not low-sodium or reduced-sodium). Regular canned tomato sauce and paste (not low-sodium or reduced-sodium). Regular tomato and vegetable juice (not low-sodium or reduced-sodium). Frozen vegetables in sauces. Grains Instant hot cereals. Bread stuffing, pancake, and biscuit mixes. Croutons. Seasoned rice or pasta mixes. Noodle soup cups. Boxed or frozen macaroni and cheese. Regular salted crackers. Self-rising flour. Meats and  other proteins Meat or fish that is salted, canned, smoked, spiced, or pickled. Precooked or cured meat, such as sausages or meat loaves. Tomasa Blase. Ham. Pepperoni. Hot dogs. Corned beef. Chipped beef. Salt pork. Jerky. Pickled herring. Anchovies and sardines. Regular canned tuna. Salted nuts. Dairy Processed cheese and cheese spreads. Hard cheeses. Cheese curds. Blue cheese. Feta cheese. String cheese. Regular cottage cheese. Buttermilk. Canned milk. Fats and oils Salted butter. Regular margarine. Ghee. Bacon fat. Seasonings and condiments Onion salt, garlic salt, seasoned salt, table salt, and sea salt. Canned and packaged gravies. Worcestershire sauce. Tartar sauce. Barbecue sauce. Teriyaki sauce. Soy sauce, including reduced-sodium. Steak sauce. Fish sauce. Oyster sauce. Cocktail sauce. Horseradish that you find on the shelf. Regular ketchup and mustard. Meat flavorings and tenderizers. Bouillon cubes. Hot sauce. Pre-made or packaged marinades. Pre-made or packaged taco seasonings. Relishes. Regular salad dressings. Salsa. Other foods Salted popcorn and pretzels. Corn chips and puffs. Potato and tortilla chips. Canned or dried soups. Pizza. Frozen entrees and pot pies. The items listed above may not be a complete list of foods and beverages you should avoid. Contact a dietitian for more information. Summary Eating less sodium can help lower your blood pressure, reduce swelling, and protect your heart, liver, and kidneys. Most people on this plan should limit their sodium intake to 1,500-2,000 mg (milligrams) of sodium each day. Canned, boxed, and frozen foods are high in sodium. Restaurant foods, fast foods, and pizza are also very high in sodium. You also get sodium by adding salt to food. Try to  cook at home, eat more fresh fruits and vegetables, and eat less fast food and canned, processed, or prepared foods. This information is not intended to replace advice given to you by your health care  provider. Make sure you discuss any questions you have with your health care provider. Document Revised: 09/16/2019 Document Reviewed: 07/13/2019 Elsevier Patient Education  2022 [employer].

## 2021-10-09 NOTE — Therapy (Signed)
OUTPATIENT PHYSICAL THERAPY TREATMENT NOTE   Patient Name: Luis Carter MRN: [id number] DOB:1946/03/04, 76 y.o., male Today's Date: 10/10/2021  PCP: Nolene Ebbs, MD REFERRING PROVIDER: Jonetta Osgood, MD   PT End of Session - 10/10/21 2155     Visit Number 3    Number of Visits 16    Date for PT Re-Evaluation 01/21/22    Authorization Type UHC MCR/MCD    PT Start Time 1022    PT Stop Time 1104    PT Time Calculation (min) 42 min    Activity Tolerance Patient tolerated treatment well    Behavior During Therapy WFL for tasks assessed/performed              Past Medical History:  Diagnosis Date   Arthritis    "left arm/shoulder; both legs" (12/12/2015)   ASTHMA    since childhood   Cardiomyopathy ([place]) 01/25/2016   Echo 12/13/15 - Mild LVH, EF 45-50%, normal wall motion, grade 1 diastolic dysfunction, mild LAE, mild RVE, mild RAE   COPD 04/14/2007   Coronary artery disease    DIABETES MELLITUS, TYPE II 04/14/2007   GERD (gastroesophageal reflux disease)    GI bleed    Gout    High cholesterol    History of nuclear stress test    a.  Myoview 6/17: EF 54%, no ischemia   HYPERTENSION 04/14/2007   LEG PAIN, RIGHT 05/19/2008   LOW BACK PAIN 04/14/2007   Myocardial infarction ([place])    ~ 2001/notes 09/13/2001 (03/22/2013)   PANCREATITIS, HX OF 04/14/2007   Pneumonia    "twice" (12/12/2015)   SEIZURE DISORDER 04/14/2007   "used to have them when I was young" (03/22/2013)   Shortness of breath    "related to asthma" (03/22/2013)   Past Surgical History:  Procedure Laterality Date   CORONARY ANGIOPLASTY WITH STENT PLACEMENT     ESOPHAGOGASTRODUODENOSCOPY  12/21/2011   Procedure: ESOPHAGOGASTRODUODENOSCOPY (EGD);  Surgeon: Inda Castle, MD;  Location: [facility];  Service: Endoscopy;  Laterality: N/A;   Patient Active Problem List   Diagnosis Date Noted   Physical debility 09/17/2021   Acute right hip pain 09/16/2021   HLD (hyperlipidemia) 09/15/2021   COVID-19  virus infection 09/15/2021   Acute exacerbation of chronic obstructive pulmonary disease (COPD) ([place]) 03/11/2021   SIRS (systemic inflammatory response syndrome) ([place]) 03/11/2021   Malnutrition of moderate degree 02/11/2021   Hyperkalemia 02/10/2021   Dyspnea 02/08/2021   Community acquired pneumonia of right upper lobe of lung    Respiratory failure with hypoxia and hypercapnia ([place]) 10/11/2018   Acute pain of left shoulder 10/15/2016   Pain in right leg 10/15/2016   Weakness of right leg 10/15/2016   Cardiomyopathy ([place]) 01/25/2016   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, stage 3b ([place]) 12/13/2015   Abnormal echocardiogram 12/13/2015   Syncope 12/12/2015   Tobacco use disorder 09/07/2015   Gout 01/27/2012   Cocaine use 12/22/2011   Acute chest pain 12/18/2008   Diabetes mellitus with renal complications ([place]) [id number]   Hypertension 04/14/2007   Asthma 04/14/2007   COPD (chronic obstructive pulmonary disease) ([place]) 04/14/2007   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 Back pain since2008, leg pain Right since 2009 MI, pacreatitis. Chronic hx of lumbar stenosis. See extensive medical history  PRECAUTIONS: Fall  SUBJECTIVE: P reports his L knee is bothering him today.  PAIN:  Are you having pain? Yes NPRS scale: 0/10 in back; L knee 5/10; R knee 0/10 Pain location:  mostly knees today, bil leg mostly knees and lateral hips, R shoulder, low back medial Pain  orientation: Bilateral legs, low back medial  R shld PAIN TYPE: aching, sharp, and tight Pain description: intermittent  Aggravating factors: hard to get in and out of the bath, difficult walking for longer than 10 min, transfers challenging Relieving factors: resting.     OBJECTIVE:    DIAGNOSTIC FINDINGS:  09-18-21 No signs of fracture or dislocation. Mild degenerative changes about the LEFT and RIGHT hip. Degenerative changes in the lumbar spine. Soft tissues are grossly normal.   IMPRESSION: Mild degenerative changes without acute osseous abnormality.   PATIENT SURVEYS:  FOTO back 51%  predicted 54%   balance 44% predicted 51%    SCREENING FOR RED FLAGS: Bowel or bladder incontinence: No     COGNITION:          Overall cognitive status: Within functional limits for tasks assessed                        SENSATION:          Light touch: Appears intact          Stereognosis: Appears intact          Hot/Cold: Appears intact          Proprioception: Appears intact   MUSCLE LENGTH: Hamstrings: Right 50 deg; Left 54 deg Thomas test: bil hip flexor tightness   POSTURE:  Pt with flexed posture, rounded shoulders and forward neck   PALPATION: Pt with bil lumbar tightness, TTP bil hip post to greater troch, No TTP for R shoulder but limited range, Pt with complaint of bil knees but minimal tenderness to palpation on knees globally   LUMBARAROM/PROM   A/PROM A/PROM  10/01/2021  Flexion 50% fingertips to mid shin  Extension  10% pain with ext  Right lateral flexion 50%  Left lateral flexion 50%   Right rotation 75%  Left rotation 50%   (Blank rows = not tested)   LE AROM/PROM:                               All WNL except noted A/PROM Right 10/01/2021 Left 10/01/2021  Hip flexion      Hip extension      Hip abduction      Hip adduction      Hip internal rotation 25 35  Hip external rotation 35 49  Knee flexion      Knee extension      Ankle dorsiflexion       Ankle plantarflexion      Ankle inversion      Ankle eversion       (Blank rows = not tested)   LE MMT:   MMT Right 10/01/2021 Left 10/01/2021  Hip flexion 4-/5 4-/5  Hip extension 4-/5 4-/5  Hip abduction 3+/5 3+/5  Hip adduction      Hip internal rotation      Hip external rotation      Knee flexion 4-/5 4/5  Knee extension 4-/5 4/5  Ankle dorsiflexion 4-/5 4-/5  Ankle plantarflexion      Ankle inversion      Ankle eversion       (Blank rows = not tested)   UPPER EXTREMITY AROM/PROM:   A/PROM Right 10/01/2021 Left 10/01/2021  Shoulder flexion 135 143  Shoulder extension      Shoulder abduction 130 140  Shoulder adduction      Shoulder internal rotation      Shoulder external rotation      Elbow flexion      Elbow extension      Wrist flexion      Wrist extension      Wrist ulnar deviation      Wrist radial deviation      Wrist pronation      Wrist supination      (Blank rows = not tested)   UPPER EXTREMITY MMT: Grossly 4 to 4+/5 throughout MMT Right 10/01/2021 Left 10/01/2021  Shoulder flexion      Shoulder extension      Shoulder abduction      Shoulder adduction      Middle trapezius      Lower trapezius      Elbow flexion      Elbow extension      Wrist flexion      Wrist extension      Wrist ulnar deviation      Wrist radial deviation      Wrist pronation      Wrist supination      Grip strength (lbs)      (Blank rows = not tested)     LUMBAR SPECIAL TESTS:  Straight leg raise test: Negative and Slump test: Negative   FUNCTIONAL TESTS:  30 sec STS  6 x must use hands   GAIT: Distance walked: 130 Assistive device utilized: Single point cane Level of assistance: SBA Comments: 2.01 ft/sec   BERG BALANCE TEST Sitting to Standing: 3.      Stands independently using hands Standing Unsupported: 3.      Stands 2 minutes with supervision Sitting Unsupported: 4.     Sits for 2 minutes independently Standing to Sitting: 3.     Controls descent with  hands  Transfers: 3.     Transfers safely definite use of hands Standing with eyes closed: 3.     Stands 10 seconds with supervision Standing with feet together: 3.     Stands for 1 minute with supervision Reaching forward with outstretched arm: 3.     Reaches forward 5 inches Retrieving object from the floor: 3.     Able to pick up with supervision Turning to look behind: 2.     Turns sideways only, maintains balance Turning 360 degrees: 2.     Able to turn slowly, but safely Place alternate foot on stool: 2.     4 steps without assistance/supervision Standing with one foot in front: 1.     Needs help to step, but can hold for 15 seconds Standing on one foot: 1.     Holds <3 seconds   Total Score: 36/56    TODAY'S TREATMENT  [facility] Adult PT Treatment:  DATE: 10/10/21 Therapeutic Exercise: STS c use of hands x5 LAQ 2x10 6# SLR 2x10 6# Bridging 2x10 decreased core stability Clamshell 2x10 BluTB Self care:  Updated HEP   [facility] Adult PT Treatment:                                                DATE: 10-08-21 Therapeutic Exercise:    Sit to Stand with Counter Support - 2 x 10 reps Standing UE wall slide with hand cloths 2 x 10 and then added hip extension 1 x 10 R and L Standing march at counter 2 x 10  Supine Lower Trunk Rotation -5  reps to R and L 20 sec each Seated Hamstring Stretch - 1-2 x daily - 7 x weekly - 1 sets - 2-3 reps - 30 hold needs VC and TC for correct execution Bridge 1 x 10 1/2 range  end range hold 3 sec  irritated back then [facility] R and L hold for 10 sec each x 3  SLR 1 x 10 Sitting flexion stretch, hands down legs to ankle and hold for back stretch 10 -15 sec x 3 each   Gait - 6MWT 599 ft     Gait trained on rolling walker.  Pt tends to cruise walls with cane and decreased stride length and wide based gait.  Pt educated on stair climbing using cane - pt uncoordinated with cane use and recommend walker for safety full  time at this time.    Self Care: Fall prevention handout discussed with sister need for lights in bedroom and on steps as needed , gave handout   Eval -Initial HEP. Movement snack squats 3 x a day  trunk rotation     PATIENT EDUCATION:  Education details: POC Explanation of findings.  BERG 36/56, need for walker full time until stronger, fall prevention handout and discussion. Emphasis on need for full time walker since pt cruises wall while using cane, Initial HEP issued Person educated: Patient and sister of pt Orby Hogle Education method: Explanation, Demonstration, Tactile cues, Verbal cues, and Handouts Education comprehension: verbalized understanding, returned demonstration, verbal cues required, and needs further education     HOME EXERCISE PROGRAM: Access Code: [email or web address] URL: [email or web address] Date: 10/10/2021 Prepared by: Gar Ponto  Program Notes sit to stand as movement snack before you  eat  breakfast , lunch and dinner   Exercises Sit to Stand with Counter Support - 3 x daily - 7 x weekly - 1 sets - 10 reps Supine Lower Trunk Rotation - 1 x daily - 7 x weekly - 3 sets - 10 reps Seated Hamstring Stretch - 1-2 x daily - 7 x weekly - 1 sets - 2-3 reps - 30 hold Standing March with Counter Support - 1 x daily - 7 x weekly - 2 sets - 10 reps Supine Bridge - 1 x daily - 7 x weekly - 2 sets - 10 reps - 3 hold Hooklying Clamshell with Resistance - 1 x daily - 7 x weekly - 2 sets - 10 reps - 3 hold Supine Straight Leg Raises - 1 x daily - 7 x weekly - 2 sets - 10 reps - 3 hold       ASSESSMENT:   CLINICAL IMPRESSION: Pt presented to today's PT session using a RW and demonstrating improved ambulation balance. PT was completed  for trunk/LE strengthening. Pt demonstrated decreased hip and trunk strength for stability c bridging. Pt's HEP was updated with pt returning demonstration of exs. Fater the ssion, pt stated his Legs felt better. PT was  tolerated without adverse effects. Pt will benefit from continued skilled PT to address strength and balance deficits to optimize functional mobility.   Objective impairments include cardiopulmonary status limiting activity, decreased activity tolerance, decreased balance, decreased endurance, decreased knowledge of use of DME, decreased mobility, difficulty walking, decreased ROM, decreased strength, decreased safety awareness, hypomobility, impaired flexibility, impaired UE functional use, improper body mechanics, postural dysfunction, and pain. These impairments are limiting patient from  ADL's safe transfer and ambulation . Personal factors including  HTN, HLD, CKD 3B, DM2, gout, COPD Chronic hx of lumbar stenosis. are also affecting patient's functional outcome. Patient will benefit from skilled PT to address above impairments and improve overall function.   REHAB POTENTIAL: Fair Fair   CLINICAL DECISION MAKING: Evolving/moderate complexity   EVALUATION COMPLEXITY: Moderate     GOALS: Goals reviewed with patient? Yes   SHORT TERM GOALS:   STG Name Target Date Goal status  1 Pt independent with intial HEP Baseline:  10/29/2021 INITIAL  2 Demonstrate symmetry with gait on level surface with use of a cane on the LT Baseline: Pt with decreased stance length on R and wide based gait with trendelenberg gait 10/29/2021 INITIAL  3 Sit and stand with RT=LT wt bearing to reduce lumbar strain with transfers Baseline:Pt bears weight to left with R LE more anterior to bear wt in L LE 10/29/2021 INITIAL  4 Pt will be able to perform 5 x STS without use of UE Baseline: 30 sec STS with heavy dependence of bil UE 10/29/2021 INITIAL  5 Pt/sister will be educated on Fall prevention and given handout tools to prepare home for decrease risk of fall Baseline: limited knowledge 10/29/2021 INITIAL    LONG TERM GOALS:    LTG Name Target Date Goal status  1 Pt will be independent with advanced HEP.  Baseline: no  knowledge 11/26/2021 INITIAL  2 Sit and stand with RT=LT wt bearing to reduce lumbar strain and allow for increased tolerance for these positions for work tasks Baseline: wt bears to Left 11/26/2021 INITIAL  3 FOTO will improve balance from  44%   to  51%   indicating improved functional mobility .  Baseline: eval 44% balance 11/26/2021 INITIAL  4 Pt will improve L/R knee extensor/flexor strength to >/= 4+/5 with </= 2/10 pain to promote safety with walking/standing activities Baseline: Pt with 4-/5 bil LE strength or less See flow sheet 11/26/2021 INITIAL  5 Pt will demonstrate/simulate ability transfer in and out of bathtub with safety Baseline: Pt with difficulty bathing due to pain in bil legs 11/26/2021 INITIAL  6 Pt will walk for 750 feet with LRAD without excessie fatigue Baseline: 6 MWT TBA 11/26/2021 INITIAL  7 Pt will be able to negotiate stairs without exacerbating pain. Baseline:  Pt with pain up to 8/10 on steps 11/26/2021 INITIAL    PLAN: PT FREQUENCY: 2x/week   PT DURATION: 8 weeks   PLANNED INTERVENTIONS: Therapeutic exercises, Therapeutic activity, Neuro Muscular re-education, Balance training, Gait training, Patient/Family education, Joint mobilization, Stair training, DME instructions, Dry Needling, Electrical stimulation, Spinal mobilization, Cryotherapy, Moist heat, Taping, Ionotophoresis 4mg /ml Dexamethasone, and Manual therapy   PLAN FOR NEXT SESSION:  Gait training review with walker,  continue strengthening of bil LE for fall prevention and safe ambulation.  Pt would  like to eventually use a cane full time but for now BERG was 39/56 and needs walker due to holding onto walls while entering clinic     [employer] MS, PT 10/10/21 10:20 PM

## 2021-10-10 ENCOUNTER — Other Ambulatory Visit: Payer: Self-pay

## 2021-10-10 ENCOUNTER — Ambulatory Visit: Payer: Medicare Other

## 2021-10-10 DIAGNOSIS — R296 Repeated falls: Secondary | ICD-10-CM | POA: Diagnosis not present

## 2021-10-10 DIAGNOSIS — G8929 Other chronic pain: Secondary | ICD-10-CM

## 2021-10-10 DIAGNOSIS — M25511 Pain in right shoulder: Secondary | ICD-10-CM | POA: Diagnosis not present

## 2021-10-10 DIAGNOSIS — M6281 Muscle weakness (generalized): Secondary | ICD-10-CM

## 2021-10-10 DIAGNOSIS — M79605 Pain in left leg: Secondary | ICD-10-CM | POA: Diagnosis not present

## 2021-10-10 DIAGNOSIS — M79604 Pain in right leg: Secondary | ICD-10-CM | POA: Diagnosis not present

## 2021-10-15 ENCOUNTER — Ambulatory Visit: Payer: Medicare Other

## 2021-10-15 ENCOUNTER — Other Ambulatory Visit: Payer: Self-pay

## 2021-10-15 DIAGNOSIS — M79604 Pain in right leg: Secondary | ICD-10-CM | POA: Diagnosis not present

## 2021-10-15 DIAGNOSIS — M79605 Pain in left leg: Secondary | ICD-10-CM

## 2021-10-15 DIAGNOSIS — M25511 Pain in right shoulder: Secondary | ICD-10-CM | POA: Diagnosis not present

## 2021-10-15 DIAGNOSIS — M6281 Muscle weakness (generalized): Secondary | ICD-10-CM | POA: Diagnosis not present

## 2021-10-15 DIAGNOSIS — G8929 Other chronic pain: Secondary | ICD-10-CM | POA: Diagnosis not present

## 2021-10-15 DIAGNOSIS — R296 Repeated falls: Secondary | ICD-10-CM

## 2021-10-15 NOTE — Therapy (Signed)
OUTPATIENT PHYSICAL THERAPY TREATMENT NOTE   Patient Name: Luis Carter MRN: [id number] DOB:November 07, 1945, 76 y.o., male Today's Date: 10/15/2021  PCP: Nolene Ebbs, MD REFERRING PROVIDER: Nolene Ebbs, MD   PT End of Session - 10/15/21 1009     Visit Number 4    Number of Visits 16    Date for PT Re-Evaluation 01/21/22    Authorization Type [employer] MCR/MCD    PT Start Time 0933    PT Stop Time 1018    PT Time Calculation (min) 45 min    Activity Tolerance Patient tolerated treatment well    Behavior During Therapy [facility] for tasks assessed/performed               Past Medical History:  Diagnosis Date   Arthritis    "left arm/shoulder; both legs" (12/12/2015)   ASTHMA    since childhood   Cardiomyopathy ([place]) 01/25/2016   Echo 12/13/15 - Mild LVH, EF 45-50%, normal wall motion, grade 1 diastolic dysfunction, mild LAE, mild RVE, mild RAE   COPD 04/14/2007   Coronary artery disease    DIABETES MELLITUS, TYPE II 04/14/2007   GERD (gastroesophageal reflux disease)    GI bleed    Gout    High cholesterol    History of nuclear stress test    a.  Myoview 6/17: EF 54%, no ischemia   HYPERTENSION 04/14/2007   LEG PAIN, RIGHT 05/19/2008   LOW BACK PAIN 04/14/2007   Myocardial infarction ([place])    ~ 2001/notes 09/13/2001 (03/22/2013)   PANCREATITIS, HX OF 04/14/2007   Pneumonia    "twice" (12/12/2015)   SEIZURE DISORDER 04/14/2007   "used to have them when I was young" (03/22/2013)   Shortness of breath    "related to asthma" (03/22/2013)   Past Surgical History:  Procedure Laterality Date   CORONARY ANGIOPLASTY WITH STENT PLACEMENT     ESOPHAGOGASTRODUODENOSCOPY  12/21/2011   Procedure: ESOPHAGOGASTRODUODENOSCOPY (EGD);  Surgeon: Inda Castle, MD;  Location: [facility];  Service: Endoscopy;  Laterality: N/A;   Patient Active Problem List   Diagnosis Date Noted   Physical debility 09/17/2021   Acute right hip pain 09/16/2021   HLD (hyperlipidemia) 09/15/2021   COVID-19  virus infection 09/15/2021   Acute exacerbation of chronic obstructive pulmonary disease (COPD) ([place]) 03/11/2021   SIRS (systemic inflammatory response syndrome) ([place]) 03/11/2021   Malnutrition of moderate degree 02/11/2021   Hyperkalemia 02/10/2021   Dyspnea 02/08/2021   Community acquired pneumonia of right upper lobe of lung    Respiratory failure with hypoxia and hypercapnia ([place]) 10/11/2018   Acute pain of left shoulder 10/15/2016   Pain in right leg 10/15/2016   Weakness of right leg 10/15/2016   Cardiomyopathy ([place]) 01/25/2016   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, stage 3b ([place]) 12/13/2015   Abnormal echocardiogram 12/13/2015   Syncope 12/12/2015   Tobacco use disorder 09/07/2015   Gout 01/27/2012   Cocaine use 12/22/2011   Acute chest pain 12/18/2008   Diabetes mellitus with renal complications ([place]) [id number]   Hypertension 04/14/2007   Asthma 04/14/2007   COPD (chronic obstructive pulmonary disease) ([place]) 04/14/2007   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 Back pain since2008, leg pain Right since 2009 MI, pacreatitis. Chronic hx of lumbar stenosis. See extensive medical history  PRECAUTIONS: Fall  SUBJECTIVE: Pt reports no knee pain today. He notes his L hip is bothering him today  PAIN:  Are you having pain? Yes NPRS scale: 0/10 in back; L knee 0/10; R knee 0/10 Pain location:  mostly knees today, bil leg mostly knees and lateral hips, R shoulder, low back medial Pain  orientation: Bilateral legs, low back medial  R shld PAIN TYPE: aching, sharp, and tight Pain description: intermittent  Aggravating factors: hard to get in and out of the bath, difficult walking for longer than 10 min, transfers challenging Relieving factors: resting.     OBJECTIVE:    DIAGNOSTIC FINDINGS:  09-18-21 No signs of fracture or dislocation. Mild degenerative changes about the LEFT and RIGHT hip. Degenerative changes in the lumbar spine. Soft tissues are grossly normal.   IMPRESSION: Mild degenerative changes without acute osseous abnormality.   PATIENT SURVEYS:  FOTO back 51%  predicted 54%   balance 44% predicted 51%    SCREENING FOR RED FLAGS: Bowel or bladder incontinence: No     COGNITION:          Overall cognitive status: Within functional limits for tasks assessed                        SENSATION:          Light touch: Appears intact          Stereognosis: Appears intact          Hot/Cold: Appears intact          Proprioception: Appears intact   MUSCLE LENGTH: Hamstrings: Right 50 deg; Left 54 deg Thomas test: bil hip flexor tightness   POSTURE:  Pt with flexed posture, rounded shoulders and forward neck   PALPATION: Pt with bil lumbar tightness, TTP bil hip post to greater troch, No TTP for R shoulder but limited range, Pt with complaint of bil knees but minimal tenderness to palpation on knees globally   LUMBARAROM/PROM   A/PROM A/PROM  10/01/2021  Flexion 50% fingertips to mid shin  Extension  10% pain with ext  Right lateral flexion 50%  Left lateral flexion 50%   Right rotation 75%  Left rotation 50%   (Blank rows = not tested)   LE AROM/PROM:                               All WNL except noted A/PROM Right 10/01/2021 Left 10/01/2021  Hip flexion      Hip extension      Hip abduction      Hip adduction      Hip internal rotation 25 35  Hip external rotation 35 49  Knee flexion      Knee extension      Ankle dorsiflexion       Ankle plantarflexion      Ankle inversion      Ankle eversion       (Blank rows = not tested)   LE MMT:   MMT Right 10/01/2021 Left 10/01/2021  Hip flexion 4-/5 4-/5  Hip extension 4-/5 4-/5  Hip abduction 3+/5 3+/5  Hip adduction      Hip internal rotation      Hip external rotation      Knee flexion 4-/5 4/5  Knee extension 4-/5 4/5  Ankle dorsiflexion 4-/5 4-/5  Ankle plantarflexion      Ankle inversion      Ankle eversion       (Blank rows = not tested)   UPPER EXTREMITY AROM/PROM:   A/PROM Right 10/01/2021 Left 10/01/2021  Shoulder flexion 135 143  Shoulder extension      Shoulder abduction 130 140  Shoulder adduction      Shoulder internal rotation      Shoulder external rotation      Elbow flexion      Elbow extension      Wrist flexion      Wrist extension      Wrist ulnar deviation      Wrist radial deviation      Wrist pronation      Wrist supination      (Blank rows = not tested)   UPPER EXTREMITY MMT: Grossly 4 to 4+/5 throughout MMT Right 10/01/2021 Left 10/01/2021  Shoulder flexion      Shoulder extension      Shoulder abduction      Shoulder adduction      Middle trapezius      Lower trapezius      Elbow flexion      Elbow extension      Wrist flexion      Wrist extension      Wrist ulnar deviation      Wrist radial deviation      Wrist pronation      Wrist supination      Grip strength (lbs)      (Blank rows = not tested)     LUMBAR SPECIAL TESTS:  Straight leg raise test: Negative and Slump test: Negative   FUNCTIONAL TESTS:  30 sec STS  6 x must use hands   GAIT: Distance walked: 130 Assistive device utilized: Single point cane Level of assistance: SBA Comments: 2.01 ft/sec   BERG BALANCE TEST Sitting to Standing: 3.      Stands independently using hands Standing Unsupported: 3.      Stands 2 minutes with supervision Sitting Unsupported: 4.     Sits for 2 minutes independently Standing to Sitting: 3.     Controls descent with  hands  Transfers: 3.     Transfers safely definite use of hands Standing with eyes closed: 3.     Stands 10 seconds with supervision Standing with feet together: 3.     Stands for 1 minute with supervision Reaching forward with outstretched arm: 3.     Reaches forward 5 inches Retrieving object from the floor: 3.     Able to pick up with supervision Turning to look behind: 2.     Turns sideways only, maintains balance Turning 360 degrees: 2.     Able to turn slowly, but safely Place alternate foot on stool: 2.     4 steps without assistance/supervision Standing with one foot in front: 1.     Needs help to step, but can hold for 15 seconds Standing on one foot: 1.     Holds <3 seconds   Total Score: 36/56    TODAY'S TREATMENT  [facility] Adult PT Treatment:  DATE: 10/15/21 Therapeutic Exercise: Nustep 5 mins L5 UE/LE STS c use of hands 2x5 LAQ 2x10 6# Marching 2x10 6# SLR 2x10 6# LTR x5 5" Piriformis stretch, x2, 20" Bridging 2x10 decreased core stability Clamshell 2x10 BluTB Therapeutic Activity: Standing balance: narrow base, tandem. Use of hands as needed. SBA provided. Self Care: LAQ's and marching for warming up hips and knees prior to standing after sitting for extended time frames   [facility] Adult PT Treatment:                                                DATE: 10/10/21 Therapeutic Exercise: STS c use of hands x5 LAQ 2x10 6# SLR 2x10 6# Bridging 2x10 decreased core stability Clamshell 2x10 BluTB Self care:  Updated HEP   [facility] Adult PT Treatment:                                                DATE: 10-08-21 Therapeutic Exercise:    Sit to Stand with Counter Support - 2 x 10 reps Standing UE wall slide with hand cloths 2 x 10 and then added hip extension 1 x 10 R and L Standing march at counter 2 x 10  Supine Lower Trunk Rotation -5  reps to R and L 20 sec each Seated Hamstring Stretch - 1-2 x daily - 7 x weekly - 1 sets - 2-3  reps - 30 hold needs VC and TC for correct execution Bridge 1 x 10 1/2 range  end range hold 3 sec  irritated back then [facility] R and L hold for 10 sec each x 3  SLR 1 x 10 Sitting flexion stretch, hands down legs to ankle and hold for back stretch 10 -15 sec x 3 each   Gait - 6MWT 599 ft     Gait trained on rolling walker.  Pt tends to cruise walls with cane and decreased stride length and wide based gait.  Pt educated on stair climbing using cane - pt uncoordinated with cane use and recommend walker for safety full time at this time.    Self Care: Fall prevention handout discussed with sister need for lights in bedroom and on steps as needed , gave handout   Eval -Initial HEP. Movement snack squats 3 x a day  trunk rotation     PATIENT EDUCATION:  Education details: POC Explanation of findings.  BERG 36/56, need for walker full time until stronger, fall prevention handout and discussion. Emphasis on need for full time walker since pt cruises wall while using cane, Initial HEP issued Person educated: Patient and sister of pt Travis Saler Education method: Explanation, Demonstration, Tactile cues, Verbal cues, and Handouts Education comprehension: verbalized understanding, returned demonstration, verbal cues required, and needs further education     HOME EXERCISE PROGRAM: Access Code: [email or web address] URL: [email or web address] Date: 10/10/2021 Prepared by: Gar Ponto  Program Notes sit to stand as movement snack before you  eat  breakfast , lunch and dinner   Exercises Sit to Stand with Counter Support - 3 x daily - 7 x weekly - 1 sets - 10 reps Supine Lower Trunk Rotation - 1 x daily - 7 x weekly - 3 sets - 10 reps Seated Hamstring  Stretch - 1-2 x daily - 7 x weekly - 1 sets - 2-3 reps - 30 hold Standing March with Counter Support - 1 x daily - 7 x weekly - 2 sets - 10 reps Supine Bridge - 1 x daily - 7 x weekly - 2 sets - 10 reps - 3 hold Hooklying Clamshell with  Resistance - 1 x daily - 7 x weekly - 2 sets - 10 reps - 3 hold Supine Straight Leg Raises - 1 x daily - 7 x weekly - 2 sets - 10 reps - 3 hold       ASSESSMENT:   CLINICAL IMPRESSION: PT was completed for LE strengthening and balance. SBA was needed for safety with balance activities. Due to the degree and chronicity pt's medical and physical issues, anticipate progress occurring at a slower pace. Pt tolerated today's PT session without adverse effects.   Objective impairments include cardiopulmonary status limiting activity, decreased activity tolerance, decreased balance, decreased endurance, decreased knowledge of use of DME, decreased mobility, difficulty walking, decreased ROM, decreased strength, decreased safety awareness, hypomobility, impaired flexibility, impaired UE functional use, improper body mechanics, postural dysfunction, and pain. These impairments are limiting patient from  ADL's safe transfer and ambulation . Personal factors including  HTN, HLD, CKD 3B, DM2, gout, COPD Chronic hx of lumbar stenosis. are also affecting patient's functional outcome. Patient will benefit from skilled PT to address above impairments and improve overall function.   REHAB POTENTIAL: Fair   CLINICAL DECISION MAKING: Evolving/moderate complexity   EVALUATION COMPLEXITY: Moderate     GOALS: Goals reviewed with patient? Yes   SHORT TERM GOALS:   STG Name Target Date Goal status  1 Pt independent with intial HEP Baseline:  10/29/2021 INITIAL  2 Demonstrate symmetry with gait on level surface with use of a cane on the LT Baseline: Pt with decreased stance length on R and wide based gait with trendelenberg gait 10/29/2021 INITIAL  3 Sit and stand with RT=LT wt bearing to reduce lumbar strain with transfers Baseline:Pt bears weight to left with R LE more anterior to bear wt in L LE 10/29/2021 INITIAL  4 Pt will be able to perform 5 x STS without use of UE Baseline: 30 sec STS with heavy dependence  of bil UE 10/29/2021 INITIAL  5 Pt/sister will be educated on Fall prevention and given handout tools to prepare home for decrease risk of fall Baseline: limited knowledge 10/29/2021 INITIAL    LONG TERM GOALS:    LTG Name Target Date Goal status  1 Pt will be independent with advanced HEP.  Baseline: no knowledge 11/26/2021 INITIAL  2 Sit and stand with RT=LT wt bearing to reduce lumbar strain and allow for increased tolerance for these positions for work tasks Baseline: wt bears to Left 11/26/2021 INITIAL  3 FOTO will improve balance from  44%   to  51%   indicating improved functional mobility .  Baseline: eval 44% balance 11/26/2021 INITIAL  4 Pt will improve L/R knee extensor/flexor strength to >/= 4+/5 with </= 2/10 pain to promote safety with walking/standing activities Baseline: Pt with 4-/5 bil LE strength or less See flow sheet 11/26/2021 INITIAL  5 Pt will demonstrate/simulate ability transfer in and out of bathtub with safety Baseline: Pt with difficulty bathing due to pain in bil legs 11/26/2021 INITIAL  6 Pt will walk for 750 feet with LRAD without excessie fatigue Baseline: 6 MWT TBA 11/26/2021 INITIAL  7 Pt will be able to negotiate stairs  without exacerbating pain. Baseline:  Pt with pain up to 8/10 on steps 11/26/2021 INITIAL    PLAN: PT FREQUENCY: 2x/week   PT DURATION: 8 weeks   PLANNED INTERVENTIONS: Therapeutic exercises, Therapeutic activity, Neuro Muscular re-education, Balance training, Gait training, Patient/Family education, Joint mobilization, Stair training, DME instructions, Dry Needling, Electrical stimulation, Spinal mobilization, Cryotherapy, Moist heat, Taping, Ionotophoresis 4mg /ml Dexamethasone, and Manual therapy   PLAN FOR NEXT SESSION:  Gait training review with walker,  continue strengthening of bil LE for fall prevention and safe ambulation.  Pt would like to eventually use a cane full time but for now BERG was 39/56 and needs walker due to holding onto walls while  entering clinic     [employer] MS, PT 10/15/21 10:01 PM

## 2021-10-17 ENCOUNTER — Ambulatory Visit: Payer: Medicare Other | Admitting: Physical Therapy

## 2021-10-17 ENCOUNTER — Encounter: Payer: Self-pay | Admitting: Physical Therapy

## 2021-10-17 ENCOUNTER — Other Ambulatory Visit: Payer: Self-pay

## 2021-10-17 DIAGNOSIS — G8929 Other chronic pain: Secondary | ICD-10-CM | POA: Diagnosis not present

## 2021-10-17 DIAGNOSIS — R296 Repeated falls: Secondary | ICD-10-CM | POA: Diagnosis not present

## 2021-10-17 DIAGNOSIS — M6281 Muscle weakness (generalized): Secondary | ICD-10-CM | POA: Diagnosis not present

## 2021-10-17 DIAGNOSIS — M25511 Pain in right shoulder: Secondary | ICD-10-CM | POA: Diagnosis not present

## 2021-10-17 DIAGNOSIS — M79604 Pain in right leg: Secondary | ICD-10-CM | POA: Diagnosis not present

## 2021-10-17 DIAGNOSIS — M79605 Pain in left leg: Secondary | ICD-10-CM

## 2021-10-17 NOTE — Therapy (Signed)
OUTPATIENT PHYSICAL THERAPY TREATMENT NOTE   Patient Name: Luis Carter MRN: [id number] DOB:[birth year], 76 y.o., male Today's Date: 10/17/2021  PCP: Fleet Contras, MD REFERRING PROVIDER: Maretta Bees, MD   PT End of Session - 10/17/21 0935     Visit Number 5    Number of Visits 16    Date for PT Re-Evaluation 01/21/22    Authorization Type [employer] MCR/MCD    PT Start Time 0932    PT Stop Time 1015    PT Time Calculation (min) 43 min               Past Medical History:  Diagnosis Date   Arthritis    "left arm/shoulder; both legs" (12/12/2015)   ASTHMA    since childhood   Cardiomyopathy (HCC) 01/25/2016   Echo 12/13/15 - Mild LVH, EF 45-50%, normal wall motion, grade 1 diastolic dysfunction, mild LAE, mild RVE, mild RAE   COPD 04/14/2007   Coronary artery disease    DIABETES MELLITUS, TYPE II 04/14/2007   GERD (gastroesophageal reflux disease)    GI bleed    Gout    High cholesterol    History of nuclear stress test    a.  Myoview 6/17: EF 54%, no ischemia   HYPERTENSION 04/14/2007   LEG PAIN, RIGHT 05/19/2008   LOW BACK PAIN 04/14/2007   Myocardial infarction (HCC)    ~ 2001/notes 09/13/2001 (03/22/2013)   PANCREATITIS, HX OF 04/14/2007   Pneumonia    "twice" (12/12/2015)   SEIZURE DISORDER 04/14/2007   "used to have them when I was young" (03/22/2013)   Shortness of breath    "related to asthma" (03/22/2013)   Past Surgical History:  Procedure Laterality Date   CORONARY ANGIOPLASTY WITH STENT PLACEMENT     ESOPHAGOGASTRODUODENOSCOPY  12/21/2011   Procedure: ESOPHAGOGASTRODUODENOSCOPY (EGD);  Surgeon: Louis Meckel, MD;  Location: [facility] END[facility];  Service: Endoscopy;  Laterality: N/A;   Patient Active Problem List   Diagnosis Date Noted   Physical debility 09/17/2021   Acute right hip pain 09/16/2021   HLD (hyperlipidemia) 09/15/2021   COVID-19 virus infection 09/15/2021   Acute exacerbation of chronic obstructive pulmonary disease (COPD) (HCC) 03/11/2021    SIRS (systemic inflammatory response syndrome) (HCC) 03/11/2021   Malnutrition of moderate degree 02/11/2021   Hyperkalemia 02/10/2021   Dyspnea 02/08/2021   Community acquired pneumonia of right upper lobe of lung    Respiratory failure with hypoxia and hypercapnia (HCC) 10/11/2018   Acute pain of left shoulder 10/15/2016   Pain in right leg 10/15/2016   Weakness of right leg 10/15/2016   Cardiomyopathy (HCC) 01/25/2016   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, stage 3b (HCC) 12/13/2015   Abnormal echocardiogram 12/13/2015   Syncope 12/12/2015   Tobacco use disorder 09/07/2015   Gout 01/27/2012   Cocaine use 12/22/2011   Acute chest pain 12/18/2008   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   Hypertension 04/14/2007   Asthma 04/14/2007   COPD (chronic obstructive pulmonary disease) (HCC) 04/14/2007   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 Back pain since2008, leg pain Right since 2009 MI, pacreatitis. Chronic hx of lumbar stenosis. See extensive medical history  PRECAUTIONS: Fall  SUBJECTIVE: Pt reports only complaint is his right shoulder at 6/10 which started last night with trying to sleep.   PAIN:  Are you having pain? Yes NPRS scale: 0/10 in back; L knee 0/10; R knee 0/10, 6/10 right shoulder Pain location:  right shoulder Pain orientation: anterior PAIN TYPE: aching, sharp, and tight Pain description: intermittent  Aggravating factors: sleep  Relieving factors:  resting.     OBJECTIVE:    DIAGNOSTIC FINDINGS:  09-18-21 No signs of fracture or dislocation. Mild degenerative changes about the LEFT and RIGHT hip. Degenerative changes in the lumbar spine. Soft tissues are grossly normal.   IMPRESSION: Mild degenerative changes without acute osseous abnormality.   PATIENT SURVEYS:  FOTO back 51%  predicted 54%   balance 44% predicted 51%    SCREENING FOR RED FLAGS: Bowel or bladder incontinence: No     COGNITION:          Overall cognitive status: Within functional limits for tasks assessed                        SENSATION:          Light touch: Appears intact          Stereognosis: Appears intact          Hot/Cold: Appears intact          Proprioception: Appears intact   MUSCLE LENGTH: Hamstrings: Right 50 deg; Left 54 deg Thomas test: bil hip flexor tightness   POSTURE:  Pt with flexed posture, rounded shoulders and forward neck   PALPATION: Pt with bil lumbar tightness, TTP bil hip post to greater troch, No TTP for R shoulder but limited range, Pt with complaint of bil knees but minimal tenderness to palpation on knees globally   LUMBARAROM/PROM   A/PROM A/PROM  10/01/2021  Flexion 50% fingertips to mid shin  Extension  10% pain with ext  Right lateral flexion 50%  Left lateral flexion 50%   Right rotation 75%  Left rotation 50%   (Blank rows = not tested)   LE AROM/PROM:                               All WNL except noted A/PROM Right 10/01/2021 Left 10/01/2021  Hip flexion      Hip extension      Hip abduction      Hip adduction      Hip internal rotation 25 35  Hip external rotation 35 49  Knee flexion      Knee extension      Ankle dorsiflexion      Ankle plantarflexion      Ankle inversion      Ankle eversion       (Blank rows = not tested)   LE MMT:   MMT Right 10/01/2021 Left 10/01/2021 Right Left  Hip flexion 4-/5 4-/5 4+/5 4+/5  Hip extension 4-/5 4-/5    Hip abduction 3+/5 3+/5    Hip adduction         Hip internal rotation        Hip external rotation        Knee flexion 4-/5 4/5    Knee  extension 4-/5 4/5    Ankle dorsiflexion 4-/5 4-/5 3+/5 4/5  Ankle plantarflexion        Ankle inversion        Ankle eversion         (Blank rows = not tested)   UPPER EXTREMITY AROM/PROM:   A/PROM Right 10/01/2021 Left 10/01/2021 Right Left  Shoulder flexion 135 143 145   Shoulder extension        Shoulder abduction 130 140 145   Shoulder adduction        Shoulder internal rotation     Mid lumbar reach   Shoulder external rotation     T2 reach   Elbow flexion        Elbow extension        Wrist flexion        Wrist extension        Wrist ulnar deviation        Wrist radial deviation        Wrist pronation        Wrist supination        (Blank rows = not tested)   UPPER EXTREMITY MMT: Grossly 4 to 4+/5 throughout MMT Right 10/01/2021 Left 10/01/2021 Right 10/17/21  Shoulder flexion     4+/5  Shoulder extension       Shoulder abduction     4-/5  Shoulder adduction       Shoulder ER   4+/5  Shoulder IR   5/5  Middle trapezius       Lower trapezius       Elbow flexion       Elbow extension       Wrist flexion       Wrist extension       Wrist ulnar deviation       Wrist radial deviation       Wrist pronation       Wrist supination       Grip strength (lbs)       (Blank rows = not tested)     LUMBAR SPECIAL TESTS:  Straight leg raise test: Negative and Slump test: Negative   FUNCTIONAL TESTS:  30 sec STS  6 x must use hands   GAIT: Distance walked: 130 Assistive device utilized: Single point cane Level of assistance: SBA Comments: 2.01 ft/sec   BERG BALANCE TEST Sitting to Standing: 3.      Stands independently using hands Standing Unsupported: 3.      Stands 2 minutes with supervision Sitting Unsupported: 4.     Sits for 2 minutes independently Standing to Sitting: 3.     Controls descent with hands  Transfers: 3.     Transfers safely definite use of  hands Standing with eyes closed: 3.     Stands 10 seconds with supervision Standing with feet together: 3.     Stands for 1 minute with supervision Reaching forward with outstretched arm: 3.     Reaches forward 5 inches Retrieving object from the floor: 3.     Able to pick up with supervision Turning to look behind: 2.     Turns sideways only, maintains balance Turning 360 degrees: 2.     Able to turn slowly, but safely Place alternate foot on stool: 2.     4 steps without assistance/supervision Standing with one foot in front: 1.     Needs help to step, but can hold for 15 seconds Standing on one foot: 1.  Holds <3 seconds   Total Score: 36/56    TODAY'S TREATMENT  [facility] Adult PT Treatment:                                                DATE: 10/17/21 Therapeutic Exercise:  STS x 10 without UE elevated mat, cues for hip hinge Sidelying clam x 10 each Sidelying hip abduction- x 10 each -min lift -poor glute medius recruitment  Clamshell 2x10 BluTB SLR 10 x 2 each  Bridge 10 x 2 LTR x5 5" Standing hip flexor stretch at RW 2 x 20 sec each Standing at RW: heel raise x 10, toe raises alternating -decreased ROM on right Tandem stance 12 sec best  Seated toe raises Seated bilateral shoulder scaption AROM  Gait in clinic 150 with RW and cues .     [facility] Adult PT Treatment:                                                DATE: 10/15/21 Therapeutic Exercise: Nustep 5 mins L5 UE/LE STS c use of hands 2x5 LAQ 2x10 6# Marching 2x10 6# SLR 2x10 6# LTR x5 5" Piriformis stretch, x2, 20" Bridging 2x10 decreased core stability Clamshell 2x10 BluTB Therapeutic Activity: Standing balance: narrow base, tandem. Use of hands as needed. SBA provided. Self Care: LAQ's and marching for warming up hips and knees prior to standing after sitting for extended time frames   [facility] Adult PT Treatment:                                                DATE: 10/10/21 Therapeutic Exercise: STS c use of  hands x5 LAQ 2x10 6# SLR 2x10 6# Bridging 2x10 decreased core stability Clamshell 2x10 BluTB Self care:  Updated HEP   [facility] Adult PT Treatment:                                                DATE: 10-08-21 Therapeutic Exercise:    Sit to Stand with Counter Support - 2 x 10 reps Standing UE wall slide with hand cloths 2 x 10 and then added hip extension 1 x 10 R and L Standing march at counter 2 x 10  Supine Lower Trunk Rotation -5  reps to R and L 20 sec each Seated Hamstring Stretch - 1-2 x daily - 7 x weekly - 1 sets - 2-3 reps - 30 hold needs VC and TC for correct execution Bridge 1 x 10 1/2 range  end range hold 3 sec  irritated back then [facility] R and L hold for 10 sec each x 3  SLR 1 x 10 Sitting flexion stretch, hands down legs to ankle and hold for back stretch 10 -15 sec x 3 each   Gait - 6MWT 599 ft     Gait trained on rolling walker.  Pt tends to cruise walls with cane and decreased stride length and wide based gait.  Pt educated on stair climbing using cane - pt uncoordinated with cane use and recommend walker for safety full time at this time.    Self Care: Fall prevention handout discussed with sister need for lights in bedroom and on steps as needed , gave handout   Eval -Initial HEP. Movement snack squats 3 x a day  trunk rotation     PATIENT EDUCATION:  Education details: POC Explanation of findings.  BERG 36/56, need for walker full time until stronger, fall prevention handout and discussion. Emphasis on need for full time walker since pt cruises wall while using cane, Initial HEP issued Person educated: Patient and sister of pt Berle Vollrath Education method: Explanation, Demonstration, Tactile cues, Verbal cues, and Handouts Education comprehension: verbalized understanding, returned demonstration, verbal cues required, and needs further education     HOME EXERCISE PROGRAM: Access Code: [email or web address] URL: [email or web address] Date:  10/10/2021 Prepared by: Gar Ponto  Program Notes sit to stand as movement snack before you  eat  breakfast , lunch and dinner   Exercises Sit to Stand with Counter Support - 3 x daily - 7 x weekly - 1 sets - 10 reps Supine Lower Trunk Rotation - 1 x daily - 7 x weekly - 3 sets - 10 reps Seated Hamstring Stretch - 1-2 x daily - 7 x weekly - 1 sets - 2-3 reps - 30 hold Standing March with Counter Support - 1 x daily - 7 x weekly - 2 sets - 10 reps Supine Bridge - 1 x daily - 7 x weekly - 2 sets - 10 reps - 3 hold Hooklying Clamshell with Resistance - 1 x daily - 7 x weekly - 2 sets - 10 reps - 3 hold Supine Straight Leg Raises - 1 x daily - 7 x weekly - 2 sets - 10 reps - 3 hold       ASSESSMENT:   CLINICAL IMPRESSION: PT was completed for LE strengthening and balance. SBA was needed for safety with balance activities.Tandem stance 12 sec best. Noted improvement in hip flexion MMT. Hip abductors significantly weak and unable to perform trus hip abduction in sidelying.  Gait training performed in clininc with RW and cues for step length , erect posture and heel strike. Noted decreased DF strength on right. Afterward pt reported he felt fine.   Objective impairments include cardiopulmonary status limiting activity, decreased activity tolerance, decreased balance, decreased endurance, decreased knowledge of use of DME, decreased mobility, difficulty walking, decreased ROM, decreased strength, decreased safety awareness, hypomobility, impaired flexibility, impaired UE functional use, improper body mechanics, postural dysfunction, and pain. These impairments are limiting patient from  ADL's safe transfer and ambulation . Personal factors including  HTN, HLD, CKD 3B, DM2, gout, COPD Chronic hx of lumbar stenosis. are also affecting patient's functional outcome. Patient will benefit from skilled PT to address above impairments and improve overall function.   REHAB POTENTIAL: Fair   CLINICAL  DECISION MAKING: Evolving/moderate complexity   EVALUATION COMPLEXITY: Moderate     GOALS: Goals reviewed with patient? Yes   SHORT TERM GOALS:   STG Name Target Date Goal status  1 Pt independent with intial HEP Baseline:  10/29/2021 INITIAL  2 Demonstrate symmetry with gait on level surface with use of a cane on the LT Baseline: Pt with decreased stance length on R and wide based gait with trendelenberg gait 10/29/2021 INITIAL  3 Sit and stand with RT=LT wt bearing to reduce lumbar strain with transfers Baseline:Pt bears weight to  left with R LE more anterior to bear wt in L LE 10/29/2021 INITIAL  4 Pt will be able to perform 5 x STS without use of UE Baseline: 30 sec STS with heavy dependence of bil UE 10/29/2021 INITIAL  5 Pt/sister will be educated on Fall prevention and given handout tools to prepare home for decrease risk of fall Baseline: limited knowledge 10/29/2021 INITIAL    LONG TERM GOALS:    LTG Name Target Date Goal status  1 Pt will be independent with advanced HEP.  Baseline: no knowledge 11/26/2021 INITIAL  2 Sit and stand with RT=LT wt bearing to reduce lumbar strain and allow for increased tolerance for these positions for work tasks Baseline: wt bears to Left 11/26/2021 INITIAL  3 FOTO will improve balance from  44%   to  51%   indicating improved functional mobility .  Baseline: eval 44% balance 11/26/2021 INITIAL  4 Pt will improve L/R knee extensor/flexor strength to >/= 4+/5 with </= 2/10 pain to promote safety with walking/standing activities Baseline: Pt with 4-/5 bil LE strength or less See flow sheet 11/26/2021 INITIAL  5 Pt will demonstrate/simulate ability transfer in and out of bathtub with safety Baseline: Pt with difficulty bathing due to pain in bil legs 11/26/2021 INITIAL  6 Pt will walk for 750 feet with LRAD without excessie fatigue Baseline: 6 MWT TBA 11/26/2021 INITIAL  7 Pt will be able to negotiate stairs without exacerbating pain. Baseline:  Pt with pain up  to 8/10 on steps 11/26/2021 INITIAL    PLAN: PT FREQUENCY: 2x/week   PT DURATION: 8 weeks   PLANNED INTERVENTIONS: Therapeutic exercises, Therapeutic activity, Neuro Muscular re-education, Balance training, Gait training, Patient/Family education, Joint mobilization, Stair training, DME instructions, Dry Needling, Electrical stimulation, Spinal mobilization, Cryotherapy, Moist heat, Taping, Ionotophoresis 4mg /ml Dexamethasone, and Manual therapy   PLAN FOR NEXT SESSION:  Gait training review with walker,  continue strengthening of bil LE for fall prevention and safe ambulation.  Pt would like to eventually use a cane full time but for now BERG was 39/56 and needs walker due to holding onto walls while entering clinic     Hessie Diener, PTA 10/17/21 10:17 AM Phone: [phone] Fax: [phone]

## 2021-10-22 ENCOUNTER — Ambulatory Visit: Payer: Medicare Other | Admitting: Physical Therapy

## 2021-10-22 ENCOUNTER — Other Ambulatory Visit: Payer: Self-pay

## 2021-10-22 DIAGNOSIS — M6281 Muscle weakness (generalized): Secondary | ICD-10-CM | POA: Diagnosis not present

## 2021-10-22 DIAGNOSIS — M79604 Pain in right leg: Secondary | ICD-10-CM | POA: Diagnosis not present

## 2021-10-22 DIAGNOSIS — M25511 Pain in right shoulder: Secondary | ICD-10-CM | POA: Diagnosis not present

## 2021-10-22 DIAGNOSIS — M79605 Pain in left leg: Secondary | ICD-10-CM

## 2021-10-22 DIAGNOSIS — G8929 Other chronic pain: Secondary | ICD-10-CM | POA: Diagnosis not present

## 2021-10-22 DIAGNOSIS — R296 Repeated falls: Secondary | ICD-10-CM | POA: Diagnosis not present

## 2021-10-22 NOTE — Therapy (Signed)
OUTPATIENT PHYSICAL THERAPY TREATMENT NOTE   Patient Name: Luis Carter MRN: [id number] DOB:06-18-1946, 76 y.o., male Today's Date: 10/22/2021  PCP: Nolene Ebbs, MD REFERRING PROVIDER: Nolene Ebbs, MD   PT End of Session - 10/22/21 1003     Visit Number 6    Number of Visits 16    Date for PT Re-Evaluation 01/21/22    Authorization Type [employer] MCR/MCD    PT Start Time [phone]    PT Stop Time 1005    PT Time Calculation (min) 40 min                Past Medical History:  Diagnosis Date   Arthritis    "left arm/shoulder; both legs" (12/12/2015)   ASTHMA    since childhood   Cardiomyopathy ([place]) 01/25/2016   Echo 12/13/15 - Mild LVH, EF 45-50%, normal wall motion, grade 1 diastolic dysfunction, mild LAE, mild RVE, mild RAE   COPD 04/14/2007   Coronary artery disease    DIABETES MELLITUS, TYPE II 04/14/2007   GERD (gastroesophageal reflux disease)    GI bleed    Gout    High cholesterol    History of nuclear stress test    a.  Myoview 6/17: EF 54%, no ischemia   HYPERTENSION 04/14/2007   LEG PAIN, RIGHT 05/19/2008   LOW BACK PAIN 04/14/2007   Myocardial infarction ([place])    ~ 2001/notes 09/13/2001 (03/22/2013)   PANCREATITIS, HX OF 04/14/2007   Pneumonia    "twice" (12/12/2015)   SEIZURE DISORDER 04/14/2007   "used to have them when I was young" (03/22/2013)   Shortness of breath    "related to asthma" (03/22/2013)   Past Surgical History:  Procedure Laterality Date   CORONARY ANGIOPLASTY WITH STENT PLACEMENT     ESOPHAGOGASTRODUODENOSCOPY  12/21/2011   Procedure: ESOPHAGOGASTRODUODENOSCOPY (EGD);  Surgeon: Inda Castle, MD;  Location: [facility];  Service: Endoscopy;  Laterality: N/A;   Patient Active Problem List   Diagnosis Date Noted   Physical debility 09/17/2021   Acute right hip pain 09/16/2021   HLD (hyperlipidemia) 09/15/2021   COVID-19 virus infection 09/15/2021   Acute exacerbation of chronic obstructive pulmonary disease (COPD) ([place]) 03/11/2021    SIRS (systemic inflammatory response syndrome) ([place]) 03/11/2021   Malnutrition of moderate degree 02/11/2021   Hyperkalemia 02/10/2021   Dyspnea 02/08/2021   Community acquired pneumonia of right upper lobe of lung    Respiratory failure with hypoxia and hypercapnia ([place]) 10/11/2018   Acute pain of left shoulder 10/15/2016   Pain in right leg 10/15/2016   Weakness of right leg 10/15/2016   Cardiomyopathy ([place]) 01/25/2016   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, stage 3b ([place]) 12/13/2015   Abnormal echocardiogram 12/13/2015   Syncope 12/12/2015   Tobacco use disorder 09/07/2015   Gout 01/27/2012   Cocaine use 12/22/2011   Acute chest pain 12/18/2008   Diabetes mellitus with renal complications ([place]) [id number]   Hypertension 04/14/2007   Asthma 04/14/2007   COPD (chronic obstructive pulmonary disease) ([place]) 04/14/2007   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 Back pain since2008, leg pain Right since 2009 MI, pacreatitis. Chronic hx of lumbar stenosis. See extensive medical history  PRECAUTIONS: Fall  SUBJECTIVE: Pt reports only complaint is his right shoulder at 6/10 which started last night with trying to sleep.   PAIN:  Are you having pain? Yes NPRS scale: 0/10 in back; L knee 0/10; R knee 2/10, 0/10 right shoulder Pain location:  right knee  Pain orientation: anterior PAIN TYPE: aching, sharp, and tight Pain description: intermittent  Aggravating factors: sleep  Relieving factors: resting.     OBJECTIVE:     DIAGNOSTIC FINDINGS:  09-18-21 No signs of fracture or dislocation. Mild degenerative changes about the LEFT and RIGHT hip. Degenerative changes in the lumbar spine. Soft tissues are grossly normal.   IMPRESSION: Mild degenerative changes without acute osseous abnormality.   PATIENT SURVEYS:  FOTO back 51%  predicted 54%   balance 44% predicted 51%    SCREENING FOR RED FLAGS: Bowel or bladder incontinence: No     COGNITION:          Overall cognitive status: Within functional limits for tasks assessed                        SENSATION:          Light touch: Appears intact          Stereognosis: Appears intact          Hot/Cold: Appears intact          Proprioception: Appears intact   MUSCLE LENGTH: Hamstrings: Right 50 deg; Left 54 deg Thomas test: bil hip flexor tightness   POSTURE:  Pt with flexed posture, rounded shoulders and forward neck   PALPATION: Pt with bil lumbar tightness, TTP bil hip post to greater troch, No TTP for R shoulder but limited range, Pt with complaint of bil knees but minimal tenderness to palpation on knees globally   LUMBARAROM/PROM   A/PROM A/PROM  10/01/2021  Flexion 50% fingertips to mid shin  Extension  10% pain with ext  Right lateral flexion 50%  Left lateral flexion 50%   Right rotation 75%  Left rotation 50%   (Blank rows = not tested)   LE AROM/PROM:                               All WNL except noted A/PROM Right 10/01/2021 Left 10/01/2021  Hip flexion      Hip extension      Hip abduction      Hip adduction      Hip internal rotation 25 35  Hip external rotation 35 49  Knee flexion      Knee extension      Ankle dorsiflexion      Ankle plantarflexion      Ankle inversion      Ankle eversion       (Blank rows = not tested)   LE MMT:   MMT Right 10/01/2021 Left 10/01/2021 Right Left  Hip flexion 4-/5 4-/5 4+/5 4+/5  Hip extension 4-/5 4-/5    Hip abduction 3+/5 3+/5    Hip adduction        Hip internal rotation         Hip external rotation        Knee flexion 4-/5 4/5    Knee extension 4-/5 4/5  Ankle dorsiflexion 4-/5 4-/5 3+/5 4/5  Ankle plantarflexion        Ankle inversion        Ankle eversion         (Blank rows = not tested)   UPPER EXTREMITY AROM/PROM:   A/PROM Right 10/01/2021 Left 10/01/2021 Right Left  Shoulder flexion 135 143 145   Shoulder extension        Shoulder abduction 130 140 145   Shoulder adduction        Shoulder internal rotation     Mid lumbar reach   Shoulder external rotation     T2 reach   Elbow flexion        Elbow extension        Wrist flexion        Wrist extension        Wrist ulnar deviation        Wrist radial deviation        Wrist pronation        Wrist supination        (Blank rows = not tested)   UPPER EXTREMITY MMT: Grossly 4 to 4+/5 throughout MMT Right 10/01/2021 Left 10/01/2021 Right 10/17/21  Shoulder flexion     4+/5  Shoulder extension       Shoulder abduction     4-/5  Shoulder adduction       Shoulder ER   4+/5  Shoulder IR   5/5  Middle trapezius       Lower trapezius       Elbow flexion       Elbow extension       Wrist flexion       Wrist extension       Wrist ulnar deviation       Wrist radial deviation       Wrist pronation       Wrist supination       Grip strength (lbs)       (Blank rows = not tested)     LUMBAR SPECIAL TESTS:  Straight leg raise test: Negative and Slump test: Negative   FUNCTIONAL TESTS:  30 sec STS  6 x must use hands   GAIT: Distance walked: 130 Assistive device utilized: Single point cane Level of assistance: SBA Comments: 2.01 ft/sec   BERG BALANCE TEST Sitting to Standing: 3.      Stands independently using hands Standing Unsupported: 3.      Stands 2 minutes with supervision Sitting Unsupported: 4.     Sits for 2 minutes independently Standing to Sitting: 3.     Controls descent with hands  Transfers: 3.     Transfers safely definite use of hands Standing with eyes closed: 3.      Stands 10 seconds with supervision Standing with feet together: 3.     Stands for 1 minute with supervision Reaching forward with outstretched arm: 3.     Reaches forward 5 inches Retrieving object from the floor: 3.     Able to pick up with supervision Turning to look behind: 2.     Turns sideways only, maintains balance Turning 360 degrees: 2.     Able to turn slowly, but safely Place alternate foot on stool: 2.     4 steps without assistance/supervision Standing with one foot in front: 1.     Needs help to step, but can hold for 15 seconds Standing on one foot: 1.     Holds <3 seconds  Total Score: 36/56    TODAY'S TREATMENT  [facility] Adult PT Treatment:                                                DATE: 10/22/21 Therapeutic Exercise: Nustep L5 LE only x 5 minutes  Parallel bars: side stepping 4 passes, standing march, heel raises, hip abduction x 10 each x 2 , tandem trails <5 sec, SLS with 1 UE support.  STS x 10 without UE elevated mat, cues for hip hinge Gait in clinic 150 feet with RW and cues Seated toe raises  Seated bilateral shoulder scaption AROM  Clamshell 2x10 BluTB Bridge 10 x 2 LTR x5 5" SLR 10 x 2 each  Sidelying clam x 15 each, reverse x 10 needs assist    [facility] Adult PT Treatment:                                                DATE: 10/17/21 Therapeutic Exercise:  STS x 10 without UE elevated mat, cues for hip hinge Sidelying clam x 10 each Sidelying hip abduction- x 10 each -min lift -poor glute medius recruitment  Clamshell 2x10 BluTB SLR 10 x 2 each  Bridge 10 x 2 LTR x5 5" Standing hip flexor stretch at RW 2 x 20 sec each Standing at RW: heel raise x 10, toe raises alternating -decreased ROM on right Tandem stance 12 sec best  Seated toe raises Seated bilateral shoulder scaption AROM  Gait in clinic 150 with RW and cues .     [facility] Adult PT Treatment:                                                DATE: 10/15/21 Therapeutic Exercise: Nustep 5 mins L5  UE/LE STS c use of hands 2x5 LAQ 2x10 6# Marching 2x10 6# SLR 2x10 6# LTR x5 5" Piriformis stretch, x2, 20" Bridging 2x10 decreased core stability Clamshell 2x10 BluTB Therapeutic Activity: Standing balance: narrow base, tandem. Use of hands as needed. SBA provided. Self Care: LAQ's and marching for warming up hips and knees prior to standing after sitting for extended time frames       PATIENT EDUCATION:  Education details: POC Explanation of findings.  BERG 36/56, need for walker full time until stronger, fall prevention handout and discussion. Emphasis on need for full time walker since pt cruises wall while using cane, Initial HEP issued Person educated: Patient and sister of pt Heaven Gazdik Education method: Explanation, Demonstration, Tactile cues, Verbal cues, and Handouts Education comprehension: verbalized understanding, returned demonstration, verbal cues required, and needs further education     HOME EXERCISE PROGRAM: Access Code: [email or web address] URL: [email or web address] Date: 10/10/2021 Prepared by: Gar Ponto  Program Notes sit to stand as movement snack before you  eat  breakfast , lunch and dinner   Exercises Sit to Stand with Counter Support - 3 x daily - 7 x weekly - 1 sets - 10 reps Supine Lower Trunk Rotation - 1 x daily - 7 x weekly - 3 sets - 10 reps Seated Hamstring Stretch - 1-2  x daily - 7 x weekly - 1 sets - 2-3 reps - 30 hold Standing March with Counter Support - 1 x daily - 7 x weekly - 2 sets - 10 reps Supine Bridge - 1 x daily - 7 x weekly - 2 sets - 10 reps - 3 hold Hooklying Clamshell with Resistance - 1 x daily - 7 x weekly - 2 sets - 10 reps - 3 hold Supine Straight Leg Raises - 1 x daily - 7 x weekly - 2 sets - 10 reps - 3 hold       ASSESSMENT:   CLINICAL IMPRESSION: Continued with Gait using RW, balance , and LE strengthening as tolerated. Today pt had low level knee pain. He reported fatigue with session, no increased  pain. Continues with lateral hip weakness, unable to reverse clam without assist.   Objective impairments include cardiopulmonary status limiting activity, decreased activity tolerance, decreased balance, decreased endurance, decreased knowledge of use of DME, decreased mobility, difficulty walking, decreased ROM, decreased strength, decreased safety awareness, hypomobility, impaired flexibility, impaired UE functional use, improper body mechanics, postural dysfunction, and pain. These impairments are limiting patient from  ADL's safe transfer and ambulation . Personal factors including  HTN, HLD, CKD 3B, DM2, gout, COPD Chronic hx of lumbar stenosis. are also affecting patient's functional outcome. Patient will benefit from skilled PT to address above impairments and improve overall function.   REHAB POTENTIAL: Fair   CLINICAL DECISION MAKING: Evolving/moderate complexity   EVALUATION COMPLEXITY: Moderate     GOALS: Goals reviewed with patient? Yes   SHORT TERM GOALS:   STG Name Target Date Goal status  1 Pt independent with intial HEP Baseline:  10/29/2021 INITIAL  2 Demonstrate symmetry with gait on level surface with use of a cane on the LT Baseline: Pt with decreased stance length on R and wide based gait with trendelenberg gait 10/29/2021 INITIAL  3 Sit and stand with RT=LT wt bearing to reduce lumbar strain with transfers Baseline:Pt bears weight to left with R LE more anterior to bear wt in L LE 10/29/2021 INITIAL  4 Pt will be able to perform 5 x STS without use of UE Baseline: 30 sec STS with heavy dependence of bil UE 10/29/2021 INITIAL  5 Pt/sister will be educated on Fall prevention and given handout tools to prepare home for decrease risk of fall Baseline: limited knowledge 10/29/2021 INITIAL    LONG TERM GOALS:    LTG Name Target Date Goal status  1 Pt will be independent with advanced HEP.  Baseline: no knowledge 11/26/2021 INITIAL  2 Sit and stand with RT=LT wt bearing to reduce  lumbar strain and allow for increased tolerance for these positions for work tasks Baseline: wt bears to Left 11/26/2021 INITIAL  3 FOTO will improve balance from  44%   to  51%   indicating improved functional mobility .  Baseline: eval 44% balance 11/26/2021 INITIAL  4 Pt will improve L/R knee extensor/flexor strength to >/= 4+/5 with </= 2/10 pain to promote safety with walking/standing activities Baseline: Pt with 4-/5 bil LE strength or less See flow sheet 11/26/2021 INITIAL  5 Pt will demonstrate/simulate ability transfer in and out of bathtub with safety Baseline: Pt with difficulty bathing due to pain in bil legs 11/26/2021 INITIAL  6 Pt will walk for 750 feet with LRAD without excessie fatigue Baseline: 6 MWT TBA 11/26/2021 INITIAL  7 Pt will be able to negotiate stairs without exacerbating pain. Baseline:  Pt with  pain up to 8/10 on steps 11/26/2021 INITIAL    PLAN: PT FREQUENCY: 2x/week   PT DURATION: 8 weeks   PLANNED INTERVENTIONS: Therapeutic exercises, Therapeutic activity, Neuro Muscular re-education, Balance training, Gait training, Patient/Family education, Joint mobilization, Stair training, DME instructions, Dry Needling, Electrical stimulation, Spinal mobilization, Cryotherapy, Moist heat, Taping, Ionotophoresis 4mg /ml Dexamethasone, and Manual therapy   PLAN FOR NEXT SESSION: add ball squeeze;  Gait training review with walker,  continue strengthening of bil LE for fall prevention and safe ambulation.  Pt would like to eventually use a cane full time but for now BERG was 39/56 and needs walker due to holding onto walls while entering clinic.     Hessie Diener, PTA 10/22/21 10:14 AM Phone: [phone] Fax: [phone]

## 2021-10-24 ENCOUNTER — Encounter: Payer: Self-pay | Admitting: Physical Therapy

## 2021-10-24 ENCOUNTER — Ambulatory Visit: Payer: Medicare Other | Attending: Internal Medicine | Admitting: Physical Therapy

## 2021-10-24 ENCOUNTER — Other Ambulatory Visit: Payer: Self-pay

## 2021-10-24 VITALS — BP 100/58 | HR 60

## 2021-10-24 DIAGNOSIS — M6281 Muscle weakness (generalized): Secondary | ICD-10-CM | POA: Diagnosis not present

## 2021-10-24 DIAGNOSIS — M79605 Pain in left leg: Secondary | ICD-10-CM | POA: Diagnosis not present

## 2021-10-24 DIAGNOSIS — M25511 Pain in right shoulder: Secondary | ICD-10-CM | POA: Insufficient documentation

## 2021-10-24 DIAGNOSIS — R296 Repeated falls: Secondary | ICD-10-CM | POA: Diagnosis not present

## 2021-10-24 DIAGNOSIS — G8929 Other chronic pain: Secondary | ICD-10-CM | POA: Insufficient documentation

## 2021-10-24 DIAGNOSIS — M79604 Pain in right leg: Secondary | ICD-10-CM | POA: Diagnosis not present

## 2021-10-24 NOTE — Therapy (Signed)
OUTPATIENT PHYSICAL THERAPY TREATMENT NOTE   Patient Name: Luis Carter MRN: [id number] DOB:14-Apr-1946, 76 y.o., male Today's Date: 10/24/2021  PCP: Fleet Contras, MD REFERRING PROVIDER: Maretta Bees, MD   PT End of Session - 10/24/21 0935     Visit Number 7    Number of Visits 16    Date for PT Re-Evaluation 01/21/22    Authorization Type [employer] MCR/MCD    PT Start Time 0930    PT Stop Time 1010    PT Time Calculation (min) 40 min                Past Medical History:  Diagnosis Date   Arthritis    "left arm/shoulder; both legs" (12/12/2015)   ASTHMA    since childhood   Cardiomyopathy (HCC) 01/25/2016   Echo 12/13/15 - Mild LVH, EF 45-50%, normal wall motion, grade 1 diastolic dysfunction, mild LAE, mild RVE, mild RAE   COPD 04/14/2007   Coronary artery disease    DIABETES MELLITUS, TYPE II 04/14/2007   GERD (gastroesophageal reflux disease)    GI bleed    Gout    High cholesterol    History of nuclear stress test    a.  Myoview 6/17: EF 54%, no ischemia   HYPERTENSION 04/14/2007   LEG PAIN, RIGHT 05/19/2008   LOW BACK PAIN 04/14/2007   Myocardial infarction (HCC)    ~ 2001/notes 09/13/2001 (03/22/2013)   PANCREATITIS, HX OF 04/14/2007   Pneumonia    "twice" (12/12/2015)   SEIZURE DISORDER 04/14/2007   "used to have them when I was young" (03/22/2013)   Shortness of breath    "related to asthma" (03/22/2013)   Past Surgical History:  Procedure Laterality Date   CORONARY ANGIOPLASTY WITH STENT PLACEMENT     ESOPHAGOGASTRODUODENOSCOPY  12/21/2011   Procedure: ESOPHAGOGASTRODUODENOSCOPY (EGD);  Surgeon: Louis Meckel, MD;  Location: [facility] ENDOSCOPY;  Service: Endoscopy;  Laterality: N/A;   Patient Active Problem List   Diagnosis Date Noted   Physical debility 09/17/2021   Acute right hip pain 09/16/2021   HLD (hyperlipidemia) 09/15/2021   COVID-19 virus infection 09/15/2021   Acute exacerbation of chronic obstructive pulmonary disease (COPD) (HCC) 03/11/2021    SIRS (systemic inflammatory response syndrome) (HCC) 03/11/2021   Malnutrition of moderate degree 02/11/2021   Hyperkalemia 02/10/2021   Dyspnea 02/08/2021   Community acquired pneumonia of right upper lobe of lung    Respiratory failure with hypoxia and hypercapnia (HCC) 10/11/2018   Acute pain of left shoulder 10/15/2016   Pain in right leg 10/15/2016   Weakness of right leg 10/15/2016   Cardiomyopathy (HCC) 01/25/2016   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, stage 3b (HCC) 12/13/2015   Abnormal echocardiogram 12/13/2015   Syncope 12/12/2015   Tobacco use disorder 09/07/2015   Gout 01/27/2012   Cocaine use 12/22/2011   Acute chest pain 12/18/2008   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   Hypertension 04/14/2007   Asthma 04/14/2007   COPD (chronic obstructive pulmonary disease) (HCC) 04/14/2007   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 Back pain since2008, leg pain Right since 2009 MI, pacreatitis. Chronic hx of lumbar stenosis. See extensive medical history  PRECAUTIONS: Fall  SUBJECTIVE: Pt reports shoulder is not painful today and no difficulty sleeping last night. He reports increased bilateral knee pain today at 8/10 and he attributes the pain to the weather. He also reports feeling weak and sluggish today.   PAIN:  Are you having pain? Yes NPRS scale: 0/10 in back; L knee 8/10; R knee 8/10, 0/10 right shoulder Pain location:  right, left knee  Pain orientation: anterior PAIN TYPE: aching, sharp,  and tight Pain description: intermittent  Aggravating factors: sleep  Relieving factors: resting.     OBJECTIVE:    DIAGNOSTIC FINDINGS:  09-18-21 No signs of fracture or dislocation. Mild degenerative changes about the LEFT and RIGHT hip. Degenerative changes in the lumbar spine. Soft tissues are grossly normal.   IMPRESSION: Mild degenerative changes without acute osseous abnormality.   PATIENT SURVEYS:  FOTO back 51%  predicted 54%   balance 44% predicted 51%    SCREENING FOR RED FLAGS: Bowel or bladder incontinence: No     COGNITION:          Overall cognitive status: Within functional limits for tasks assessed                        SENSATION:          Light touch: Appears intact          Stereognosis: Appears intact          Hot/Cold: Appears intact          Proprioception: Appears intact   MUSCLE LENGTH: Hamstrings: Right 50 deg; Left 54 deg Thomas test: bil hip flexor tightness   POSTURE:  Pt with flexed posture, rounded shoulders and forward neck   PALPATION: Pt with bil lumbar tightness, TTP bil hip post to greater troch, No TTP for R shoulder but limited range, Pt with complaint of bil knees but minimal tenderness to palpation on knees globally   LUMBARAROM/PROM   A/PROM A/PROM  10/01/2021  Flexion 50% fingertips to mid shin  Extension  10% pain with ext  Right lateral flexion 50%  Left lateral flexion 50%   Right rotation 75%  Left rotation 50%   (Blank rows = not tested)   LE AROM/PROM:                               All WNL except noted A/PROM Right 10/01/2021 Left 10/01/2021  Hip flexion      Hip extension      Hip abduction      Hip adduction      Hip internal rotation 25 35  Hip external rotation 35 49  Knee flexion      Knee extension      Ankle dorsiflexion      Ankle plantarflexion      Ankle inversion      Ankle eversion       (Blank rows = not tested)   LE MMT:   MMT Right 10/01/2021 Left 10/01/2021 Right 10/17/21 Left 10/17/21   Hip flexion 4-/5 4-/5 4+/5 4+/5  Hip extension 4-/5 4-/5    Hip abduction 3+/5 3+/5    Hip adduction        Hip internal rotation  Hip external rotation        Knee flexion 4-/5 4/5    Knee extension 4-/5 4/5    Ankle dorsiflexion 4-/5 4-/5 3+/5 4/5  Ankle plantarflexion        Ankle inversion        Ankle eversion         (Blank rows = not tested)   UPPER EXTREMITY AROM/PROM:   A/PROM Right 10/01/2021 Left 10/01/2021 Right Left  Shoulder flexion 135 143 145   Shoulder extension        Shoulder abduction 130 140 145   Shoulder adduction        Shoulder internal rotation     Mid lumbar reach   Shoulder external rotation     T2 reach   Elbow flexion        Elbow extension        Wrist flexion        Wrist extension        Wrist ulnar deviation        Wrist radial deviation        Wrist pronation        Wrist supination        (Blank rows = not tested)   UPPER EXTREMITY MMT: Grossly 4 to 4+/5 throughout MMT Right 10/01/2021 Left 10/01/2021 Right 10/17/21  Shoulder flexion     4+/5  Shoulder extension       Shoulder abduction     4-/5  Shoulder adduction       Shoulder ER   4+/5  Shoulder IR   5/5  Middle trapezius       Lower trapezius       Elbow flexion       Elbow extension       Wrist flexion       Wrist extension       Wrist ulnar deviation       Wrist radial deviation       Wrist pronation       Wrist supination       Grip strength (lbs)       (Blank rows = not tested)     LUMBAR SPECIAL TESTS:  Straight leg raise test: Negative and Slump test: Negative   FUNCTIONAL TESTS:  30 sec STS  6 x must use hands   GAIT: Distance walked: 130 Assistive device utilized: Single point cane Level of assistance: SBA Comments: 2.01 ft/sec   BERG BALANCE TEST Sitting to Standing: 3.      Stands independently using hands Standing Unsupported: 3.      Stands 2 minutes with supervision Sitting Unsupported: 4.     Sits for 2 minutes independently Standing to  Sitting: 3.     Controls descent with hands  Transfers: 3.     Transfers safely definite use of hands Standing with eyes closed: 3.     Stands 10 seconds with supervision Standing with feet together: 3.     Stands for 1 minute with supervision Reaching forward with outstretched arm: 3.     Reaches forward 5 inches Retrieving object from the floor: 3.     Able to pick up with supervision Turning to look behind: 2.     Turns sideways only, maintains balance Turning 360 degrees: 2.     Able to turn slowly, but safely Place alternate foot on stool: 2.     4 steps without assistance/supervision Standing with one foot in front: 1.  Needs help to step, but can hold for 15 seconds Standing on one foot: 1.     Holds <3 seconds   Total Score: 36/56    TODAY'S TREATMENT  [facility] Adult PT Treatment:                                                DATE: 10/24/21 Therapeutic Exercise: Nustep L5 LE only x 5 minutes  Ball squeeze 5 sec x15 Clamshell 2x10 BluTB Bridge 10 x 2 LTR x5 5" SLR 10 x 2 each  Sidelying clam x 15 each, reverse x 10 needs assist  Supine red band horiz abdct x15 Supine red band shoulder ER x 15   [facility] Adult PT Treatment:                                                DATE: 10/22/21 Therapeutic Exercise: Nustep L5 LE only x 5 minutes  Parallel bars: side stepping 4 passes, standing march, heel raises, hip abduction x 10 each x 2 , tandem trails <5 sec, SLS with 1 UE support.  STS x 10 without UE elevated mat, cues for hip hinge Gait in clinic 150 feet with RW and cues Seated toe raises  Seated bilateral shoulder scaption AROM  Clamshell 2x10 BluTB Bridge 10 x 2 LTR x5 5" SLR 10 x 2 each  Sidelying clam x 15 each, reverse x 10 needs assist    [facility] Adult PT Treatment:                                                DATE: 10/17/21 Therapeutic Exercise:  STS x 10 without UE elevated mat, cues for hip hinge Sidelying clam x 10 each Sidelying hip abduction- x 10 each -min  lift -poor glute medius recruitment  Clamshell 2x10 BluTB SLR 10 x 2 each  Bridge 10 x 2 LTR x5 5" Standing hip flexor stretch at RW 2 x 20 sec each Standing at RW: heel raise x 10, toe raises alternating -decreased ROM on right Tandem stance 12 sec best  Seated toe raises Seated bilateral shoulder scaption AROM  Gait in clinic 150 with RW and cues .     [facility] Adult PT Treatment:                                                DATE: 10/15/21 Therapeutic Exercise: Nustep 5 mins L5 UE/LE STS c use of hands 2x5 LAQ 2x10 6# Marching 2x10 6# SLR 2x10 6# LTR x5 5" Piriformis stretch, x2, 20" Bridging 2x10 decreased core stability Clamshell 2x10 BluTB Therapeutic Activity: Standing balance: narrow base, tandem. Use of hands as needed. SBA provided. Self Care: LAQ's and marching for warming up hips and knees prior to standing after sitting for extended time frames       PATIENT EDUCATION:  Education details: POC Explanation of findings.  BERG 36/56, need for walker full time until stronger, fall prevention handout  and discussion. Emphasis on need for full time walker since pt cruises wall while using cane, Initial HEP issued Person educated: Patient and sister of pt Gust Salz Education method: Explanation, Demonstration, Tactile cues, Verbal cues, and Handouts Education comprehension: verbalized understanding, returned demonstration, verbal cues required, and needs further education     HOME EXERCISE PROGRAM: Access Code: [email or web address] URL: [email or web address] Date: 10/10/2021 Prepared by: Gar Ponto  Program Notes sit to stand as movement snack before you  eat  breakfast , lunch and dinner   Exercises Sit to Stand with Counter Support - 3 x daily - 7 x weekly - 1 sets - 10 reps Supine Lower Trunk Rotation - 1 x daily - 7 x weekly - 3 sets - 10 reps Seated Hamstring Stretch - 1-2 x daily - 7 x weekly - 1 sets - 2-3 reps - 30 hold Standing March with  Counter Support - 1 x daily - 7 x weekly - 2 sets - 10 reps Supine Bridge - 1 x daily - 7 x weekly - 2 sets - 10 reps - 3 hold Hooklying Clamshell with Resistance - 1 x daily - 7 x weekly - 2 sets - 10 reps - 3 hold Supine Straight Leg Raises - 1 x daily - 7 x weekly - 2 sets - 10 reps - 3 hold       ASSESSMENT:   CLINICAL IMPRESSION: Pt reports he almost canceled his appt today due to not feeling well. Treatment focused on mat based strengthening due to patient reports of weakness today. He had increased let hip pain with sidelying strengthening today. He tolerated supine shoulder strengthening without increased pain.   Objective impairments include cardiopulmonary status limiting activity, decreased activity tolerance, decreased balance, decreased endurance, decreased knowledge of use of DME, decreased mobility, difficulty walking, decreased ROM, decreased strength, decreased safety awareness, hypomobility, impaired flexibility, impaired UE functional use, improper body mechanics, postural dysfunction, and pain. These impairments are limiting patient from  ADL's safe transfer and ambulation . Personal factors including  HTN, HLD, CKD 3B, DM2, gout, COPD Chronic hx of lumbar stenosis. are also affecting patient's functional outcome. Patient will benefit from skilled PT to address above impairments and improve overall function.   REHAB POTENTIAL: Fair   CLINICAL DECISION MAKING: Evolving/moderate complexity   EVALUATION COMPLEXITY: Moderate     GOALS: Goals reviewed with patient? Yes   SHORT TERM GOALS:   STG Name Target Date Goal status  1 Pt independent with intial HEP Baseline:  10/29/2021 INITIAL  2 Demonstrate symmetry with gait on level surface with use of a cane on the LT Baseline: Pt with decreased stance length on R and wide based gait with trendelenberg gait 10/29/2021 INITIAL  3 Sit and stand with RT=LT wt bearing to reduce lumbar strain with transfers Baseline:Pt bears weight  to left with R LE more anterior to bear wt in L LE 10/29/2021 INITIAL  4 Pt will be able to perform 5 x STS without use of UE Baseline: 30 sec STS with heavy dependence of bil UE 10/29/2021 INITIAL  5 Pt/sister will be educated on Fall prevention and given handout tools to prepare home for decrease risk of fall Baseline: limited knowledge 10/29/2021 INITIAL    LONG TERM GOALS:    LTG Name Target Date Goal status  1 Pt will be independent with advanced HEP.  Baseline: no knowledge 11/26/2021 INITIAL  2 Sit and stand with RT=LT wt bearing to reduce lumbar strain and allow for increased  tolerance for these positions for work tasks Baseline: wt bears to Left 11/26/2021 INITIAL  3 FOTO will improve balance from  44%   to  51%   indicating improved functional mobility .  Baseline: eval 44% balance 11/26/2021 INITIAL  4 Pt will improve L/R knee extensor/flexor strength to >/= 4+/5 with </= 2/10 pain to promote safety with walking/standing activities Baseline: Pt with 4-/5 bil LE strength or less See flow sheet 11/26/2021 INITIAL  5 Pt will demonstrate/simulate ability transfer in and out of bathtub with safety Baseline: Pt with difficulty bathing due to pain in bil legs 11/26/2021 INITIAL  6 Pt will walk for 750 feet with LRAD without excessie fatigue Baseline: 6 MWT TBA 11/26/2021 INITIAL  7 Pt will be able to negotiate stairs without exacerbating pain. Baseline:  Pt with pain up to 8/10 on steps 11/26/2021 INITIAL    PLAN: PT FREQUENCY: 2x/week   PT DURATION: 8 weeks   PLANNED INTERVENTIONS: Therapeutic exercises, Therapeutic activity, Neuro Muscular re-education, Balance training, Gait training, Patient/Family education, Joint mobilization, Stair training, DME instructions, Dry Needling, Electrical stimulation, Spinal mobilization, Cryotherapy, Moist heat, Taping, Ionotophoresis 4mg /ml Dexamethasone, and Manual therapy   PLAN FOR NEXT SESSION: add ball squeeze;  Gait training review with walker,  continue  strengthening of bil LE for fall prevention and safe ambulation.  Pt would like to eventually use a cane full time but for now BERG was 39/56 and needs walker due to holding onto walls while entering clinic.     Hessie Diener, PTA 10/24/21 10:19 AM Phone: [phone] Fax: [phone]

## 2021-10-29 ENCOUNTER — Other Ambulatory Visit: Payer: Self-pay

## 2021-10-29 ENCOUNTER — Ambulatory Visit: Payer: Medicare Other

## 2021-10-29 DIAGNOSIS — G8929 Other chronic pain: Secondary | ICD-10-CM | POA: Diagnosis not present

## 2021-10-29 DIAGNOSIS — M6281 Muscle weakness (generalized): Secondary | ICD-10-CM | POA: Diagnosis not present

## 2021-10-29 DIAGNOSIS — M79604 Pain in right leg: Secondary | ICD-10-CM | POA: Diagnosis not present

## 2021-10-29 DIAGNOSIS — R296 Repeated falls: Secondary | ICD-10-CM

## 2021-10-29 DIAGNOSIS — M25511 Pain in right shoulder: Secondary | ICD-10-CM | POA: Diagnosis not present

## 2021-10-29 DIAGNOSIS — M79605 Pain in left leg: Secondary | ICD-10-CM

## 2021-10-29 NOTE — Therapy (Signed)
OUTPATIENT PHYSICAL THERAPY TREATMENT NOTE   Patient Name: Luis Carter MRN: [id number] DOB:05/29/1946, 76 y.o., male Today's Date: 10/29/2021  PCP: Nolene Ebbs, MD REFERRING PROVIDER: Nolene Ebbs, MD   PT End of Session - 10/29/21 0955     Visit Number 8    Number of Visits 16    Date for PT Re-Evaluation 01/21/22    Authorization Type [employer] MCR/MCD    PT Start Time [phone]    PT Stop Time 1017    PT Time Calculation (min) 43 min    Activity Tolerance Patient tolerated treatment well    Behavior During Therapy [facility] for tasks assessed/performed                 Past Medical History:  Diagnosis Date   Arthritis    "left arm/shoulder; both legs" (12/12/2015)   ASTHMA    since childhood   Cardiomyopathy ([place]) 01/25/2016   Echo 12/13/15 - Mild LVH, EF 45-50%, normal wall motion, grade 1 diastolic dysfunction, mild LAE, mild RVE, mild RAE   COPD 04/14/2007   Coronary artery disease    DIABETES MELLITUS, TYPE II 04/14/2007   GERD (gastroesophageal reflux disease)    GI bleed    Gout    High cholesterol    History of nuclear stress test    a.  Myoview 6/17: EF 54%, no ischemia   HYPERTENSION 04/14/2007   LEG PAIN, RIGHT 05/19/2008   LOW BACK PAIN 04/14/2007   Myocardial infarction ([place])    ~ 2001/notes 09/13/2001 (03/22/2013)   PANCREATITIS, HX OF 04/14/2007   Pneumonia    "twice" (12/12/2015)   SEIZURE DISORDER 04/14/2007   "used to have them when I was young" (03/22/2013)   Shortness of breath    "related to asthma" (03/22/2013)   Past Surgical History:  Procedure Laterality Date   CORONARY ANGIOPLASTY WITH STENT PLACEMENT     ESOPHAGOGASTRODUODENOSCOPY  12/21/2011   Procedure: ESOPHAGOGASTRODUODENOSCOPY (EGD);  Surgeon: Inda Castle, MD;  Location: [facility];  Service: Endoscopy;  Laterality: N/A;   Patient Active Problem List   Diagnosis Date Noted   Physical debility 09/17/2021   Acute right hip pain 09/16/2021   HLD (hyperlipidemia) 09/15/2021    COVID-19 virus infection 09/15/2021   Acute exacerbation of chronic obstructive pulmonary disease (COPD) ([place]) 03/11/2021   SIRS (systemic inflammatory response syndrome) ([place]) 03/11/2021   Malnutrition of moderate degree 02/11/2021   Hyperkalemia 02/10/2021   Dyspnea 02/08/2021   Community acquired pneumonia of right upper lobe of lung    Respiratory failure with hypoxia and hypercapnia ([place]) 10/11/2018   Acute pain of left shoulder 10/15/2016   Pain in right leg 10/15/2016   Weakness of right leg 10/15/2016   Cardiomyopathy ([place]) 01/25/2016   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, stage 3b ([place]) 12/13/2015   Abnormal echocardiogram 12/13/2015   Syncope 12/12/2015   Tobacco use disorder 09/07/2015   Gout 01/27/2012   Cocaine use 12/22/2011   Acute chest pain 12/18/2008   Diabetes mellitus with renal complications ([place]) 123XX123   Hypertension 04/14/2007   Asthma 04/14/2007   COPD (chronic obstructive pulmonary disease) ([place]) 04/14/2007   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 Back pain since2008, leg pain Right since 2009 MI, pacreatitis. Chronic hx of lumbar stenosis. See extensive medical history  PRECAUTIONS: Fall  SUBJECTIVE: Pt reports his knees feel good today. His low back is bothering hima little.   Pt reports shoulder is not painful today and no difficulty sleeping last night. He reports increased bilateral knee pain today at 8/10 and he attributes the  pain to the weather. He also reports feeling weak and sluggish today.   PAIN:  Are you having pain? Yes NPRS scale: 0/10 in back; L knee 8/10; R knee 8/10, 0/10 right shoulder Pain location:  right, left knee  Pain orientation: anterior PAIN TYPE: aching, sharp, and tight Pain description: intermittent  Aggravating factors: sleep  Relieving factors: resting.     OBJECTIVE:    DIAGNOSTIC FINDINGS:  09-18-21 No signs of fracture or dislocation. Mild degenerative changes about the LEFT and RIGHT hip. Degenerative changes in the lumbar spine. Soft tissues are grossly normal.   IMPRESSION: Mild degenerative changes without acute osseous abnormality.   PATIENT SURVEYS:  FOTO back 51%  predicted 54%   balance 44% predicted 51%    SCREENING FOR RED FLAGS: Bowel or bladder incontinence: No     COGNITION:          Overall cognitive status: Within functional limits for tasks assessed                        SENSATION:          Light touch: Appears intact          Stereognosis: Appears intact          Hot/Cold: Appears intact          Proprioception: Appears intact   MUSCLE LENGTH: Hamstrings: Right 50 deg; Left 54 deg Thomas test: bil hip flexor tightness   POSTURE:  Pt with flexed posture, rounded shoulders and forward neck   PALPATION: Pt with bil lumbar tightness, TTP bil hip post to greater troch, No TTP for R shoulder but limited range, Pt with complaint of bil knees but minimal tenderness to palpation on knees globally   LUMBARAROM/PROM   A/PROM A/PROM  10/01/2021  Flexion 50% fingertips to mid shin  Extension  10% pain with ext  Right lateral flexion 50%  Left lateral flexion 50%   Right rotation 75%  Left rotation 50%   (Blank rows = not tested)   LE AROM/PROM:                               All WNL except noted A/PROM Right 10/01/2021 Left 10/01/2021  Hip flexion      Hip extension      Hip abduction      Hip adduction      Hip internal rotation 25 35   Hip external rotation 35 49  Knee flexion      Knee extension      Ankle dorsiflexion      Ankle plantarflexion      Ankle inversion      Ankle eversion       (Blank rows = not tested)   LE MMT:   MMT Right 10/01/2021 Left 10/01/2021 Right  10/17/21 Left 10/17/21  Hip flexion 4-/5 4-/5 4+/5 4+/5  Hip extension 4-/5 4-/5    Hip abduction 3+/5 3+/5    Hip adduction        Hip internal rotation        Hip external rotation        Knee flexion 4-/5 4/5    Knee extension 4-/5 4/5    Ankle dorsiflexion 4-/5 4-/5 3+/5 4/5  Ankle plantarflexion        Ankle inversion        Ankle eversion         (Blank rows = not tested)   UPPER EXTREMITY AROM/PROM:   A/PROM Right 10/01/2021 Left 10/01/2021 Right Left  Shoulder flexion 135 143 145   Shoulder extension        Shoulder abduction 130 140 145   Shoulder adduction        Shoulder internal rotation     Mid lumbar reach   Shoulder external rotation     T2 reach   Elbow flexion        Elbow extension        Wrist flexion        Wrist extension        Wrist ulnar deviation        Wrist radial deviation        Wrist pronation        Wrist supination        (Blank rows = not tested)   UPPER EXTREMITY MMT: Grossly 4 to 4+/5 throughout MMT Right 10/01/2021 Left 10/01/2021 Right 10/17/21  Shoulder flexion     4+/5  Shoulder extension       Shoulder abduction     4-/5  Shoulder adduction       Shoulder ER   4+/5  Shoulder IR   5/5  Middle trapezius       Lower trapezius       Elbow flexion       Elbow extension       Wrist flexion       Wrist extension       Wrist ulnar deviation       Wrist radial deviation       Wrist pronation       Wrist supination       Grip strength (lbs)       (Blank rows = not tested)     LUMBAR SPECIAL TESTS:  Straight leg raise test: Negative and Slump test: Negative   FUNCTIONAL TESTS:  30 sec STS  6 x must use hands   GAIT: Distance walked: 130 Assistive device utilized: Single point  cane Level of assistance: SBA Comments: 2.01 ft/sec   BERG BALANCE TEST Sitting to Standing: 3.      Stands independently using hands Standing Unsupported: 3.      Stands 2 minutes with supervision Sitting Unsupported: 4.     Sits for 2 minutes independently Standing to Sitting: 3.     Controls descent with hands  Transfers: 3.     Transfers safely definite use of hands Standing with eyes closed: 3.     Stands 10 seconds with supervision Standing with feet together: 3.     Stands for 1 minute with supervision Reaching forward with outstretched arm: 3.     Reaches forward 5 inches Retrieving object from the floor: 3.     Able to pick up with supervision Turning to look behind: 2.  Turns sideways only, maintains balance Turning 360 degrees: 2.     Able to turn slowly, but safely Place alternate foot on stool: 2.     4 steps without assistance/supervision Standing with one foot in front: 1.     Needs help to step, but can hold for 15 seconds Standing on one foot: 1.     Holds <3 seconds   Total Score: 36/56    TODAY'S TREATMENT  [facility] Adult PT Treatment:                                                DATE: 10/29/21 Therapeutic Exercise: Ball squeeze 5 sec x15 Clamshell 2x10 BluTB Bridge 10 x 2 LTR x5 5" SLR 8 x 2 each, 5# Bridging 2x5 Supine red band horiz abdct x15 Supine red band shoulder ER x 15 Standing hip abd, 2x10, each Therapeutic Activity: Standing balance: feet apart and feet together, pt is able to standing with feet together c 1 min c eyes open, but does not feel confident with PT not standing beside him.  [facility] Adult PT Treatment:                                                DATE: 10/24/21 Therapeutic Exercise: Nustep L5 LE only x 5 minutes  Ball squeeze 5 sec x15 Clamshell 2x10 BluTB Bridge 10 x 2 LTR x5 5" SLR 10 x 2 each  Sidelying clam x 15 each, reverse x 10 needs assist  Supine red band horiz abdct x15 Supine red band shoulder ER x 15   [facility] Adult PT  Treatment:                                                DATE: 10/22/21 Therapeutic Exercise: Nustep L5 LE only x 5 minutes  Parallel bars: side stepping 4 passes, standing march, heel raises, hip abduction x 10 each x 2 , tandem trails <5 sec, SLS with 1 UE support.  STS x 10 without UE elevated mat, cues for hip hinge Gait in clinic 150 feet with RW and cues Seated toe raises  Seated bilateral shoulder scaption AROM  Clamshell 2x10 BluTB Bridge 10 x 2 LTR x5 5" SLR 10 x 2 each  Sidelying clam x 15 each, reverse x 10 needs assist    PATIENT EDUCATION:  Education details: POC Explanation of findings.  BERG 36/56, need for walker full time until stronger, fall prevention handout and discussion. Emphasis on need for full time walker since pt cruises wall while using cane, Initial HEP issued Person educated: Patient and sister of pt Antoinio Gobbi Education method: Explanation, Demonstration, Tactile cues, Verbal cues, and Handouts Education comprehension: verbalized understanding, returned demonstration, verbal cues required, and needs further education     HOME EXERCISE PROGRAM: Access Code: [email or web address] URL: [email or web address] Date: 10/10/2021 Prepared by: Gar Ponto  Program Notes sit to stand as movement snack before you  eat  breakfast , lunch and dinner   Exercises Sit to Stand with Counter Support - 3 x daily - 7 x weekly - 1 sets -  10 reps Supine Lower Trunk Rotation - 1 x daily - 7 x weekly - 3 sets - 10 reps Seated Hamstring Stretch - 1-2 x daily - 7 x weekly - 1 sets - 2-3 reps - 30 hold Standing March with Counter Support - 1 x daily - 7 x weekly - 2 sets - 10 reps Supine Bridge - 1 x daily - 7 x weekly - 2 sets - 10 reps - 3 hold Hooklying Clamshell with Resistance - 1 x daily - 7 x weekly - 2 sets - 10 reps - 3 hold Supine Straight Leg Raises - 1 x daily - 7 x weekly - 2 sets - 10 reps - 3 hold       ASSESSMENT:   CLINICAL IMPRESSION: PT was  completed for LE and shoulder strengthening and balance activities. With narrow stance pt was able to stand for 60 sec c min corrective adjustments. Pt notes he doe not feel confident in this position and wanted this therapist for close supervision. Pt participated well with PT. Due to continued balance issues, use of the RW for ambulation was recommended. Will reassess some of the STGs the next PT session.    Objective impairments include cardiopulmonary status limiting activity, decreased activity tolerance, decreased balance, decreased endurance, decreased knowledge of use of DME, decreased mobility, difficulty walking, decreased ROM, decreased strength, decreased safety awareness, hypomobility, impaired flexibility, impaired UE functional use, improper body mechanics, postural dysfunction, and pain. These impairments are limiting patient from  ADL's safe transfer and ambulation . Personal factors including  HTN, HLD, CKD 3B, DM2, gout, COPD Chronic hx of lumbar stenosis. are also affecting patient's functional outcome. Patient will benefit from skilled PT to address above impairments and improve overall function.   REHAB POTENTIAL: Fair   CLINICAL DECISION MAKING: Evolving/moderate complexity   EVALUATION COMPLEXITY: Moderate     GOALS: Goals reviewed with patient? Yes   SHORT TERM GOALS:   STG Name Target Date Goal status  1 Pt independent with intial HEP Baseline:  10/29/2021 INITIAL  2 Demonstrate symmetry with gait on level surface with use of a cane on the LT Baseline: Pt with decreased stance length on R and wide based gait with trendelenberg gait 10/29/2021 INITIAL  3 Sit and stand with RT=LT wt bearing to reduce lumbar strain with transfers Baseline:Pt bears weight to left with R LE more anterior to bear wt in L LE 10/29/2021 INITIAL  4 Pt will be able to perform 5 x STS without use of UE Baseline: 30 sec STS with heavy dependence of bil UE 10/29/2021 INITIAL  5 Pt/sister will be  educated on Fall prevention and given handout tools to prepare home for decrease risk of fall Baseline: limited knowledge 10/29/2021 INITIAL    LONG TERM GOALS:    LTG Name Target Date Goal status  1 Pt will be independent with advanced HEP.  Baseline: no knowledge 11/26/2021 INITIAL  2 Sit and stand with RT=LT wt bearing to reduce lumbar strain and allow for increased tolerance for these positions for work tasks Baseline: wt bears to Left 11/26/2021 INITIAL  3 FOTO will improve balance from  44%   to  51%   indicating improved functional mobility .  Baseline: eval 44% balance 11/26/2021 INITIAL  4 Pt will improve L/R knee extensor/flexor strength to >/= 4+/5 with </= 2/10 pain to promote safety with walking/standing activities Baseline: Pt with 4-/5 bil LE strength or less See flow sheet 11/26/2021 INITIAL  5 Pt  will demonstrate/simulate ability transfer in and out of bathtub with safety Baseline: Pt with difficulty bathing due to pain in bil legs 11/26/2021 INITIAL  6 Pt will walk for 750 feet with LRAD without excessie fatigue Baseline: 6 MWT TBA 11/26/2021 INITIAL  7 Pt will be able to negotiate stairs without exacerbating pain. Baseline:  Pt with pain up to 8/10 on steps 11/26/2021 INITIAL    PLAN: PT FREQUENCY: 2x/week   PT DURATION: 8 weeks   PLANNED INTERVENTIONS: Therapeutic exercises, Therapeutic activity, Neuro Muscular re-education, Balance training, Gait training, Patient/Family education, Joint mobilization, Stair training, DME instructions, Dry Needling, Electrical stimulation, Spinal mobilization, Cryotherapy, Moist heat, Taping, Ionotophoresis 4mg /ml Dexamethasone, and Manual therapy   PLAN FOR NEXT SESSION: add ball squeeze;  Gait training review with walker,  continue strengthening of bil LE for fall prevention and safe ambulation.  Pt would like to eventually use a cane full time but for now BERG was 39/56 and needs walker due to holding onto walls while entering clinic. Reassess  STGs.   Kabe Mckoy MS, PT 10/29/21 11:08 AM

## 2021-10-31 ENCOUNTER — Ambulatory Visit: Payer: Medicare Other | Admitting: Physical Therapy

## 2021-11-05 ENCOUNTER — Ambulatory Visit: Payer: Medicare Other | Admitting: Physical Therapy

## 2021-11-05 ENCOUNTER — Encounter: Payer: Self-pay | Admitting: Physical Therapy

## 2021-11-05 ENCOUNTER — Other Ambulatory Visit: Payer: Self-pay

## 2021-11-05 DIAGNOSIS — M79604 Pain in right leg: Secondary | ICD-10-CM | POA: Diagnosis not present

## 2021-11-05 DIAGNOSIS — G8929 Other chronic pain: Secondary | ICD-10-CM

## 2021-11-05 DIAGNOSIS — M79605 Pain in left leg: Secondary | ICD-10-CM

## 2021-11-05 DIAGNOSIS — R296 Repeated falls: Secondary | ICD-10-CM | POA: Diagnosis not present

## 2021-11-05 DIAGNOSIS — M6281 Muscle weakness (generalized): Secondary | ICD-10-CM

## 2021-11-05 DIAGNOSIS — M25511 Pain in right shoulder: Secondary | ICD-10-CM | POA: Diagnosis not present

## 2021-11-05 NOTE — Therapy (Signed)
?OUTPATIENT PHYSICAL THERAPY TREATMENT NOTE ? ? ?Patient Name: Luis Carter ?MRN: [id number] ?DOB:31-Mar-1946, 76 y.o., male ?Today's Date: 11/05/2021 ? ?PCP: Nolene Ebbs, MD ?REFERRING PROVIDER: Nolene Ebbs, MD ? ? PT End of Session - 11/05/21 0934   ? ? Visit Number 9   ? Number of Visits 16   ? Date for PT Re-Evaluation 01/21/22   ? Authorization Type [facility] MCR/MCD   ? PT Start Time 0930   ? PT Stop Time 1013   ? PT Time Calculation (min) 43 min   ? ?  ?  ? ?  ? ? ? ? ? ? ?Past Medical History:  ?Diagnosis Date  ? Arthritis   ? "left arm/shoulder; both legs" (12/12/2015)  ? ASTHMA   ? since childhood  ? Cardiomyopathy ([place]) 01/25/2016  ? Echo 12/13/15 - Mild LVH, EF 45-50%, normal wall motion, grade 1 diastolic dysfunction, mild LAE, mild RVE, mild RAE  ? COPD 04/14/2007  ? Coronary artery disease   ? DIABETES MELLITUS, TYPE II 04/14/2007  ? GERD (gastroesophageal reflux disease)   ? GI bleed   ? Gout   ? High cholesterol   ? History of nuclear stress test   ? a.  Myoview 6/17: EF 54%, no ischemia  ? HYPERTENSION 04/14/2007  ? LEG PAIN, RIGHT 05/19/2008  ? LOW BACK PAIN 04/14/2007  ? Myocardial infarction [facility])   ? ~ 2001/notes 09/13/2001 (03/22/2013)  ? PANCREATITIS, HX OF 04/14/2007  ? Pneumonia   ? "twice" (12/12/2015)  ? SEIZURE DISORDER 04/14/2007  ? "used to have them when I was young" (03/22/2013)  ? Shortness of breath   ? "related to asthma" (03/22/2013)  ? ?Past Surgical History:  ?Procedure Laterality Date  ? CORONARY ANGIOPLASTY WITH STENT PLACEMENT    ? ESOPHAGOGASTRODUODENOSCOPY  12/21/2011  ? Procedure: ESOPHAGOGASTRODUODENOSCOPY (EGD);  Surgeon: Inda Castle, MD;  Location: [place];  Service: Endoscopy;  Laterality: N/A;  ? ?Patient Active Problem List  ? Diagnosis Date Noted  ? Physical debility 09/17/2021  ? Acute right hip pain 09/16/2021  ? HLD (hyperlipidemia) 09/15/2021  ? COVID-19 virus infection 09/15/2021  ? Acute exacerbation of chronic obstructive pulmonary disease (COPD) ([place]) 03/11/2021   ? SIRS (systemic inflammatory response syndrome) ([place]) 03/11/2021  ? Malnutrition of moderate degree 02/11/2021  ? Hyperkalemia 02/10/2021  ? Dyspnea 02/08/2021  ? Community acquired pneumonia of right upper lobe of lung   ? Respiratory failure with hypoxia and hypercapnia ([place]) 10/11/2018  ? Acute pain of left shoulder 10/15/2016  ? Pain in right leg 10/15/2016  ? Weakness of right leg 10/15/2016  ? Cardiomyopathy ([place]) 01/25/2016  ? 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, stage 3b ([place]) 12/13/2015  ? Abnormal echocardiogram 12/13/2015  ? Syncope 12/12/2015  ? Tobacco use disorder 09/07/2015  ? Gout 01/27/2012  ? Cocaine use 12/22/2011  ? Acute chest pain 12/18/2008  ? Diabetes mellitus with renal complications ([place]) 123XX123  ? Hypertension 04/14/2007  ? Asthma 04/14/2007  ? COPD (chronic obstructive pulmonary disease) ([place]) 04/14/2007  ? 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 Seizures ([place]) 04/14/2007  ? PANCREATITIS, HX OF 04/14/2007  ? ? ?REFERRING DIAG:  ?R53.81 (ICD-10-CM) - Physical debility  ?M25.551 (ICD-10-CM) - Acute right hip pain  ?R29.898 (ICD-10-CM) - Weakness of right leg  ?M79.604 (ICD-10-CM) - Pain in right leg  ?M25.512 (ICD-10-CM) - Acute pain of left shoulder  ? ? ?THERAPY DIAG:  ?Pain in left leg ? ?Repeated falls ? ?Pain in right leg ? ?  Muscle weakness (generalized) ? ?Chronic right shoulder pain ? ?PERTINENT HISTORY: HTN, HLD, CKD 3B, DM2, gout, COPD, prior cocaine use. ?Back pain since2008, leg pain Right since 2009 MI, pacreatitis. Chronic hx of lumbar stenosis. See extensive medical history ? ?PRECAUTIONS: Fall ? ?SUBJECTIVE: Pt reports no pian in back or shoulder. He reports low level pain in knees today. ? ?  ? ?PAIN:  ?Are you having pain? Yes ?NPRS scale: 0/10 in back; L knee 2/10; R knee 2/10, 0/10 right shoulder ?Pain location:  right, left knee  ?Pain orientation: anterior ?PAIN TYPE: aching, sharp, and tight ?Pain description: intermittent  ?Aggravating factors: sleep   ?Relieving factors: resting. ? ? ? ? ?OBJECTIVE:  ?  ?DIAGNOSTIC FINDINGS:  ?09-18-21 ?No signs of fracture or dislocation. Mild degenerative changes about ?the LEFT and RIGHT hip. Degenerative changes in the lumbar spine. ?Soft tissues are grossly normal. ?  ?IMPRESSION: ?Mild degenerative changes without acute osseous abnormality. ?  ?PATIENT SURVEYS:  ?FOTO back 51%  predicted 54%   balance 44% predicted 51%  ?  ?SCREENING FOR RED FLAGS: ?Bowel or bladder incontinence: No ?  ?  ?COGNITION: ?         Overall cognitive status: Within functional limits for tasks assessed              ?          ?SENSATION: ?         Light touch: Appears intact ?         Stereognosis: Appears intact ?         Hot/Cold: Appears intact ?         Proprioception: Appears intact ?  ?MUSCLE LENGTH: ?Hamstrings: Right 50 deg; Left 54 deg ?Thomas test: bil hip flexor tightness ?  ?POSTURE:  ?Pt with flexed posture, rounded shoulders and forward neck ?  ?PALPATION: ?Pt with bil lumbar tightness, TTP bil hip post to greater troch, No TTP for R shoulder but limited range, Pt with complaint of bil knees but minimal tenderness to palpation on knees globally ?  ?LUMBARAROM/PROM ?  ?A/PROM A/PROM  ?10/01/2021  ?Flexion 50% fingertips to mid shin  ?Extension  10% pain with ext  ?Right lateral flexion 50%  ?Left lateral flexion 50%   ?Right rotation 75%  ?Left rotation 50%  ? (Blank rows = not tested) ?  ?LE AROM/PROM: ?                              All WNL except noted ?A/PROM Right ?10/01/2021 Left ?10/01/2021  ?Hip flexion      ?Hip extension      ?Hip abduction      ?Hip adduction      ?Hip internal rotation 25 35  ?Hip external rotation 35 49  ?Knee flexion      ?Knee extension      ?Ankle dorsiflexion      ?Ankle plantarflexion      ?Ankle inversion      ?Ankle eversion      ? (Blank rows = not tested) ?  ?LE MMT: ?  ?MMT Right ?10/01/2021 Left ?10/01/2021 Right ?10/17/21 Left ?10/17/21 Right ?11/05/21 Left  ?11/05/21  ?Hip flexion 4-/5 4-/5 4+/5 4+/5     ?Hip extension 4-/5 4-/5      ?Hip abduction 3+/5 3+/5      ?Hip adduction          ?Hip internal rotation          ?  Hip external rotation          ?Knee flexion 4-/5 4/5   4+/5 4+/5  ?Knee extension 4-/5 4/5   4+/5 4+/5  ?Ankle dorsiflexion 4-/5 4-/5 3+/5 4/5    ?Ankle plantarflexion          ?Ankle inversion          ?Ankle eversion          ? (Blank rows = not tested) ?  ?UPPER EXTREMITY AROM/PROM: ?  ?A/PROM Right ?10/01/2021 Left ?10/01/2021 Right Left  ?Shoulder flexion 135 143 145   ?Shoulder extension        ?Shoulder abduction 130 140 145   ?Shoulder adduction        ?Shoulder internal rotation     Mid lumbar reach   ?Shoulder external rotation     T2 reach   ?Elbow flexion        ?Elbow extension        ?Wrist flexion        ?Wrist extension        ?Wrist ulnar deviation        ?Wrist radial deviation        ?Wrist pronation        ?Wrist supination        ?(Blank rows = not tested) ?  ?UPPER EXTREMITY MMT: ?Grossly 4 to 4+/5 throughout ?MMT Right ?10/01/2021 Left ?10/01/2021 Right ?10/17/21  ?Shoulder flexion     4+/5  ?Shoulder extension       ?Shoulder abduction     4-/5  ?Shoulder adduction       ?Shoulder ER   4+/5  ?Shoulder IR   5/5  ?Middle trapezius       ?Lower trapezius       ?Elbow flexion       ?Elbow extension       ?Wrist flexion       ?Wrist extension       ?Wrist ulnar deviation       ?Wrist radial deviation       ?Wrist pronation       ?Wrist supination       ?Grip strength (lbs)       ?(Blank rows = not tested) ?  ?  ?LUMBAR SPECIAL TESTS:  ?Straight leg raise test: Negative and Slump test: Negative ?  ?FUNCTIONAL TESTS:  ?30 sec STS  6 x must use hands ?11/05/21: 5 x STS 26.9 sec from mat without UE ?  ?GAIT: ?Distance walked: 130 ?Assistive device utilized: Single point cane ?Level of assistance: SBA ?Comments: 2.01 ft/sec ?  ?BERG BALANCE TEST ?Sitting to Standing: 3.      Stands independently using hands ?Standing Unsupported: 3.      Stands 2 minutes with supervision ?Sitting Unsupported: 4.      Sits for 2 minutes independently ?Standing to Sitting: 3.     Controls descent with hands  ?Transfers: 3.     Transfers safely definite use of hands ?Standing with eyes closed: 3.     Stands 10 seconds with

## 2021-11-06 NOTE — Therapy (Signed)
?OUTPATIENT PHYSICAL THERAPY TREATMENT NOTE/Progress Note ? ? ?Patient Name: Luis Carter ?MRN: [id number] ?DOB:12-08-1945, 76 y.o., male ?Today's Date: 11/07/2021 ? ?Progress Note ?Reporting Period 10/01/21 to 11/07/21 ? ?See note below for Objective Data and Assessment of Progress/Goals.  ? ?  ? ?PCP: Nolene Ebbs, MD ?REFERRING PROVIDER: Jonetta Osgood, MD ? ? PT End of Session - 11/07/21 1001   ? ? Visit Number 10   ? Number of Visits 16   ? Date for PT Re-Evaluation 01/21/22   ? Authorization Type [facility] MCR/MCD   ? PT Start Time 0940   ? PT Stop Time 1020   ? PT Time Calculation (min) 40 min   ? Activity Tolerance Patient tolerated treatment well   ? Behavior During Therapy [facility] for tasks assessed/performed   ? ?  ?  ? ?  ? ? ? ? ? ? ? ?Past Medical History:  ?Diagnosis Date  ? Arthritis   ? "left arm/shoulder; both legs" (12/12/2015)  ? ASTHMA   ? since childhood  ? Cardiomyopathy ([place]) 01/25/2016  ? Echo 12/13/15 - Mild LVH, EF 45-50%, normal wall motion, grade 1 diastolic dysfunction, mild LAE, mild RVE, mild RAE  ? COPD 04/14/2007  ? Coronary artery disease   ? DIABETES MELLITUS, TYPE II 04/14/2007  ? GERD (gastroesophageal reflux disease)   ? GI bleed   ? Gout   ? High cholesterol   ? History of nuclear stress test   ? a.  Myoview 6/17: EF 54%, no ischemia  ? HYPERTENSION 04/14/2007  ? LEG PAIN, RIGHT 05/19/2008  ? LOW BACK PAIN 04/14/2007  ? Myocardial infarction [facility])   ? ~ 2001/notes 09/13/2001 (03/22/2013)  ? PANCREATITIS, HX OF 04/14/2007  ? Pneumonia   ? "twice" (12/12/2015)  ? SEIZURE DISORDER 04/14/2007  ? "used to have them when I was young" (03/22/2013)  ? Shortness of breath   ? "related to asthma" (03/22/2013)  ? ?Past Surgical History:  ?Procedure Laterality Date  ? CORONARY ANGIOPLASTY WITH STENT PLACEMENT    ? ESOPHAGOGASTRODUODENOSCOPY  12/21/2011  ? Procedure: ESOPHAGOGASTRODUODENOSCOPY (EGD);  Surgeon: Inda Castle, MD;  Location: [place];  Service: Endoscopy;  Laterality: N/A;  ? ?Patient  Active Problem List  ? Diagnosis Date Noted  ? Physical debility 09/17/2021  ? Acute right hip pain 09/16/2021  ? HLD (hyperlipidemia) 09/15/2021  ? COVID-19 virus infection 09/15/2021  ? Acute exacerbation of chronic obstructive pulmonary disease (COPD) ([place]) 03/11/2021  ? SIRS (systemic inflammatory response syndrome) ([place]) 03/11/2021  ? Malnutrition of moderate degree 02/11/2021  ? Hyperkalemia 02/10/2021  ? Dyspnea 02/08/2021  ? Community acquired pneumonia of right upper lobe of lung   ? Respiratory failure with hypoxia and hypercapnia ([place]) 10/11/2018  ? Acute pain of left shoulder 10/15/2016  ? Pain in right leg 10/15/2016  ? Weakness of right leg 10/15/2016  ? Cardiomyopathy ([place]) 01/25/2016  ? 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 Abnormal echocardiogram 12/13/2015  ? Syncope 12/12/2015  ? Tobacco use disorder 09/07/2015  ? Gout 01/27/2012  ? Cocaine use 12/22/2011  ? Acute chest pain 12/18/2008  ? 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 Hypertension 04/14/2007  ? Asthma 04/14/2007  ? COPD (chronic obstructive pulmonary disease) ([place]) 04/14/2007  ? 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 Seizures ([place]) 04/14/2007  ? PANCREATITIS, HX OF 04/14/2007  ? ? ?REFERRING DIAG:  ?R53.81 (ICD-10-CM) - Physical debility  ?M25.551 (ICD-10-CM) - Acute right hip  pain  ?R29.898 (ICD-10-CM) - Weakness of right leg  ?M79.604 (ICD-10-CM) - Pain in right leg  ?M25.512 (ICD-10-CM) - Acute pain of left shoulder  ? ? ?THERAPY DIAG:  ?Pain in left leg ? ?Pain in right leg ? ?Repeated falls ? ?Muscle weakness (generalized) ? ?PERTINENT HISTORY: HTN, HLD, CKD 3B, DM2, gout, COPD, prior cocaine use. ?Back pain since2008, leg pain Right since 2009 MI, pacreatitis. Chronic hx of lumbar stenosis. See extensive medical history ? ?PRECAUTIONS: Fall ? ?SUBJECTIVE: Pt reports his mobility and pain have improved with PT. ? ?PAIN:  ?Are you having pain? Yes ?NPRS scale: 0/10 in back; L  knee 0/10; R knee 0/10, 0/10 right shoulder ?Pain location:  right, left knee  ?Pain orientation: anterior ?PAIN TYPE: aching, sharp, and tight ?Pain description: intermittent  ?Aggravating factors: sleep  ?Relieving factors: resting. ? ? ? ? ?OBJECTIVE:  ?  ?DIAGNOSTIC FINDINGS:  ?09-18-21 ?No signs of fracture or dislocation. Mild degenerative changes about ?the LEFT and RIGHT hip. Degenerative changes in the lumbar spine. ?Soft tissues are grossly normal. ?  ?IMPRESSION: ?Mild degenerative changes without acute osseous abnormality. ?  ?PATIENT SURVEYS:  ?FOTO back 51%  predicted 54%   balance 44% predicted 51%  ?  ?SCREENING FOR RED FLAGS: ?Bowel or bladder incontinence: No ?  ?  ?COGNITION: ?         Overall cognitive status: Within functional limits for tasks assessed              ?          ?SENSATION: ?         Light touch: Appears intact ?         Stereognosis: Appears intact ?         Hot/Cold: Appears intact ?         Proprioception: Appears intact ?  ?MUSCLE LENGTH: ?Hamstrings: Right 50 deg; Left 54 deg ?Thomas test: bil hip flexor tightness ?  ?POSTURE:  ?Pt with flexed posture, rounded shoulders and forward neck ?  ?PALPATION: ?Pt with bil lumbar tightness, TTP bil hip post to greater troch, No TTP for R shoulder but limited range, Pt with complaint of bil knees but minimal tenderness to palpation on knees globally ?  ?LUMBARAROM/PROM ?  ?A/PROM A/PROM  ?10/01/2021  ?Flexion 50% fingertips to mid shin  ?Extension  10% pain with ext  ?Right lateral flexion 50%  ?Left lateral flexion 50%   ?Right rotation 75%  ?Left rotation 50%  ? (Blank rows = not tested) ?  ?LE AROM/PROM: ?                              All WNL except noted ?A/PROM Right ?10/01/2021 Left ?10/01/2021  ?Hip flexion      ?Hip extension      ?Hip abduction      ?Hip adduction      ?Hip internal rotation 25 35  ?Hip external rotation 35 49  ?Knee flexion      ?Knee extension      ?Ankle dorsiflexion      ?Ankle plantarflexion      ?Ankle inversion       ?Ankle eversion      ? (Blank rows = not tested) ?  ?LE MMT: ?  ?MMT Right ?10/01/2021 Left ?10/01/2021 Right ?10/17/21 Left ?10/17/21 Right ?11/05/21 Left  ?11/05/21  ?Hip flexion 4-/5 4-/5 4+/5 4+/5    ?Hip extension 4-/5  4-/5      ?Hip abduction 3+/5 3+/5      ?Hip adduction          ?Hip internal rotation          ?Hip external rotation          ?Knee flexion 4-/5 4/5   4+/5 4+/5  ?Knee extension 4-/5 4/5   4+/5 4+/5  ?Ankle dorsiflexion 4-/5 4-/5 3+/5 4/5    ?Ankle plantarflexion          ?Ankle inversion          ?Ankle eversion          ? (Blank rows = not tested) ?  ?UPPER EXTREMITY AROM/PROM: ?  ?A/PROM Right ?10/01/2021 Left ?10/01/2021 Right Left  ?Shoulder flexion 135 143 145   ?Shoulder extension        ?Shoulder abduction 130 140 145   ?Shoulder adduction        ?Shoulder internal rotation     Mid lumbar reach   ?Shoulder external rotation     T2 reach   ?Elbow flexion        ?Elbow extension        ?Wrist flexion        ?Wrist extension        ?Wrist ulnar deviation        ?Wrist radial deviation        ?Wrist pronation        ?Wrist supination        ?(Blank rows = not tested) ?  ?UPPER EXTREMITY MMT: ?Grossly 4 to 4+/5 throughout ?MMT Right ?10/01/2021 Left ?10/01/2021 Right ?10/17/21  ?Shoulder flexion     4+/5  ?Shoulder extension       ?Shoulder abduction     4-/5  ?Shoulder adduction       ?Shoulder ER   4+/5  ?Shoulder IR   5/5  ?Middle trapezius       ?Lower trapezius       ?Elbow flexion       ?Elbow extension       ?Wrist flexion       ?Wrist extension       ?Wrist ulnar deviation       ?Wrist radial deviation       ?Wrist pronation       ?Wrist supination       ?Grip strength (lbs)       ?(Blank rows = not tested) ?  ?  ?LUMBAR SPECIAL TESTS:  ?Straight leg raise test: Negative and Slump test: Negative ?  ?FUNCTIONAL TESTS:  ?30 sec STS  6 x must use hands ?11/05/21: 5 x STS 26.9 sec from mat without UE ?  ?GAIT: ?Distance walked: 130 ?Assistive device utilized: Single point cane ?Level of assistance:  SBA ?Comments: 2.01 ft/sec ?  ?BERG BALANCE TEST ?Sitting to Standing: 3.      Stands independently using hands ?Standing Unsupported: 3.      Stands 2 minutes with supervision ?Sitting Unsupported: 4.

## 2021-11-07 ENCOUNTER — Other Ambulatory Visit: Payer: Self-pay

## 2021-11-07 ENCOUNTER — Ambulatory Visit: Payer: Medicare Other

## 2021-11-07 DIAGNOSIS — M6281 Muscle weakness (generalized): Secondary | ICD-10-CM | POA: Diagnosis not present

## 2021-11-07 DIAGNOSIS — G8929 Other chronic pain: Secondary | ICD-10-CM | POA: Diagnosis not present

## 2021-11-07 DIAGNOSIS — M79605 Pain in left leg: Secondary | ICD-10-CM | POA: Diagnosis not present

## 2021-11-07 DIAGNOSIS — R296 Repeated falls: Secondary | ICD-10-CM | POA: Diagnosis not present

## 2021-11-07 DIAGNOSIS — M25511 Pain in right shoulder: Secondary | ICD-10-CM | POA: Diagnosis not present

## 2021-11-07 DIAGNOSIS — M79604 Pain in right leg: Secondary | ICD-10-CM | POA: Diagnosis not present

## 2021-11-12 ENCOUNTER — Ambulatory Visit: Payer: Medicare Other

## 2021-11-12 ENCOUNTER — Other Ambulatory Visit: Payer: Self-pay

## 2021-11-12 DIAGNOSIS — M25511 Pain in right shoulder: Secondary | ICD-10-CM | POA: Diagnosis not present

## 2021-11-12 DIAGNOSIS — G8929 Other chronic pain: Secondary | ICD-10-CM

## 2021-11-12 DIAGNOSIS — M6281 Muscle weakness (generalized): Secondary | ICD-10-CM

## 2021-11-12 DIAGNOSIS — M79604 Pain in right leg: Secondary | ICD-10-CM | POA: Diagnosis not present

## 2021-11-12 DIAGNOSIS — M79605 Pain in left leg: Secondary | ICD-10-CM | POA: Diagnosis not present

## 2021-11-12 DIAGNOSIS — R296 Repeated falls: Secondary | ICD-10-CM | POA: Diagnosis not present

## 2021-11-12 NOTE — Therapy (Signed)
?OUTPATIENT PHYSICAL THERAPY TREATMENT NOTE ? ? ?Patient Name: Luis Carter ?MRN: [id number] ?DOB:1946/03/22, 76 y.o., male ?Today's Date: 11/12/2021 ? ?Progress Note ?Reporting Period 10/01/21 to 11/07/21 ? ?See note below for Objective Data and Assessment of Progress/Goals.  ? ?  ? ?PCP: Nolene Ebbs, MD ?REFERRING PROVIDER: Nolene Ebbs, MD ? ? PT End of Session - 11/12/21 0947   ? ? Visit Number 11   ? Number of Visits 16   ? Date for PT Re-Evaluation 11/29/21   ? Authorization Type [facility] MCR/MCD   ? PT Start Time [phone]   ? PT Stop Time 1018   ? PT Time Calculation (min) 41 min   ? Activity Tolerance Patient tolerated treatment well   ? Behavior During Therapy [facility] for tasks assessed/performed   ? ?  ?  ? ?  ? ? ? ? ? ? ? ? ?Past Medical History:  ?Diagnosis Date  ? Arthritis   ? "left arm/shoulder; both legs" (12/12/2015)  ? ASTHMA   ? since childhood  ? Cardiomyopathy ([place]) 01/25/2016  ? Echo 12/13/15 - Mild LVH, EF 45-50%, normal wall motion, grade 1 diastolic dysfunction, mild LAE, mild RVE, mild RAE  ? COPD 04/14/2007  ? Coronary artery disease   ? DIABETES MELLITUS, TYPE II 04/14/2007  ? GERD (gastroesophageal reflux disease)   ? GI bleed   ? Gout   ? High cholesterol   ? History of nuclear stress test   ? a.  Myoview 6/17: EF 54%, no ischemia  ? HYPERTENSION 04/14/2007  ? LEG PAIN, RIGHT 05/19/2008  ? LOW BACK PAIN 04/14/2007  ? Myocardial infarction [facility])   ? ~ 2001/notes 09/13/2001 (03/22/2013)  ? PANCREATITIS, HX OF 04/14/2007  ? Pneumonia   ? "twice" (12/12/2015)  ? SEIZURE DISORDER 04/14/2007  ? "used to have them when I was young" (03/22/2013)  ? Shortness of breath   ? "related to asthma" (03/22/2013)  ? ?Past Surgical History:  ?Procedure Laterality Date  ? CORONARY ANGIOPLASTY WITH STENT PLACEMENT    ? ESOPHAGOGASTRODUODENOSCOPY  12/21/2011  ? Procedure: ESOPHAGOGASTRODUODENOSCOPY (EGD);  Surgeon: Inda Castle, MD;  Location: [place];  Service: Endoscopy;  Laterality: N/A;  ? ?Patient Active Problem List   ? Diagnosis Date Noted  ? Physical debility 09/17/2021  ? Acute right hip pain 09/16/2021  ? HLD (hyperlipidemia) 09/15/2021  ? COVID-19 virus infection 09/15/2021  ? Acute exacerbation of chronic obstructive pulmonary disease (COPD) ([place]) 03/11/2021  ? SIRS (systemic inflammatory response syndrome) ([place]) 03/11/2021  ? Malnutrition of moderate degree 02/11/2021  ? Hyperkalemia 02/10/2021  ? Dyspnea 02/08/2021  ? Community acquired pneumonia of right upper lobe of lung   ? Respiratory failure with hypoxia and hypercapnia ([place]) 10/11/2018  ? Acute pain of left shoulder 10/15/2016  ? Pain in right leg 10/15/2016  ? Weakness of right leg 10/15/2016  ? Cardiomyopathy ([place]) 01/25/2016  ? 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 Abnormal echocardiogram 12/13/2015  ? Syncope 12/12/2015  ? Tobacco use disorder 09/07/2015  ? Gout 01/27/2012  ? Cocaine use 12/22/2011  ? Acute chest pain 12/18/2008  ? Diabetes mellitus with renal complications ([place]) 123XX123  ? Hypertension 04/14/2007  ? Asthma 04/14/2007  ? COPD (chronic obstructive pulmonary disease) ([place]) 04/14/2007  ? 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 Seizures ([place]) 04/14/2007  ? PANCREATITIS, HX OF 04/14/2007  ? ? ?REFERRING DIAG:  ?R53.81 (ICD-10-CM) - Physical debility  ?M25.551 (ICD-10-CM) - Acute right hip pain  ?  R29.898 (ICD-10-CM) - Weakness of right leg  ?M79.604 (ICD-10-CM) - Pain in right leg  ?M25.512 (ICD-10-CM) - Acute pain of left shoulder  ? ? ?THERAPY DIAG:  ?Pain in left leg ? ?Pain in right leg ? ?Muscle weakness (generalized) ? ?Chronic right shoulder pain ? ?PERTINENT HISTORY: HTN, HLD, CKD 3B, DM2, gout, COPD, prior cocaine use. ?Back pain since2008, leg pain Right since 2009 MI, pacreatitis. Chronic hx of lumbar stenosis. See extensive medical history ? ?PRECAUTIONS: Fall ? ?SUBJECTIVE: P reports he is doing well. He is having a little L kee pain. ? ?PAIN:  ?Are you having pain? Yes ?NPRS scale: 0/10 in back; L  knee 3/10; R knee 0/10, 0/10 right shoulder ?Pain location:  right, left knee  ?Pain orientation: anterior ?PAIN TYPE: aching, sharp, and tight ?Pain description: intermittent  ?Aggravating factors: sleep  ?Relieving factors: resting. ? ? ? ? ?OBJECTIVE:  ?  ?DIAGNOSTIC FINDINGS:  ?09-18-21 ?No signs of fracture or dislocation. Mild degenerative changes about ?the LEFT and RIGHT hip. Degenerative changes in the lumbar spine. ?Soft tissues are grossly normal. ?  ?IMPRESSION: ?Mild degenerative changes without acute osseous abnormality. ?  ?PATIENT SURVEYS:  ?FOTO back 51%  predicted 54%   balance 44% predicted 51%  ?  ?SCREENING FOR RED FLAGS: ?Bowel or bladder incontinence: No ?  ?  ?COGNITION: ?         Overall cognitive status: Within functional limits for tasks assessed              ?          ?SENSATION: ?         Light touch: Appears intact ?         Stereognosis: Appears intact ?         Hot/Cold: Appears intact ?         Proprioception: Appears intact ?  ?MUSCLE LENGTH: ?Hamstrings: Right 50 deg; Left 54 deg ?Thomas test: bil hip flexor tightness ?  ?POSTURE:  ?Pt with flexed posture, rounded shoulders and forward neck ?  ?PALPATION: ?Pt with bil lumbar tightness, TTP bil hip post to greater troch, No TTP for R shoulder but limited range, Pt with complaint of bil knees but minimal tenderness to palpation on knees globally ?  ?LUMBARAROM/PROM ?  ?A/PROM A/PROM  ?10/01/2021  ?Flexion 50% fingertips to mid shin  ?Extension  10% pain with ext  ?Right lateral flexion 50%  ?Left lateral flexion 50%   ?Right rotation 75%  ?Left rotation 50%  ? (Blank rows = not tested) ?  ?LE AROM/PROM: ?                              All WNL except noted ?A/PROM Right ?10/01/2021 Left ?10/01/2021  ?Hip flexion      ?Hip extension      ?Hip abduction      ?Hip adduction      ?Hip internal rotation 25 35  ?Hip external rotation 35 49  ?Knee flexion      ?Knee extension      ?Ankle dorsiflexion      ?Ankle plantarflexion      ?Ankle inversion       ?Ankle eversion      ? (Blank rows = not tested) ?  ?LE MMT: ?  ?MMT Right ?10/01/2021 Left ?10/01/2021 Right ?10/17/21 Left ?10/17/21 Right ?11/05/21 Left  ?11/05/21  ?Hip flexion 4-/5 4-/5 4+/5 4+/5    ?  Hip extension 4-/5 4-/5      ?Hip abduction 3+/5 3+/5      ?Hip adduction          ?Hip internal rotation          ?Hip external rotation          ?Knee flexion 4-/5 4/5   4+/5 4+/5  ?Knee extension 4-/5 4/5   4+/5 4+/5  ?Ankle dorsiflexion 4-/5 4-/5 3+/5 4/5    ?Ankle plantarflexion          ?Ankle inversion          ?Ankle eversion          ? (Blank rows = not tested) ?  ?UPPER EXTREMITY AROM/PROM: ?  ?A/PROM Right ?10/01/2021 Left ?10/01/2021 Right Left  ?Shoulder flexion 135 143 145   ?Shoulder extension        ?Shoulder abduction 130 140 145   ?Shoulder adduction        ?Shoulder internal rotation     Mid lumbar reach   ?Shoulder external rotation     T2 reach   ?Elbow flexion        ?Elbow extension        ?Wrist flexion        ?Wrist extension        ?Wrist ulnar deviation        ?Wrist radial deviation        ?Wrist pronation        ?Wrist supination        ?(Blank rows = not tested) ?  ?UPPER EXTREMITY MMT: ?Grossly 4 to 4+/5 throughout ?MMT Right ?10/01/2021 Left ?10/01/2021 Right ?10/17/21  ?Shoulder flexion     4+/5  ?Shoulder extension       ?Shoulder abduction     4-/5  ?Shoulder adduction       ?Shoulder ER   4+/5  ?Shoulder IR   5/5  ?Middle trapezius       ?Lower trapezius       ?Elbow flexion       ?Elbow extension       ?Wrist flexion       ?Wrist extension       ?Wrist ulnar deviation       ?Wrist radial deviation       ?Wrist pronation       ?Wrist supination       ?Grip strength (lbs)       ?(Blank rows = not tested) ?  ?  ?LUMBAR SPECIAL TESTS:  ?Straight leg raise test: Negative and Slump test: Negative ?  ?FUNCTIONAL TESTS:  ?30 sec STS  6 x must use hands ?11/05/21: 5 x STS 26.9 sec from mat without UE ?  ?GAIT: ?Distance walked: 130 ?Assistive device utilized: Single point cane ?Level of assistance:  SBA ?Comments: 2.01 ft/sec ?  ?BERG BALANCE TEST ?Sitting to Standing: 3.      Stands independently using hands ?Standing Unsupported: 3.      Stands 2 minutes with supervision ?Sitting Unsupported: 4.

## 2021-11-14 ENCOUNTER — Ambulatory Visit: Payer: Medicare Other

## 2021-11-14 ENCOUNTER — Other Ambulatory Visit: Payer: Self-pay

## 2021-11-14 DIAGNOSIS — M6281 Muscle weakness (generalized): Secondary | ICD-10-CM

## 2021-11-14 DIAGNOSIS — M79604 Pain in right leg: Secondary | ICD-10-CM

## 2021-11-14 DIAGNOSIS — M25511 Pain in right shoulder: Secondary | ICD-10-CM | POA: Diagnosis not present

## 2021-11-14 DIAGNOSIS — R296 Repeated falls: Secondary | ICD-10-CM

## 2021-11-14 DIAGNOSIS — M79605 Pain in left leg: Secondary | ICD-10-CM

## 2021-11-14 DIAGNOSIS — G8929 Other chronic pain: Secondary | ICD-10-CM | POA: Diagnosis not present

## 2021-11-14 NOTE — Therapy (Signed)
?OUTPATIENT PHYSICAL THERAPY TREATMENT NOTE ? ? ?Patient Name: Luis Carter ?MRN: [id number] ?DOB:August 18, 1946, 76 y.o., male ?Today's Date: 11/14/2021 ? ?Progress Note ?Reporting Period 10/01/21 to 11/07/21 ? ?See note below for Objective Data and Assessment of Progress/Goals.  ? ?  ? ?PCP: Nolene Ebbs, MD ?REFERRING PROVIDER: Jonetta Osgood, MD ? ? PT End of Session - 11/14/21 0933   ? ? Visit Number 12   ? Number of Visits 16   ? Date for PT Re-Evaluation 11/29/21   ? Authorization Type [facility] MCR/MCD   ? PT Start Time [phone]   ? PT Stop Time 1014   ? PT Time Calculation (min) 41 min   ? Activity Tolerance Patient tolerated treatment well   ? Behavior During Therapy [facility] for tasks assessed/performed   ? ?  ?  ? ?  ? ? ? ? ? ? ? ? ? ?Past Medical History:  ?Diagnosis Date  ? Arthritis   ? "left arm/shoulder; both legs" (12/12/2015)  ? ASTHMA   ? since childhood  ? Cardiomyopathy ([place]) 01/25/2016  ? Echo 12/13/15 - Mild LVH, EF 45-50%, normal wall motion, grade 1 diastolic dysfunction, mild LAE, mild RVE, mild RAE  ? COPD 04/14/2007  ? Coronary artery disease   ? DIABETES MELLITUS, TYPE II 04/14/2007  ? GERD (gastroesophageal reflux disease)   ? GI bleed   ? Gout   ? High cholesterol   ? History of nuclear stress test   ? a.  Myoview 6/17: EF 54%, no ischemia  ? HYPERTENSION 04/14/2007  ? LEG PAIN, RIGHT 05/19/2008  ? LOW BACK PAIN 04/14/2007  ? Myocardial infarction [facility])   ? ~ 2001/notes 09/13/2001 (03/22/2013)  ? PANCREATITIS, HX OF 04/14/2007  ? Pneumonia   ? "twice" (12/12/2015)  ? SEIZURE DISORDER 04/14/2007  ? "used to have them when I was young" (03/22/2013)  ? Shortness of breath   ? "related to asthma" (03/22/2013)  ? ?Past Surgical History:  ?Procedure Laterality Date  ? CORONARY ANGIOPLASTY WITH STENT PLACEMENT    ? ESOPHAGOGASTRODUODENOSCOPY  12/21/2011  ? Procedure: ESOPHAGOGASTRODUODENOSCOPY (EGD);  Surgeon: Inda Castle, MD;  Location: [place];  Service: Endoscopy;  Laterality: N/A;  ? ?Patient Active Problem  List  ? Diagnosis Date Noted  ? Physical debility 09/17/2021  ? Acute right hip pain 09/16/2021  ? HLD (hyperlipidemia) 09/15/2021  ? COVID-19 virus infection 09/15/2021  ? Acute exacerbation of chronic obstructive pulmonary disease (COPD) ([place]) 03/11/2021  ? SIRS (systemic inflammatory response syndrome) ([place]) 03/11/2021  ? Malnutrition of moderate degree 02/11/2021  ? Hyperkalemia 02/10/2021  ? Dyspnea 02/08/2021  ? Community acquired pneumonia of right upper lobe of lung   ? Respiratory failure with hypoxia and hypercapnia ([place]) 10/11/2018  ? Acute pain of left shoulder 10/15/2016  ? Pain in right leg 10/15/2016  ? Weakness of right leg 10/15/2016  ? Cardiomyopathy ([place]) 01/25/2016  ? 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 Abnormal echocardiogram 12/13/2015  ? Syncope 12/12/2015  ? Tobacco use disorder 09/07/2015  ? Gout 01/27/2012  ? Cocaine use 12/22/2011  ? Acute chest pain 12/18/2008  ? 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 Hypertension 04/14/2007  ? Asthma 04/14/2007  ? COPD (chronic obstructive pulmonary disease) ([place]) 04/14/2007  ? 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 Seizures ([place]) 04/14/2007  ? PANCREATITIS, HX OF 04/14/2007  ? ? ?REFERRING DIAG:  ?R53.81 (ICD-10-CM) - Physical debility  ?M25.551 (ICD-10-CM) - Acute right  hip pain  ?R29.898 (ICD-10-CM) - Weakness of right leg  ?M79.604 (ICD-10-CM) - Pain in right leg  ?M25.512 (ICD-10-CM) - Acute pain of left shoulder  ? ? ?THERAPY DIAG:  ?Pain in left leg ? ?Pain in right leg ? ?Muscle weakness (generalized) ? ?Repeated falls ? ?PERTINENT HISTORY: HTN, HLD, CKD 3B, DM2, gout, COPD, prior cocaine use. ?Back pain since2008, leg pain Right since 2009 MI, pacreatitis. Chronic hx of lumbar stenosis. See extensive medical history ? ?PRECAUTIONS: Fall ? ?SUBJECTIVE: Pt reports his L low back is bothering him today. He notes he forgot to take his pain medication. ? ?PAIN:  ?Are you having pain?  Yes ?NPRS scale: 4/10 in L back; L knee 0/10; R knee 0/10, 0/10 right shoulder ?Pain location:  right, left knee  ?Pain orientation: anterior ?PAIN TYPE: aching, sharp, and tight ?Pain description: intermittent  ?Aggravating factors: sleep  ?Relieving factors: resting. ? ?OBJECTIVE:  ?  ?DIAGNOSTIC FINDINGS:  ?09-18-21 ?No signs of fracture or dislocation. Mild degenerative changes about ?the LEFT and RIGHT hip. Degenerative changes in the lumbar spine. ?Soft tissues are grossly normal. ?  ?IMPRESSION: ?Mild degenerative changes without acute osseous abnormality. ?  ?PATIENT SURVEYS:  ?FOTO back 51%  predicted 54%   balance 44% predicted 51%  ?  ?SCREENING FOR RED FLAGS: ?Bowel or bladder incontinence: No ?  ?  ?COGNITION: ?         Overall cognitive status: Within functional limits for tasks assessed              ?          ?SENSATION: ?         Light touch: Appears intact ?         Stereognosis: Appears intact ?         Hot/Cold: Appears intact ?         Proprioception: Appears intact ?  ?MUSCLE LENGTH: ?Hamstrings: Right 50 deg; Left 54 deg ?Thomas test: bil hip flexor tightness ?  ?POSTURE:  ?Pt with flexed posture, rounded shoulders and forward neck ?  ?PALPATION: ?Pt with bil lumbar tightness, TTP bil hip post to greater troch, No TTP for R shoulder but limited range, Pt with complaint of bil knees but minimal tenderness to palpation on knees globally ?  ?LUMBARAROM/PROM ?  ?A/PROM A/PROM  ?10/01/2021  ?Flexion 50% fingertips to mid shin  ?Extension  10% pain with ext  ?Right lateral flexion 50%  ?Left lateral flexion 50%   ?Right rotation 75%  ?Left rotation 50%  ? (Blank rows = not tested) ?  ?LE AROM/PROM: ?                              All WNL except noted ?A/PROM Right ?10/01/2021 Left ?10/01/2021  ?Hip flexion      ?Hip extension      ?Hip abduction      ?Hip adduction      ?Hip internal rotation 25 35  ?Hip external rotation 35 49  ?Knee flexion      ?Knee extension      ?Ankle dorsiflexion      ?Ankle  plantarflexion      ?Ankle inversion      ?Ankle eversion      ? (Blank rows = not tested) ?  ?LE MMT: ?  ?MMT Right ?10/01/2021 Left ?10/01/2021 Right ?10/17/21 Left ?10/17/21 Right ?11/05/21 Left  ?11/05/21  ?Hip flexion 4-/5 4-/5  4+/5 4+/5    ?Hip extension 4-/5 4-/5      ?Hip abduction 3+/5 3+/5      ?Hip adduction          ?Hip internal rotation          ?Hip external rotation          ?Knee flexion 4-/5 4/5   4+/5 4+/5  ?Knee extension 4-/5 4/5   4+/5 4+/5  ?Ankle dorsiflexion 4-/5 4-/5 3+/5 4/5    ?Ankle plantarflexion          ?Ankle inversion          ?Ankle eversion          ? (Blank rows = not tested) ?  ?UPPER EXTREMITY AROM/PROM: ?  ?A/PROM Right ?10/01/2021 Left ?10/01/2021 Right Left  ?Shoulder flexion 135 143 145   ?Shoulder extension        ?Shoulder abduction 130 140 145   ?Shoulder adduction        ?Shoulder internal rotation     Mid lumbar reach   ?Shoulder external rotation     T2 reach   ?Elbow flexion        ?Elbow extension        ?Wrist flexion        ?Wrist extension        ?Wrist ulnar deviation        ?Wrist radial deviation        ?Wrist pronation        ?Wrist supination        ?(Blank rows = not tested) ?  ?UPPER EXTREMITY MMT: ?Grossly 4 to 4+/5 throughout ?MMT Right ?10/01/2021 Left ?10/01/2021 Right ?10/17/21  ?Shoulder flexion     4+/5  ?Shoulder extension       ?Shoulder abduction     4-/5  ?Shoulder adduction       ?Shoulder ER   4+/5  ?Shoulder IR   5/5  ?Middle trapezius       ?Lower trapezius       ?Elbow flexion       ?Elbow extension       ?Wrist flexion       ?Wrist extension       ?Wrist ulnar deviation       ?Wrist radial deviation       ?Wrist pronation       ?Wrist supination       ?Grip strength (lbs)       ?(Blank rows = not tested) ?  ?  ?LUMBAR SPECIAL TESTS:  ?Straight leg raise test: Negative and Slump test: Negative ?  ?FUNCTIONAL TESTS:  ?30 sec STS  6 x must use hands ?11/05/21: 5 x STS 26.9 sec from mat without UE ?  ?GAIT: ?Distance walked: 130 ?Assistive device utilized:  Single point cane ?Level of assistance: SBA ?Comments: 2.01 ft/sec ?  ?BERG BALANCE TEST ?Sitting to Standing: 3.      Stands independently using hands ?Standing Unsupported: 3.      Stands 2 minutes with supervision ?

## 2021-11-19 ENCOUNTER — Ambulatory Visit: Payer: Medicare Other

## 2021-11-19 ENCOUNTER — Other Ambulatory Visit: Payer: Self-pay

## 2021-11-19 DIAGNOSIS — M25511 Pain in right shoulder: Secondary | ICD-10-CM | POA: Diagnosis not present

## 2021-11-19 DIAGNOSIS — M6281 Muscle weakness (generalized): Secondary | ICD-10-CM | POA: Diagnosis not present

## 2021-11-19 DIAGNOSIS — M79604 Pain in right leg: Secondary | ICD-10-CM

## 2021-11-19 DIAGNOSIS — R296 Repeated falls: Secondary | ICD-10-CM

## 2021-11-19 DIAGNOSIS — M79605 Pain in left leg: Secondary | ICD-10-CM

## 2021-11-19 DIAGNOSIS — G8929 Other chronic pain: Secondary | ICD-10-CM | POA: Diagnosis not present

## 2021-11-19 NOTE — Therapy (Signed)
?OUTPATIENT PHYSICAL THERAPY TREATMENT NOTE ? ? ?Patient Name: Luis Carter ?MRN: [id number] ?DOB:1946-07-09, 76 y.o., male ?Today's Date: 11/19/2021 ? ?Progress Note ?Reporting Period 10/01/21 to 11/07/21 ? ?See note below for Objective Data and Assessment of Progress/Goals.  ? ?  ? ?PCP: Nolene Ebbs, MD ?REFERRING PROVIDER: Nolene Ebbs, MD ? ? PT End of Session - 11/19/21 1650   ? ? Visit Number 13   ? Number of Visits 16   ? Date for PT Re-Evaluation 11/29/21   ? Authorization Type [facility] MCR/MCD   ? PT Start Time [phone]   ? PT Stop Time [id number]   ? PT Time Calculation (min) 42 min   ? Activity Tolerance Patient tolerated treatment well   ? Behavior During Therapy [facility] for tasks assessed/performed   ? ?  ?  ? ?  ? ? ? ? ? ? ? ? ? ? ?Past Medical History:  ?Diagnosis Date  ? Arthritis   ? "left arm/shoulder; both legs" (12/12/2015)  ? ASTHMA   ? since childhood  ? Cardiomyopathy ([place]) 01/25/2016  ? Echo 12/13/15 - Mild LVH, EF 45-50%, normal wall motion, grade 1 diastolic dysfunction, mild LAE, mild RVE, mild RAE  ? COPD 04/14/2007  ? Coronary artery disease   ? DIABETES MELLITUS, TYPE II 04/14/2007  ? GERD (gastroesophageal reflux disease)   ? GI bleed   ? Gout   ? High cholesterol   ? History of nuclear stress test   ? a.  Myoview 6/17: EF 54%, no ischemia  ? HYPERTENSION 04/14/2007  ? LEG PAIN, RIGHT 05/19/2008  ? LOW BACK PAIN 04/14/2007  ? Myocardial infarction [facility])   ? ~ 2001/notes 09/13/2001 (03/22/2013)  ? PANCREATITIS, HX OF 04/14/2007  ? Pneumonia   ? "twice" (12/12/2015)  ? SEIZURE DISORDER 04/14/2007  ? "used to have them when I was young" (03/22/2013)  ? Shortness of breath   ? "related to asthma" (03/22/2013)  ? ?Past Surgical History:  ?Procedure Laterality Date  ? CORONARY ANGIOPLASTY WITH STENT PLACEMENT    ? ESOPHAGOGASTRODUODENOSCOPY  12/21/2011  ? Procedure: ESOPHAGOGASTRODUODENOSCOPY (EGD);  Surgeon: Inda Castle, MD;  Location: [place];  Service: Endoscopy;  Laterality: N/A;  ? ?Patient Active Problem  List  ? Diagnosis Date Noted  ? Physical debility 09/17/2021  ? Acute right hip pain 09/16/2021  ? HLD (hyperlipidemia) 09/15/2021  ? COVID-19 virus infection 09/15/2021  ? Acute exacerbation of chronic obstructive pulmonary disease (COPD) ([place]) 03/11/2021  ? SIRS (systemic inflammatory response syndrome) ([place]) 03/11/2021  ? Malnutrition of moderate degree 02/11/2021  ? Hyperkalemia 02/10/2021  ? Dyspnea 02/08/2021  ? Community acquired pneumonia of right upper lobe of lung   ? Respiratory failure with hypoxia and hypercapnia ([place]) 10/11/2018  ? Acute pain of left shoulder 10/15/2016  ? Pain in right leg 10/15/2016  ? Weakness of right leg 10/15/2016  ? Cardiomyopathy ([place]) 01/25/2016  ? 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 Abnormal echocardiogram 12/13/2015  ? Syncope 12/12/2015  ? Tobacco use disorder 09/07/2015  ? Gout 01/27/2012  ? Cocaine use 12/22/2011  ? Acute chest pain 12/18/2008  ? Diabetes mellitus with renal complications ([place]) 123XX123  ? Hypertension 04/14/2007  ? Asthma 04/14/2007  ? COPD (chronic obstructive pulmonary disease) ([place]) 04/14/2007  ? 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 Seizures ([place]) 04/14/2007  ? PANCREATITIS, HX OF 04/14/2007  ? ? ?REFERRING DIAG:  ?R53.81 (ICD-10-CM) - Physical debility  ?M25.551 (ICD-10-CM) - Acute right  hip pain  ?R29.898 (ICD-10-CM) - Weakness of right leg  ?M79.604 (ICD-10-CM) - Pain in right leg  ?M25.512 (ICD-10-CM) - Acute pain of left shoulder  ? ? ?THERAPY DIAG:  ?Pain in left leg ? ?Pain in right leg ? ?Muscle weakness (generalized) ? ?Repeated falls ? ?PERTINENT HISTORY: HTN, HLD, CKD 3B, DM2, gout, COPD, prior cocaine use. ?Back pain since2008, leg pain Right since 2009 MI, pacreatitis. Chronic hx of lumbar stenosis. See extensive medical history ? ?PRECAUTIONS: Fall ? ?SUBJECTIVE: Pt reports he is going better. ? ?PAIN:  ?Are you having pain? Yes ?NPRS scale: 0/10 in L back; L knee 2/10; R knee 2/10, 0/10 right  shoulder ?Pain location:  right, left knee  ?Pain orientation: anterior ?PAIN TYPE: aching, sharp, and tight ?Pain description: intermittent  ?Aggravating factors: sleep  ?Relieving factors: resting. ? ?OBJECTIVE:  ?  ?DIAGNOSTIC FINDINGS:  ?09-18-21 ?No signs of fracture or dislocation. Mild degenerative changes about ?the LEFT and RIGHT hip. Degenerative changes in the lumbar spine. ?Soft tissues are grossly normal. ?  ?IMPRESSION: ?Mild degenerative changes without acute osseous abnormality. ?  ?PATIENT SURVEYS:  ?FOTO back 51%  predicted 54%   balance 44% predicted 51%  ?  ?SCREENING FOR RED FLAGS: ?Bowel or bladder incontinence: No ?  ?  ?COGNITION: ?         Overall cognitive status: Within functional limits for tasks assessed              ?          ?SENSATION: ?         Light touch: Appears intact ?         Stereognosis: Appears intact ?         Hot/Cold: Appears intact ?         Proprioception: Appears intact ?  ?MUSCLE LENGTH: ?Hamstrings: Right 50 deg; Left 54 deg ?Thomas test: bil hip flexor tightness ?  ?POSTURE:  ?Pt with flexed posture, rounded shoulders and forward neck ?  ?PALPATION: ?Pt with bil lumbar tightness, TTP bil hip post to greater troch, No TTP for R shoulder but limited range, Pt with complaint of bil knees but minimal tenderness to palpation on knees globally ?  ?LUMBARAROM/PROM ?  ?A/PROM A/PROM  ?10/01/2021  ?Flexion 50% fingertips to mid shin  ?Extension  10% pain with ext  ?Right lateral flexion 50%  ?Left lateral flexion 50%   ?Right rotation 75%  ?Left rotation 50%  ? (Blank rows = not tested) ?  ?LE AROM/PROM: ?                              All WNL except noted ?A/PROM Right ?10/01/2021 Left ?10/01/2021  ?Hip flexion      ?Hip extension      ?Hip abduction      ?Hip adduction      ?Hip internal rotation 25 35  ?Hip external rotation 35 49  ?Knee flexion      ?Knee extension      ?Ankle dorsiflexion      ?Ankle plantarflexion      ?Ankle inversion      ?Ankle eversion      ? (Blank rows  = not tested) ?  ?LE MMT: ?  ?MMT Right ?10/01/2021 Left ?10/01/2021 Right ?10/17/21 Left ?10/17/21 Right ?11/05/21 Left  ?11/05/21  ?Hip flexion 4-/5 4-/5 4+/5 4+/5    ?Hip extension 4-/5 4-/5      ?  Hip abduction 3+/5 3+/5      ?Hip adduction          ?Hip internal rotation          ?Hip external rotation          ?Knee flexion 4-/5 4/5   4+/5 4+/5  ?Knee extension 4-/5 4/5   4+/5 4+/5  ?Ankle dorsiflexion 4-/5 4-/5 3+/5 4/5    ?Ankle plantarflexion          ?Ankle inversion          ?Ankle eversion          ? (Blank rows = not tested) ?  ?UPPER EXTREMITY AROM/PROM: ?  ?A/PROM Right ?10/01/2021 Left ?10/01/2021 Right Left  ?Shoulder flexion 135 143 145   ?Shoulder extension        ?Shoulder abduction 130 140 145   ?Shoulder adduction        ?Shoulder internal rotation     Mid lumbar reach   ?Shoulder external rotation     T2 reach   ?Elbow flexion        ?Elbow extension        ?Wrist flexion        ?Wrist extension        ?Wrist ulnar deviation        ?Wrist radial deviation        ?Wrist pronation        ?Wrist supination        ?(Blank rows = not tested) ?  ?UPPER EXTREMITY MMT: ?Grossly 4 to 4+/5 throughout ?MMT Right ?10/01/2021 Left ?10/01/2021 Right ?10/17/21  ?Shoulder flexion     4+/5  ?Shoulder extension       ?Shoulder abduction     4-/5  ?Shoulder adduction       ?Shoulder ER   4+/5  ?Shoulder IR   5/5  ?Middle trapezius       ?Lower trapezius       ?Elbow flexion       ?Elbow extension       ?Wrist flexion       ?Wrist extension       ?Wrist ulnar deviation       ?Wrist radial deviation       ?Wrist pronation       ?Wrist supination       ?Grip strength (lbs)       ?(Blank rows = not tested) ?  ?  ?LUMBAR SPECIAL TESTS:  ?Straight leg raise test: Negative and Slump test: Negative ?  ?FUNCTIONAL TESTS:  ?30 sec STS  6 x must use hands ?11/05/21: 5 x STS 26.9 sec from mat without UE ?  ?GAIT: ?Distance walked: 130 ?Assistive device utilized: Single point cane ?Level of assistance: SBA ?Comments: 2.01 ft/sec ?  ?BERG  BALANCE TEST ?Sitting to Standing: 3.      Stands independently using hands ?Standing Unsupported: 3.      Stands 2 minutes with supervision ?Sitting Unsupported: 4.     Sits for 2 minutes independently ?Standin

## 2021-11-20 NOTE — Therapy (Signed)
?OUTPATIENT PHYSICAL THERAPY TREATMENT NOTE ? ? ?Patient Name: Luis Carter ?MRN: [id number] ?DOB:[birth year], 76 y.o., male ?Today's Date: 11/21/2021 ? ?Progress Note ?Reporting Period 10/01/21 to 11/07/21 ? ?See note below for Objective Data and Assessment of Progress/Goals.  ? ?  ? ?PCP: Nolene Ebbs, MD ?REFERRING PROVIDER: Jonetta Osgood, MD ? ? PT End of Session - 11/21/21 1030   ? ? Visit Number 14   ? Number of Visits 16   ? Authorization Type [facility] MCR/MCD   ? PT Start Time 1030   ? PT Stop Time 1100   ? PT Time Calculation (min) 30 min   ? Activity Tolerance Patient tolerated treatment well   ? Behavior During Therapy [facility] for tasks assessed/performed   ? ?  ?  ? ?  ? ? ? ? ? ? ? ? ? ? ? ?Past Medical History:  ?Diagnosis Date  ? Arthritis   ? "left arm/shoulder; both legs" (12/12/2015)  ? ASTHMA   ? since childhood  ? Cardiomyopathy ([place]) 01/25/2016  ? Echo 12/13/15 - Mild LVH, EF 45-50%, normal wall motion, grade 1 diastolic dysfunction, mild LAE, mild RVE, mild RAE  ? COPD 04/14/2007  ? Coronary artery disease   ? DIABETES MELLITUS, TYPE II 04/14/2007  ? GERD (gastroesophageal reflux disease)   ? GI bleed   ? Gout   ? High cholesterol   ? History of nuclear stress test   ? a.  Myoview 6/17: EF 54%, no ischemia  ? HYPERTENSION 04/14/2007  ? LEG PAIN, RIGHT 05/19/2008  ? LOW BACK PAIN 04/14/2007  ? Myocardial infarction [facility])   ? ~ 2001/notes 09/13/2001 (03/22/2013)  ? PANCREATITIS, HX OF 04/14/2007  ? Pneumonia   ? "twice" (12/12/2015)  ? SEIZURE DISORDER 04/14/2007  ? "used to have them when I was young" (03/22/2013)  ? Shortness of breath   ? "related to asthma" (03/22/2013)  ? ?Past Surgical History:  ?Procedure Laterality Date  ? CORONARY ANGIOPLASTY WITH STENT PLACEMENT    ? ESOPHAGOGASTRODUODENOSCOPY  12/21/2011  ? Procedure: ESOPHAGOGASTRODUODENOSCOPY (EGD);  Surgeon: Inda Castle, MD;  Location: [place];  Service: Endoscopy;  Laterality: N/A;  ? ?Patient Active Problem List  ? Diagnosis Date Noted  ?  Physical debility 09/17/2021  ? Acute right hip pain 09/16/2021  ? HLD (hyperlipidemia) 09/15/2021  ? COVID-19 virus infection 09/15/2021  ? Acute exacerbation of chronic obstructive pulmonary disease (COPD) ([place]) 03/11/2021  ? SIRS (systemic inflammatory response syndrome) ([place]) 03/11/2021  ? Malnutrition of moderate degree 02/11/2021  ? Hyperkalemia 02/10/2021  ? Dyspnea 02/08/2021  ? Community acquired pneumonia of right upper lobe of lung   ? Respiratory failure with hypoxia and hypercapnia ([place]) 10/11/2018  ? Acute pain of left shoulder 10/15/2016  ? Pain in right leg 10/15/2016  ? Weakness of right leg 10/15/2016  ? Cardiomyopathy ([place]) 01/25/2016  ? 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 Abnormal echocardiogram 12/13/2015  ? Syncope 12/12/2015  ? Tobacco use disorder 09/07/2015  ? Gout 01/27/2012  ? Cocaine use 12/22/2011  ? Acute chest pain 12/18/2008  ? Diabetes mellitus with renal complications ([place]) 123XX123  ? Hypertension 04/14/2007  ? Asthma 04/14/2007  ? COPD (chronic obstructive pulmonary disease) ([place]) 04/14/2007  ? 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 Seizures ([place]) 04/14/2007  ? PANCREATITIS, HX OF 04/14/2007  ? ? ?REFERRING DIAG:  ?R53.81 (ICD-10-CM) - Physical debility  ?M25.551 (ICD-10-CM) - Acute right hip pain  ?[id number] (ICD-10-CM) -  Weakness of right leg  ?M79.604 (ICD-10-CM) - Pain in right leg  ?M25.512 (ICD-10-CM) - Acute pain of left shoulder  ? ? ?THERAPY DIAG:  ?Pain in left leg ? ?Pain in right leg ? ?Muscle weakness (generalized) ? ?Repeated falls ? ?PERTINENT HISTORY: HTN, HLD, CKD 3B, DM2, gout, COPD, prior cocaine use. ?Back pain since2008, leg pain Right since 2009 MI, pacreatitis. Chronic hx of lumbar stenosis. See extensive medical history ? ?PRECAUTIONS: Fall ? ?SUBJECTIVE: Pt reports therapy has helped hom to walk and move his arms better, and he has less pain. He notes today he is not having pain. ? ?PAIN:  ?Are you having pain? Yes ?NPRS  scale: 0/10 in L back; L knee 0/10; R knee 0/10, 0/10 right shoulder ?Pain location:  right, left knee  ?Pain orientation: anterior ?PAIN TYPE: aching, sharp, and tight ?Pain description: intermittent  ?Aggravating factors: sleep  ?Relieving factors: resting. ? ?OBJECTIVE:  ?  ?DIAGNOSTIC FINDINGS:  ?09-18-21 ?No signs of fracture or dislocation. Mild degenerative changes about ?the LEFT and RIGHT hip. Degenerative changes in the lumbar spine. ?Soft tissues are grossly normal. ?  ?IMPRESSION: ?Mild degenerative changes without acute osseous abnormality. ?  ?PATIENT SURVEYS:  ?FOTO back 51%  predicted 54%   balance 44% predicted 51%  ?  ?SCREENING FOR RED FLAGS: ?Bowel or bladder incontinence: No ?  ?  ?COGNITION: ?         Overall cognitive status: Within functional limits for tasks assessed              ?          ?SENSATION: ?         Light touch: Appears intact ?         Stereognosis: Appears intact ?         Hot/Cold: Appears intact ?         Proprioception: Appears intact ?  ?MUSCLE LENGTH: ?Hamstrings: Right 50 deg; Left 54 deg ?Thomas test: bil hip flexor tightness ?  ?POSTURE:  ?Pt with flexed posture, rounded shoulders and forward neck ?  ?PALPATION: ?Pt with bil lumbar tightness, TTP bil hip post to greater troch, No TTP for R shoulder but limited range, Pt with complaint of bil knees but minimal tenderness to palpation on knees globally ?  ?LUMBARAROM/PROM ?  ?A/PROM A/PROM  ?10/01/2021  ?Flexion 50% fingertips to mid shin  ?Extension  10% pain with ext  ?Right lateral flexion 50%  ?Left lateral flexion 50%   ?Right rotation 75%  ?Left rotation 50%  ? (Blank rows = not tested) ?  ?LE AROM/PROM: ?                              All WNL except noted ?A/PROM Right ?10/01/2021 Left ?10/01/2021  ?Hip flexion      ?Hip extension      ?Hip abduction      ?Hip adduction      ?Hip internal rotation 25 35  ?Hip external rotation 35 49  ?Knee flexion      ?Knee extension      ?Ankle dorsiflexion      ?Ankle plantarflexion       ?Ankle inversion      ?Ankle eversion      ? (Blank rows = not tested) ?  ?LE MMT: ?  ?MMT Right ?10/01/2021 Left ?10/01/2021 Right ?10/17/21 Left ?10/17/21 Right ?11/05/21 Left  ?11/05/21  ?Hip flexion 4-/5  4-/5 4+/5 4+/5    ?Hip extension 4-/5 4-/5      ?Hip abduction 3+/5 3+/5      ?Hip adduction          ?Hip internal rotation          ?Hip external rotation          ?Knee flexion 4-/5 4/5   4+/5 4+/5  ?Knee extension 4-/5 4/5   4+/5 4+/5  ?Ankle dorsiflexion 4-/5 4-/5 3+/5 4/5    ?Ankle plantarflexion          ?Ankle inversion          ?Ankle eversion          ? (Blank rows = not tested) ?  ?UPPER EXTREMITY AROM/PROM: ?  ?A/PROM Right ?10/01/2021 Left ?10/01/2021 Right Left  ?Shoulder flexion 135 143 145   ?Shoulder extension        ?Shoulder abduction 130 140 145   ?Shoulder adduction        ?Shoulder internal rotation     Mid lumbar reach   ?Shoulder external rotation     T2 reach   ?Elbow flexion        ?Elbow extension        ?Wrist flexion        ?Wrist extension        ?Wrist ulnar deviation        ?Wrist radial deviation        ?Wrist pronation        ?Wrist supination        ?(Blank rows = not tested) ?  ?UPPER EXTREMITY MMT: ?Grossly 4 to 4+/5 throughout ?MMT Right ?10/01/2021 Left ?10/01/2021 Right ?10/17/21  ?Shoulder flexion     4+/5  ?Shoulder extension       ?Shoulder abduction     4-/5  ?Shoulder adduction       ?Shoulder ER   4+/5  ?Shoulder IR   5/5  ?Middle trapezius       ?Lower trapezius       ?Elbow flexion       ?Elbow extension       ?Wrist flexion       ?Wrist extension       ?Wrist ulnar deviation       ?Wrist radial deviation       ?Wrist pronation       ?Wrist supination       ?Grip strength (lbs)       ?(Blank rows = not tested) ?  ?  ?LUMBAR SPECIAL TESTS:  ?Straight leg raise test: Negative and Slump test: Negative ?  ?FUNCTIONAL TESTS:  ?30 sec STS  6 x must use hands ?11/05/21: 5 x STS 26.9 sec from mat without UE ?  ?GAIT: ?Distance walked: 130 ?Assistive device utilized: Single point  cane ?Level of assistance: SBA ?Comments: 2.01 ft/sec ?  ?BERG BALANCE TEST ?Sitting to Standing: 3.      Stands independently using hands ?Standing Unsupported: 3.      Stands 2 minutes with supervision ?[place]

## 2021-11-21 ENCOUNTER — Ambulatory Visit: Payer: Medicare Other

## 2021-11-21 DIAGNOSIS — R296 Repeated falls: Secondary | ICD-10-CM | POA: Diagnosis not present

## 2021-11-21 DIAGNOSIS — M6281 Muscle weakness (generalized): Secondary | ICD-10-CM

## 2021-11-21 DIAGNOSIS — M79605 Pain in left leg: Secondary | ICD-10-CM

## 2021-11-21 DIAGNOSIS — G8929 Other chronic pain: Secondary | ICD-10-CM | POA: Diagnosis not present

## 2021-11-21 DIAGNOSIS — M79604 Pain in right leg: Secondary | ICD-10-CM | POA: Diagnosis not present

## 2021-11-21 DIAGNOSIS — M25511 Pain in right shoulder: Secondary | ICD-10-CM | POA: Diagnosis not present

## 2021-11-28 DIAGNOSIS — E1142 Type 2 diabetes mellitus with diabetic polyneuropathy: Secondary | ICD-10-CM | POA: Diagnosis not present

## 2021-11-28 DIAGNOSIS — J449 Chronic obstructive pulmonary disease, unspecified: Secondary | ICD-10-CM | POA: Diagnosis not present

## 2021-11-28 DIAGNOSIS — I10 Essential (primary) hypertension: Secondary | ICD-10-CM | POA: Diagnosis not present

## 2021-11-28 DIAGNOSIS — E7849 Other hyperlipidemia: Secondary | ICD-10-CM | POA: Diagnosis not present

## 2022-02-07 ENCOUNTER — Encounter (HOSPITAL_COMMUNITY): Payer: Self-pay | Admitting: Emergency Medicine

## 2022-02-07 ENCOUNTER — Ambulatory Visit (HOSPITAL_COMMUNITY)
Admission: EM | Admit: 2022-02-07 | Discharge: 2022-02-07 | Disposition: A | Discharge status: Home / Self Care | Payer: Medicare Other | Attending: Emergency Medicine | Admitting: Emergency Medicine

## 2022-02-07 ENCOUNTER — Ambulatory Visit (INDEPENDENT_AMBULATORY_CARE_PROVIDER_SITE_OTHER): Payer: Medicare Other

## 2022-02-07 DIAGNOSIS — N390 Urinary tract infection, site not specified: Secondary | ICD-10-CM | POA: Diagnosis not present

## 2022-02-07 DIAGNOSIS — M25562 Pain in left knee: Secondary | ICD-10-CM

## 2022-02-07 DIAGNOSIS — M1990 Unspecified osteoarthritis, unspecified site: Secondary | ICD-10-CM | POA: Diagnosis not present

## 2022-02-07 LAB — POCT URINALYSIS DIPSTICK, ED / UC
Bilirubin Urine: NEGATIVE
Glucose, UA: NEGATIVE mg/dL
Ketones, ur: NEGATIVE mg/dL
Nitrite: NEGATIVE
Protein, ur: 100 mg/dL — AB
Specific Gravity, Urine: 1.02 (ref 1.005–1.030)
Urobilinogen, UA: 1 mg/dL (ref 0.0–1.0)
pH: 5.5 (ref 5.0–8.0)

## 2022-02-07 MED ORDER — MELOXICAM 7.5 MG PO TABS: 7.5000 mg | ORAL_TABLET | Freq: Every day | ORAL | 0 refills | Status: DC

## 2022-02-07 MED ORDER — CEPHALEXIN 500 MG PO CAPS: 500.0000 mg | ORAL_CAPSULE | Freq: Four times a day (QID) | ORAL | 0 refills | Status: AC

## 2022-02-07 NOTE — ED Triage Notes (Signed)
Pt c/o left knee pain that radiates to foot with swelling for a week. Denies falls or injuries.  Pt also c/o right groin pain that radiates to his back for a couple days. Unsure if any swelling.

## 2022-02-07 NOTE — ED Provider Notes (Signed)
[place]    CSN: [id number] Arrival date & time: 02/07/22  [id number]      History   Chief Complaint Chief Complaint  Patient presents with   Leg Swelling   Leg Pain   Groin Pain    HPI Luis Carter is a 76 y.o. male.   Frequent urination he reports he has had some discomfort with urination.  Patient also complains of pain in his knee he is having some difficulty walking due to knee discomfort.  Patient reports he has had urinary tract infections in the past  The history is provided by the patient. No language interpreter was used.  Leg Pain Location:  Knee Injury: no   Knee location:  L knee Pain details:    Quality:  Aching   Radiates to:  Does not radiate   Severity:  Moderate   Onset quality:  Gradual   Timing:  Constant Chronicity:  New Dislocation: no   Relieved by:  Nothing Worsened by:  Nothing Ineffective treatments:  None tried Associated symptoms: decreased ROM   Groin Pain    Past Medical History:  Diagnosis Date   Arthritis    "left arm/shoulder; both legs" (12/12/2015)   ASTHMA    since childhood   Cardiomyopathy ([place]) 01/25/2016   Echo 12/13/15 - Mild LVH, EF 45-50%, normal wall motion, grade 1 diastolic dysfunction, mild LAE, mild RVE, mild RAE   COPD 04/14/2007   Coronary artery disease    DIABETES MELLITUS, TYPE II 04/14/2007   GERD (gastroesophageal reflux disease)    GI bleed    Gout    High cholesterol    History of nuclear stress test    a.  Myoview 6/17: EF 54%, no ischemia   HYPERTENSION 04/14/2007   LEG PAIN, RIGHT 05/19/2008   LOW BACK PAIN 04/14/2007   Myocardial infarction ([place])    ~ 2001/notes 09/13/2001 (03/22/2013)   PANCREATITIS, HX OF 04/14/2007   Pneumonia    "twice" (12/12/2015)   SEIZURE DISORDER 04/14/2007   "used to have them when I was young" (03/22/2013)   Shortness of breath    "related to asthma" (03/22/2013)    Patient Active Problem List   Diagnosis Date Noted   Physical debility 09/17/2021   Acute  right hip pain 09/16/2021   HLD (hyperlipidemia) 09/15/2021   COVID-19 virus infection 09/15/2021   Acute exacerbation of chronic obstructive pulmonary disease (COPD) ([place]) 03/11/2021   SIRS (systemic inflammatory response syndrome) ([place]) 03/11/2021   Malnutrition of moderate degree 02/11/2021   Hyperkalemia 02/10/2021   Dyspnea 02/08/2021   Community acquired pneumonia of right upper lobe of lung    Respiratory failure with hypoxia and hypercapnia ([place]) 10/11/2018   Acute pain of left shoulder 10/15/2016   Pain in right leg 10/15/2016   Weakness of right leg 10/15/2016   Cardiomyopathy ([place]) 01/25/2016   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, stage 3b ([place]) 12/13/2015   Abnormal echocardiogram 12/13/2015   Syncope 12/12/2015   Tobacco use disorder 09/07/2015   Gout 01/27/2012   Cocaine use 12/22/2011   Acute chest pain 12/18/2008   Diabetes mellitus with renal complications ([place]) 123XX123   Hypertension 04/14/2007   Asthma 04/14/2007   COPD (chronic obstructive pulmonary disease) ([place]) 04/14/2007   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 Authorizing Provider  cephALEXin (KEFLEX) 500 MG capsule Take 1 capsule (500 mg total) by mouth 4 (four) times daily for 7 days. 02/07/22 02/14/22 Yes Fransico Meadow, PA-C  meloxicam (MOBIC) 7.5 MG tablet Take 1 tablet (7.5 mg total) by mouth daily. 02/07/22  Yes Caryl Ada K, PA-C  albuterol (PROAIR HFA) 108 (90 BASE) MCG/ACT inhaler INHALE 2 PUFFS INTO THE LUNGS EVERY 6 (SIX) HOURS AS NEEDED FOR  WHEEZING. 03/05/15   Marletta Lor, MD  allopurinol (ZYLOPRIM) 100 MG tablet Take 2 tablets (200 mg total) by mouth daily. 03/05/15   Marletta Lor, MD  amLODipine (NORVASC) 5 MG tablet Take 5 mg by mouth daily. Patient not taking: Reported on 10/08/2021 09/20/21   [provider]  aspirin ([employer] ASPIRIN LOW DOSE) 81 MG EC tablet Take 1 tablet (81 mg total) by mouth daily. Swallow whole. 03/16/21   Thurnell Lose, MD  atorvastatin (LIPITOR) 20 MG tablet TAKE 1 TABLET BY MOUTH EVERY DAY 06/02/16   Marletta Lor, MD  colchicine (COLCRYS) 0.6 MG tablet Take 1 tablet (0.6 mg total) by mouth 2 (two) times daily as needed (gout flares). 02/13/21   Arrien, Jimmy Picket, MD  fluticasone (FLONASE) 50 MCG/ACT nasal spray Place 2 sprays into both nostrils 2 (two) times daily. 02/13/21   Arrien, Jimmy Picket, MD  fluticasone-salmeterol (ADVAIR) 250-50 MCG/ACT AEPB Inhale 1 puff into the lungs in the morning and at bedtime. 02/13/21   Arrien, Jimmy Picket, MD  gabapentin (NEURONTIN) 300 MG capsule Take 300 mg by mouth 3 (three) times daily. 01/19/21   [provider]  ipratropium-albuterol (DUONEB) 0.5-2.5 (3) MG/3ML SOLN Take 3 mLs by nebulization every 6 (six) hours as needed. 02/13/21   Arrien, Jimmy Picket, MD  lisinopril (ZESTRIL) 20 MG tablet Take 20 mg by mouth daily. 09/20/21   [provider]  metFORMIN (GLUCOPHAGE) 500 MG tablet Take 1 tablet (500 mg total) by mouth 2 (two) times daily. 10/13/18   Debbe Odea, MD  montelukast (SINGULAIR) 10 MG tablet Take 1 tablet (10 mg total) by mouth at bedtime. 03/05/15   Marletta Lor, MD  omeprazole (PRILOSEC) 20 MG capsule Take 1 capsule (20 mg total) by mouth daily. 03/05/15   Marletta Lor, MD  Vitamin D, Ergocalciferol, (DRISDOL) 1.25 MG (50000 UNIT) CAPS capsule Take 50,000 Units by mouth every Monday.    [provider]    Family History Family History  Problem Relation Age of Onset    Heart disease Sister    Heart disease Brother    Heart disease Brother    Heart disease Brother    Heart disease Brother     Social History Social History   Tobacco Use   Smoking status: Former    Packs/day: 0.25    Years: 57.00    Total pack years: 14.25    Types: Cigarettes    Quit date: 11/24/2015    Years since quitting: 6.2   Smokeless tobacco: Never  Substance Use Topics   Alcohol use: Not Currently    Comment: 12/12/2015 "quit drinking years ago; never had problem w/it"   Drug use: Not Currently    Types: Cocaine     Allergies   Patient has no known  allergies.   Review of Systems Review of Systems  All other systems reviewed and are negative.    Physical Exam Triage Vital Signs ED Triage Vitals  Enc Vitals Group     BP 02/07/22 1030 117/65     Pulse Rate 02/07/22 1030 99     Resp 02/07/22 1030 20     Temp 02/07/22 1030 97.6 F (36.4 C)     Temp Source 02/07/22 1030 Oral     SpO2 02/07/22 1030 98 %     Weight --      Height --      Head Circumference --      Peak Flow --      Pain Score 02/07/22 1027 10     Pain Loc --      Pain Edu? --      Excl. in GC? --    No data found.  Updated Vital Signs BP 117/65 (BP Location: Right Arm)   Pulse 99   Temp 97.6 F (36.4 C) (Oral)   Resp 20   SpO2 98%   Visual Acuity Right Eye Distance:   Left Eye Distance:   Bilateral Distance:    Right Eye Near:   Left Eye Near:    Bilateral Near:     Physical Exam Vitals reviewed.  Constitutional:      Appearance: Normal appearance.  Musculoskeletal:        General: Swelling and tenderness present.     Comments: Tender left knee swollen, arthritic appearing, full range of motion with discomfort neurovascular neurosensory are intact  Skin:    General: Skin is warm.  Neurological:     General: No focal deficit present.     Mental Status: He is alert.  Psychiatric:        Mood and Affect: Mood normal.      UC Treatments / Results  Labs (all  labs ordered are listed, but only abnormal results are displayed) Labs Reviewed  POCT URINALYSIS DIPSTICK, ED / UC - Abnormal; Notable for the following components:      Result Value   Hgb urine dipstick MODERATE (*)    Protein, ur 100 (*)    Leukocytes,Ua SMALL (*)    All other components within normal limits    EKG   Radiology DG Knee Complete 4 Views Left  Result Date: 02/07/2022 CLINICAL DATA:  Left knee pain. EXAM: LEFT KNEE - COMPLETE 4+ VIEW COMPARISON:  None Available. FINDINGS: Severe medial compartment joint space narrowing with bone-on-bone contact. Moderate peripheral degenerative osteophytes. Severe patellofemoral joint space narrowing and peripheral osteophytosis. Tiny knee joint effusion. Centrally lucent and peripherally sclerotic serpiginous density within the distal femoral diaphysis and metaphysis most suggestive of a chronic bone infarct. No acute fracture or dislocation. Mild atherosclerotic calcifications. IMPRESSION: 1. Severe medial and patellofemoral osteoarthritis. 2. Tiny knee joint effusion. 3. 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 Electronically Signed   By: Neita Garnet M.D.   On: 02/07/2022 11:50    Procedures Procedures (including critical care time)  Medications Ordered in UC Medications - No data to display  Initial Impression / Assessment and Plan / UC Course  I have reviewed the triage vital signs and the nursing notes.  Pertinent labs & imaging results that were available during my care of the patient were reviewed by me and considered in my medical decision making (see chart for details).     MDM x-ray left knee shows severe medial and patellofemoral arthritis.  UA shows leukocytes  and blood I will order a urine culture.  Patient is given prescription for meloxicam to try for arthritis and Keflex for UTI he is advised he needs to follow-up with his primary care physician for recheck of his urine in 1 week he is given the phone number for Dr.  Magnus Ivan orthopedist to follow-up with for knee pain Final Clinical Impressions(s) / UC Diagnoses   Final diagnoses:  Arthritis  Urinary tract infection with hematuria, site unspecified   Discharge Instructions   None    ED Prescriptions     Medication Sig Dispense Auth. Provider   cephALEXin (KEFLEX) 500 MG capsule Take 1 capsule (500 mg total) by mouth 4 (four) times daily for 7 days. 28 capsule Trigo Winterbottom K, [place]   meloxicam (MOBIC) 7.5 MG tablet Take 1 tablet (7.5 mg total) by mouth daily. 14 tablet Elson Areas, [place]      PDMP not reviewed this encounter.   Elson Areas, [place] 02/07/22 1235

## 2022-02-19 ENCOUNTER — Ambulatory Visit (INDEPENDENT_AMBULATORY_CARE_PROVIDER_SITE_OTHER): Payer: Medicare Other | Admitting: Podiatry

## 2022-02-19 ENCOUNTER — Encounter: Payer: Self-pay | Admitting: Podiatry

## 2022-02-19 DIAGNOSIS — M79674 Pain in right toe(s): Secondary | ICD-10-CM

## 2022-02-19 DIAGNOSIS — B351 Tinea unguium: Secondary | ICD-10-CM

## 2022-02-19 DIAGNOSIS — M79675 Pain in left toe(s): Secondary | ICD-10-CM | POA: Diagnosis not present

## 2022-02-19 DIAGNOSIS — E0821 Diabetes mellitus due to underlying condition with diabetic nephropathy: Secondary | ICD-10-CM | POA: Diagnosis not present

## 2022-02-19 NOTE — Progress Notes (Signed)
  Subjective:  Patient ID: Luis Carter, male    DOB: 1946/01/29,   MRN: [id number]  No chief complaint on file.   76 y.o. male presents for concern of thickened elongated and painful nails that are difficult to trim. Requesting to have them trimmed today. Relates burning and tingling in their feet. Patient is diabetic and last A1c was  Lab Results  Component Value Date   HGBA1C 6.6 (H) 03/12/2021   .   PCP:  Fleet Contras, MD    . Denies any other pedal complaints. Denies n/v/f/c.   Past Medical History:  Diagnosis Date   Arthritis    "left arm/shoulder; both legs" (12/12/2015)   ASTHMA    since childhood   Cardiomyopathy ([facility]) 01/25/2016   Echo 12/13/15 - Mild LVH, EF 45-50%, normal wall motion, grade 1 diastolic dysfunction, mild LAE, mild RVE, mild RAE   COPD 04/14/2007   Coronary artery disease    DIABETES MELLITUS, TYPE II 04/14/2007   GERD (gastroesophageal reflux disease)    GI bleed    Gout    High cholesterol    History of nuclear stress test    a.  Myoview 6/17: EF 54%, no ischemia   HYPERTENSION 04/14/2007   LEG PAIN, RIGHT 05/19/2008   LOW BACK PAIN 04/14/2007   Myocardial infarction ([facility])    ~ 2001/notes 09/13/2001 (03/22/2013)   PANCREATITIS, HX OF 04/14/2007   Pneumonia    "twice" (12/12/2015)   SEIZURE DISORDER 04/14/2007   "used to have them when I was young" (03/22/2013)   Shortness of breath    "related to asthma" (03/22/2013)    Objective:  Physical Exam: Vascular: DP/PT pulses 2/4 bilateral. CFT <3 seconds. Absent hair growth on digits. Edema noted to bilateral lower extremities. Xerosis noted bilaterally.  Skin. No lacerations or abrasions bilateral feet. Nails 1-5 bilateral  are thickened discolored and elongated with subungual debris.  Musculoskeletal: MMT 5/5 bilateral lower extremities in DF, PF, Inversion and Eversion. Deceased ROM in DF of ankle joint. HAV deformity noted bilateral with spurring noted dorsally on left first MPJ. Hammered digits 2-5.   Neurological: Sensation intact to light touch. Protective sensation diminished bilateral.    Assessment:   1. Diabetes mellitus due to underlying condition with diabetic nephropathy, without long-term current use of insulin (HCC)      Plan:  Patient was evaluated and treated and all questions answered. -Discussed and educated patient on diabetic foot care, especially with  regards to the vascular, neurological and musculoskeletal systems.  -Stressed the importance of good glycemic control and the detriment of not  controlling glucose levels in relation to the foot. -Discussed supportive shoes at all times and checking feet regularly.  -Mechanically debrided all nails 1-5 bilateral using sterile nail nipper and filed with dremel without incident  -Answered all patient questions -Patient to return  in 3 months for at risk foot care -Patient advised to call the office if any problems or questions arise in the meantime.   Louann Sjogren, DPM

## 2022-02-27 DIAGNOSIS — I10 Essential (primary) hypertension: Secondary | ICD-10-CM | POA: Diagnosis not present

## 2022-02-27 DIAGNOSIS — N3281 Overactive bladder: Secondary | ICD-10-CM | POA: Diagnosis not present

## 2022-02-27 DIAGNOSIS — J449 Chronic obstructive pulmonary disease, unspecified: Secondary | ICD-10-CM | POA: Diagnosis not present

## 2022-02-27 DIAGNOSIS — E1142 Type 2 diabetes mellitus with diabetic polyneuropathy: Secondary | ICD-10-CM | POA: Diagnosis not present

## 2022-02-27 DIAGNOSIS — J302 Other seasonal allergic rhinitis: Secondary | ICD-10-CM | POA: Diagnosis not present

## 2022-02-27 DIAGNOSIS — M179 Osteoarthritis of knee, unspecified: Secondary | ICD-10-CM | POA: Diagnosis not present

## 2022-03-06 ENCOUNTER — Other Ambulatory Visit: Payer: Self-pay | Admitting: Internal Medicine

## 2022-03-06 DIAGNOSIS — I10 Essential (primary) hypertension: Secondary | ICD-10-CM | POA: Diagnosis not present

## 2022-03-06 DIAGNOSIS — M5136 Other intervertebral disc degeneration, lumbar region: Secondary | ICD-10-CM | POA: Diagnosis not present

## 2022-03-06 DIAGNOSIS — N183 Chronic kidney disease, stage 3 unspecified: Secondary | ICD-10-CM | POA: Diagnosis not present

## 2022-03-06 DIAGNOSIS — J449 Chronic obstructive pulmonary disease, unspecified: Secondary | ICD-10-CM | POA: Diagnosis not present

## 2022-03-06 DIAGNOSIS — E1142 Type 2 diabetes mellitus with diabetic polyneuropathy: Secondary | ICD-10-CM | POA: Diagnosis not present

## 2022-03-07 LAB — BASIC METABOLIC PANEL WITH GFR
BUN/Creatinine Ratio: 17 (calc) (ref 6–22)
BUN: 26 mg/dL — ABNORMAL HIGH (ref 7–25)
CO2: 28 mmol/L (ref 20–32)
Calcium: 8.8 mg/dL (ref 8.6–10.3)
Chloride: 104 mmol/L (ref 98–110)
Creat: 1.56 mg/dL — ABNORMAL HIGH (ref 0.70–1.28)
Glucose, Bld: 102 mg/dL — ABNORMAL HIGH (ref 65–99)
Potassium: 4.6 mmol/L (ref 3.5–5.3)
Sodium: 139 mmol/L (ref 135–146)
eGFR: 45 mL/min/{1.73_m2} — ABNORMAL LOW (ref >=60)

## 2022-03-17 ENCOUNTER — Emergency Department (HOSPITAL_COMMUNITY): Payer: Medicare Other

## 2022-03-17 ENCOUNTER — Inpatient Hospital Stay (HOSPITAL_COMMUNITY)
Admission: EM | Admit: 2022-03-17 | Discharge: 2022-03-20 | DRG: 690 | Disposition: A | Discharge status: Home Health | Payer: Medicare Other | Attending: Internal Medicine | Admitting: Internal Medicine

## 2022-03-17 ENCOUNTER — Other Ambulatory Visit: Payer: Self-pay

## 2022-03-17 DIAGNOSIS — I130 Hypertensive heart and chronic kidney disease with heart failure and stage 1 through stage 4 chronic kidney disease, or unspecified chronic kidney disease: Secondary | ICD-10-CM | POA: Diagnosis present

## 2022-03-17 DIAGNOSIS — Z87891 Personal history of nicotine dependence: Secondary | ICD-10-CM | POA: Diagnosis not present

## 2022-03-17 DIAGNOSIS — N179 Acute kidney failure, unspecified: Secondary | ICD-10-CM | POA: Diagnosis not present

## 2022-03-17 DIAGNOSIS — E0821 Diabetes mellitus due to underlying condition with diabetic nephropathy: Secondary | ICD-10-CM

## 2022-03-17 DIAGNOSIS — E785 Hyperlipidemia, unspecified: Secondary | ICD-10-CM | POA: Diagnosis present

## 2022-03-17 DIAGNOSIS — M109 Gout, unspecified: Secondary | ICD-10-CM | POA: Diagnosis present

## 2022-03-17 DIAGNOSIS — E1151 Type 2 diabetes mellitus with diabetic peripheral angiopathy without gangrene: Secondary | ICD-10-CM | POA: Diagnosis present

## 2022-03-17 DIAGNOSIS — N1832 Chronic kidney disease, stage 3b: Secondary | ICD-10-CM | POA: Diagnosis not present

## 2022-03-17 DIAGNOSIS — I5032 Chronic diastolic (congestive) heart failure: Secondary | ICD-10-CM | POA: Diagnosis present

## 2022-03-17 DIAGNOSIS — G40909 Epilepsy, unspecified, not intractable, without status epilepticus: Secondary | ICD-10-CM | POA: Diagnosis present

## 2022-03-17 DIAGNOSIS — I2510 Atherosclerotic heart disease of native coronary artery without angina pectoris: Secondary | ICD-10-CM | POA: Diagnosis present

## 2022-03-17 DIAGNOSIS — Z955 Presence of coronary angioplasty implant and graft: Secondary | ICD-10-CM

## 2022-03-17 DIAGNOSIS — R079 Chest pain, unspecified: Secondary | ICD-10-CM | POA: Diagnosis not present

## 2022-03-17 DIAGNOSIS — Z7982 Long term (current) use of aspirin: Secondary | ICD-10-CM | POA: Diagnosis not present

## 2022-03-17 DIAGNOSIS — Z79899 Other long term (current) drug therapy: Secondary | ICD-10-CM

## 2022-03-17 DIAGNOSIS — B9620 Unspecified Escherichia coli [E. coli] as the cause of diseases classified elsewhere: Secondary | ICD-10-CM | POA: Diagnosis present

## 2022-03-17 DIAGNOSIS — R0602 Shortness of breath: Secondary | ICD-10-CM | POA: Diagnosis not present

## 2022-03-17 DIAGNOSIS — J449 Chronic obstructive pulmonary disease, unspecified: Secondary | ICD-10-CM | POA: Diagnosis not present

## 2022-03-17 DIAGNOSIS — I2584 Coronary atherosclerosis due to calcified coronary lesion: Secondary | ICD-10-CM | POA: Diagnosis not present

## 2022-03-17 DIAGNOSIS — Z7984 Long term (current) use of oral hypoglycemic drugs: Secondary | ICD-10-CM

## 2022-03-17 DIAGNOSIS — I429 Cardiomyopathy, unspecified: Secondary | ICD-10-CM | POA: Diagnosis not present

## 2022-03-17 DIAGNOSIS — Z8249 Family history of ischemic heart disease and other diseases of the circulatory system: Secondary | ICD-10-CM

## 2022-03-17 DIAGNOSIS — I252 Old myocardial infarction: Secondary | ICD-10-CM | POA: Diagnosis not present

## 2022-03-17 DIAGNOSIS — K219 Gastro-esophageal reflux disease without esophagitis: Secondary | ICD-10-CM | POA: Diagnosis not present

## 2022-03-17 DIAGNOSIS — Z7951 Long term (current) use of inhaled steroids: Secondary | ICD-10-CM

## 2022-03-17 DIAGNOSIS — R778 Other specified abnormalities of plasma proteins: Secondary | ICD-10-CM

## 2022-03-17 DIAGNOSIS — E7800 Pure hypercholesterolemia, unspecified: Secondary | ICD-10-CM | POA: Diagnosis present

## 2022-03-17 DIAGNOSIS — E1122 Type 2 diabetes mellitus with diabetic chronic kidney disease: Secondary | ICD-10-CM | POA: Diagnosis present

## 2022-03-17 DIAGNOSIS — N10 Acute tubulo-interstitial nephritis: Secondary | ICD-10-CM | POA: Diagnosis not present

## 2022-03-17 DIAGNOSIS — E1165 Type 2 diabetes mellitus with hyperglycemia: Secondary | ICD-10-CM | POA: Diagnosis present

## 2022-03-17 DIAGNOSIS — E1129 Type 2 diabetes mellitus with other diabetic kidney complication: Secondary | ICD-10-CM | POA: Diagnosis present

## 2022-03-17 DIAGNOSIS — N12 Tubulo-interstitial nephritis, not specified as acute or chronic: Secondary | ICD-10-CM

## 2022-03-17 DIAGNOSIS — I10 Essential (primary) hypertension: Secondary | ICD-10-CM | POA: Diagnosis present

## 2022-03-17 LAB — I-STAT CHEM 8, ED
BUN: 22 mg/dL (ref 8–23)
Calcium, Ion: 1.07 mmol/L — ABNORMAL LOW (ref 1.15–1.40)
Chloride: 105 mmol/L (ref 98–111)
Creatinine, Ser: 1.7 mg/dL — ABNORMAL HIGH (ref 0.61–1.24)
Glucose, Bld: 128 mg/dL — ABNORMAL HIGH (ref 70–99)
HCT: 40 % (ref 39.0–52.0)
Hemoglobin: 13.6 g/dL (ref 13.0–17.0)
Potassium: 4.3 mmol/L (ref 3.5–5.1)
Sodium: 139 mmol/L (ref 135–145)
TCO2: 21 mmol/L — ABNORMAL LOW (ref 22–32)

## 2022-03-17 LAB — CBC
HCT: 38.4 % — ABNORMAL LOW (ref 39.0–52.0)
Hemoglobin: 12.4 g/dL — ABNORMAL LOW (ref 13.0–17.0)
MCH: 27.1 pg (ref 26.0–34.0)
MCHC: 32.3 g/dL (ref 30.0–36.0)
MCV: 84 fL (ref 80.0–100.0)
Platelets: 285 10*3/uL (ref 150–400)
RBC: 4.57 MIL/uL (ref 4.22–5.81)
RDW: 15.4 % (ref 11.5–15.5)
WBC: 11.7 10*3/uL — ABNORMAL HIGH (ref 4.0–10.5)
nRBC: 0 % (ref 0.0–0.2)

## 2022-03-17 LAB — BRAIN NATRIURETIC PEPTIDE: B Natriuretic Peptide: 45.2 pg/mL (ref 0.0–100.0)

## 2022-03-17 LAB — COMPREHENSIVE METABOLIC PANEL
ALT: 13 U/L (ref 0–44)
AST: 22 U/L (ref 15–41)
Albumin: 3.5 g/dL (ref 3.5–5.0)
Alkaline Phosphatase: 84 U/L (ref 38–126)
Anion gap: 10 (ref 5–15)
BUN: 21 mg/dL (ref 8–23)
CO2: 22 mmol/L (ref 22–32)
Calcium: 8.7 mg/dL — ABNORMAL LOW (ref 8.9–10.3)
Chloride: 106 mmol/L (ref 98–111)
Creatinine, Ser: 1.71 mg/dL — ABNORMAL HIGH (ref 0.61–1.24)
GFR, Estimated: 40 mL/min — ABNORMAL LOW (ref >60)
Glucose, Bld: 129 mg/dL — ABNORMAL HIGH (ref 70–99)
Potassium: 4.5 mmol/L (ref 3.5–5.1)
Sodium: 138 mmol/L (ref 135–145)
Total Bilirubin: 0.6 mg/dL (ref 0.3–1.2)
Total Protein: 7.2 g/dL (ref 6.5–8.1)

## 2022-03-17 LAB — TROPONIN I (HIGH SENSITIVITY): Troponin I (High Sensitivity): 32 ng/L — ABNORMAL HIGH (ref <18)

## 2022-03-17 LAB — LIPASE, BLOOD: Lipase: 26 U/L (ref 11–51)

## 2022-03-17 MED ORDER — HALOPERIDOL LACTATE 5 MG/ML IJ SOLN
2.5000 mg | Freq: Once | INTRAMUSCULAR | Status: AC
  Administered 2022-03-17: 2.5 mg via INTRAMUSCULAR

## 2022-03-17 MED ORDER — IOHEXOL 350 MG/ML SOLN
80.0000 mL | Freq: Once | INTRAVENOUS | Status: AC | PRN
  Administered 2022-03-17: 80 mL via INTRAVENOUS

## 2022-03-17 MED ORDER — HALOPERIDOL LACTATE 5 MG/ML IJ SOLN
INTRAMUSCULAR | Status: AC
  Filled 2022-03-17: qty 1

## 2022-03-17 NOTE — ED Notes (Signed)
[place] CT tech to call RN when available CT for pt.

## 2022-03-17 NOTE — ED Notes (Addendum)
Pt brought back from lobby to room 16 by triage rn. Pt continues to complain of CP. Agitated repeating "oh, lord help me." "Help me lord help me."  VS updated. 18G RAC. PA Misty Stanley at bedside

## 2022-03-17 NOTE — ED Provider Notes (Signed)
[name] EMERGENCY DEPARTMENT Provider Note   CSN: [id number] Arrival date & time: 03/17/22  2113     History {Add pertinent medical, surgical, social history, OB history to HPI:1} Chief Complaint  Patient presents with   Shortness of Breath   Chest Pain    Luis Carter is a 76 y.o. male.  The history is provided by the patient and medical records.  Shortness of Breath Associated symptoms: chest pain   Chest Pain Associated symptoms: shortness of breath    76 y.o. M with hx of COPD, gout, pancreatitis, DM2, GERD, HTN, CAD, hx of prior MI (2003, 2014) presenting to the ED with chest pain.  History is very limited as patient somewhat uncooperative and answering limited questions.  He states he started having pain today around noon, center of chest and feels it in his back.  He denies abdominal pain.  No numbness/tingling of the extremities.  He cannot give further history, continues saying "help me jesus".  He does admit to 2 MI's in the past.  Home Medications Prior to Admission medications   Medication Sig Start Date End Date Taking? Authorizing Provider  albuterol (PROAIR HFA) 108 (90 BASE) MCG/ACT inhaler INHALE 2 PUFFS INTO THE LUNGS EVERY 6 (SIX) HOURS AS NEEDED FOR WHEEZING. 03/05/15   Gordy Savers, MD  allopurinol (ZYLOPRIM) 100 MG tablet Take 2 tablets (200 mg total) by mouth daily. 03/05/15   Gordy Savers, MD  amLODipine (NORVASC) 5 MG tablet Take 5 mg by mouth daily. Patient not taking: Reported on 10/08/2021 09/20/21   [provider]  aspirin ([employer] ASPIRIN LOW DOSE) 81 MG EC tablet Take 1 tablet (81 mg total) by mouth daily. Swallow whole. 03/16/21   Leroy Sea, MD  atorvastatin (LIPITOR) 20 MG tablet TAKE 1 TABLET BY MOUTH EVERY DAY 06/02/16   Gordy Savers, MD  colchicine (COLCRYS) 0.6 MG tablet Take 1 tablet (0.6 mg total) by mouth 2 (two) times daily as needed (gout flares). 02/13/21   Arrien, York Ram,  MD  fluticasone (FLONASE) 50 MCG/ACT nasal spray Place 2 sprays into both nostrils 2 (two) times daily. 02/13/21   Arrien, York Ram, MD  fluticasone-salmeterol (ADVAIR) 250-50 MCG/ACT AEPB Inhale 1 puff into the lungs in the morning and at bedtime. 02/13/21   Arrien, York Ram, MD  gabapentin (NEURONTIN) 300 MG capsule Take 300 mg by mouth 3 (three) times daily. 01/19/21   [provider]  ipratropium-albuterol (DUONEB) 0.5-2.5 (3) MG/3ML SOLN Take 3 mLs by nebulization every 6 (six) hours as needed. 02/13/21   Arrien, York Ram, MD  lisinopril (ZESTRIL) 20 MG tablet Take 20 mg by mouth daily. 09/20/21   [provider]  meloxicam (MOBIC) 7.5 MG tablet Take 1 tablet (7.5 mg total) by mouth daily. 02/07/22   Elson Areas, PA-C  metFORMIN (GLUCOPHAGE) 500 MG tablet Take 1 tablet (500 mg total) by mouth 2 (two) times daily. 10/13/18   Calvert Cantor, MD  montelukast (SINGULAIR) 10 MG tablet Take 1 tablet (10 mg total) by mouth at bedtime. 03/05/15   Gordy Savers, MD  omeprazole (PRILOSEC) 20 MG capsule Take 1 capsule (20 mg total) by mouth daily. 03/05/15   Gordy Savers, MD  Vitamin D, Ergocalciferol, (DRISDOL) 1.25 MG (50000 UNIT) CAPS capsule Take 50,000 Units by mouth every Monday.    [provider]      Allergies    Patient has no known allergies.    Review of  Systems   Review of Systems  Respiratory:  Positive for shortness of breath.   Cardiovascular:  Positive for chest pain.  All other systems reviewed and are negative.   Physical Exam Updated Vital Signs BP (!) 133/97 (BP Location: Left Arm)   Pulse 95   Temp 97.8 F (36.6 C) (Oral)   Resp (!) 29   SpO2 99%   Physical Exam Vitals and nursing note reviewed.  Constitutional:      Appearance: He is well-developed.  HENT:     Head: Normocephalic and atraumatic.  Eyes:     Conjunctiva/sclera: Conjunctivae normal.     Pupils: Pupils are equal, round, and reactive to  light.  Cardiovascular:     Rate and Rhythm: Normal rate and regular rhythm.     Heart sounds: Normal heart sounds.  Pulmonary:     Effort: Pulmonary effort is normal.     Breath sounds: Normal breath sounds. No wheezing or rhonchi.  Abdominal:     General: Bowel sounds are normal.     Palpations: Abdomen is soft.  Musculoskeletal:        General: Normal range of motion.     Cervical back: Normal range of motion.     Comments: DP pulse intact BLE  Skin:    General: Skin is warm and dry.  Neurological:     Mental Status: He is alert and oriented to person, place, and time.     ED Results / Procedures / Treatments   Labs (all labs ordered are listed, but only abnormal results are displayed) Labs Reviewed  CBC - Abnormal; Notable for the following components:      Result Value   WBC 11.7 (*)    Hemoglobin 12.4 (*)    HCT 38.4 (*)    All other components within normal limits  COMPREHENSIVE METABOLIC PANEL - Abnormal; Notable for the following components:   Glucose, Bld 129 (*)    Creatinine, Ser 1.71 (*)    Calcium 8.7 (*)    GFR, Estimated 40 (*)    All other components within normal limits  I-STAT CHEM 8, ED - Abnormal; Notable for the following components:   Creatinine, Ser 1.70 (*)    Glucose, Bld 128 (*)    Calcium, Ion 1.07 (*)    TCO2 21 (*)    All other components within normal limits  TROPONIN I (HIGH SENSITIVITY) - Abnormal; Notable for the following components:   Troponin I (High Sensitivity) 32 (*)    All other components within normal limits  LIPASE, BLOOD  BRAIN NATRIURETIC PEPTIDE  TROPONIN I (HIGH SENSITIVITY)    EKG EKG Interpretation  Date/Time:  Monday March 17 2022 21:33:20 EDT Ventricular Rate:  103 PR Interval:  148 QRS Duration: 80 QT Interval:  328 QTC Calculation: 429 R Axis:   21 Text Interpretation: Sinus tachycardia Otherwise normal ECG When compared with ECG of 15-Sep-2021 17:20, PREVIOUS ECG IS PRESENT Confirmed by Norman Clay  ([id number]) on 03/17/2022 10:10:38 PM  Radiology No results found.  Procedures Procedures  {Document cardiac monitor, telemetry assessment procedure when appropriate:1}  Medications Ordered in ED Medications  haloperidol lactate (HALDOL) injection 2.5 mg (2.5 mg Intramuscular Given 03/17/22 2219)    ED Course/ Medical Decision Making/ A&P                           Medical Decision Making Amount and/or Complexity of Data Reviewed Labs: ordered. Radiology: ordered.  Risk  Prescription drug management.   ***  {Document critical care time when appropriate:1} {Document review of labs and clinical decision tools ie heart score, Chads2Vasc2 etc:1}  {Document your independent review of radiology images, and any outside records:1} {Document your discussion with family members, caretakers, and with consultants:1} {Document social determinants of health affecting pt's care:1} {Document your decision making why or why not admission, treatments were needed:1} Final Clinical Impression(s) / ED Diagnoses Final diagnoses:  None    Rx / DC Orders ED Discharge Orders     None

## 2022-03-17 NOTE — ED Provider Triage Note (Signed)
Emergency Medicine Provider Triage Evaluation Note  Luis Carter , a 76 y.o. male  was evaluated in triage.  Pt complains of chest pain shortness of breath.  All the patient will do is state "help me Jesus."  He is unable to give any further history other than his chest hurts and it started tonight.  Patient states it is very hard to breathe "help me Jesus.".  Review of Systems  Positive: sob Negative: diaphoresis  Physical Exam  BP 137/81 (BP Location: Right Arm)   Pulse (!) 33   Temp 97.8 F (36.6 C) (Oral)   Resp (!) 22   SpO2 (!) 65%  Gen:   Awake, no distress   Resp:  Normal effort  MSK:   Moves extremities without difficulty  Other:  Patient rocking back and forth with his eyes closed stating "Lord help me Jesus."'s are clear to auscultation bilaterally, transmitted upper airway sounds  Medical Decision Making  Medically screening exam initiated at 9:36 PM.  Appropriate orders placed.  GREGOIRE BENNIS was informed that the remainder of the evaluation will be completed by another provider, this initial triage assessment does not replace that evaluation, and the importance of remaining in the ED until their evaluation is complete.  Work-up initiated   Arthor Captain, PA-C 03/17/22 2142

## 2022-03-17 NOTE — ED Triage Notes (Signed)
Pt here for shob and central cp that started this evening. Pt not answering many questions in triage, pt is rocking back and forth w/ eyes closed, stating "help me jesus". Unable to obtain SpO2 due to pt's tremors. Pt unable to provide any other info.

## 2022-03-17 NOTE — ED Notes (Signed)
XR at bedside

## 2022-03-18 ENCOUNTER — Encounter (HOSPITAL_COMMUNITY): Payer: Self-pay | Admitting: Internal Medicine

## 2022-03-18 ENCOUNTER — Observation Stay (HOSPITAL_COMMUNITY): Payer: Medicare Other

## 2022-03-18 DIAGNOSIS — Z955 Presence of coronary angioplasty implant and graft: Secondary | ICD-10-CM | POA: Diagnosis not present

## 2022-03-18 DIAGNOSIS — I5032 Chronic diastolic (congestive) heart failure: Secondary | ICD-10-CM

## 2022-03-18 DIAGNOSIS — R079 Chest pain, unspecified: Secondary | ICD-10-CM

## 2022-03-18 DIAGNOSIS — Z7984 Long term (current) use of oral hypoglycemic drugs: Secondary | ICD-10-CM | POA: Diagnosis not present

## 2022-03-18 DIAGNOSIS — E1122 Type 2 diabetes mellitus with diabetic chronic kidney disease: Secondary | ICD-10-CM | POA: Diagnosis present

## 2022-03-18 DIAGNOSIS — R778 Other specified abnormalities of plasma proteins: Secondary | ICD-10-CM

## 2022-03-18 DIAGNOSIS — J449 Chronic obstructive pulmonary disease, unspecified: Secondary | ICD-10-CM | POA: Diagnosis present

## 2022-03-18 DIAGNOSIS — G40909 Epilepsy, unspecified, not intractable, without status epilepticus: Secondary | ICD-10-CM | POA: Diagnosis present

## 2022-03-18 DIAGNOSIS — Z7951 Long term (current) use of inhaled steroids: Secondary | ICD-10-CM | POA: Diagnosis not present

## 2022-03-18 DIAGNOSIS — I2510 Atherosclerotic heart disease of native coronary artery without angina pectoris: Secondary | ICD-10-CM | POA: Diagnosis present

## 2022-03-18 DIAGNOSIS — M1000 Idiopathic gout, unspecified site: Secondary | ICD-10-CM

## 2022-03-18 DIAGNOSIS — M109 Gout, unspecified: Secondary | ICD-10-CM | POA: Diagnosis present

## 2022-03-18 DIAGNOSIS — E0821 Diabetes mellitus due to underlying condition with diabetic nephropathy: Secondary | ICD-10-CM

## 2022-03-18 DIAGNOSIS — I10 Essential (primary) hypertension: Secondary | ICD-10-CM

## 2022-03-18 DIAGNOSIS — N10 Acute tubulo-interstitial nephritis: Secondary | ICD-10-CM | POA: Diagnosis present

## 2022-03-18 DIAGNOSIS — I429 Cardiomyopathy, unspecified: Secondary | ICD-10-CM | POA: Diagnosis present

## 2022-03-18 DIAGNOSIS — Z8249 Family history of ischemic heart disease and other diseases of the circulatory system: Secondary | ICD-10-CM | POA: Diagnosis not present

## 2022-03-18 DIAGNOSIS — E785 Hyperlipidemia, unspecified: Secondary | ICD-10-CM

## 2022-03-18 DIAGNOSIS — N1832 Chronic kidney disease, stage 3b: Secondary | ICD-10-CM | POA: Diagnosis present

## 2022-03-18 DIAGNOSIS — Z87891 Personal history of nicotine dependence: Secondary | ICD-10-CM | POA: Diagnosis not present

## 2022-03-18 DIAGNOSIS — B9620 Unspecified Escherichia coli [E. coli] as the cause of diseases classified elsewhere: Secondary | ICD-10-CM | POA: Diagnosis present

## 2022-03-18 DIAGNOSIS — K219 Gastro-esophageal reflux disease without esophagitis: Secondary | ICD-10-CM | POA: Diagnosis present

## 2022-03-18 DIAGNOSIS — I252 Old myocardial infarction: Secondary | ICD-10-CM | POA: Diagnosis not present

## 2022-03-18 DIAGNOSIS — N179 Acute kidney failure, unspecified: Secondary | ICD-10-CM | POA: Diagnosis present

## 2022-03-18 DIAGNOSIS — Z7982 Long term (current) use of aspirin: Secondary | ICD-10-CM | POA: Diagnosis not present

## 2022-03-18 DIAGNOSIS — E1151 Type 2 diabetes mellitus with diabetic peripheral angiopathy without gangrene: Secondary | ICD-10-CM | POA: Diagnosis present

## 2022-03-18 DIAGNOSIS — Z79899 Other long term (current) drug therapy: Secondary | ICD-10-CM | POA: Diagnosis not present

## 2022-03-18 DIAGNOSIS — I130 Hypertensive heart and chronic kidney disease with heart failure and stage 1 through stage 4 chronic kidney disease, or unspecified chronic kidney disease: Secondary | ICD-10-CM | POA: Diagnosis present

## 2022-03-18 DIAGNOSIS — E7800 Pure hypercholesterolemia, unspecified: Secondary | ICD-10-CM | POA: Diagnosis present

## 2022-03-18 DIAGNOSIS — R7989 Other specified abnormal findings of blood chemistry: Secondary | ICD-10-CM

## 2022-03-18 LAB — URINALYSIS, ROUTINE W REFLEX MICROSCOPIC
Bilirubin Urine: NEGATIVE
Glucose, UA: NEGATIVE mg/dL
Ketones, ur: NEGATIVE mg/dL
Nitrite: NEGATIVE
Protein, ur: NEGATIVE mg/dL
Specific Gravity, Urine: 1.028 (ref 1.005–1.030)
WBC, UA: >50 WBC/hpf — ABNORMAL HIGH (ref 0–5)
pH: 6 (ref 5.0–8.0)

## 2022-03-18 LAB — CBC
HCT: 38.5 % — ABNORMAL LOW (ref 39.0–52.0)
Hemoglobin: 12 g/dL — ABNORMAL LOW (ref 13.0–17.0)
MCH: 26.4 pg (ref 26.0–34.0)
MCHC: 31.2 g/dL (ref 30.0–36.0)
MCV: 84.8 fL (ref 80.0–100.0)
Platelets: 273 10*3/uL (ref 150–400)
RBC: 4.54 MIL/uL (ref 4.22–5.81)
RDW: 15.5 % (ref 11.5–15.5)
WBC: 13.5 10*3/uL — ABNORMAL HIGH (ref 4.0–10.5)
nRBC: 0 % (ref 0.0–0.2)

## 2022-03-18 LAB — TROPONIN I (HIGH SENSITIVITY)
Troponin I (High Sensitivity): 48 ng/L — ABNORMAL HIGH (ref <18)
Troponin I (High Sensitivity): 82 ng/L — ABNORMAL HIGH (ref <18)

## 2022-03-18 LAB — HEMOGLOBIN A1C
Hgb A1c MFr Bld: 6.7 % — ABNORMAL HIGH (ref 4.8–5.6)
Mean Plasma Glucose: 145.59 mg/dL

## 2022-03-18 LAB — ECHOCARDIOGRAM COMPLETE: Area-P 1/2: 2.16 cm2

## 2022-03-18 LAB — GLUCOSE, CAPILLARY
Glucose-Capillary: 150 mg/dL — ABNORMAL HIGH (ref 70–99)
Glucose-Capillary: 102 mg/dL — ABNORMAL HIGH (ref 70–99)

## 2022-03-18 LAB — CBG MONITORING, ED
Glucose-Capillary: 88 mg/dL (ref 70–99)
Glucose-Capillary: 130 mg/dL — ABNORMAL HIGH (ref 70–99)

## 2022-03-18 MED ORDER — ACETAMINOPHEN 650 MG RE SUPP: 650.0000 mg | Freq: Four times a day (QID) | RECTAL | Status: DC | PRN

## 2022-03-18 MED ORDER — ASPIRIN 81 MG PO TBEC
81.0000 mg | DELAYED_RELEASE_TABLET | Freq: Every day | ORAL | Status: DC
  Administered 2022-03-18 – 2022-03-20 (×3): 81 mg via ORAL
  Filled 2022-03-18 (×3): qty 1

## 2022-03-18 MED ORDER — LISINOPRIL 20 MG PO TABS
20.0000 mg | ORAL_TABLET | Freq: Every day | ORAL | Status: DC
  Administered 2022-03-18: 20 mg via ORAL
  Filled 2022-03-18: qty 1

## 2022-03-18 MED ORDER — PANTOPRAZOLE SODIUM 40 MG PO TBEC
40.0000 mg | DELAYED_RELEASE_TABLET | Freq: Every day | ORAL | Status: DC
  Administered 2022-03-18 – 2022-03-20 (×3): 40 mg via ORAL
  Filled 2022-03-18 (×3): qty 1

## 2022-03-18 MED ORDER — IPRATROPIUM-ALBUTEROL 0.5-2.5 (3) MG/3ML IN SOLN: 3.0000 mL | Freq: Four times a day (QID) | RESPIRATORY_TRACT | Status: DC | PRN

## 2022-03-18 MED ORDER — MONTELUKAST SODIUM 10 MG PO TABS
10.0000 mg | ORAL_TABLET | Freq: Every day | ORAL | Status: DC
  Administered 2022-03-18 – 2022-03-19 (×2): 10 mg via ORAL
  Filled 2022-03-18 (×2): qty 1

## 2022-03-18 MED ORDER — MOMETASONE FURO-FORMOTEROL FUM 200-5 MCG/ACT IN AERO
2.0000 | INHALATION_SPRAY | Freq: Two times a day (BID) | RESPIRATORY_TRACT | Status: DC
  Administered 2022-03-18 – 2022-03-20 (×4): 2 via RESPIRATORY_TRACT
  Filled 2022-03-18: qty 8.8

## 2022-03-18 MED ORDER — SODIUM CHLORIDE 0.9 % IV SOLN
1.0000 g | INTRAVENOUS | Status: DC
  Administered 2022-03-19 – 2022-03-20 (×2): 1 g via INTRAVENOUS
  Filled 2022-03-18 (×2): qty 10

## 2022-03-18 MED ORDER — COLCHICINE 0.6 MG PO TABS: 0.6000 mg | ORAL_TABLET | Freq: Two times a day (BID) | ORAL | Status: DC | PRN

## 2022-03-18 MED ORDER — INSULIN ASPART 100 UNIT/ML IJ SOLN
0.0000 [IU] | Freq: Three times a day (TID) | INTRAMUSCULAR | Status: DC
  Administered 2022-03-18: 1 [IU] via SUBCUTANEOUS
  Administered 2022-03-19: 2 [IU] via SUBCUTANEOUS
  Administered 2022-03-19: 1 [IU] via SUBCUTANEOUS
  Administered 2022-03-20: 2 [IU] via SUBCUTANEOUS

## 2022-03-18 MED ORDER — GABAPENTIN 300 MG PO CAPS
300.0000 mg | ORAL_CAPSULE | Freq: Three times a day (TID) | ORAL | Status: DC
  Administered 2022-03-18 – 2022-03-20 (×6): 300 mg via ORAL
  Filled 2022-03-18 (×6): qty 1

## 2022-03-18 MED ORDER — SODIUM CHLORIDE 0.9 % IV SOLN
1.0000 g | Freq: Once | INTRAVENOUS | Status: AC
  Administered 2022-03-18: 1 g via INTRAVENOUS
  Filled 2022-03-18: qty 10

## 2022-03-18 MED ORDER — FLUTICASONE PROPIONATE 50 MCG/ACT NA SUSP
2.0000 | Freq: Two times a day (BID) | NASAL | Status: DC
Start: 2022-03-18 — End: 2022-03-20
  Administered 2022-03-18 – 2022-03-20 (×4): 2 via NASAL
  Filled 2022-03-18: qty 16

## 2022-03-18 MED ORDER — ATORVASTATIN CALCIUM 10 MG PO TABS
20.0000 mg | ORAL_TABLET | Freq: Every day | ORAL | Status: DC
  Administered 2022-03-18 – 2022-03-20 (×3): 20 mg via ORAL
  Filled 2022-03-18 (×2): qty 2

## 2022-03-18 MED ORDER — ALBUTEROL SULFATE HFA 108 (90 BASE) MCG/ACT IN AERS
2.0000 | INHALATION_SPRAY | Freq: Four times a day (QID) | RESPIRATORY_TRACT | Status: DC | PRN
Start: 2022-03-18 — End: 2022-03-18

## 2022-03-18 MED ORDER — PERFLUTREN LIPID MICROSPHERE
1.0000 mL | INTRAVENOUS | Status: AC | PRN
  Administered 2022-03-18: 2 mL via INTRAVENOUS

## 2022-03-18 MED ORDER — ALLOPURINOL 100 MG PO TABS
200.0000 mg | ORAL_TABLET | Freq: Every day | ORAL | Status: DC
  Administered 2022-03-18 – 2022-03-20 (×3): 200 mg via ORAL
  Filled 2022-03-18 (×3): qty 2

## 2022-03-18 MED ORDER — HEPARIN SODIUM (PORCINE) 5000 UNIT/ML IJ SOLN
5000.0000 [IU] | Freq: Three times a day (TID) | INTRAMUSCULAR | Status: DC
  Administered 2022-03-18 – 2022-03-20 (×7): 5000 [IU] via SUBCUTANEOUS
  Filled 2022-03-18 (×7): qty 1

## 2022-03-18 MED ORDER — ACETAMINOPHEN 325 MG PO TABS
650.0000 mg | ORAL_TABLET | Freq: Four times a day (QID) | ORAL | Status: DC | PRN
  Administered 2022-03-18: 650 mg via ORAL
  Filled 2022-03-18: qty 2

## 2022-03-18 MED ORDER — INSULIN ASPART 100 UNIT/ML IJ SOLN: 0.0000 [IU] | Freq: Every day | INTRAMUSCULAR | Status: DC
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
## 2022-03-18 NOTE — ED Notes (Signed)
Zigmond Trela sister [phone] requesting an update on the patient

## 2022-03-18 NOTE — Hospital Course (Addendum)
Luis Carter is a 76 y.o. male with medical history significant of chronic diastolic CHF, peripheral arterial disease, coronary artery disease, type 2 diabetes mellitus, GERD, gout, hypertension, hyperlipidemia, CKD stage IIIb presented to the ED with complaints of chest pain and shortness of breath.  Patient complained of sudden substernal chest pain with dyspnea and diaphoresis which subsided welcome to the hospital and had also complained of right flank pain.  In the ED patient had a stable vitals.  Labs showed a creatinine of 1.7.  EKG was unremarkable.  Troponin was mildly elevated at  32 > 48.  BNP normal.  Urinalysis with large amount of leukocytes and microscopy showing greater than 50 WBCs and many bacteria.  CTA chest/abdomen/pelvis negative for aortic dissection or PE but does show heterogeneous enhancement of the right kidney concerning for pyelonephritis. Patient was given ceftriaxone azactam to hospital for further evaluation and treatment.   Assessment and plan Chest pain with mild troponin elevation History of CAD with prior MI x2 EKG with baseline wander. High-sensitivity troponin mildly elevated (32 > 48>82).  CTA negative for aortic dissection or PE.  Check 2D echocardiogram.  Patient is chest pain-free at this time.   Chronic diastolic CHF Patient is currently euvolemic and compensated.  Continue Lipitor on ACE inhibitor's at home will resume.  Not on diuretic.   Noninsulin-dependent type 2 diabetes Hemoglobin A1c of 6.6, 1-year back.  Repeat A1c today at 6.7.  Continue sliding scale insulin.  Hold metformin for now   CKD stage IIIb Creatinine stable.  Creatinine at 1.7.  We will continue to monitor.   Hypertension: Continue lisinopril.  Discontinue meloxicam.   Hyperlipidemia Continue Lipitor.   Gout Continue allopurinol, colchicine.   GERD Continue pantoprazole.

## 2022-03-18 NOTE — Progress Notes (Addendum)
Same day note  Patient seen and examined at bedside.  Patient was admitted to the hospital for chest pain and shortness of breath.  At the time of my evaluation, patient complains of no chest pain, dyspnea or shortness of breath.  Physical examination reveals average built male, not in obvious distress.  Laboratory data and imaging was reviewed  Assessment and Plan.  Chest pain with mild troponin elevation History of CAD with prior MI x2 EKG with baseline wander. High-sensitivity troponin mildly elevated (32 > 48>82).  CTA negative for aortic dissection or PE.  Check 2D echocardiogram.  Patient is chest pain-free at this time.   Chronic diastolic CHF Patient is currently euvolemic and compensated.  Continue Lipitor on ACE inhibitor's at home will resume.  Not on diuretic.   Noninsulin-dependent type 2 diabetes Hemoglobin A1c of 6.6, 1-year back.  Repeat A1c today at 6.7.  Continue sliding scale insulin.  Hold metformin for now   CKD stage IIIb Creatinine stable.  Creatinine at 1.7.  We will continue to monitor.   Hypertension: Continue lisinopril.  Discontinue meloxicam.  Hyperlipidemia Continue Lipitor.  Gout Continue allopurinol, colchicine.  Acute pyelonephritis. On IV Rocephin.  Follow-up urine cultures.  GERD Continue pantoprazole.  No Charge  Signed,  Tenny Craw, MD [facility]

## 2022-03-18 NOTE — ED Notes (Signed)
After adminsitration of haldol, pt is not resting comfortably. Awaiting CTA. Visible from nurses station. Connected to VS monitor system.

## 2022-03-18 NOTE — ED Notes (Signed)
Pt transported to Echo. 

## 2022-03-18 NOTE — Progress Notes (Signed)
Patient transferred from ED at 1518hrs. Oriented to room and plan of care for shift.  Noted to be confused to time and place.  Bed alarm on and call bell within reach.

## 2022-03-19 DIAGNOSIS — N10 Acute tubulo-interstitial nephritis: Secondary | ICD-10-CM | POA: Diagnosis not present

## 2022-03-19 LAB — URINE CULTURE: Culture: >=100000 — AB

## 2022-03-19 LAB — CBC
HCT: 34.3 % — ABNORMAL LOW (ref 39.0–52.0)
Hemoglobin: 11.1 g/dL — ABNORMAL LOW (ref 13.0–17.0)
MCH: 26.8 pg (ref 26.0–34.0)
MCHC: 32.4 g/dL (ref 30.0–36.0)
MCV: 82.9 fL (ref 80.0–100.0)
Platelets: 242 10*3/uL (ref 150–400)
RBC: 4.14 MIL/uL — ABNORMAL LOW (ref 4.22–5.81)
RDW: 15.3 % (ref 11.5–15.5)
WBC: 10.6 10*3/uL — ABNORMAL HIGH (ref 4.0–10.5)
nRBC: 0 % (ref 0.0–0.2)

## 2022-03-19 LAB — BASIC METABOLIC PANEL
Anion gap: 8 (ref 5–15)
BUN: 31 mg/dL — ABNORMAL HIGH (ref 8–23)
CO2: 21 mmol/L — ABNORMAL LOW (ref 22–32)
Calcium: 8.3 mg/dL — ABNORMAL LOW (ref 8.9–10.3)
Chloride: 106 mmol/L (ref 98–111)
Creatinine, Ser: 2.14 mg/dL — ABNORMAL HIGH (ref 0.61–1.24)
GFR, Estimated: 31 mL/min — ABNORMAL LOW (ref >60)
Glucose, Bld: 121 mg/dL — ABNORMAL HIGH (ref 70–99)
Potassium: 4.4 mmol/L (ref 3.5–5.1)
Sodium: 135 mmol/L (ref 135–145)

## 2022-03-19 LAB — GLUCOSE, CAPILLARY
Glucose-Capillary: 112 mg/dL — ABNORMAL HIGH (ref 70–99)
Glucose-Capillary: 179 mg/dL — ABNORMAL HIGH (ref 70–99)
Glucose-Capillary: 131 mg/dL — ABNORMAL HIGH (ref 70–99)
Glucose-Capillary: 189 mg/dL — ABNORMAL HIGH (ref 70–99)

## 2022-03-19 LAB — MAGNESIUM: Magnesium: 1.6 mg/dL — ABNORMAL LOW (ref 1.7–2.4)

## 2022-03-19 MED ORDER — MAGNESIUM SULFATE 2 GM/50ML IV SOLN
2.0000 g | Freq: Once | INTRAVENOUS | Status: AC
  Administered 2022-03-19: 2 g via INTRAVENOUS
  Filled 2022-03-19: qty 50

## 2022-03-19 MED ORDER — SODIUM CHLORIDE 0.9 % IV SOLN: INTRAVENOUS | Status: AC
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
## 2022-03-20 LAB — CBC WITH DIFFERENTIAL/PLATELET
Abs Immature Granulocytes: 0.04 10*3/uL (ref 0.00–0.07)
Basophils Absolute: 0 10*3/uL (ref 0.0–0.1)
Basophils Relative: 0 %
Eosinophils Absolute: 0.2 10*3/uL (ref 0.0–0.5)
Eosinophils Relative: 2 %
HCT: 35.2 % — ABNORMAL LOW (ref 39.0–52.0)
Hemoglobin: 11.2 g/dL — ABNORMAL LOW (ref 13.0–17.0)
Immature Granulocytes: 0 %
Lymphocytes Relative: 17 %
Lymphs Abs: 1.6 10*3/uL (ref 0.7–4.0)
MCH: 26.8 pg (ref 26.0–34.0)
MCHC: 31.8 g/dL (ref 30.0–36.0)
MCV: 84.2 fL (ref 80.0–100.0)
Monocytes Absolute: 0.9 10*3/uL (ref 0.1–1.0)
Monocytes Relative: 10 %
Neutro Abs: 6.6 10*3/uL (ref 1.7–7.7)
Neutrophils Relative %: 71 %
Platelets: 231 10*3/uL (ref 150–400)
RBC: 4.18 MIL/uL — ABNORMAL LOW (ref 4.22–5.81)
RDW: 15.5 % (ref 11.5–15.5)
WBC: 9.3 10*3/uL (ref 4.0–10.5)
nRBC: 0 % (ref 0.0–0.2)

## 2022-03-20 LAB — BASIC METABOLIC PANEL
Anion gap: 8 (ref 5–15)
BUN: 30 mg/dL — ABNORMAL HIGH (ref 8–23)
CO2: 21 mmol/L — ABNORMAL LOW (ref 22–32)
Calcium: 8.2 mg/dL — ABNORMAL LOW (ref 8.9–10.3)
Chloride: 106 mmol/L (ref 98–111)
Creatinine, Ser: 2.11 mg/dL — ABNORMAL HIGH (ref 0.61–1.24)
GFR, Estimated: 31 mL/min — ABNORMAL LOW (ref >60)
Glucose, Bld: 164 mg/dL — ABNORMAL HIGH (ref 70–99)
Potassium: 4.4 mmol/L (ref 3.5–5.1)
Sodium: 135 mmol/L (ref 135–145)

## 2022-03-20 LAB — MAGNESIUM: Magnesium: 2.2 mg/dL (ref 1.7–2.4)

## 2022-03-20 LAB — GLUCOSE, CAPILLARY
Glucose-Capillary: 158 mg/dL — ABNORMAL HIGH (ref 70–99)
Glucose-Capillary: 85 mg/dL (ref 70–99)

## 2022-03-20 LAB — PHOSPHORUS: Phosphorus: 3.9 mg/dL (ref 2.5–4.6)

## 2022-03-20 MED ORDER — CEPHALEXIN 500 MG PO CAPS
500.0000 mg | ORAL_CAPSULE | Freq: Two times a day (BID) | ORAL | 0 refills | Status: AC
Start: 2022-03-20 — End: 2022-03-30

## 2022-03-20 MED ORDER — METFORMIN HCL 500 MG PO TABS
500.0000 mg | ORAL_TABLET | Freq: Two times a day (BID) | ORAL | 0 refills | Status: DC
Start: 2022-03-20 — End: 2023-11-09

## 2022-03-20 NOTE — Evaluation (Signed)
Occupational Therapy Evaluation Patient Details Name: Luis Carter MRN: [id number] DOB: [birth year] Today's Date: 03/20/2022   History of Present Illness Pt is a 76 y/o male admitted secondary to increased chest pain and SOB. Found to have acute pyelonephritis. PMH includes HTN, DM, COPD, CKD, dCHF, gout and seizures.   Clinical Impression   Pt lives with his sister and typically walks with a cane and functions independently in ADLs. Pt presents with generalized weakness and impaired standing balance. He is agreeable to using his RW until he is stronger and is adamant he is going home. Recommended DME to elevate toilet and for seated showering, pt declined. Pt overall needs up to min assist for mobility and ADLs. Recommending HHOT.     Recommendations for follow up therapy are one component of a multi-disciplinary discharge planning process, led by the attending physician.  Recommendations may be updated based on patient status, additional functional criteria and insurance authorization.   Follow Up Recommendations  Home health OT    Assistance Recommended at Discharge Frequent or constant Supervision/Assistance  Patient can return home with the following A little help with walking and/or transfers;A little help with bathing/dressing/bathroom;Assistance with cooking/housework;Assist for transportation;Help with stairs or ramp for entrance    Functional Status Assessment  Patient has had a recent decline in their functional status and demonstrates the ability to make significant improvements in function in a reasonable and predictable amount of time.  Equipment Recommendations   (pt is declining recommended DME, will defer to [facility])    Recommendations for Other Services       Precautions / Restrictions Precautions Precautions: Fall Restrictions Weight Bearing Restrictions: No      Mobility Bed Mobility Overal bed mobility: Needs Assistance Bed Mobility: Supine to Sit, Sit to  Supine     Supine to sit: Supervision Sit to supine: Supervision        Transfers Overall transfer level: Needs assistance Equipment used: Rolling walker (2 wheels) Transfers: Sit to/from Stand Sit to Stand: Min assist           General transfer comment: Min A for lift assist and steadying.      Balance Overall balance assessment: Needs assistance   Sitting balance-Leahy Scale: Good     Standing balance support: Bilateral upper extremity supported Standing balance-Leahy Scale: Poor Standing balance comment: Reliant on BUE support, but can release walker in static standing at sink.                           ADL either performed or assessed with clinical judgement   ADL Overall ADL's : Needs assistance/impaired Eating/Feeding: Independent;Sitting   Grooming: Wash/dry hands;Standing;Min guard   Upper Body Bathing: Minimal assistance;Sitting   Lower Body Bathing: Minimal assistance;Sit to/from stand   Upper Body Dressing : Set up;Sitting   Lower Body Dressing: Minimal assistance;Sit to/from stand Lower Body Dressing Details (indicate cue type and reason): can perform figure 4 with R LE, not L--bends forward Toilet Transfer: Min guard;Ambulation;Rolling walker (2 wheels) Toilet Transfer Details (indicate cue type and reason): recommended 3 in 1 or toilet riser       Tub/Shower Transfer Details (indicate cue type and reason): recommended tub transfer bench Functional mobility during ADLs: Min guard;Rolling walker (2 wheels);Cueing for safety       Vision Ability to See in Adequate Light: 1 Impaired Patient Visual Report: Blurring of vision Additional Comments: pt reports he has cataracts     Perception  Praxis      Pertinent Vitals/Pain Pain Assessment Pain Assessment: Faces Faces Pain Scale: Hurts a little bit Pain Location: bilateral hips Pain Descriptors / Indicators: Aching, Grimacing Pain Intervention(s): Monitored during session,  Repositioned     Hand Dominance Right   Extremity/Trunk Assessment Upper Extremity Assessment Upper Extremity Assessment: Defer to OT evaluation   Lower Extremity Assessment Lower Extremity Assessment: Generalized weakness (increased hip and knee pain)   Cervical / Trunk Assessment Cervical / Trunk Assessment: Kyphotic   Communication Communication Communication: No difficulties   Cognition Arousal/Alertness: Awake/alert Behavior During Therapy: WFL for tasks assessed/performed Overall Cognitive Status: No family/caregiver present to determine baseline cognitive functioning                                 General Comments: pt with decreased awareness of deficits, denies fall risk     General Comments       Exercises     Shoulder Instructions      Home Living Family/patient expects to be discharged to:: Private residence Living Arrangements: Other relatives (sister) Available Help at Discharge: Family;Available PRN/intermittently Type of Home: House Home Access: Stairs to enter [employer] of Steps: 2 Entrance Stairs-Rails: None Home Layout: Multi-level;Able to live on main level with bedroom/bathroom     Bathroom Shower/Tub: Tub/shower unit;Curtain   Bathroom Toilet: Standard     Home Equipment: Agricultural consultant (2 wheels);Cane - single point          Prior Functioning/Environment Prior Level of Function : Independent/Modified Independent             Mobility Comments: Uses cane primarily for ambulation ADLs Comments: Ind with ADLs/selfcare, IADLs        OT Problem List: Decreased strength;Impaired balance (sitting and/or standing);Decreased activity tolerance;Decreased safety awareness;Decreased cognition;Decreased knowledge of use of DME or AE      OT Treatment/Interventions:      OT Goals(Current goals can be found in the care plan section)    OT Frequency:      Co-evaluation              AM-PAC OT "6  Clicks" Daily Activity     Outcome Measure Help from another person eating meals?: None Help from another person taking care of personal grooming?: A Little Help from another person toileting, which includes using toliet, bedpan, or urinal?: A Little Help from another person bathing (including washing, rinsing, drying)?: A Little Help from another person to put on and taking off regular upper body clothing?: None Help from another person to put on and taking off regular lower body clothing?: A Little 6 Click Score: 20   End of Session Equipment Utilized During Treatment: Rolling walker (2 wheels);Gait belt  Activity Tolerance: Patient tolerated treatment well Patient left: in bed;with call bell/phone within reach;with bed alarm set  OT Visit Diagnosis: Other abnormalities of gait and mobility (R26.89);Muscle weakness (generalized) (M62.81);Other symptoms and signs involving cognitive function                Time: 4098-1191 OT Time Calculation (min): 19 min Charges:  OT General Charges $OT Visit: 1 Visit OT Evaluation $OT Eval Moderate Complexity: 1 Mod  Berna Spare, OTR/L Acute Rehabilitation Services Office: [phone]   Evern Bio 03/20/2022, 11:33 AM

## 2022-03-20 NOTE — Progress Notes (Addendum)
CSW received consult for patient. CSW spoke with patient at bedside. CSW offered patient [facility] Living options for Seniors resources. Patient accepted. All questions answered. No further questions reported at this time.
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
## 2022-03-20 NOTE — Evaluation (Signed)
Physical Therapy Evaluation Patient Details Name: Luis Carter MRN: [id number] DOB: 1946/07/04 Today's Date: 03/20/2022  History of Present Illness  Pt is a 76 y/o male admitted secondary to increased chest pain and SOB. Found to have acute pyelonephritis. PMH includes HTN, DM, COPD, CKD, dCHF, gout and seizures.  Clinical Impression  Pt admitted secondary to problem above with deficits below. Pt requiring min to min guard A for mobility tasks using RW. Mild knee buckling noted and one mild LOB during ambulation. Educated about using RW at home to increase safety. Recommending HHPT at d/c to address current deficits. Will continue to follow acutely.        Recommendations for follow up therapy are one component of a multi-disciplinary discharge planning process, led by the attending physician.  Recommendations may be updated based on patient status, additional functional criteria and insurance authorization.  Follow Up Recommendations Home health PT (pt adamant about returning home)      Assistance Recommended at Discharge Frequent or constant Supervision/Assistance  Patient can return home with the following  A little help with walking and/or transfers;A little help with bathing/dressing/bathroom;Assistance with cooking/housework;Assist for transportation;Help with stairs or ramp for entrance    Equipment Recommendations None recommended by PT  Recommendations for Other Services       Functional Status Assessment Patient has had a recent decline in their functional status and demonstrates the ability to make significant improvements in function in a reasonable and predictable amount of time.     Precautions / Restrictions Precautions Precautions: Fall Restrictions Weight Bearing Restrictions: No      Mobility  Bed Mobility Overal bed mobility: Needs Assistance Bed Mobility: Supine to Sit, Sit to Supine     Supine to sit: Supervision Sit to supine: Supervision    General bed mobility comments: supervision for safety.    Transfers Overall transfer level: Needs assistance Equipment used: Rolling walker (2 wheels) Transfers: Sit to/from Stand Sit to Stand: Min assist           General transfer comment: Min A for lift assist and steadying.    Ambulation/Gait Ambulation/Gait assistance: Min guard, Min assist Gait Distance (Feet): 75 Feet Assistive device: Rolling walker (2 wheels) Gait Pattern/deviations: Trunk flexed, Knees buckling, Decreased stride length Gait velocity: Decreased     General Gait Details: Slow, cautious gait. Knee instability noted bilaterally. One mild LOB requiring Min A. Otherwise requiring min guard. Educated about using RW at home to increase safety.  Stairs            Wheelchair Mobility    Modified Rankin (Stroke Patients Only)       Balance Overall balance assessment: Needs assistance Sitting-balance support: No upper extremity supported, Feet supported Sitting balance-Leahy Scale: Fair     Standing balance support: Bilateral upper extremity supported Standing balance-Leahy Scale: Poor Standing balance comment: Reliant on BUE support                             Pertinent Vitals/Pain Pain Assessment Pain Assessment: Faces Faces Pain Scale: Hurts a little bit Pain Location: bilateral hips Pain Descriptors / Indicators: Aching, Grimacing Pain Intervention(s): Limited activity within patient's tolerance, Monitored during session, Repositioned    Home Living Family/patient expects to be discharged to:: Private residence Living Arrangements: Other relatives (sister) Available Help at Discharge: Family;Available PRN/intermittently Type of Home: House Home Access: Stairs to enter Entrance Stairs-Rails: None Entrance Stairs-Number of Steps: 2   Home  Layout: Multi-level;Able to live on main level with bedroom/bathroom Home Equipment: Rolling Walker (2 wheels);Cane - single point       Prior Function Prior Level of Function : Independent/Modified Independent             Mobility Comments: Uses cane primarily for ambulation ADLs Comments: Ind with ADLs/selfcare, IADLs     Hand Dominance        Extremity/Trunk Assessment   Upper Extremity Assessment Upper Extremity Assessment: Defer to OT evaluation    Lower Extremity Assessment Lower Extremity Assessment: Generalized weakness (increased hip and knee pain)    Cervical / Trunk Assessment Cervical / Trunk Assessment: Kyphotic  Communication   Communication: No difficulties  Cognition Arousal/Alertness: Awake/alert Behavior During Therapy: WFL for tasks assessed/performed Overall Cognitive Status: No family/caregiver present to determine baseline cognitive functioning                                 General Comments: Seemed to be [facility] for basic tasks        General Comments      Exercises     Assessment/Plan    PT Assessment Patient needs continued PT services  PT Problem List Decreased strength;Decreased activity tolerance;Decreased mobility;Decreased balance;Decreased knowledge of use of DME;Decreased knowledge of precautions       PT Treatment Interventions DME instruction;Gait training;Stair training;Functional mobility training;Therapeutic activities;Therapeutic exercise;Balance training;Patient/family education    PT Goals (Current goals can be found in the Care Plan section)  Acute Rehab PT Goals Patient Stated Goal: to go home PT Goal Formulation: With patient Time For Goal Achievement: 04/03/22 Potential to Achieve Goals: Good    Frequency Min 3X/week     Co-evaluation               AM-PAC PT "6 Clicks" Mobility  Outcome Measure Help needed turning from your back to your side while in a flat bed without using bedrails?: None Help needed moving from lying on your back to sitting on the side of a flat bed without using bedrails?: A Little Help needed  moving to and from a bed to a chair (including a wheelchair)?: A Little Help needed standing up from a chair using your arms (e.g., wheelchair or bedside chair)?: A Little Help needed to walk in hospital room?: A Little Help needed climbing 3-5 steps with a railing? : A Lot 6 Click Score: 18    End of Session Equipment Utilized During Treatment: Gait belt Activity Tolerance: Patient tolerated treatment well Patient left: in bed;with call bell/phone within reach Nurse Communication: Mobility status PT Visit Diagnosis: Unsteadiness on feet (R26.81);Muscle weakness (generalized) (M62.81)    Time: 7654-6503 PT Time Calculation (min) (ACUTE ONLY): 15 min   Charges:   PT Evaluation $PT Eval Low Complexity: 1 Low          Farley Ly, PT, DPT  Acute Rehabilitation Services  Office: [phone]   Lehman Prom 03/20/2022, 11:14 AM

## 2022-03-20 NOTE — Consult Note (Signed)
   [facility] CM Inpatient Consult   03/20/2022  Luis Carter February 18, 1946 [id number]  [facility] [[facility]]  Accountable Care Organization [ACO] Patient: Advertising copywriter Medicare  Primary Care Provider:  Fleet Contras, MD, is currently not a listed [facility] provider  *Patient's specialist listed as Atlee Abide, [facility]  Reviewed for high risk score for unplanned readmission risk and access for [facility] Care Management   Reason:     Patient's primary care provider is not an in network provider at this time for eligible care management services.    For additional questions or referrals please contact:    Charlesetta Shanks, RN BSN CCM [facility]  [phone] business mobile phone Toll free office [phone]  Fax number: [phone] [email or web address]

## 2022-03-21 NOTE — TOC Transition Note (Signed)
Transition of Care (TOC) - CM/SW Discharge Note Donn Pierini RN, BSN Transitions of Care Unit 4E- RN Case Manager See Treatment Team for direct phone #  Cross coverage  Patient Details  Name: Luis Carter MRN: [id number] Date of Birth: [birth year]  Transition of Care [facility]) CM/SW Contact:  Darrold Span, RN Phone Number: 03/21/2022, 12:00 PM   Clinical Narrative:    Pt stable for transition home, per previous CM Vickki Muff- pt has been referred out to [facility] for HHPT/OT- referral pending review.   1430- msg sent to [facility] regarding HH referral- referral still pending review  Post Discharge- 7/28- Received call from Angie this AM- pt has been accepted for start of care this weekend- Per Angie [facility] has reached out to pt already regarding HH and start of care.     Final next level of care: Home w Home Health Services Barriers to Discharge: Other (must enter comment) (Awaiting HH acceptance)   Patient Goals and CMS Choice        Discharge Placement                       Discharge Plan and Services In-house Referral: Clinical Social Work Discharge Planning Services: CM Consult Post Acute Care Choice: Home Health          DME Arranged: N/A DME Agency: NA       HH Arranged: PT, OT HH Agency: [facility] Date HH Agency Contacted: 03/20/22 Time HH Agency Contacted: 1400 Representative spoke with at [facility] Agency: Marylene Land  Social Determinants of Health (SDOH) Interventions     Readmission Risk Interventions    09/19/2021    2:31 PM  Readmission Risk Prevention Plan  Transportation Screening Complete  PCP or Specialist Appt within 3-5 Days Complete  HRI or Home Care Consult Complete  Social Work Consult for Recovery Care Planning/Counseling Patient refused  Palliative Care Screening Not Applicable  Medication Review Oceanographer) Complete

## 2022-03-22 DIAGNOSIS — N10 Acute tubulo-interstitial nephritis: Secondary | ICD-10-CM | POA: Diagnosis not present

## 2022-03-22 DIAGNOSIS — R778 Other specified abnormalities of plasma proteins: Secondary | ICD-10-CM | POA: Diagnosis not present

## 2022-03-22 DIAGNOSIS — I5032 Chronic diastolic (congestive) heart failure: Secondary | ICD-10-CM | POA: Diagnosis not present

## 2022-03-22 DIAGNOSIS — M25551 Pain in right hip: Secondary | ICD-10-CM | POA: Diagnosis not present

## 2022-03-22 DIAGNOSIS — I110 Hypertensive heart disease with heart failure: Secondary | ICD-10-CM | POA: Diagnosis not present

## 2022-03-25 DIAGNOSIS — M25551 Pain in right hip: Secondary | ICD-10-CM | POA: Diagnosis not present

## 2022-03-25 DIAGNOSIS — R778 Other specified abnormalities of plasma proteins: Secondary | ICD-10-CM | POA: Diagnosis not present

## 2022-03-25 DIAGNOSIS — I110 Hypertensive heart disease with heart failure: Secondary | ICD-10-CM | POA: Diagnosis not present

## 2022-03-25 DIAGNOSIS — N10 Acute tubulo-interstitial nephritis: Secondary | ICD-10-CM | POA: Diagnosis not present

## 2022-03-25 DIAGNOSIS — I5032 Chronic diastolic (congestive) heart failure: Secondary | ICD-10-CM | POA: Diagnosis not present

## 2022-04-09 DIAGNOSIS — M25551 Pain in right hip: Secondary | ICD-10-CM | POA: Diagnosis not present

## 2022-04-09 DIAGNOSIS — R778 Other specified abnormalities of plasma proteins: Secondary | ICD-10-CM | POA: Diagnosis not present

## 2022-04-09 DIAGNOSIS — I110 Hypertensive heart disease with heart failure: Secondary | ICD-10-CM | POA: Diagnosis not present

## 2022-04-09 DIAGNOSIS — I5032 Chronic diastolic (congestive) heart failure: Secondary | ICD-10-CM | POA: Diagnosis not present

## 2022-04-09 DIAGNOSIS — N10 Acute tubulo-interstitial nephritis: Secondary | ICD-10-CM | POA: Diagnosis not present

## 2022-04-11 DIAGNOSIS — M25551 Pain in right hip: Secondary | ICD-10-CM | POA: Diagnosis not present

## 2022-04-11 DIAGNOSIS — N10 Acute tubulo-interstitial nephritis: Secondary | ICD-10-CM | POA: Diagnosis not present

## 2022-04-11 DIAGNOSIS — I5032 Chronic diastolic (congestive) heart failure: Secondary | ICD-10-CM | POA: Diagnosis not present

## 2022-04-11 DIAGNOSIS — R778 Other specified abnormalities of plasma proteins: Secondary | ICD-10-CM | POA: Diagnosis not present

## 2022-04-11 DIAGNOSIS — I110 Hypertensive heart disease with heart failure: Secondary | ICD-10-CM | POA: Diagnosis not present

## 2022-04-14 DIAGNOSIS — J449 Chronic obstructive pulmonary disease, unspecified: Secondary | ICD-10-CM | POA: Diagnosis not present

## 2022-04-14 DIAGNOSIS — I10 Essential (primary) hypertension: Secondary | ICD-10-CM | POA: Diagnosis not present

## 2022-04-14 DIAGNOSIS — E1142 Type 2 diabetes mellitus with diabetic polyneuropathy: Secondary | ICD-10-CM | POA: Diagnosis not present

## 2022-04-14 DIAGNOSIS — K219 Gastro-esophageal reflux disease without esophagitis: Secondary | ICD-10-CM | POA: Diagnosis not present

## 2022-04-15 DIAGNOSIS — M25551 Pain in right hip: Secondary | ICD-10-CM | POA: Diagnosis not present

## 2022-04-15 DIAGNOSIS — R778 Other specified abnormalities of plasma proteins: Secondary | ICD-10-CM | POA: Diagnosis not present

## 2022-04-15 DIAGNOSIS — N10 Acute tubulo-interstitial nephritis: Secondary | ICD-10-CM | POA: Diagnosis not present

## 2022-04-15 DIAGNOSIS — I110 Hypertensive heart disease with heart failure: Secondary | ICD-10-CM | POA: Diagnosis not present

## 2022-04-15 DIAGNOSIS — I5032 Chronic diastolic (congestive) heart failure: Secondary | ICD-10-CM | POA: Diagnosis not present

## 2022-04-17 DIAGNOSIS — R778 Other specified abnormalities of plasma proteins: Secondary | ICD-10-CM | POA: Diagnosis not present

## 2022-04-17 DIAGNOSIS — N10 Acute tubulo-interstitial nephritis: Secondary | ICD-10-CM | POA: Diagnosis not present

## 2022-04-17 DIAGNOSIS — I5032 Chronic diastolic (congestive) heart failure: Secondary | ICD-10-CM | POA: Diagnosis not present

## 2022-04-17 DIAGNOSIS — I110 Hypertensive heart disease with heart failure: Secondary | ICD-10-CM | POA: Diagnosis not present

## 2022-04-17 DIAGNOSIS — M25551 Pain in right hip: Secondary | ICD-10-CM | POA: Diagnosis not present

## 2022-04-21 DIAGNOSIS — N10 Acute tubulo-interstitial nephritis: Secondary | ICD-10-CM | POA: Diagnosis not present

## 2022-04-21 DIAGNOSIS — M25551 Pain in right hip: Secondary | ICD-10-CM | POA: Diagnosis not present

## 2022-04-21 DIAGNOSIS — I5032 Chronic diastolic (congestive) heart failure: Secondary | ICD-10-CM | POA: Diagnosis not present

## 2022-04-21 DIAGNOSIS — I110 Hypertensive heart disease with heart failure: Secondary | ICD-10-CM | POA: Diagnosis not present

## 2022-04-21 DIAGNOSIS — R778 Other specified abnormalities of plasma proteins: Secondary | ICD-10-CM | POA: Diagnosis not present

## 2022-04-24 DIAGNOSIS — M25551 Pain in right hip: Secondary | ICD-10-CM | POA: Diagnosis not present

## 2022-04-24 DIAGNOSIS — I110 Hypertensive heart disease with heart failure: Secondary | ICD-10-CM | POA: Diagnosis not present

## 2022-04-24 DIAGNOSIS — N10 Acute tubulo-interstitial nephritis: Secondary | ICD-10-CM | POA: Diagnosis not present

## 2022-04-24 DIAGNOSIS — I5032 Chronic diastolic (congestive) heart failure: Secondary | ICD-10-CM | POA: Diagnosis not present

## 2022-04-24 DIAGNOSIS — R778 Other specified abnormalities of plasma proteins: Secondary | ICD-10-CM | POA: Diagnosis not present

## 2022-05-02 DIAGNOSIS — I110 Hypertensive heart disease with heart failure: Secondary | ICD-10-CM | POA: Diagnosis not present

## 2022-05-02 DIAGNOSIS — R778 Other specified abnormalities of plasma proteins: Secondary | ICD-10-CM | POA: Diagnosis not present

## 2022-05-02 DIAGNOSIS — I5032 Chronic diastolic (congestive) heart failure: Secondary | ICD-10-CM | POA: Diagnosis not present

## 2022-05-02 DIAGNOSIS — N10 Acute tubulo-interstitial nephritis: Secondary | ICD-10-CM | POA: Diagnosis not present

## 2022-05-02 DIAGNOSIS — M25551 Pain in right hip: Secondary | ICD-10-CM | POA: Diagnosis not present

## 2022-05-07 DIAGNOSIS — N10 Acute tubulo-interstitial nephritis: Secondary | ICD-10-CM | POA: Diagnosis not present

## 2022-05-07 DIAGNOSIS — R778 Other specified abnormalities of plasma proteins: Secondary | ICD-10-CM | POA: Diagnosis not present

## 2022-05-07 DIAGNOSIS — I110 Hypertensive heart disease with heart failure: Secondary | ICD-10-CM | POA: Diagnosis not present

## 2022-05-07 DIAGNOSIS — I5032 Chronic diastolic (congestive) heart failure: Secondary | ICD-10-CM | POA: Diagnosis not present

## 2022-05-07 DIAGNOSIS — M25551 Pain in right hip: Secondary | ICD-10-CM | POA: Diagnosis not present

## 2022-05-08 DIAGNOSIS — R778 Other specified abnormalities of plasma proteins: Secondary | ICD-10-CM | POA: Diagnosis not present

## 2022-05-08 DIAGNOSIS — I110 Hypertensive heart disease with heart failure: Secondary | ICD-10-CM | POA: Diagnosis not present

## 2022-05-08 DIAGNOSIS — I5032 Chronic diastolic (congestive) heart failure: Secondary | ICD-10-CM | POA: Diagnosis not present

## 2022-05-08 DIAGNOSIS — N10 Acute tubulo-interstitial nephritis: Secondary | ICD-10-CM | POA: Diagnosis not present

## 2022-05-08 DIAGNOSIS — M25551 Pain in right hip: Secondary | ICD-10-CM | POA: Diagnosis not present

## 2022-05-09 DIAGNOSIS — I5032 Chronic diastolic (congestive) heart failure: Secondary | ICD-10-CM | POA: Diagnosis not present

## 2022-05-09 DIAGNOSIS — M25551 Pain in right hip: Secondary | ICD-10-CM | POA: Diagnosis not present

## 2022-05-09 DIAGNOSIS — Z23 Encounter for immunization: Secondary | ICD-10-CM | POA: Diagnosis not present

## 2022-05-09 DIAGNOSIS — I110 Hypertensive heart disease with heart failure: Secondary | ICD-10-CM | POA: Diagnosis not present

## 2022-05-09 DIAGNOSIS — N10 Acute tubulo-interstitial nephritis: Secondary | ICD-10-CM | POA: Diagnosis not present

## 2022-05-09 DIAGNOSIS — R778 Other specified abnormalities of plasma proteins: Secondary | ICD-10-CM | POA: Diagnosis not present

## 2022-05-16 DIAGNOSIS — R778 Other specified abnormalities of plasma proteins: Secondary | ICD-10-CM | POA: Diagnosis not present

## 2022-05-16 DIAGNOSIS — N10 Acute tubulo-interstitial nephritis: Secondary | ICD-10-CM | POA: Diagnosis not present

## 2022-05-16 DIAGNOSIS — I5032 Chronic diastolic (congestive) heart failure: Secondary | ICD-10-CM | POA: Diagnosis not present

## 2022-05-16 DIAGNOSIS — M25551 Pain in right hip: Secondary | ICD-10-CM | POA: Diagnosis not present

## 2022-05-16 DIAGNOSIS — I110 Hypertensive heart disease with heart failure: Secondary | ICD-10-CM | POA: Diagnosis not present

## 2022-05-21 ENCOUNTER — Ambulatory Visit: Payer: Medicare Other | Admitting: Podiatry

## 2022-05-23 DIAGNOSIS — M179 Osteoarthritis of knee, unspecified: Secondary | ICD-10-CM | POA: Diagnosis not present

## 2022-05-23 DIAGNOSIS — I10 Essential (primary) hypertension: Secondary | ICD-10-CM | POA: Diagnosis not present

## 2022-05-23 DIAGNOSIS — E1142 Type 2 diabetes mellitus with diabetic polyneuropathy: Secondary | ICD-10-CM | POA: Diagnosis not present

## 2022-05-23 DIAGNOSIS — J449 Chronic obstructive pulmonary disease, unspecified: Secondary | ICD-10-CM | POA: Diagnosis not present

## 2022-05-27 DIAGNOSIS — J449 Chronic obstructive pulmonary disease, unspecified: Secondary | ICD-10-CM | POA: Diagnosis not present

## 2022-05-27 DIAGNOSIS — J440 Chronic obstructive pulmonary disease with acute lower respiratory infection: Secondary | ICD-10-CM | POA: Diagnosis not present

## 2022-06-10 DIAGNOSIS — J449 Chronic obstructive pulmonary disease, unspecified: Secondary | ICD-10-CM | POA: Diagnosis not present

## 2022-06-12 DIAGNOSIS — J449 Chronic obstructive pulmonary disease, unspecified: Secondary | ICD-10-CM | POA: Diagnosis not present

## 2022-06-12 DIAGNOSIS — E1142 Type 2 diabetes mellitus with diabetic polyneuropathy: Secondary | ICD-10-CM | POA: Diagnosis not present

## 2022-06-12 DIAGNOSIS — M179 Osteoarthritis of knee, unspecified: Secondary | ICD-10-CM | POA: Diagnosis not present

## 2022-06-12 DIAGNOSIS — I10 Essential (primary) hypertension: Secondary | ICD-10-CM | POA: Diagnosis not present

## 2022-06-27 DIAGNOSIS — J440 Chronic obstructive pulmonary disease with acute lower respiratory infection: Secondary | ICD-10-CM | POA: Diagnosis not present

## 2022-07-14 DIAGNOSIS — J449 Chronic obstructive pulmonary disease, unspecified: Secondary | ICD-10-CM | POA: Diagnosis not present

## 2022-07-27 DIAGNOSIS — J440 Chronic obstructive pulmonary disease with acute lower respiratory infection: Secondary | ICD-10-CM | POA: Diagnosis not present

## 2022-08-05 DIAGNOSIS — J449 Chronic obstructive pulmonary disease, unspecified: Secondary | ICD-10-CM | POA: Diagnosis not present

## 2022-08-27 DIAGNOSIS — J440 Chronic obstructive pulmonary disease with acute lower respiratory infection: Secondary | ICD-10-CM | POA: Diagnosis not present

## 2022-09-11 ENCOUNTER — Other Ambulatory Visit: Payer: Self-pay | Admitting: Internal Medicine

## 2022-09-11 DIAGNOSIS — E7849 Other hyperlipidemia: Secondary | ICD-10-CM | POA: Diagnosis not present

## 2022-09-11 DIAGNOSIS — I10 Essential (primary) hypertension: Secondary | ICD-10-CM | POA: Diagnosis not present

## 2022-09-11 DIAGNOSIS — M1A9XX0 Chronic gout, unspecified, without tophus (tophi): Secondary | ICD-10-CM | POA: Diagnosis not present

## 2022-09-11 DIAGNOSIS — J302 Other seasonal allergic rhinitis: Secondary | ICD-10-CM | POA: Diagnosis not present

## 2022-09-11 DIAGNOSIS — E1142 Type 2 diabetes mellitus with diabetic polyneuropathy: Secondary | ICD-10-CM | POA: Diagnosis not present

## 2022-09-11 DIAGNOSIS — Z0000 Encounter for general adult medical examination without abnormal findings: Secondary | ICD-10-CM | POA: Diagnosis not present

## 2022-09-11 DIAGNOSIS — J449 Chronic obstructive pulmonary disease, unspecified: Secondary | ICD-10-CM | POA: Diagnosis not present

## 2022-09-11 DIAGNOSIS — N3281 Overactive bladder: Secondary | ICD-10-CM | POA: Diagnosis not present

## 2022-09-12 LAB — COMPLETE METABOLIC PANEL WITH GFR
AG Ratio: 1.2 (calc) (ref 1.0–2.5)
ALT: 17 U/L (ref 9–46)
AST: 34 U/L (ref 10–35)
Albumin: 3.7 g/dL (ref 3.6–5.1)
Alkaline phosphatase (APISO): 105 U/L (ref 35–144)
BUN/Creatinine Ratio: 12 (calc) (ref 6–22)
BUN: 17 mg/dL (ref 7–25)
CO2: 26 mmol/L (ref 20–32)
Calcium: 8.9 mg/dL (ref 8.6–10.3)
Chloride: 106 mmol/L (ref 98–110)
Creat: 1.38 mg/dL — ABNORMAL HIGH (ref 0.70–1.28)
Globulin: 3.1 g/dL (calc) (ref 1.9–3.7)
Glucose, Bld: 78 mg/dL (ref 65–99)
Potassium: 4.7 mmol/L (ref 3.5–5.3)
Sodium: 142 mmol/L (ref 135–146)
Total Bilirubin: 0.7 mg/dL (ref 0.2–1.2)
Total Protein: 6.8 g/dL (ref 6.1–8.1)
eGFR: 52 mL/min/{1.73_m2} — ABNORMAL LOW (ref >=60)

## 2022-09-12 LAB — CBC
HCT: 38.5 % (ref 38.5–50.0)
Hemoglobin: 12.3 g/dL — ABNORMAL LOW (ref 13.2–17.1)
MCH: 27.4 pg (ref 27.0–33.0)
MCHC: 31.9 g/dL — ABNORMAL LOW (ref 32.0–36.0)
MCV: 85.7 fL (ref 80.0–100.0)
MPV: 11.6 fL (ref 7.5–12.5)
Platelets: 223 10*3/uL (ref 140–400)
RBC: 4.49 10*6/uL (ref 4.20–5.80)
RDW: 14.4 % (ref 11.0–15.0)
WBC: 7.3 10*3/uL (ref 3.8–10.8)

## 2022-09-12 LAB — TSH: TSH: 4.21 mIU/L (ref 0.40–4.50)

## 2022-09-12 LAB — LIPID PANEL
Cholesterol: 101 mg/dL (ref <200)
HDL: 43 mg/dL (ref >=40)
LDL Cholesterol (Calc): 41 mg/dL (calc)
Non-HDL Cholesterol (Calc): 58 mg/dL (calc) (ref <130)
Total CHOL/HDL Ratio: 2.3 (calc) (ref <5.0)
Triglycerides: 83 mg/dL (ref <150)

## 2022-09-12 LAB — URIC ACID: Uric Acid, Serum: 3.9 mg/dL — ABNORMAL LOW (ref 4.0–8.0)

## 2022-09-12 LAB — HEMOGLOBIN A1C W/OUT EAG: Hgb A1c MFr Bld: 6.7 % of total Hgb — ABNORMAL HIGH (ref <5.7)

## 2022-09-27 DIAGNOSIS — J440 Chronic obstructive pulmonary disease with acute lower respiratory infection: Secondary | ICD-10-CM | POA: Diagnosis not present

## 2022-10-10 DIAGNOSIS — J449 Chronic obstructive pulmonary disease, unspecified: Secondary | ICD-10-CM | POA: Diagnosis not present

## 2022-10-21 DIAGNOSIS — E1142 Type 2 diabetes mellitus with diabetic polyneuropathy: Secondary | ICD-10-CM | POA: Diagnosis not present

## 2022-10-21 DIAGNOSIS — J449 Chronic obstructive pulmonary disease, unspecified: Secondary | ICD-10-CM | POA: Diagnosis not present

## 2022-10-21 DIAGNOSIS — I10 Essential (primary) hypertension: Secondary | ICD-10-CM | POA: Diagnosis not present

## 2022-10-21 DIAGNOSIS — M179 Osteoarthritis of knee, unspecified: Secondary | ICD-10-CM | POA: Diagnosis not present

## 2022-10-26 DIAGNOSIS — J440 Chronic obstructive pulmonary disease with acute lower respiratory infection: Secondary | ICD-10-CM | POA: Diagnosis not present

## 2022-11-26 DIAGNOSIS — J440 Chronic obstructive pulmonary disease with acute lower respiratory infection: Secondary | ICD-10-CM | POA: Diagnosis not present

## 2022-12-11 DIAGNOSIS — J449 Chronic obstructive pulmonary disease, unspecified: Secondary | ICD-10-CM | POA: Diagnosis not present

## 2022-12-15 DIAGNOSIS — J449 Chronic obstructive pulmonary disease, unspecified: Secondary | ICD-10-CM | POA: Diagnosis not present

## 2022-12-26 DIAGNOSIS — J440 Chronic obstructive pulmonary disease with acute lower respiratory infection: Secondary | ICD-10-CM | POA: Diagnosis not present

## 2023-01-01 DIAGNOSIS — J449 Chronic obstructive pulmonary disease, unspecified: Secondary | ICD-10-CM | POA: Diagnosis not present

## 2023-01-03 ENCOUNTER — Other Ambulatory Visit: Payer: Self-pay

## 2023-01-03 ENCOUNTER — Ambulatory Visit (INDEPENDENT_AMBULATORY_CARE_PROVIDER_SITE_OTHER): Payer: 59

## 2023-01-03 ENCOUNTER — Inpatient Hospital Stay (HOSPITAL_COMMUNITY)
Admission: EM | Admit: 2023-01-03 | Discharge: 2023-01-09 | DRG: 291 | Disposition: A | Discharge status: Home Health | Payer: 59 | Attending: Internal Medicine | Admitting: Internal Medicine

## 2023-01-03 ENCOUNTER — Encounter (HOSPITAL_COMMUNITY): Payer: Self-pay | Admitting: Pharmacy Technician

## 2023-01-03 ENCOUNTER — Emergency Department (HOSPITAL_COMMUNITY): Payer: 59

## 2023-01-03 ENCOUNTER — Ambulatory Visit (INDEPENDENT_AMBULATORY_CARE_PROVIDER_SITE_OTHER)
Admission: EM | Admit: 2023-01-03 | Discharge: 2023-01-03 | Disposition: A | Discharge status: Home / Self Care | Payer: 59 | Source: Home / Self Care

## 2023-01-03 ENCOUNTER — Encounter (HOSPITAL_COMMUNITY): Payer: Self-pay | Admitting: Emergency Medicine

## 2023-01-03 DIAGNOSIS — N179 Acute kidney failure, unspecified: Secondary | ICD-10-CM | POA: Diagnosis present

## 2023-01-03 DIAGNOSIS — J441 Chronic obstructive pulmonary disease with (acute) exacerbation: Secondary | ICD-10-CM | POA: Diagnosis present

## 2023-01-03 DIAGNOSIS — E876 Hypokalemia: Secondary | ICD-10-CM | POA: Diagnosis not present

## 2023-01-03 DIAGNOSIS — Z8249 Family history of ischemic heart disease and other diseases of the circulatory system: Secondary | ICD-10-CM

## 2023-01-03 DIAGNOSIS — E1165 Type 2 diabetes mellitus with hyperglycemia: Secondary | ICD-10-CM | POA: Diagnosis present

## 2023-01-03 DIAGNOSIS — M109 Gout, unspecified: Secondary | ICD-10-CM | POA: Diagnosis present

## 2023-01-03 DIAGNOSIS — Z87891 Personal history of nicotine dependence: Secondary | ICD-10-CM

## 2023-01-03 DIAGNOSIS — R0602 Shortness of breath: Secondary | ICD-10-CM | POA: Insufficient documentation

## 2023-01-03 DIAGNOSIS — E0821 Diabetes mellitus due to underlying condition with diabetic nephropathy: Secondary | ICD-10-CM

## 2023-01-03 DIAGNOSIS — I130 Hypertensive heart and chronic kidney disease with heart failure and stage 1 through stage 4 chronic kidney disease, or unspecified chronic kidney disease: Secondary | ICD-10-CM | POA: Diagnosis not present

## 2023-01-03 DIAGNOSIS — T380X5A Adverse effect of glucocorticoids and synthetic analogues, initial encounter: Secondary | ICD-10-CM | POA: Diagnosis present

## 2023-01-03 DIAGNOSIS — R569 Unspecified convulsions: Secondary | ICD-10-CM | POA: Diagnosis not present

## 2023-01-03 DIAGNOSIS — I6502 Occlusion and stenosis of left vertebral artery: Secondary | ICD-10-CM | POA: Diagnosis not present

## 2023-01-03 DIAGNOSIS — E1169 Type 2 diabetes mellitus with other specified complication: Secondary | ICD-10-CM | POA: Diagnosis present

## 2023-01-03 DIAGNOSIS — E1122 Type 2 diabetes mellitus with diabetic chronic kidney disease: Secondary | ICD-10-CM | POA: Diagnosis not present

## 2023-01-03 DIAGNOSIS — I5033 Acute on chronic diastolic (congestive) heart failure: Secondary | ICD-10-CM | POA: Diagnosis present

## 2023-01-03 DIAGNOSIS — I2510 Atherosclerotic heart disease of native coronary artery without angina pectoris: Secondary | ICD-10-CM | POA: Diagnosis not present

## 2023-01-03 DIAGNOSIS — M542 Cervicalgia: Secondary | ICD-10-CM | POA: Diagnosis not present

## 2023-01-03 DIAGNOSIS — I6522 Occlusion and stenosis of left carotid artery: Secondary | ICD-10-CM | POA: Diagnosis present

## 2023-01-03 DIAGNOSIS — I429 Cardiomyopathy, unspecified: Secondary | ICD-10-CM | POA: Diagnosis present

## 2023-01-03 DIAGNOSIS — J9601 Acute respiratory failure with hypoxia: Secondary | ICD-10-CM

## 2023-01-03 DIAGNOSIS — E785 Hyperlipidemia, unspecified: Secondary | ICD-10-CM | POA: Diagnosis present

## 2023-01-03 DIAGNOSIS — I252 Old myocardial infarction: Secondary | ICD-10-CM

## 2023-01-03 DIAGNOSIS — M1712 Unilateral primary osteoarthritis, left knee: Secondary | ICD-10-CM | POA: Diagnosis present

## 2023-01-03 DIAGNOSIS — M5440 Lumbago with sciatica, unspecified side: Secondary | ICD-10-CM | POA: Diagnosis not present

## 2023-01-03 DIAGNOSIS — K219 Gastro-esophageal reflux disease without esophagitis: Secondary | ICD-10-CM | POA: Diagnosis present

## 2023-01-03 DIAGNOSIS — Z7951 Long term (current) use of inhaled steroids: Secondary | ICD-10-CM

## 2023-01-03 DIAGNOSIS — M1000 Idiopathic gout, unspecified site: Secondary | ICD-10-CM | POA: Diagnosis not present

## 2023-01-03 DIAGNOSIS — Z79899 Other long term (current) drug therapy: Secondary | ICD-10-CM

## 2023-01-03 DIAGNOSIS — G40909 Epilepsy, unspecified, not intractable, without status epilepticus: Secondary | ICD-10-CM | POA: Diagnosis present

## 2023-01-03 DIAGNOSIS — N1832 Chronic kidney disease, stage 3b: Secondary | ICD-10-CM | POA: Diagnosis not present

## 2023-01-03 DIAGNOSIS — G8929 Other chronic pain: Secondary | ICD-10-CM | POA: Diagnosis present

## 2023-01-03 DIAGNOSIS — E7800 Pure hypercholesterolemia, unspecified: Secondary | ICD-10-CM | POA: Diagnosis not present

## 2023-01-03 DIAGNOSIS — I10 Essential (primary) hypertension: Secondary | ICD-10-CM | POA: Diagnosis not present

## 2023-01-03 DIAGNOSIS — N390 Urinary tract infection, site not specified: Secondary | ICD-10-CM | POA: Diagnosis present

## 2023-01-03 DIAGNOSIS — Z955 Presence of coronary angioplasty implant and graft: Secondary | ICD-10-CM

## 2023-01-03 DIAGNOSIS — I5023 Acute on chronic systolic (congestive) heart failure: Principal | ICD-10-CM

## 2023-01-03 DIAGNOSIS — I110 Hypertensive heart disease with heart failure: Secondary | ICD-10-CM | POA: Diagnosis not present

## 2023-01-03 DIAGNOSIS — Z7984 Long term (current) use of oral hypoglycemic drugs: Secondary | ICD-10-CM

## 2023-01-03 DIAGNOSIS — B952 Enterococcus as the cause of diseases classified elsewhere: Secondary | ICD-10-CM | POA: Diagnosis present

## 2023-01-03 DIAGNOSIS — R079 Chest pain, unspecified: Secondary | ICD-10-CM | POA: Diagnosis not present

## 2023-01-03 DIAGNOSIS — N3000 Acute cystitis without hematuria: Secondary | ICD-10-CM | POA: Diagnosis not present

## 2023-01-03 DIAGNOSIS — E1129 Type 2 diabetes mellitus with other diabetic kidney complication: Secondary | ICD-10-CM | POA: Diagnosis present

## 2023-01-03 DIAGNOSIS — Z7982 Long term (current) use of aspirin: Secondary | ICD-10-CM

## 2023-01-03 DIAGNOSIS — Z1152 Encounter for screening for COVID-19: Secondary | ICD-10-CM | POA: Diagnosis not present

## 2023-01-03 DIAGNOSIS — J449 Chronic obstructive pulmonary disease, unspecified: Secondary | ICD-10-CM | POA: Diagnosis not present

## 2023-01-03 DIAGNOSIS — M545 Low back pain, unspecified: Secondary | ICD-10-CM | POA: Diagnosis not present

## 2023-01-03 DIAGNOSIS — I6523 Occlusion and stenosis of bilateral carotid arteries: Secondary | ICD-10-CM | POA: Diagnosis not present

## 2023-01-03 LAB — CBC WITH DIFFERENTIAL/PLATELET
Abs Immature Granulocytes: 0.03 10*3/uL (ref 0.00–0.07)
Basophils Absolute: 0 10*3/uL (ref 0.0–0.1)
Basophils Relative: 0 %
Eosinophils Absolute: 0.2 10*3/uL (ref 0.0–0.5)
Eosinophils Relative: 1 %
HCT: 36.6 % — ABNORMAL LOW (ref 39.0–52.0)
Hemoglobin: 11.4 g/dL — ABNORMAL LOW (ref 13.0–17.0)
Immature Granulocytes: 0 %
Lymphocytes Relative: 18 %
Lymphs Abs: 2 10*3/uL (ref 0.7–4.0)
MCH: 26.4 pg (ref 26.0–34.0)
MCHC: 31.1 g/dL (ref 30.0–36.0)
MCV: 84.7 fL (ref 80.0–100.0)
Monocytes Absolute: 1.6 10*3/uL — ABNORMAL HIGH (ref 0.1–1.0)
Monocytes Relative: 14 %
Neutro Abs: 7.8 10*3/uL — ABNORMAL HIGH (ref 1.7–7.7)
Neutrophils Relative %: 67 %
Platelets: 227 10*3/uL (ref 150–400)
RBC: 4.32 MIL/uL (ref 4.22–5.81)
RDW: 15.2 % (ref 11.5–15.5)
WBC: 11.6 10*3/uL — ABNORMAL HIGH (ref 4.0–10.5)
nRBC: 0 % (ref 0.0–0.2)

## 2023-01-03 LAB — POCT URINALYSIS DIP (MANUAL ENTRY)
Bilirubin, UA: NEGATIVE
Glucose, UA: NEGATIVE mg/dL
Nitrite, UA: NEGATIVE
Protein Ur, POC: 100 mg/dL — AB
Spec Grav, UA: 1.02 (ref 1.010–1.025)
Urobilinogen, UA: 1 E.U./dL
pH, UA: 7 (ref 5.0–8.0)

## 2023-01-03 LAB — I-STAT CHEM 8, ED
BUN: 21 mg/dL (ref 8–23)
Calcium, Ion: 0.95 mmol/L — ABNORMAL LOW (ref 1.15–1.40)
Chloride: 107 mmol/L (ref 98–111)
Creatinine, Ser: 1.6 mg/dL — ABNORMAL HIGH (ref 0.61–1.24)
Glucose, Bld: 88 mg/dL (ref 70–99)
HCT: 37 % — ABNORMAL LOW (ref 39.0–52.0)
Hemoglobin: 12.6 g/dL — ABNORMAL LOW (ref 13.0–17.0)
Potassium: 3.6 mmol/L (ref 3.5–5.1)
Sodium: 139 mmol/L (ref 135–145)
TCO2: 20 mmol/L — ABNORMAL LOW (ref 22–32)

## 2023-01-03 LAB — URINALYSIS, ROUTINE W REFLEX MICROSCOPIC
Bilirubin Urine: NEGATIVE
Glucose, UA: NEGATIVE mg/dL
Ketones, ur: 5 mg/dL — AB
Nitrite: NEGATIVE
Protein, ur: 100 mg/dL — AB
Specific Gravity, Urine: 1.015 (ref 1.005–1.030)
pH: 6 (ref 5.0–8.0)

## 2023-01-03 LAB — SARS CORONAVIRUS 2 BY RT PCR: SARS Coronavirus 2 by RT PCR: NEGATIVE

## 2023-01-03 LAB — COMPREHENSIVE METABOLIC PANEL
ALT: 13 U/L (ref 0–44)
AST: 29 U/L (ref 15–41)
Albumin: 3.4 g/dL — ABNORMAL LOW (ref 3.5–5.0)
Alkaline Phosphatase: 84 U/L (ref 38–126)
Anion gap: 12 (ref 5–15)
BUN: 22 mg/dL (ref 8–23)
CO2: 20 mmol/L — ABNORMAL LOW (ref 22–32)
Calcium: 8.3 mg/dL — ABNORMAL LOW (ref 8.9–10.3)
Chloride: 104 mmol/L (ref 98–111)
Creatinine, Ser: 1.59 mg/dL — ABNORMAL HIGH (ref 0.61–1.24)
GFR, Estimated: 44 mL/min — ABNORMAL LOW (ref >60)
Glucose, Bld: 89 mg/dL (ref 70–99)
Potassium: 3.7 mmol/L (ref 3.5–5.1)
Sodium: 136 mmol/L (ref 135–145)
Total Bilirubin: 1.3 mg/dL — ABNORMAL HIGH (ref 0.3–1.2)
Total Protein: 7.1 g/dL (ref 6.5–8.1)

## 2023-01-03 LAB — LACTIC ACID, PLASMA
Lactic Acid, Venous: 1.6 mmol/L (ref 0.5–1.9)
Lactic Acid, Venous: 1 mmol/L (ref 0.5–1.9)

## 2023-01-03 LAB — BRAIN NATRIURETIC PEPTIDE: B Natriuretic Peptide: 381.4 pg/mL — ABNORMAL HIGH (ref 0.0–100.0)

## 2023-01-03 LAB — MAGNESIUM: Magnesium: 1.5 mg/dL — ABNORMAL LOW (ref 1.7–2.4)

## 2023-01-03 MED ORDER — AMLODIPINE BESYLATE 5 MG PO TABS: 5.0000 mg | ORAL_TABLET | Freq: Every day | ORAL | Status: DC

## 2023-01-03 MED ORDER — IOHEXOL 350 MG/ML SOLN
60.0000 mL | Freq: Once | INTRAVENOUS | Status: AC | PRN
  Administered 2023-01-03: 60 mL via INTRAVENOUS

## 2023-01-03 MED ORDER — ACETAMINOPHEN 325 MG PO TABS
650.0000 mg | ORAL_TABLET | Freq: Four times a day (QID) | ORAL | Status: DC | PRN
  Administered 2023-01-04 – 2023-01-09 (×5): 650 mg via ORAL
  Filled 2023-01-03 (×5): qty 2

## 2023-01-03 MED ORDER — SODIUM CHLORIDE 0.9% FLUSH
3.0000 mL | Freq: Two times a day (BID) | INTRAVENOUS | Status: DC
  Administered 2023-01-03 – 2023-01-09 (×12): 3 mL via INTRAVENOUS

## 2023-01-03 MED ORDER — ASPIRIN 81 MG PO TBEC
81.0000 mg | DELAYED_RELEASE_TABLET | Freq: Every day | ORAL | Status: DC
  Administered 2023-01-04 – 2023-01-09 (×6): 81 mg via ORAL
  Filled 2023-01-03 (×6): qty 1

## 2023-01-03 MED ORDER — MORPHINE SULFATE (PF) 2 MG/ML IV SOLN
2.0000 mg | Freq: Once | INTRAVENOUS | Status: AC
  Administered 2023-01-03: 2 mg via INTRAVENOUS
  Filled 2023-01-03: qty 1

## 2023-01-03 MED ORDER — POLYETHYLENE GLYCOL 3350 17 G PO PACK: 17.0000 g | PACK | Freq: Every day | ORAL | Status: DC | PRN

## 2023-01-03 MED ORDER — MOMETASONE FURO-FORMOTEROL FUM 200-5 MCG/ACT IN AERO
2.0000 | INHALATION_SPRAY | Freq: Two times a day (BID) | RESPIRATORY_TRACT | Status: DC
  Administered 2023-01-04 – 2023-01-09 (×11): 2 via RESPIRATORY_TRACT
  Filled 2023-01-03 (×3): qty 8.8

## 2023-01-03 MED ORDER — ALLOPURINOL 100 MG PO TABS
200.0000 mg | ORAL_TABLET | Freq: Every day | ORAL | Status: DC
  Administered 2023-01-04 – 2023-01-09 (×6): 200 mg via ORAL
  Filled 2023-01-03 (×6): qty 2

## 2023-01-03 MED ORDER — ONDANSETRON HCL 4 MG/2ML IJ SOLN
4.0000 mg | Freq: Once | INTRAMUSCULAR | Status: AC
  Administered 2023-01-03: 4 mg via INTRAVENOUS
  Filled 2023-01-03: qty 2

## 2023-01-03 MED ORDER — FUROSEMIDE 10 MG/ML IJ SOLN
40.0000 mg | Freq: Once | INTRAMUSCULAR | Status: AC
  Administered 2023-01-03: 40 mg via INTRAVENOUS
  Filled 2023-01-03: qty 4

## 2023-01-03 MED ORDER — ACETAMINOPHEN 650 MG RE SUPP: 650.0000 mg | Freq: Four times a day (QID) | RECTAL | Status: DC | PRN

## 2023-01-03 MED ORDER — METHOCARBAMOL 500 MG PO TABS: 750.0000 mg | ORAL_TABLET | Freq: Three times a day (TID) | ORAL | Status: DC | PRN

## 2023-01-03 MED ORDER — METHOCARBAMOL 500 MG PO TABS
750.0000 mg | ORAL_TABLET | Freq: Once | ORAL | Status: AC
  Administered 2023-01-03: 750 mg via ORAL
  Filled 2023-01-03: qty 2

## 2023-01-03 MED ORDER — INSULIN ASPART 100 UNIT/ML IJ SOLN
0.0000 [IU] | Freq: Three times a day (TID) | INTRAMUSCULAR | Status: DC
  Administered 2023-01-04: 2 [IU] via SUBCUTANEOUS
  Administered 2023-01-05: 3 [IU] via SUBCUTANEOUS
  Administered 2023-01-05 (×2): 1 [IU] via SUBCUTANEOUS
  Administered 2023-01-06 (×2): 2 [IU] via SUBCUTANEOUS
  Administered 2023-01-06: 3 [IU] via SUBCUTANEOUS
  Administered 2023-01-07: 2 [IU] via SUBCUTANEOUS
  Administered 2023-01-07 – 2023-01-08 (×5): 3 [IU] via SUBCUTANEOUS
  Administered 2023-01-09: 1 [IU] via SUBCUTANEOUS
  Administered 2023-01-09: 2 [IU] via SUBCUTANEOUS

## 2023-01-03 MED ORDER — IPRATROPIUM-ALBUTEROL 0.5-2.5 (3) MG/3ML IN SOLN
3.0000 mL | Freq: Once | RESPIRATORY_TRACT | Status: AC
  Administered 2023-01-03: 3 mL via RESPIRATORY_TRACT
  Filled 2023-01-03: qty 3

## 2023-01-03 MED ORDER — ATORVASTATIN CALCIUM 10 MG PO TABS
20.0000 mg | ORAL_TABLET | Freq: Every day | ORAL | Status: DC
  Administered 2023-01-04 – 2023-01-09 (×6): 20 mg via ORAL
  Filled 2023-01-03 (×6): qty 2

## 2023-01-03 MED ORDER — GABAPENTIN 300 MG PO CAPS
300.0000 mg | ORAL_CAPSULE | Freq: Three times a day (TID) | ORAL | Status: DC
  Administered 2023-01-03 – 2023-01-05 (×5): 300 mg via ORAL
  Filled 2023-01-03 (×5): qty 1

## 2023-01-03 MED ORDER — FUROSEMIDE 10 MG/ML IJ SOLN
40.0000 mg | Freq: Two times a day (BID) | INTRAMUSCULAR | Status: DC
  Administered 2023-01-04 – 2023-01-05 (×3): 40 mg via INTRAVENOUS
  Filled 2023-01-03 (×3): qty 4

## 2023-01-03 MED ORDER — ALBUTEROL SULFATE (2.5 MG/3ML) 0.083% IN NEBU: 3.0000 mL | INHALATION_SOLUTION | Freq: Four times a day (QID) | RESPIRATORY_TRACT | Status: DC | PRN

## 2023-01-03 MED ORDER — ENOXAPARIN SODIUM 40 MG/0.4ML IJ SOSY
40.0000 mg | PREFILLED_SYRINGE | INTRAMUSCULAR | Status: DC
  Administered 2023-01-03 – 2023-01-04 (×2): 40 mg via SUBCUTANEOUS
  Filled 2023-01-03 (×2): qty 0.4

## 2023-01-03 MED ORDER — PANTOPRAZOLE SODIUM 40 MG PO TBEC
40.0000 mg | DELAYED_RELEASE_TABLET | Freq: Every day | ORAL | Status: DC
  Administered 2023-01-04 – 2023-01-09 (×6): 40 mg via ORAL
  Filled 2023-01-03 (×6): qty 1

## 2023-01-03 NOTE — ED Provider Notes (Signed)
[facility]    CSN: [id number] Arrival date & time: 01/03/23  1237      History   Chief Complaint Chief Complaint  Patient presents with   Shortness of Breath   Neck Pain    HPI Luis Carter is a 77 y.o. male.   Patient presents to clinic for shortness of breath, hot and cold chills and feeling unwell for the past 3 days.  Denies dysuria or flank pain.  Reports he has had fevers.  Denies lower extremity edema.  He does have a history of heart failure, COPD, asthma and pyelonephritis.  Oxygenation is fluctuating in room, does appear hypoxic.  Placed on 3 L nasal cannula.  The history is provided by the patient, the spouse and medical records.  Shortness of Breath Associated symptoms: fever and neck pain   Associated symptoms: no abdominal pain, no chest pain, no cough, no sore throat and no wheezing   Neck Pain Associated symptoms: fever   Associated symptoms: no chest pain     Past Medical History:  Diagnosis Date   Arthritis    "left arm/shoulder; both legs" (12/12/2015)   ASTHMA    since childhood   Cardiomyopathy (HCC) 01/25/2016   Echo 12/13/15 - Mild LVH, EF 45-50%, normal wall motion, grade 1 diastolic dysfunction, mild LAE, mild RVE, mild RAE   COPD 04/14/2007   Coronary artery disease    DIABETES MELLITUS, TYPE II 04/14/2007   GERD (gastroesophageal reflux disease)    GI bleed    Gout    High cholesterol    History of nuclear stress test    a.  Myoview 6/17: EF 54%, no ischemia   HYPERTENSION 04/14/2007   LEG PAIN, RIGHT 05/19/2008   LOW BACK PAIN 04/14/2007   Myocardial infarction (HCC)    ~ 2001/notes 09/13/2001 (03/22/2013)   PANCREATITIS, HX OF 04/14/2007   Pneumonia    "twice" (12/12/2015)   SEIZURE DISORDER 04/14/2007   "used to have them when I was young" (03/22/2013)   Shortness of breath    "related to asthma" (03/22/2013)    Patient Active Problem List   Diagnosis Date Noted   Acute pyelonephritis 03/18/2022   Elevated troponin  03/18/2022   Chronic diastolic CHF (congestive heart failure) (HCC) 03/18/2022   Physical debility 09/17/2021   Acute right hip pain 09/16/2021   HLD (hyperlipidemia) 09/15/2021   COVID-19 virus infection 09/15/2021   Acute exacerbation of chronic obstructive pulmonary disease (COPD) (HCC) 03/11/2021   SIRS (systemic inflammatory response syndrome) (HCC) 03/11/2021   Malnutrition of moderate degree 02/11/2021   Hyperkalemia 02/10/2021   Dyspnea 02/08/2021   Community acquired pneumonia of right upper lobe of lung    Respiratory failure with hypoxia and hypercapnia (HCC) 10/11/2018   Acute pain of left shoulder 10/15/2016   Pain in right leg 10/15/2016   Weakness of right leg 10/15/2016   Cardiomyopathy (HCC) 01/25/2016   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   Abnormal echocardiogram 12/13/2015   Syncope 12/12/2015   Tobacco use disorder 09/07/2015   Gout 01/27/2012   Cocaine use 12/22/2011   Chest pain 12/18/2008   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   Hypertension 04/14/2007   Asthma 04/14/2007   COPD (chronic obstructive pulmonary disease) (HCC) 04/14/2007   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 Authorizing Provider  albuterol (PROAIR HFA) 108 (90 BASE) MCG/ACT inhaler INHALE 2 PUFFS INTO THE LUNGS EVERY 6 (SIX) HOURS AS NEEDED FOR WHEEZING. Patient taking differently: Inhale 1 puff into the lungs every 6 (six) hours as needed for shortness of breath. 03/05/15   Gordy Savers, MD  allopurinol (ZYLOPRIM) 100 MG tablet Take 2 tablets (200 mg total) by mouth daily. 03/05/15   Gordy Savers, MD  amLODipine (NORVASC) 5 MG tablet Take 5 mg by mouth daily. 09/20/21   [provider]  aspirin ([employer] ASPIRIN LOW DOSE) 81 MG EC tablet Take 1 tablet (81 mg total) by mouth daily. Swallow whole. 03/16/21   Leroy Sea, MD  atorvastatin (LIPITOR) 20 MG tablet TAKE 1 TABLET BY MOUTH EVERY DAY Patient taking differently: Take 20 mg by mouth daily. 06/02/16   Gordy Savers, MD  colchicine (COLCRYS) 0.6 MG tablet Take 1 tablet (0.6 mg total) by mouth 2 (two) times daily as needed (gout flares). 02/13/21   Arrien, York Ram, MD  fluticasone (FLONASE) 50 MCG/ACT nasal spray Place 2 sprays into both nostrils 2 (two) times daily. 02/13/21   Arrien, York Ram, MD  fluticasone-salmeterol (ADVAIR) 250-50 MCG/ACT AEPB Inhale 1 puff into the lungs in the morning and at bedtime. 02/13/21   Arrien, York Ram, MD  gabapentin (NEURONTIN) 300 MG capsule Take 300 mg by mouth 3 (three) times daily. 01/19/21   [provider]  ipratropium-albuterol (DUONEB) 0.5-2.5 (3) MG/3ML SOLN Take 3 mLs by nebulization every 6 (six) hours as needed. Patient taking differently: Take 3 mLs by nebulization every 6 (six) hours as needed (shortness of breath). 02/13/21   Arrien, York Ram, MD  metFORMIN (GLUCOPHAGE) 500 MG tablet Take 1 tablet (500 mg total) by mouth 2 (two) times daily. 03/20/22 04/19/22  Dorcas Carrow, MD  omeprazole (PRILOSEC) 20 MG capsule Take 1 capsule (20 mg total) by mouth daily. 03/05/15   Gordy Savers, MD  Vibegron (GEMTESA) 75 MG TABS Take 1 tablet by mouth daily.    [provider]  Vitamin D, Ergocalciferol, (DRISDOL) 1.25 MG (50000 UNIT) CAPS capsule Take 50,000 Units by mouth every Monday.    [provider]    Family History Family History  Problem Relation Age of Onset   Heart disease Sister    Heart disease  Brother    Heart disease Brother    Heart disease Brother    Heart disease Brother     Social History Social History   Tobacco Use   Smoking status: Former    Packs/day: 0.25    Years: 57.00    Additional pack years: 0.00    Total pack years: 14.25    Types: Cigarettes    Quit date: 11/24/2015    Years since quitting: 7.1   Smokeless tobacco: Never  Substance Use Topics   Alcohol use: Not Currently    Comment: 12/12/2015 "quit drinking years ago; never had problem w/it"   Drug use: Not Currently    Types: Cocaine     Allergies   Patient has no known allergies.   Review of Systems Review of Systems  Constitutional:  Positive for chills and fever.  HENT:  Negative for  sore throat.   Respiratory:  Positive for shortness of breath. Negative for cough and wheezing.   Cardiovascular:  Negative for chest pain.  Gastrointestinal:  Negative for abdominal pain.  Genitourinary:  Negative for dysuria and flank pain.  Musculoskeletal:  Positive for neck pain.  Neurological:  Negative for syncope.     Physical Exam Triage Vital Signs ED Triage Vitals [01/03/23 1252]  Enc Vitals Group     BP (!) 158/71     Pulse Rate 78     Resp (!) 22     Temp 97.8 F (36.6 C)     Temp Source Oral     SpO2 (!) 89 %     Weight      Height      Head Circumference      Peak Flow      Pain Score 10     Pain Loc      Pain Edu?      Excl. in GC?    No data found.  Updated Vital Signs BP (!) 158/71 (BP Location: Left Arm)   Pulse 78   Temp 97.8 F (36.6 C) (Oral)   Resp (!) 22   SpO2 (!) 89%   Visual Acuity Right Eye Distance:   Left Eye Distance:   Bilateral Distance:    Right Eye Near:   Left Eye Near:    Bilateral Near:     Physical Exam Vitals and nursing note reviewed.  Constitutional:      General: He is in acute distress.     Appearance: He is ill-appearing.  HENT:     Head: Normocephalic and atraumatic.     Mouth/Throat:     Mouth: Mucous membranes are moist.   Cardiovascular:     Rate and Rhythm: Normal rate and regular rhythm.  Pulmonary:     Effort: Tachypnea present.     Breath sounds: Decreased breath sounds present.  Musculoskeletal:     Right lower leg: No tenderness. No edema.     Left lower leg: No tenderness. No edema.  Neurological:     General: No focal deficit present.     Mental Status: He is alert and oriented to person, place, and time.  Psychiatric:        Mood and Affect: Mood normal.        Behavior: Behavior normal.      UC Treatments / Results  Labs (all labs ordered are listed, but only abnormal results are displayed) Labs Reviewed  POCT URINALYSIS DIP (MANUAL ENTRY) - Abnormal; Notable for the following components:      Result Value   Clarity, UA hazy (*)    Ketones, POC UA trace (5) (*)    Blood, UA small (*)    Protein Ur, POC =100 (*)    Leukocytes, UA Trace (*)    All other components within normal limits  URINE CULTURE    EKG   Radiology No results found.  Procedures Procedures (including critical care time)  Medications Ordered in UC Medications - No data to display  Initial Impression / Assessment and Plan / UC Course  I have reviewed the triage vital signs and the nursing notes.  Pertinent labs & imaging results that were available during my care of the patient were reviewed by me and considered in my medical decision making (see chart for details).  Vitals and triage reviewed, patient is hypoxic and on 3 L via nasal cannula with tachypnea.  Lung sounds are  diminished.  EKG shows normal sinus rhythm, without acute ST elevation or depression.  Patient without active chest pain.  Urinalysis significant for trace ketones, small red blood cells, protein and trace leukocytes.  Chest x-ray obtained in clinic, overread pending.  Concern for heart failure exacerbation, COPD / asthma exacerbation or pyelonephritis, and acute respiratory failure.  Discussed with patient and visitor, ambulance to  transport to the nearest emergency department for further evaluation. Patient in agreement w/ POC.      Final Clinical Impressions(s) / UC Diagnoses   Final diagnoses:  Shortness of breath  Acute respiratory failure with hypoxia [facility])   Discharge Instructions   None    ED Prescriptions   None    PDMP not reviewed this encounter.   Aldean Pipe, [place] N, [place] 01/03/23 1354
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
## 2023-01-03 NOTE — ED Triage Notes (Signed)
Pt here via [facility] from UC with reports of neck pain, shob and shaking. Pt noted to be in the mid 80's on RA. Placed on 4L [place] with saturation of 96%. Pt alert and oriented. Reports neck pain for the last 2 days. ? Fever last night.
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
## 2023-01-03 NOTE — ED Notes (Signed)
Patient is being discharged from the Urgent Care and sent to the Emergency Department via carelink . Per Harle Stanford FNP, patient is in need of higher level of care due to hypoxia. Patient is aware and verbalizes understanding of plan of care.  Vitals:   01/03/23 1252  BP: (!) 158/71  Pulse: 78  Resp: (!) 22  Temp: 97.8 F (36.6 C)  SpO2: (!) 89%

## 2023-01-03 NOTE — ED Provider Notes (Signed)
Care of the patient assumed at signout.  Now, after results are available, CT imaging does not show acute abnormality in the head or neck.  Labs, presentation, no oxygen requirement consistent with heart failure exacerbation.  Patient received IV Lasix was admitted to the hospitalist team.   Gerhard Munch, MD 01/03/23 1940

## 2023-01-03 NOTE — ED Triage Notes (Signed)
Patient presents shivering, c/o SOB and neck pain. Reports a hx of asthma, but states the last time he felt this way he was in heart failure. No obvious leg swelling visible in triage. Unable to get accurate O2 sat due to patient shaking so much. Primary complaint of neck pain due to patient reported crick in neck.

## 2023-01-03 NOTE — ED Notes (Signed)
Help patient use the urinal patient is resting with family at bedside and call bell in reach  

## 2023-01-03 NOTE — ED Notes (Signed)
Pt's soiled clothing and linens changed and clean brief placed

## 2023-01-04 ENCOUNTER — Observation Stay (HOSPITAL_BASED_OUTPATIENT_CLINIC_OR_DEPARTMENT_OTHER): Payer: 59

## 2023-01-04 ENCOUNTER — Observation Stay (HOSPITAL_COMMUNITY): Payer: 59

## 2023-01-04 DIAGNOSIS — M1000 Idiopathic gout, unspecified site: Secondary | ICD-10-CM | POA: Diagnosis not present

## 2023-01-04 DIAGNOSIS — G40909 Epilepsy, unspecified, not intractable, without status epilepticus: Secondary | ICD-10-CM | POA: Diagnosis not present

## 2023-01-04 DIAGNOSIS — N3000 Acute cystitis without hematuria: Secondary | ICD-10-CM | POA: Diagnosis not present

## 2023-01-04 DIAGNOSIS — N1832 Chronic kidney disease, stage 3b: Secondary | ICD-10-CM | POA: Diagnosis not present

## 2023-01-04 DIAGNOSIS — M109 Gout, unspecified: Secondary | ICD-10-CM | POA: Diagnosis present

## 2023-01-04 DIAGNOSIS — J441 Chronic obstructive pulmonary disease with (acute) exacerbation: Secondary | ICD-10-CM | POA: Diagnosis not present

## 2023-01-04 DIAGNOSIS — I5031 Acute diastolic (congestive) heart failure: Secondary | ICD-10-CM | POA: Diagnosis not present

## 2023-01-04 DIAGNOSIS — J449 Chronic obstructive pulmonary disease, unspecified: Secondary | ICD-10-CM | POA: Diagnosis not present

## 2023-01-04 DIAGNOSIS — T380X5A Adverse effect of glucocorticoids and synthetic analogues, initial encounter: Secondary | ICD-10-CM | POA: Diagnosis present

## 2023-01-04 DIAGNOSIS — E1169 Type 2 diabetes mellitus with other specified complication: Secondary | ICD-10-CM | POA: Diagnosis not present

## 2023-01-04 DIAGNOSIS — N281 Cyst of kidney, acquired: Secondary | ICD-10-CM | POA: Diagnosis not present

## 2023-01-04 DIAGNOSIS — M545 Low back pain, unspecified: Secondary | ICD-10-CM | POA: Diagnosis present

## 2023-01-04 DIAGNOSIS — Z87891 Personal history of nicotine dependence: Secondary | ICD-10-CM | POA: Diagnosis not present

## 2023-01-04 DIAGNOSIS — E1122 Type 2 diabetes mellitus with diabetic chronic kidney disease: Secondary | ICD-10-CM | POA: Diagnosis not present

## 2023-01-04 DIAGNOSIS — I130 Hypertensive heart and chronic kidney disease with heart failure and stage 1 through stage 4 chronic kidney disease, or unspecified chronic kidney disease: Secondary | ICD-10-CM | POA: Diagnosis not present

## 2023-01-04 DIAGNOSIS — I5033 Acute on chronic diastolic (congestive) heart failure: Secondary | ICD-10-CM | POA: Diagnosis not present

## 2023-01-04 DIAGNOSIS — E876 Hypokalemia: Secondary | ICD-10-CM | POA: Diagnosis present

## 2023-01-04 DIAGNOSIS — G8929 Other chronic pain: Secondary | ICD-10-CM | POA: Diagnosis not present

## 2023-01-04 DIAGNOSIS — E785 Hyperlipidemia, unspecified: Secondary | ICD-10-CM | POA: Diagnosis not present

## 2023-01-04 DIAGNOSIS — I6502 Occlusion and stenosis of left vertebral artery: Secondary | ICD-10-CM | POA: Diagnosis not present

## 2023-01-04 DIAGNOSIS — J9601 Acute respiratory failure with hypoxia: Secondary | ICD-10-CM | POA: Diagnosis not present

## 2023-01-04 DIAGNOSIS — I429 Cardiomyopathy, unspecified: Secondary | ICD-10-CM | POA: Diagnosis not present

## 2023-01-04 DIAGNOSIS — N179 Acute kidney failure, unspecified: Secondary | ICD-10-CM | POA: Diagnosis not present

## 2023-01-04 DIAGNOSIS — M1712 Unilateral primary osteoarthritis, left knee: Secondary | ICD-10-CM | POA: Diagnosis not present

## 2023-01-04 DIAGNOSIS — N390 Urinary tract infection, site not specified: Secondary | ICD-10-CM | POA: Diagnosis present

## 2023-01-04 DIAGNOSIS — M5440 Lumbago with sciatica, unspecified side: Secondary | ICD-10-CM | POA: Diagnosis not present

## 2023-01-04 DIAGNOSIS — B952 Enterococcus as the cause of diseases classified elsewhere: Secondary | ICD-10-CM | POA: Diagnosis not present

## 2023-01-04 DIAGNOSIS — E7800 Pure hypercholesterolemia, unspecified: Secondary | ICD-10-CM | POA: Diagnosis present

## 2023-01-04 DIAGNOSIS — E1165 Type 2 diabetes mellitus with hyperglycemia: Secondary | ICD-10-CM | POA: Diagnosis present

## 2023-01-04 DIAGNOSIS — Z1152 Encounter for screening for COVID-19: Secondary | ICD-10-CM | POA: Diagnosis not present

## 2023-01-04 DIAGNOSIS — I10 Essential (primary) hypertension: Secondary | ICD-10-CM | POA: Diagnosis not present

## 2023-01-04 DIAGNOSIS — R569 Unspecified convulsions: Secondary | ICD-10-CM | POA: Diagnosis not present

## 2023-01-04 DIAGNOSIS — K219 Gastro-esophageal reflux disease without esophagitis: Secondary | ICD-10-CM | POA: Diagnosis present

## 2023-01-04 DIAGNOSIS — I2510 Atherosclerotic heart disease of native coronary artery without angina pectoris: Secondary | ICD-10-CM | POA: Diagnosis present

## 2023-01-04 LAB — CBC
HCT: 34.9 % — ABNORMAL LOW (ref 39.0–52.0)
Hemoglobin: 11.3 g/dL — ABNORMAL LOW (ref 13.0–17.0)
MCH: 26.5 pg (ref 26.0–34.0)
MCHC: 32.4 g/dL (ref 30.0–36.0)
MCV: 81.9 fL (ref 80.0–100.0)
Platelets: 230 10*3/uL (ref 150–400)
RBC: 4.26 MIL/uL (ref 4.22–5.81)
RDW: 15.2 % (ref 11.5–15.5)
WBC: 9.4 10*3/uL (ref 4.0–10.5)
nRBC: 0 % (ref 0.0–0.2)

## 2023-01-04 LAB — ECHOCARDIOGRAM COMPLETE
AR max vel: 2.48 cm2
AV Area VTI: 2.81 cm2
AV Area mean vel: 2.32 cm2
AV Mean grad: 3 mmHg
AV Peak grad: 5.9 mmHg
Ao pk vel: 1.21 m/s
Area-P 1/2: 2 cm2
S' Lateral: 2.2 cm
Weight: 2821.89 oz

## 2023-01-04 LAB — GLUCOSE, CAPILLARY
Glucose-Capillary: 94 mg/dL (ref 70–99)
Glucose-Capillary: 81 mg/dL (ref 70–99)
Glucose-Capillary: 89 mg/dL (ref 70–99)
Glucose-Capillary: 167 mg/dL — ABNORMAL HIGH (ref 70–99)
Glucose-Capillary: 146 mg/dL — ABNORMAL HIGH (ref 70–99)

## 2023-01-04 LAB — COMPREHENSIVE METABOLIC PANEL
ALT: 13 U/L (ref 0–44)
AST: 24 U/L (ref 15–41)
Albumin: 3.2 g/dL — ABNORMAL LOW (ref 3.5–5.0)
Alkaline Phosphatase: 78 U/L (ref 38–126)
Anion gap: 13 (ref 5–15)
BUN: 22 mg/dL (ref 8–23)
CO2: 22 mmol/L (ref 22–32)
Calcium: 8 mg/dL — ABNORMAL LOW (ref 8.9–10.3)
Chloride: 100 mmol/L (ref 98–111)
Creatinine, Ser: 1.72 mg/dL — ABNORMAL HIGH (ref 0.61–1.24)
GFR, Estimated: 40 mL/min — ABNORMAL LOW (ref >60)
Glucose, Bld: 101 mg/dL — ABNORMAL HIGH (ref 70–99)
Potassium: 3.5 mmol/L (ref 3.5–5.1)
Sodium: 135 mmol/L (ref 135–145)
Total Bilirubin: 1.3 mg/dL — ABNORMAL HIGH (ref 0.3–1.2)
Total Protein: 6.8 g/dL (ref 6.5–8.1)

## 2023-01-04 MED ORDER — IPRATROPIUM-ALBUTEROL 0.5-2.5 (3) MG/3ML IN SOLN
3.0000 mL | Freq: Four times a day (QID) | RESPIRATORY_TRACT | Status: DC
  Administered 2023-01-04 – 2023-01-06 (×9): 3 mL via RESPIRATORY_TRACT
  Filled 2023-01-04 (×9): qty 3

## 2023-01-04 MED ORDER — METHOCARBAMOL 500 MG PO TABS
500.0000 mg | ORAL_TABLET | Freq: Three times a day (TID) | ORAL | Status: DC
  Administered 2023-01-04 – 2023-01-09 (×16): 500 mg via ORAL
  Filled 2023-01-04 (×16): qty 1

## 2023-01-04 MED ORDER — EMPAGLIFLOZIN 10 MG PO TABS
10.0000 mg | ORAL_TABLET | Freq: Every day | ORAL | Status: DC
  Administered 2023-01-04 – 2023-01-05 (×2): 10 mg via ORAL
  Filled 2023-01-04 (×2): qty 1

## 2023-01-04 MED ORDER — PERFLUTREN LIPID MICROSPHERE
1.0000 mL | INTRAVENOUS | Status: AC | PRN
  Administered 2023-01-04: 2 mL via INTRAVENOUS

## 2023-01-04 NOTE — Progress Notes (Signed)
Patient arrived from [facility] to [id number]. A&O x 4. VSS. SR on telemetry. Patient C/O left leg and neck pain. No skin breakdown found. Patient oriented to room and call system. CHF plan of care initiated.
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
## 2023-01-04 NOTE — Evaluation (Signed)
Physical Therapy Evaluation Patient Details Name: Luis Carter MRN: [id number] DOB: 01-Sep-1945 Today's Date: 01/04/2023  History of Present Illness  Pt is a 77 y.o. male who presented 01/03/23 with SOB and neck pain. Admitted with acute on chronic diastolic CHF. PMH: CKD 3B, seizures, HTN, HLD, gout, low back pain, DM, COPD, diastolic CHF, asthma   Clinical Impression  Pt presents with condition above and deficits mentioned below, see PT Problem List. PTA, he was mod I primarily using a SPC for functional mobility, living with his sister in a 2-level house with 3 small STE. Pt does not need to go upstairs. PT Eval limited this date by pt's pain/discomfort with increased work of breathing, L knee pain, and HTN. See General Comments below for BP info. He demonstrates deficits in overall strength, balance, activity tolerance, and power. Pt required min-modA for bed mobility and modA to transfer to stand and take a couple steps along EOB with bil HHA today. At this time, pt may benefit from short-term inpatient rehab, <3 hours/day, but if he progresses quickly he may be able to return home with HHPT follow-up. Will continue to follow acutely.       Recommendations for follow up therapy are one component of a multi-disciplinary discharge planning process, led by the attending physician.  Recommendations may be updated based on patient status, additional functional criteria and insurance authorization.  Follow Up Recommendations Can patient physically be transported by private vehicle: Yes     Assistance Recommended at Discharge Frequent or constant Supervision/Assistance  Patient can return home with the following  A lot of help with walking and/or transfers;A lot of help with bathing/dressing/bathroom;Assistance with cooking/housework;Direct supervision/assist for medications management;Direct supervision/assist for financial management;Assist for transportation;Help with stairs or ramp for  entrance    Equipment Recommendations Other (comment) (TBA)  Recommendations for Other Services  OT consult    Functional Status Assessment Patient has had a recent decline in their functional status and demonstrates the ability to make significant improvements in function in a reasonable and predictable amount of time.     Precautions / Restrictions Precautions Precautions: Fall;Other (comment) Precaution Comments: watch SpO2 and BP Restrictions Weight Bearing Restrictions: No      Mobility  Bed Mobility Overal bed mobility: Needs Assistance Bed Mobility: Supine to Sit, Sit to Supine     Supine to sit: Min assist, HOB elevated Sit to supine: Mod assist, HOB elevated   General bed mobility comments: Pt requesting HHA to pull on to ascend trunk and sit EOB, extra time and minA. ModA to direct trunk and lift legs back to supine.    Transfers Overall transfer level: Needs assistance Equipment used: 1 person hand held assist Transfers: Sit to/from Stand Sit to Stand: Mod assist           General transfer comment: ModA to shift weight anteriorly and power up to stand while gaining stability, cuing pt to hold onto therapist anterior to him for support.    Ambulation/Gait Ambulation/Gait assistance: Mod assist Gait Distance (Feet): 1 Feet Assistive device: 1 person hand held assist Gait Pattern/deviations: Decreased step length - right, Decreased step length - left, Decreased stride length, Trunk flexed, Leaning posteriorly Gait velocity: reduced Gait velocity interpretation: <1.31 ft/sec, indicative of household ambulator   General Gait Details: Pt taking a few small steps to L towards HOB with bil HHA on therapist anterior to him, providing modA for balance and reducing posterior lean.  Stairs  Wheelchair Mobility    Modified Rankin (Stroke Patients Only)       Balance Overall balance assessment: Needs assistance Sitting-balance support:  Bilateral upper extremity supported, Feet supported Sitting balance-Leahy Scale: Poor Sitting balance - Comments: Reliant on UE support   Standing balance support: Bilateral upper extremity supported, During functional activity, Reliant on assistive device for balance Standing balance-Leahy Scale: Poor Standing balance comment: Reliant on UE support and modA                             Pertinent Vitals/Pain Pain Assessment Pain Assessment: Faces Faces Pain Scale: Hurts even more Pain Location: generalized with increased work of breathing, L knee Pain Descriptors / Indicators: Discomfort, Grimacing, Guarding Pain Intervention(s): Limited activity within patient's tolerance, Monitored during session, Repositioned    Home Living Family/patient expects to be discharged to:: Private residence Living Arrangements: Other relatives (sister) Available Help at Discharge: Family;Available 24 hours/day Type of Home: House Home Access: Stairs to enter Entrance Stairs-Rails: None Entrance Stairs-Number of Steps: 3 (small)   Home Layout: Two level;Able to live on main level with bedroom/bathroom Home Equipment: Rolling Walker (2 wheels);Cane - single point      Prior Function Prior Level of Function : Needs assist             Mobility Comments: Uses cane primarily for ambulation ADLs Comments: Sister does the driving, cooking, cleaning, shopping, and managing his meds and finances, but otherwise pt is mod I     Hand Dominance        Extremity/Trunk Assessment   Upper Extremity Assessment Upper Extremity Assessment: Defer to OT evaluation    Lower Extremity Assessment Lower Extremity Assessment: Generalized weakness    Cervical / Trunk Assessment Cervical / Trunk Assessment: Kyphotic  Communication   Communication: No difficulties  Cognition Arousal/Alertness: Awake/alert Behavior During Therapy: Anxious Overall Cognitive Status: No family/caregiver present to  determine baseline cognitive functioning                                 General Comments: Pt distracted by discomfort with increased work of breathing. Difficult to fully assess.        General Comments General comments (skin integrity, edema, etc.): BP 170s/150s sitting EOB, thus performed transfer to get pt to [facility] to lay back down for pt safety, but BP remained this with return to supine; unreliable plethon 2-3L O2 throughout    Exercises     Assessment/Plan    PT Assessment Patient needs continued PT services  PT Problem List Decreased strength;Decreased activity tolerance;Decreased balance;Decreased mobility;Cardiopulmonary status limiting activity;Pain       PT Treatment Interventions DME instruction;Gait training;Stair training;Functional mobility training;Therapeutic activities;Therapeutic exercise;Balance training;Neuromuscular re-education;Patient/family education    PT Goals (Current goals can be found in the Care Plan section)  Acute Rehab PT Goals Patient Stated Goal: to improve PT Goal Formulation: With patient Time For Goal Achievement: 01/18/23 Potential to Achieve Goals: Good    Frequency Min 1X/week     Co-evaluation               AM-PAC PT "6 Clicks" Mobility  Outcome Measure Help needed turning from your back to your side while in a flat bed without using bedrails?: A Little Help needed moving from lying on your back to sitting on the side of a flat bed without using bedrails?: A Little Help  needed moving to and from a bed to a chair (including a wheelchair)?: A Lot Help needed standing up from a chair using your arms (e.g., wheelchair or bedside chair)?: A Lot Help needed to walk in hospital room?: Total Help needed climbing 3-5 steps with a railing? : Total 6 Click Score: 12    End of Session Equipment Utilized During Treatment: Gait belt Activity Tolerance: Other (comment);Patient limited by pain (limited by HTN) Patient left:  in bed;with bed alarm set;with call bell/phone within reach Nurse Communication: Mobility status;Other (comment) (HTN) PT Visit Diagnosis: Unsteadiness on feet (R26.81);Other abnormalities of gait and mobility (R26.89);Muscle weakness (generalized) (M62.81);Difficulty in walking, not elsewhere classified (R26.2);Pain Pain - Right/Left: Left Pain - part of body: Knee    Time: 1235-1249 PT Time Calculation (min) (ACUTE ONLY): 14 min   Charges:   PT Evaluation $PT Eval Moderate Complexity: 1 Mod          Raymond Gurney, PT, DPT Acute Rehabilitation Services  Office: [phone]   Jewel Baize 01/04/2023, 2:46 PM

## 2023-01-05 DIAGNOSIS — I5033 Acute on chronic diastolic (congestive) heart failure: Secondary | ICD-10-CM | POA: Diagnosis not present

## 2023-01-05 LAB — GLUCOSE, CAPILLARY
Glucose-Capillary: 139 mg/dL — ABNORMAL HIGH (ref 70–99)
Glucose-Capillary: 140 mg/dL — ABNORMAL HIGH (ref 70–99)
Glucose-Capillary: 210 mg/dL — ABNORMAL HIGH (ref 70–99)
Glucose-Capillary: 325 mg/dL — ABNORMAL HIGH (ref 70–99)

## 2023-01-05 LAB — COMPREHENSIVE METABOLIC PANEL
ALT: 9 U/L (ref 0–44)
AST: 22 U/L (ref 15–41)
Albumin: 3 g/dL — ABNORMAL LOW (ref 3.5–5.0)
Alkaline Phosphatase: 73 U/L (ref 38–126)
Anion gap: 14 (ref 5–15)
BUN: 34 mg/dL — ABNORMAL HIGH (ref 8–23)
CO2: 21 mmol/L — ABNORMAL LOW (ref 22–32)
Calcium: 7.8 mg/dL — ABNORMAL LOW (ref 8.9–10.3)
Chloride: 98 mmol/L (ref 98–111)
Creatinine, Ser: 2.56 mg/dL — ABNORMAL HIGH (ref 0.61–1.24)
GFR, Estimated: 25 mL/min — ABNORMAL LOW (ref >60)
Glucose, Bld: 145 mg/dL — ABNORMAL HIGH (ref 70–99)
Potassium: 3.4 mmol/L — ABNORMAL LOW (ref 3.5–5.1)
Sodium: 133 mmol/L — ABNORMAL LOW (ref 135–145)
Total Bilirubin: 0.9 mg/dL (ref 0.3–1.2)
Total Protein: 6.7 g/dL (ref 6.5–8.1)

## 2023-01-05 LAB — CBC
HCT: 34.2 % — ABNORMAL LOW (ref 39.0–52.0)
Hemoglobin: 11 g/dL — ABNORMAL LOW (ref 13.0–17.0)
MCH: 26.8 pg (ref 26.0–34.0)
MCHC: 32.2 g/dL (ref 30.0–36.0)
MCV: 83.4 fL (ref 80.0–100.0)
Platelets: 248 10*3/uL (ref 150–400)
RBC: 4.1 MIL/uL — ABNORMAL LOW (ref 4.22–5.81)
RDW: 15.2 % (ref 11.5–15.5)
WBC: 8.2 10*3/uL (ref 4.0–10.5)
nRBC: 0 % (ref 0.0–0.2)

## 2023-01-05 LAB — URINE CULTURE: Culture: >=100000 — AB

## 2023-01-05 LAB — MAGNESIUM: Magnesium: 1.6 mg/dL — ABNORMAL LOW (ref 1.7–2.4)

## 2023-01-05 MED ORDER — POTASSIUM CHLORIDE CRYS ER 20 MEQ PO TBCR
40.0000 meq | EXTENDED_RELEASE_TABLET | Freq: Once | ORAL | Status: AC
  Administered 2023-01-05: 40 meq via ORAL
  Filled 2023-01-05: qty 2

## 2023-01-05 MED ORDER — PREDNISONE 20 MG PO TABS
40.0000 mg | ORAL_TABLET | Freq: Every day | ORAL | Status: DC
  Administered 2023-01-05 – 2023-01-08 (×4): 40 mg via ORAL
  Filled 2023-01-05 (×4): qty 2

## 2023-01-05 MED ORDER — GABAPENTIN 300 MG PO CAPS
300.0000 mg | ORAL_CAPSULE | Freq: Two times a day (BID) | ORAL | Status: DC
  Administered 2023-01-05 – 2023-01-09 (×8): 300 mg via ORAL
  Filled 2023-01-05 (×8): qty 1

## 2023-01-05 MED ORDER — ENOXAPARIN SODIUM 30 MG/0.3ML IJ SOSY
30.0000 mg | PREFILLED_SYRINGE | INTRAMUSCULAR | Status: DC
  Administered 2023-01-05 – 2023-01-08 (×4): 30 mg via SUBCUTANEOUS
  Filled 2023-01-05 (×4): qty 0.3

## 2023-01-05 MED ORDER — MAGNESIUM SULFATE 2 GM/50ML IV SOLN
2.0000 g | Freq: Once | INTRAVENOUS | Status: AC
  Administered 2023-01-05: 2 g via INTRAVENOUS
  Filled 2023-01-05: qty 50
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
## 2023-01-05 NOTE — Progress Notes (Signed)
Patient experienced a brief syncopal episode while working with PT at 1120. Patient had just sat in the chair after using the bedside commode before he became unresponsive briefly. No telemetry changes we reported, and patient's HR remained in the 80s during the syncopal episode. Patient responded to pain stimuli, and began talking as soon as he was put back in bed. EKG done, showing NSR with PVCs. Patient's sister at bedside, and was in the room when patient became unresponsive. MD notified. Elnita Maxwell RN

## 2023-01-05 NOTE — Progress Notes (Signed)
Heart Failure Navigator Progress Note  Assessed for Heart & Vascular TOC clinic readiness.  Patient does not meet criteria due to EF 60-65%, here with COPD, No HF TOC per Dr. Jomarie Longs.   Navigator will sign off at this time.   Rhae Hammock, BSN, Scientist, clinical (histocompatibility and immunogenetics) Only

## 2023-01-05 NOTE — Evaluation (Signed)
Occupational Therapy Evaluation Patient Details Name: Luis Carter MRN: [id number] DOB: [birth year] Today's Date: 01/05/2023   History of Present Illness Pt is a 77 y.o. male who presented 01/03/23 with SOB and neck pain. Admitted with acute on chronic diastolic CHF. PMH: CKD 3B, seizures, HTN, HLD, gout, low back pain, DM, COPD, diastolic CHF, asthma   Clinical Impression   Pt admitted for above dx, PTA patient lived with his sister and ambulated using SPC and reports independence in bADLs but sister completes iADLs. Pt currently presenting with impaired balance and is limited by SOB and decreased activity tolerance. During OT session, pt decreased in responsiveness and BP dropped upon sitting in recliner following BSC use. RN and NT in room assisting OT and PT during session to ensure pt does not code. Upon return to supine position in bed, pt wiggling toes and speaking once more. Pt would benefit from continued acute skilled OT services to address above deficits and help transition to next level of care. Patient would benefit from post acute skilled rehab facility with <3 hours of therapy and 24/7 support       Recommendations for follow up therapy are one component of a multi-disciplinary discharge planning process, led by the attending physician.  Recommendations may be updated based on patient status, additional functional criteria and insurance authorization.   Assistance Recommended at Discharge Frequent or constant Supervision/Assistance  Patient can return home with the following A lot of help with walking and/or transfers;A lot of help with bathing/dressing/bathroom;Assistance with cooking/housework;Assist for transportation;Help with stairs or ramp for entrance;Direct supervision/assist for medications management;Direct supervision/assist for financial management    Functional Status Assessment  Patient has had a recent decline in their functional status and demonstrates the ability  to make significant improvements in function in a reasonable and predictable amount of time.  Equipment Recommendations  None recommended by OT (defer to next level of care)    Recommendations for Other Services       Precautions / Restrictions Precautions Precautions: Fall;Other (comment) Precaution Comments: watch SpO2 and BP Restrictions Weight Bearing Restrictions: No      Mobility Bed Mobility Overal bed mobility: Needs Assistance Bed Mobility: Sit to Supine, Supine to Sit     Supine to sit: Mod assist Sit to supine: Max assist   General bed mobility comments: Pt needing more assist following event of decreased responsivess    Transfers Overall transfer level: Needs assistance Equipment used: 2 person hand held assist Transfers: Sit to/from Stand Sit to Stand: Min assist                  Balance Overall balance assessment: Needs assistance Sitting-balance support: Bilateral upper extremity supported, Feet supported Sitting balance-Leahy Scale: Fair     Standing balance support: Bilateral upper extremity supported, During functional activity, Reliant on assistive device for balance Standing balance-Leahy Scale: Poor Standing balance comment: Reliant on UE support and modA                           ADL either performed or assessed with clinical judgement   ADL Overall ADL's : Needs assistance/impaired Eating/Feeding: Bed level;Supervision/ safety;Set up   Grooming: Sitting;Min guard   Upper Body Bathing: Sitting;Min guard   Lower Body Bathing: Sitting/lateral leans;Min guard   Upper Body Dressing : Sitting;Supervision/safety;Set up   Lower Body Dressing: Sitting/lateral leans;Maximal assistance Lower Body Dressing Details (indicate cue type and reason): Max A to doff/don bilat socks  Toilet Transfer: BSC/3in1;Stand-pivot;+2 for physical assistance;Moderate assistance (stand step pivot)   Toileting- Clothing Manipulation and Hygiene: Sit  to/from stand;Maximal assistance Toileting - Clothing Manipulation Details (indicate cue type and reason): Rear pericare Tub/ Shower Transfer: +2 for physical assistance;Moderate assistance   Functional mobility during ADLs: Moderate assistance;+2 for physical assistance General ADL Comments: Pt needing Max A +2 for functional transfers following inconsistent responses after vasovagal/orthostatic experience     Vision         Perception     Praxis      Pertinent Vitals/Pain Pain Assessment Pain Assessment: 0-10 Pain Score: 3  Pain Location: neck Pain Descriptors / Indicators: Discomfort, Grimacing, Guarding, Aching Pain Intervention(s): Limited activity within patient's tolerance, Monitored during session     Hand Dominance Right   Extremity/Trunk Assessment Upper Extremity Assessment Upper Extremity Assessment: Generalized weakness   Lower Extremity Assessment Lower Extremity Assessment: Generalized weakness   Cervical / Trunk Assessment Cervical / Trunk Assessment: Kyphotic   Communication Communication Communication: No difficulties   Cognition Arousal/Alertness: Awake/alert Behavior During Therapy: Flat affect Overall Cognitive Status: Difficult to assess                                 General Comments: Pt initially following simple steps commands but towards end of session pt began inconsistently follow commands and became non verbal. Pt Bp began to drop but HR was consistent, pt likely had vasovagal or orthostatic response following toileting     General Comments  BP 120/79 upon sitting EOB, once placed in chair pt became more unresponsive but still yawning with stable HR and BP decreasing to 82/52. Once returned to supine 138/53, unable to get Sp02 due to inconsistent pleths. Pt following more commands and speaking again once supine    Exercises     Shoulder Instructions      Home Living Family/patient expects to be discharged to:: Private  residence Living Arrangements: Other relatives (sister) Available Help at Discharge: Family;Available 24 hours/day Type of Home: House Home Access: Stairs to enter [employer] of Steps: 3 (small) Entrance Stairs-Rails: None Home Layout: Two level;Able to live on main level with bedroom/bathroom     Bathroom Shower/Tub: Chief Strategy Officer: Standard     Home Equipment: Agricultural consultant (2 wheels);Cane - single point          Prior Functioning/Environment Prior Level of Function : Needs assist             Mobility Comments: Uses cane primarily for ambulation ADLs Comments: Sister does the driving, cooking, cleaning, shopping, and managing his meds and finances, but otherwise pt is mod I        OT Problem List: Decreased strength;Cardiopulmonary status limiting activity;Impaired balance (sitting and/or standing);Decreased activity tolerance      OT Treatment/Interventions: Self-care/ADL training;Therapeutic activities;Therapeutic exercise;DME and/or AE instruction;Patient/family education;Balance training;Energy conservation    OT Goals(Current goals can be found in the care plan section) Acute Rehab OT Goals Patient Stated Goal: none stated OT Goal Formulation: With patient Time For Goal Achievement: 01/19/23  OT Frequency: Min 2X/week    Co-evaluation PT/OT/SLP Co-Evaluation/Treatment: Yes Reason for Co-Treatment: Complexity of the patient's impairments (multi-system involvement);For patient/therapist safety PT goals addressed during session: Mobility/safety with mobility;Balance OT goals addressed during session: ADL's and self-care      AM-PAC OT "6 Clicks" Daily Activity     Outcome Measure Help from another person eating meals?: A  Little Help from another person taking care of personal grooming?: A Little Help from another person toileting, which includes using toliet, bedpan, or urinal?: A Lot Help from another person bathing  (including washing, rinsing, drying)?: A Lot Help from another person to put on and taking off regular upper body clothing?: A Little Help from another person to put on and taking off regular lower body clothing?: A Lot 6 Click Score: 15   End of Session Equipment Utilized During Treatment: Gait belt Nurse Communication: Mobility status  Activity Tolerance: Other (comment) (Limited secondary to medical issues) Patient left: in bed;with call bell/phone within reach;with nursing/sitter in room  OT Visit Diagnosis: Unsteadiness on feet (R26.81);Muscle weakness (generalized) (M62.81)                Time: 1610-9604 OT Time Calculation (min): 49 min Charges:  OT General Charges $OT Visit: 1 Visit OT Evaluation $OT Eval Moderate Complexity: 1 Mod  01/05/2023  AB, OTR/L  Acute Rehabilitation Services  Office: [phone]   Tristan Schroeder 01/05/2023, 12:37 PM

## 2023-01-05 NOTE — Progress Notes (Signed)
Physical Therapy Treatment Patient Details Name: Luis Carter MRN: [id number] DOB: 03-17-1946 Today's Date: 01/05/2023   History of Present Illness Pt is a 77 y.o. male who presented 01/03/23 with SOB and neck pain. Admitted with acute on chronic diastolic CHF. PMH: CKD 3B, seizures, HTN, HLD, gout, low back pain, DM, COPD, diastolic CHF, asthma    PT Comments    Pt with poor tolerance to treatment today. Co treat with OT. Today required +2 Min/Mod A for transferring from bed to [facility] then to chair. Once in chair pt became unresponsive while NT was taking blood sugar and required +2 Max A back to bed for a STAT EKG from nursing. During event, pt HR remained in the 80s however BP dropped to 82/52 while in chair. Once back in bed, pt became responsive again with vital signs stablized on 2L. Pt left with nursing staff performing EKG. No change in DC/DME recs at this time. PT will continue to follow.  Recommendations for follow up therapy are one component of a multi-disciplinary discharge planning process, led by the attending physician.  Recommendations may be updated based on patient status, additional functional criteria and insurance authorization.  Follow Up Recommendations  Can patient physically be transported by private vehicle: No    Assistance Recommended at Discharge Frequent or constant Supervision/Assistance  Patient can return home with the following A lot of help with walking and/or transfers;A lot of help with bathing/dressing/bathroom;Assistance with cooking/housework;Direct supervision/assist for medications management;Direct supervision/assist for financial management;Assist for transportation;Help with stairs or ramp for entrance   Equipment Recommendations  Other (comment) (Per accepting facility)    Recommendations for Other Services       Precautions / Restrictions Precautions Precautions: Fall;Other (comment) Precaution Comments: watch SpO2 and  BP Restrictions Weight Bearing Restrictions: No     Mobility  Bed Mobility Overal bed mobility: Needs Assistance Bed Mobility: Sit to Supine, Supine to Sit     Supine to sit: Mod assist Sit to supine: Max assist   General bed mobility comments: Pt needing more assist following event of decreased responsivess    Transfers Overall transfer level: Needs assistance Equipment used: 2 person hand held assist Transfers: Sit to/from Stand, Bed to chair/wheelchair/BSC Sit to Stand: Min assist   Step pivot transfers: +2 physical assistance, Mod assist, Max assist       General transfer comment: Pt able to transfer from bed to [facility] and then to chair with +2 Mod A HHA. After event of decreased responsiveness in chair and required +2 Max A back to bed.    Ambulation/Gait               General Gait Details: Deferred due to event   Stairs             Wheelchair Mobility    Modified Rankin (Stroke Patients Only)       Balance Overall balance assessment: Needs assistance Sitting-balance support: Bilateral upper extremity supported, Feet supported Sitting balance-Leahy Scale: Fair     Standing balance support: Bilateral upper extremity supported, During functional activity, Reliant on assistive device for balance Standing balance-Leahy Scale: Poor Standing balance comment: Reliant on UE support and modA                            Cognition Arousal/Alertness: Awake/alert Behavior During Therapy: Flat affect Overall Cognitive Status: Difficult to assess  General Comments: Pt initially following simple steps commands but towards end of session pt began inconsistently follow commands and became non verbal. Pt Bp began to drop but HR was consistent, pt likely had vasovagal or orthostatic response following toileting        Exercises      General Comments General comments (skin integrity, edema, etc.): BP  120/79 upon sitting EOB, once placed in chair pt became more unresponsive but still yawning with stable HR and BP decreasing to 82/52. Once returned to supine 138/53, unable to get Sp02 due to inconsistent pleths. Pt following more commands and speaking again once supine      Pertinent Vitals/Pain Pain Assessment Pain Assessment: 0-10 Pain Score: 3  Pain Location: neck Pain Descriptors / Indicators: Discomfort, Grimacing, Guarding, Aching Pain Intervention(s): Limited activity within patient's tolerance, Monitored during session    Home Living Family/patient expects to be discharged to:: Private residence Living Arrangements: Other relatives (sister) Available Help at Discharge: Family;Available 24 hours/day Type of Home: House Home Access: Stairs to enter Entrance Stairs-Rails: None Entrance Stairs-Number of Steps: 3 (small)   Home Layout: Two level;Able to live on main level with bedroom/bathroom Home Equipment: Rolling Walker (2 wheels);Cane - single point      Prior Function            PT Goals (current goals can now be found in the care plan section) Progress towards PT goals: Progressing toward goals    Frequency    Min 1X/week      PT Plan Current plan remains appropriate    Co-evaluation PT/OT/SLP Co-Evaluation/Treatment: Yes Reason for Co-Treatment: Complexity of the patient's impairments (multi-system involvement);For patient/therapist safety PT goals addressed during session: Mobility/safety with mobility;Balance OT goals addressed during session: ADL's and self-care      AM-PAC PT "6 Clicks" Mobility   Outcome Measure  Help needed turning from your back to your side while in a flat bed without using bedrails?: A Little Help needed moving from lying on your back to sitting on the side of a flat bed without using bedrails?: A Lot Help needed moving to and from a bed to a chair (including a wheelchair)?: A Lot Help needed standing up from a chair using  your arms (e.g., wheelchair or bedside chair)?: A Lot Help needed to walk in hospital room?: Total Help needed climbing 3-5 steps with a railing? : Total 6 Click Score: 11    End of Session Equipment Utilized During Treatment: Gait belt Activity Tolerance: Other (comment);Patient limited by pain (Pt limited by an event of decreased responsiveness once in chair.) Patient left: in bed;with bed alarm set;with call bell/phone within reach;with nursing/sitter in room;with family/visitor present (Nursing staff performing STAT EKG) Nurse Communication: Mobility status;Other (comment) (Pt with event of decreased responsiveness) PT Visit Diagnosis: Unsteadiness on feet (R26.81);Other abnormalities of gait and mobility (R26.89);Muscle weakness (generalized) (M62.81);Difficulty in walking, not elsewhere classified (R26.2);Pain     Time: 4098-1191 PT Time Calculation (min) (ACUTE ONLY): 47 min  Charges:  $Therapeutic Activity: 23-37 mins                     Shela Nevin, PT, DPT Acute Rehab Services [phone]    [name] 01/05/2023, 1:14 PM

## 2023-01-06 ENCOUNTER — Inpatient Hospital Stay (HOSPITAL_COMMUNITY): Payer: 59

## 2023-01-06 DIAGNOSIS — I5033 Acute on chronic diastolic (congestive) heart failure: Secondary | ICD-10-CM | POA: Diagnosis not present

## 2023-01-06 LAB — GLUCOSE, CAPILLARY
Glucose-Capillary: 196 mg/dL — ABNORMAL HIGH (ref 70–99)
Glucose-Capillary: 194 mg/dL — ABNORMAL HIGH (ref 70–99)
Glucose-Capillary: 218 mg/dL — ABNORMAL HIGH (ref 70–99)
Glucose-Capillary: 204 mg/dL — ABNORMAL HIGH (ref 70–99)

## 2023-01-06 LAB — BASIC METABOLIC PANEL
Anion gap: 11 (ref 5–15)
Anion gap: 11 (ref 5–15)
BUN: 46 mg/dL — ABNORMAL HIGH (ref 8–23)
BUN: 53 mg/dL — ABNORMAL HIGH (ref 8–23)
CO2: 21 mmol/L — ABNORMAL LOW (ref 22–32)
CO2: 22 mmol/L (ref 22–32)
Calcium: 8.1 mg/dL — ABNORMAL LOW (ref 8.9–10.3)
Calcium: 8.3 mg/dL — ABNORMAL LOW (ref 8.9–10.3)
Chloride: 97 mmol/L — ABNORMAL LOW (ref 98–111)
Chloride: 97 mmol/L — ABNORMAL LOW (ref 98–111)
Creatinine, Ser: 3.11 mg/dL — ABNORMAL HIGH (ref 0.61–1.24)
Creatinine, Ser: 2.58 mg/dL — ABNORMAL HIGH (ref 0.61–1.24)
GFR, Estimated: 20 mL/min — ABNORMAL LOW (ref >60)
GFR, Estimated: 25 mL/min — ABNORMAL LOW (ref >60)
Glucose, Bld: 311 mg/dL — ABNORMAL HIGH (ref 70–99)
Glucose, Bld: 243 mg/dL — ABNORMAL HIGH (ref 70–99)
Potassium: 4.4 mmol/L (ref 3.5–5.1)
Potassium: 4.1 mmol/L (ref 3.5–5.1)
Sodium: 129 mmol/L — ABNORMAL LOW (ref 135–145)
Sodium: 130 mmol/L — ABNORMAL LOW (ref 135–145)

## 2023-01-06 LAB — MAGNESIUM: Magnesium: 2.4 mg/dL (ref 1.7–2.4)

## 2023-01-06 MED ORDER — INSULIN ASPART 100 UNIT/ML IJ SOLN
3.0000 [IU] | Freq: Three times a day (TID) | INTRAMUSCULAR | Status: DC
  Administered 2023-01-06 – 2023-01-09 (×10): 3 [IU] via SUBCUTANEOUS

## 2023-01-06 MED ORDER — AMPICILLIN 500 MG PO CAPS
500.0000 mg | ORAL_CAPSULE | Freq: Two times a day (BID) | ORAL | Status: AC
  Administered 2023-01-06 – 2023-01-08 (×6): 500 mg via ORAL
  Filled 2023-01-06 (×6): qty 1

## 2023-01-06 MED ORDER — SODIUM CHLORIDE 0.9 % IV SOLN: INTRAVENOUS | Status: AC

## 2023-01-06 NOTE — TOC Initial Note (Signed)
Transition of Care [facility]) - Initial/Assessment Note    Patient Details  Name: Luis Carter MRN: [id number] Date of Birth: Mar 16, 1946  Transition of Care [facility]) CM/SW Contact:    Leander Rams, LCSW Phone Number: 01/06/2023, 4:10 PM  Clinical Narrative:                 CSW met with pt at bedside to discuss dc recommendation for STR at SNF. Pt is agreeable to go to rehab and stated he needs to get stronger.   Pt shared some of his life with CSW. CSW provided reflective listening. Pt reports he lives with his family and would prefer to stay in [place] so his sister doesn't ave to drive far to see him. CSW completed fl2 and faxed out.   Expected Discharge Plan: Skilled Nursing Facility Barriers to Discharge: Continued Medical Work up   Patient Goals and CMS Choice Patient states their goals for this hospitalization and ongoing recovery are:: Get stronger          Expected Discharge Plan and Services                                              Prior Living Arrangements/Services   Lives with:: Self, Relatives Patient language and need for interpreter reviewed:: Yes Do you feel safe going back to the place where you live?: Yes      Need for Family Participation in Patient Care: Yes (Comment) Care giver support system in place?: Yes (comment) Current home services: DME Criminal Activity/Legal Involvement Pertinent to Current Situation/Hospitalization: No - Comment as needed  Activities of Daily Living Home Assistive Devices/Equipment: Cane (specify quad or straight), Walker (specify type) ADL Screening (condition at time of admission) Patient's cognitive ability adequate to safely complete daily activities?: Yes Is the patient deaf or have difficulty hearing?: No Does the patient have difficulty seeing, even when wearing glasses/contacts?: No Does the patient have difficulty concentrating, remembering, or making decisions?: No Patient able to express need  for assistance with ADLs?: Yes Does the patient have difficulty dressing or bathing?: No Independently performs ADLs?: Yes (appropriate for developmental age) Does the patient have difficulty walking or climbing stairs?: Yes Weakness of Legs: None Weakness of Arms/Hands: None  Permission Sought/Granted Permission sought to share information with : Family Supports Permission granted to share information with : Yes, Verbal Permission Granted  Share Information with NAME: Sallye Ober     Permission granted to share info w Relationship: Sister  Permission granted to share info w Contact Information: [phone]  Emotional Assessment Appearance:: Appears stated age, Developmentally appropriate Attitude/Demeanor/Rapport: Engaged Affect (typically observed): Accepting Orientation: : Oriented to Self, Oriented to Place, Oriented to  Time, Oriented to Situation Alcohol / Substance Use: Not Applicable Psych Involvement: No (comment)  Admission diagnosis:  Acute on chronic diastolic CHF (congestive heart failure) (HCC) [I50.33] Patient Active Problem List   Diagnosis Date Noted   Acute on chronic diastolic CHF (congestive heart failure) (HCC) 01/03/2023   Acute pyelonephritis 03/18/2022   Elevated troponin 03/18/2022   Chronic diastolic CHF (congestive heart failure) (HCC) 03/18/2022   Physical debility 09/17/2021   Acute right hip pain 09/16/2021   HLD (hyperlipidemia) 09/15/2021   COVID-19 virus infection 09/15/2021   SIRS (systemic inflammatory response syndrome) (HCC) 03/11/2021   Malnutrition of moderate degree 02/11/2021   Hyperkalemia 02/10/2021   Dyspnea 02/08/2021  Community acquired pneumonia of right upper lobe of lung    Respiratory failure with hypoxia and hypercapnia (HCC) 10/11/2018   Acute pain of left shoulder 10/15/2016   Pain in right leg 10/15/2016   Weakness of right leg 10/15/2016   Cardiomyopathy (HCC) 01/25/2016   Stage 3b chronic kidney disease (CKD) (HCC)  12/13/2015   Abnormal echocardiogram 12/13/2015   Syncope 12/12/2015   Tobacco use disorder 09/07/2015   Gout 01/27/2012   Cocaine use 12/22/2011   Chest pain 12/18/2008   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   Hypertension 04/14/2007   Asthma 04/14/2007   COPD (chronic obstructive pulmonary disease) (HCC) 04/14/2007   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   Seizures (HCC) 04/14/2007   PANCREATITIS, HX OF 04/14/2007   PCP:  Fleet Contras, MD Pharmacy:   [id number] [place],  - [address] AT [facility] & [address] [place] [address] Phone: [phone] Fax: [phone]     Social Determinants of Health (SDOH) Social History: SDOH Screenings   Food Insecurity: No Food Insecurity (01/03/2023)  Housing: Low Risk  (01/03/2023)  Transportation Needs: No Transportation Needs (01/03/2023)  Utilities: Not At Risk (01/03/2023)  Tobacco Use: Medium Risk (01/03/2023)   SDOH Interventions:     Readmission Risk Interventions    09/19/2021    2:31 PM  Readmission Risk Prevention Plan  Transportation Screening Complete  PCP or Specialist Appt within 3-5 Days Complete  HRI or Home Care Consult Complete  Social Work Consult for Recovery Care Planning/Counseling Patient refused  Palliative Care Screening Not Applicable  Medication Review Oceanographer) Complete    Oletta Lamas, MSW, LCSWA, LCASA Transitions of Care  Clinical Social Worker I

## 2023-01-06 NOTE — Progress Notes (Signed)
Pharmacy:  To begin Ampicillin for Enterococcus faecalis UTI per 01/03/23 culture from Urgent Care.  Sensitive to Amp, nitrofurantoin and Vanc (MIC 1)  Per Dr. Jomarie Longs, may use PO.  AKI on CKD3.   Plan: Ampicillin 500 mg PO BID Will follow up renal function for any need to adjust regimen. Follow up length of therapy.  Dennie Fetters, [place] 01/06/2023 12:26 PM

## 2023-01-06 NOTE — Inpatient Diabetes Management (Signed)
Inpatient Diabetes Program Recommendations  AACE/ADA: New Consensus Statement on Inpatient Glycemic Control (2015)  Target Ranges:  Prepandial:   less than 140 mg/dL      Peak postprandial:   less than 180 mg/dL (1-2 hours)      Critically ill patients:  140 - 180 mg/dL   Lab Results  Component Value Date   GLUCAP 196 (H) 01/06/2023   HGBA1C 6.7 (H) 09/11/2022    Review of Glycemic Control  Latest Reference Range & Units 01/05/23 11:21 01/05/23 16:09 01/05/23 21:13 01/06/23 06:47  Glucose-Capillary 70 - 99 mg/dL 161 (H) 096 (H) 045 (H) 196 (H)  (H): Data is abnormally high Diabetes history: Type 2 DM Outpatient Diabetes medications: Metformin 500 mg BID Current orders for Inpatient glycemic control: Novolog 0-9 units TID Prednisone 40 mg QD  Inpatient Diabetes Program Recommendations:   In the setting of steroids: Consider adding Novolog 3 units TID (assuming patient is consuming >50% of meals)  Thanks, Lujean Rave, MSN, RNC-OB Diabetes Coordinator [phone] (8a-5p)

## 2023-01-06 NOTE — [place] FL2 (Signed)
[place] MEDICAID FL2 LEVEL OF CARE FORM     IDENTIFICATION  Patient Name: Luis Carter Birthdate: Mar 21, 1946 Sex: male Admission Date (Current Location): 01/03/2023  [facility] and [place] Number:  Producer, television/film/video and Address:  [facility] [place]. [facility], [address], [place], [place] [address]      Provider Number: [id number]  Attending Physician Name and Address:  Zannie Cove, MD  Relative Name and Phone Number:       Current Level of Care: Hospital Recommended Level of Care: Skilled Nursing Facility Prior Approval Number:    Date Approved/Denied:   PASRR Number: [id number]  Discharge Plan: SNF    Current Diagnoses: Patient Active Problem List   Diagnosis Date Noted   Acute on chronic diastolic CHF (congestive heart failure) (HCC) 01/03/2023   Acute pyelonephritis 03/18/2022   Elevated troponin 03/18/2022   Chronic diastolic CHF (congestive heart failure) (HCC) 03/18/2022   Physical debility 09/17/2021   Acute right hip pain 09/16/2021   HLD (hyperlipidemia) 09/15/2021   COVID-19 virus infection 09/15/2021   SIRS (systemic inflammatory response syndrome) (HCC) 03/11/2021   Malnutrition of moderate degree 02/11/2021   Hyperkalemia 02/10/2021   Dyspnea 02/08/2021   Community acquired pneumonia of right upper lobe of lung    Respiratory failure with hypoxia and hypercapnia (HCC) 10/11/2018   Acute pain of left shoulder 10/15/2016   Pain in right leg 10/15/2016   Weakness of right leg 10/15/2016   Cardiomyopathy (HCC) 01/25/2016   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   Abnormal echocardiogram 12/13/2015   Syncope 12/12/2015   Tobacco use disorder 09/07/2015   Gout 01/27/2012   Cocaine use 12/22/2011   Chest pain 12/18/2008   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   Hypertension 04/14/2007   Asthma 04/14/2007   COPD (chronic obstructive pulmonary disease) (HCC) 04/14/2007   Chronic low back  pain with sciatica 04/14/2007   Seizures (HCC) 04/14/2007   PANCREATITIS, HX OF 04/14/2007    Orientation RESPIRATION BLADDER Height & Weight     Self, Time, Situation, Place  Normal External catheter, Continent Weight: 172 lb 13.5 oz (78.4 kg) Height:  5\' 9"  (175.3 cm)  BEHAVIORAL SYMPTOMS/MOOD NEUROLOGICAL BOWEL NUTRITION STATUS      Continent Diet (See dc summary)  AMBULATORY STATUS COMMUNICATION OF NEEDS Skin   Extensive Assist Verbally Normal                       Personal Care Assistance Level of Assistance  Bathing, Feeding, Dressing Bathing Assistance: Maximum assistance Feeding assistance: Independent Dressing Assistance: Maximum assistance     Functional Limitations Info  Sight, Hearing, Speech Sight Info: Adequate Hearing Info: Adequate Speech Info: Adequate    SPECIAL CARE FACTORS FREQUENCY  PT (By licensed PT), OT (By licensed OT)     PT Frequency: 5xweek OT Frequency: 5xweek            Contractures Contractures Info: Not present    Additional Factors Info  Code Status, Allergies Code Status Info: Full Allergies Info: NKA           Current Medications (01/06/2023):  This is the current hospital active medication list Current Facility-Administered Medications  Medication Dose Route Frequency Provider Last Rate Last Admin   0.9 %  sodium chloride infusion   Intravenous Continuous Zannie Cove, MD 75 mL/hr at 01/06/23 0915 New Bag at 01/06/23 0915   acetaminophen (TYLENOL) tablet 650 mg  650 mg Oral Q6H  PRN Synetta Fail, MD   650 mg at 01/06/23 1025   Or   acetaminophen (TYLENOL) suppository 650 mg  650 mg Rectal Q6H PRN Synetta Fail, MD       albuterol (PROVENTIL) (2.5 MG/3ML) 0.083% nebulizer solution 3 mL  3 mL Inhalation Q6H PRN Synetta Fail, MD       allopurinol (ZYLOPRIM) tablet 200 mg  200 mg Oral Daily Synetta Fail, MD   200 mg at 01/06/23 0901   ampicillin (PRINCIPEN) capsule 500 mg  500 mg Oral BID  Zannie Cove, MD   500 mg at 01/06/23 1249   aspirin EC tablet 81 mg  81 mg Oral Daily Synetta Fail, MD   81 mg at 01/06/23 0901   atorvastatin (LIPITOR) tablet 20 mg  20 mg Oral Daily Synetta Fail, MD   20 mg at 01/06/23 0902   enoxaparin (LOVENOX) injection 30 mg  30 mg Subcutaneous Q24H Zannie Cove, MD   30 mg at 01/05/23 2114   gabapentin (NEURONTIN) capsule 300 mg  300 mg Oral BID Zannie Cove, MD   300 mg at 01/06/23 0902   insulin aspart (novoLOG) injection 0-9 Units  0-9 Units Subcutaneous TID WC Synetta Fail, MD   2 Units at 01/06/23 1239   insulin aspart (novoLOG) injection 3 Units  3 Units Subcutaneous TID WC Zannie Cove, MD   3 Units at 01/06/23 1239   methocarbamol (ROBAXIN) tablet 500 mg  500 mg Oral TID Zannie Cove, MD   500 mg at 01/06/23 0902   mometasone-formoterol (DULERA) 200-5 MCG/ACT inhaler 2 puff  2 puff Inhalation BID Synetta Fail, MD   2 puff at 01/06/23 0901   pantoprazole (PROTONIX) EC tablet 40 mg  40 mg Oral Daily Synetta Fail, MD   40 mg at 01/06/23 0901   polyethylene glycol (MIRALAX / GLYCOLAX) packet 17 g  17 g Oral Daily PRN Synetta Fail, MD       predniSONE (DELTASONE) tablet 40 mg  40 mg Oral Q breakfast Zannie Cove, MD   40 mg at 01/06/23 0902   sodium chloride flush (NS) 0.9 % injection 3 mL  3 mL Intravenous Q12H Synetta Fail, MD   3 mL at 01/06/23 0902     Discharge Medications: Please see discharge summary for a list of discharge medications.  Relevant Imaging Results:  Relevant Lab Results:   Additional Information SSN:[id number]  Oletta Lamas, MSW, LCSWA, LCASA Transitions of Care  Clinical Social Worker I

## 2023-01-06 NOTE — Progress Notes (Addendum)
PROGRESS NOTE    Luis Carter  OZH:[id number] DOB: 07/24/1946 DOA: 01/03/2023 PCP: Fleet Contras, MD  77/M with history of chronic diastolic CHF, COPD, CKD 3B, seizures, hypertension, dyslipidemia, gout, chronic back pain presented to the ED yesterday with shortness of breath and neck pain.,  Ongoing cough, headache, chest tightness wheezing and orthopnea along with some lower extremity edema for 1 to 2 days. -In the ED he was hypertensive, hypoxic requiring 2 L O2, labs noted BNP of 381, creatinine of 1.5, WBC of 11.6, chest x-ray was unremarkable, CT head and neck nonacute, with CTA neck noted proximal left ICA 70% stenosis. -Admitted, started on nebs, steroids for COPD exacerbation -Also given IV Lasix for trace edema, immediately discontinued w/ uptrending creatinine -Complicated by AKI -Called by urgent care 5/14, urine culture drawn at urgent care 5/11 prior to admission growing Enterococcus faecalis  Subjective: -Feels better, breathing much improved, continues to have some left knee pain  Assessment and Plan:  Acute hypoxic respiratory failure Acute on chronic diastolic CHF -Suspect multifactorial respiratory failure, combination of COPD with exacerbation and ?  Mild CHF -Echo with EF 60%, grade 1 diastolic dysfunction, mildly reduced RV -Lasix and Jardiance discontinued given significant uptrend in creatinine, remains euvolemic -Continue DuoNebs, prednisone  COPD exacerbation -Long-term smoker, quit few years ago -Had few expiratory wheezes yesterday with poor air movement, started on prednisone, continue DuoNebs, slowly improving -Continue Dulera, wean off O2  AKI on CKD 3b -Baseline creatinine around 1.6-2, then 2.5> 3.1 today -Suspect hemodynamically mediated, blood pressure soft on 5/13 after diuretics -Lasix and Jardiance were discontinued yesterday, now appears a little dry, sodium trending down, will add IVF X 12 hours -Check renal ultrasound, avoid hypotension,  amlodipine discontinued -BMP in a.m.  Enterococcus faecalis UTI -From cultures drawn at urgent care 5/11 prior to admission -Start ampicillin  Left knee pain -Chronic, severe osteoarthritis noted on x-ray, continue Tylenol, Robaxin, physical therapy   Neck pain -Likely musculoskeletal, continue Robaxin, physical therapy  Carotid artery disease > CT did show proximal left ICA stenosis of 70% and moderate to severe atherosclerotic narrowing of the left vertebral artery. -Continue aspirin and statin   Hypertension -BP soft will hold amlodipine today   Hyperlipidemia - Continue home atorvastatin   Gout - Continue home allopurinol   Diabetes - SSI   PPI   DVT prophylaxis:      Lovenox Code Status:              Full Family Communication:       None on admission Disposition Plan: SNF for short-term rehab, TOC consulted Consultants:    Procedures:   Antimicrobials:    Objective: Vitals:   01/06/23 0355 01/06/23 0721 01/06/23 0900 01/06/23 1108  BP: 105/62 123/64  132/60  Pulse: (!) 58 65  63  Resp: 14 20  17   Temp: 97.8 F (36.6 C) (!) 97.3 F (36.3 C)  (!) 97.3 F (36.3 C)  TempSrc: Oral Oral  Oral  SpO2: 100% 100% 100% 96%  Weight:      Height:        Intake/Output Summary (Last 24 hours) at 01/06/2023 1150 Last data filed at 01/05/2023 1613 Gross per 24 hour  Intake --  Output 575 ml  Net -575 ml   Filed Weights   01/04/23 0357 01/05/23 0045 01/05/23 0401  Weight: 80 kg 78.4 kg 78.4 kg    Examination:  General exam: Elderly chronically ill male sitting up in bed, AAOx3, appears more comfortable  today HEENT: No JVD CVS: S1-S2, regular rhythm Lungs: Improving air movement, rare expiratory wheezes in the upper lobes Abdomen: Soft, nontender, bowel sounds present  Extremities: No edema, mild swelling of left knee Skin: No rashes Psychiatry:  Mood & affect appropriate.     Data Reviewed:   CBC: Recent Labs  Lab 01/03/23 1505 01/03/23 1521  01/04/23 0100 01/05/23 0040  WBC 11.6*  --  9.4 8.2  NEUTROABS 7.8*  --   --   --   HGB 11.4* 12.6* 11.3* 11.0*  HCT 36.6* 37.0* 34.9* 34.2*  MCV 84.7  --  81.9 83.4  PLT 227  --  230 248   Basic Metabolic Panel: Recent Labs  Lab 01/03/23 1505 01/03/23 1521 01/04/23 0100 01/05/23 0036 01/05/23 0040 01/06/23 0046  NA 136 139 135  --  133* 129*  K 3.7 3.6 3.5  --  3.4* 4.4  CL 104 107 100  --  98 97*  CO2 20*  --  22  --  21* 21*  GLUCOSE 89 88 101*  --  145* 311*  BUN 22 21 22   --  34* 46*  CREATININE 1.59* 1.60* 1.72*  --  2.56* 3.11*  CALCIUM 8.3*  --  8.0*  --  7.8* 8.1*  MG 1.5*  --   --  1.6*  --  2.4   GFR: Estimated Creatinine Clearance: 19.3 mL/min (A) (by C-G formula based on SCr of 3.11 mg/dL (H)). Liver Function Tests: Recent Labs  Lab 01/03/23 1505 01/04/23 0100 01/05/23 0040  AST 29 24 22   ALT 13 13 9   ALKPHOS 84 78 73  BILITOT 1.3* 1.3* 0.9  PROT 7.1 6.8 6.7  ALBUMIN 3.4* 3.2* 3.0*   No results for input(s): "LIPASE", "AMYLASE" in the last 168 hours. No results for input(s): "AMMONIA" in the last 168 hours. Coagulation Profile: No results for input(s): "INR", "PROTIME" in the last 168 hours. Cardiac Enzymes: No results for input(s): "CKTOTAL", "CKMB", "CKMBINDEX", "TROPONINI" in the last 168 hours. BNP (last 3 results) No results for input(s): "PROBNP" in the last 8760 hours. HbA1C: No results for input(s): "HGBA1C" in the last 72 hours. CBG: Recent Labs  Lab 01/05/23 1121 01/05/23 1609 01/05/23 2113 01/06/23 0647 01/06/23 1105  GLUCAP 140* 210* 325* 196* 194*   Lipid Profile: No results for input(s): "CHOL", "HDL", "LDLCALC", "TRIG", "CHOLHDL", "LDLDIRECT" in the last 72 hours. Thyroid Function Tests: No results for input(s): "TSH", "T4TOTAL", "FREET4", "T3FREE", "THYROIDAB" in the last 72 hours. Anemia Panel: No results for input(s): "VITAMINB12", "FOLATE", "FERRITIN", "TIBC", "IRON", "RETICCTPCT" in the last 72 hours. Urine  analysis:    Component Value Date/Time   COLORURINE YELLOW 01/03/2023 1411   APPEARANCEUR CLEAR 01/03/2023 1411   LABSPEC 1.015 01/03/2023 1411   PHURINE 6.0 01/03/2023 1411   GLUCOSEU NEGATIVE 01/03/2023 1411   HGBUR SMALL (A) 01/03/2023 1411   BILIRUBINUR NEGATIVE 01/03/2023 1411   BILIRUBINUR negative 01/03/2023 1321   KETONESUR 5 (A) 01/03/2023 1411   KETONESUR trace (5) (A) 01/03/2023 1321   PROTEINUR 100 (A) 01/03/2023 1411   PROTEINUR =100 (A) 01/03/2023 1321   UROBILINOGEN 1.0 01/03/2023 1321   UROBILINOGEN 1.0 02/07/2022 1100   NITRITE NEGATIVE 01/03/2023 1411   NITRITE Negative 01/03/2023 1321   LEUKOCYTESUR TRACE (A) 01/03/2023 1411   LEUKOCYTESUR Trace (A) 01/03/2023 1321   Sepsis Labs: @LABRCNTIP (procalcitonin:4,lacticidven:4)  ) Recent Results (from the past 240 hour(s))  Urine Culture     Status: Abnormal   Collection Time: 01/03/23  1:38  PM   Specimen: Urine, Clean Catch  Result Value Ref Range Status   Specimen Description URINE, CLEAN CATCH  Final   Special Requests   Final    NONE Performed at [facility], [address], [place], [place] [address]    Culture >=100,000 COLONIES/mL ENTEROCOCCUS FAECALIS (A)  Final   Report Status 01/05/2023 FINAL  Final   Organism ID, Bacteria ENTEROCOCCUS FAECALIS (A)  Final      Susceptibility   Enterococcus faecalis - MIC*    AMPICILLIN <=2 SENSITIVE Sensitive     NITROFURANTOIN <=16 SENSITIVE Sensitive     VANCOMYCIN 1 SENSITIVE Sensitive     * >=100,000 COLONIES/mL ENTEROCOCCUS FAECALIS  SARS Coronavirus 2 by RT PCR (hospital order, performed in [facility] hospital lab) *cepheid single result test* Anterior Nasal Swab     Status: None   Collection Time: 01/03/23  3:05 PM   Specimen: Anterior Nasal Swab  Result Value Ref Range Status   SARS Coronavirus 2 by RT PCR NEGATIVE NEGATIVE Final    Comment: Performed at [facility], [address] [place], [place] [address]     Radiology Studies: US  RENAL  Result Date: 01/16/2023 CLINICAL DATA:  Acute kidney injury. History of diabetes and hypertension. EXAM: RENAL / URINARY TRACT ULTRASOUND COMPLETE COMPARISON:  Abdominal CT 03/17/2022. FINDINGS: Right Kidney: Renal measurements: 5.6 x 3.2 x 4.1 cm = volume: 38.2 mL. There is mild renal cortical thinning and increased cortical echogenicity. There is a simple appearing cyst in the upper pole which measures up to 3.0 cm in diameter, similar to previous CT. No suspicious renal lesion or hydronephrosis. Left Kidney: Renal measurements: 9.1 x 8.5 x 5.1 cm = volume: 157.8 mL. Echogenicity within normal limits. No mass or hydronephrosis visualized. Bladder: Appears normal for degree of bladder distention. Other: Ureteral jets were not visualized. Study is mildly limited by bowel gas and body habitus. Dimensions of the right kidney may be mildly underestimated. IMPRESSION: 1. No acute process identified. No hydronephrosis. 2. Right renal atrophy with increased cortical echogenicity and cortical thinning. 3. Simple appearing right renal cyst. Electronically Signed   By: Carey Bullocks M.D.   On: 2023/01/16 09:08   ECHOCARDIOGRAM COMPLETE  Result Date: 01/04/2023    ECHOCARDIOGRAM REPORT   Patient Name:   Luis OSSMAN Date of Exam: 01/04/2023 Medical Rec #:  [id number]          Height:       69.0 in Accession #:    [id number]         Weight:       176.4 lb Date of Birth:  1945-09-06           BSA:          1.958 m Patient Age:    77 years           BP:           110/60 mmHg Patient Gender: M                  HR:           74 bpm. Exam Location:  Inpatient Procedure: 2D Echo, Cardiac Doppler, Color Doppler and Intracardiac            Opacification Agent Indications:    CHF-Acute Diastolic I50.31  History:        Patient has prior history of Echocardiogram examinations, most  recent 03/18/2022. Cardiomyopathy, COPD and CKD III; Risk                 Factors:Hypertension, Former Smoker, Diabetes and  Dyslipidemia.  Sonographer:    Dondra Prader RVT RCS Referring Phys: [id number] Cecille Po MELVIN IMPRESSIONS  1. Left ventricular ejection fraction, by estimation, is 60 to 65%. The left ventricle has normal function. The left ventricle has no regional wall motion abnormalities. There is mild left ventricular hypertrophy. Left ventricular diastolic parameters are consistent with Grade I diastolic dysfunction (impaired relaxation).  2. Right ventricular systolic function is mildly reduced. The right ventricular size is normal. There is normal pulmonary artery systolic pressure. The estimated right ventricular systolic pressure is 12.9 mmHg.  3. The mitral valve is normal in structure. No evidence of mitral valve regurgitation.  4. The aortic valve is tricuspid. There is mild calcification of the aortic valve. Aortic valve regurgitation is not visualized.  5. Aneurysm of the ascending aorta, measuring 41 mm.  6. The inferior vena cava is normal in size with greater than 50% respiratory variability, suggesting right atrial pressure of 3 mmHg. FINDINGS  Left Ventricle: Left ventricular ejection fraction, by estimation, is 60 to 65%. The left ventricle has normal function. The left ventricle has no regional wall motion abnormalities. The left ventricular internal cavity size was normal in size. There is  mild left ventricular hypertrophy. Left ventricular diastolic parameters are consistent with Grade I diastolic dysfunction (impaired relaxation). Right Ventricle: The right ventricular size is normal. Right ventricular systolic function is mildly reduced. There is normal pulmonary artery systolic pressure. The tricuspid regurgitant velocity is 1.57 m/s, and with an assumed right atrial pressure of  3 mmHg, the estimated right ventricular systolic pressure is 12.9 mmHg. Left Atrium: Left atrial size was normal in size. Right Atrium: Right atrial size was normal in size. Pericardium: There is no evidence of pericardial  effusion. Mitral Valve: The mitral valve is normal in structure. No evidence of mitral valve regurgitation. Tricuspid Valve: Tricuspid valve regurgitation is trivial. Aortic Valve: The aortic valve is tricuspid. There is mild calcification of the aortic valve. Aortic valve regurgitation is not visualized. Aortic valve mean gradient measures 3.0 mmHg. Aortic valve peak gradient measures 5.9 mmHg. Aortic valve area, by VTI measures 2.81 cm. Pulmonic Valve: Pulmonic valve regurgitation is not visualized. Aorta: There is an aneurysm involving the ascending aorta measuring 41 mm. Venous: The inferior vena cava is normal in size with greater than 50% respiratory variability, suggesting right atrial pressure of 3 mmHg. IAS/Shunts: The interatrial septum was not well visualized.  LEFT VENTRICLE PLAX 2D LVIDd:         4.40 cm   Diastology LVIDs:         2.20 cm   LV e' medial:    3.30 cm/s LV PW:         1.00 cm   LV E/e' medial:  14.3 LV IVS:        0.90 cm   LV e' lateral:   6.60 cm/s LVOT diam:     2.00 cm   LV E/e' lateral: 7.2 LV SV:         50 LV SV Index:   26 LVOT Area:     3.14 cm  RIGHT VENTRICLE RV Basal diam:  2.90 cm RV S prime:     7.93 cm/s TAPSE (M-mode): 1.3 cm LEFT ATRIUM             Index  RIGHT ATRIUM           Index LA diam:        3.30 cm 1.68 cm/m   RA Area:     13.60 cm LA Vol (A2C):   37.0 ml 18.89 ml/m  RA Volume:   27.00 ml  13.79 ml/m LA Vol (A4C):   29.4 ml 14.99 ml/m LA Biplane Vol: 36.7 ml 18.74 ml/m  AORTIC VALVE                    PULMONIC VALVE AV Area (Vmax):    2.48 cm     PV Vmax:       1.13 m/s AV Area (Vmean):   2.32 cm     PV Peak grad:  5.1 mmHg AV Area (VTI):     2.81 cm AV Vmax:           121.00 cm/s AV Vmean:          79.400 cm/s AV VTI:            0.178 m AV Peak Grad:      5.9 mmHg AV Mean Grad:      3.0 mmHg LVOT Vmax:         95.70 cm/s LVOT Vmean:        58.700 cm/s LVOT VTI:          0.159 m LVOT/AV VTI ratio: 0.89  AORTA Ao Root diam: 3.00 cm Ao Asc diam:   4.10 cm MITRAL VALVE               TRICUSPID VALVE MV Area (PHT): 2.00 cm    TR Peak grad:   9.9 mmHg MV Decel Time: 380 msec    TR Vmax:        157.00 cm/s MV E velocity: 47.20 cm/s MV A velocity: 80.00 cm/s  SHUNTS MV E/A ratio:  0.59        Systemic VTI:  0.16 m                            Systemic Diam: 2.00 cm Carolan Clines Electronically signed by Carolan Clines Signature Date/Time: 01/04/2023/1:45:26 PM    Final      Scheduled Meds:  allopurinol  200 mg Oral Daily   aspirin EC  81 mg Oral Daily   atorvastatin  20 mg Oral Daily   enoxaparin (LOVENOX) injection  30 mg Subcutaneous Q24H   gabapentin  300 mg Oral BID   insulin aspart  0-9 Units Subcutaneous TID WC   insulin aspart  3 Units Subcutaneous TID WC   methocarbamol  500 mg Oral TID   mometasone-formoterol  2 puff Inhalation BID   pantoprazole  40 mg Oral Daily   predniSONE  40 mg Oral Q breakfast   sodium chloride flush  3 mL Intravenous Q12H   Continuous Infusions:  sodium chloride 75 mL/hr at 01/06/23 0915     LOS: 2 days    Time spent:    Zannie Cove, MD [facility]   01/06/2023, 11:50 AM

## 2023-01-07 DIAGNOSIS — E1169 Type 2 diabetes mellitus with other specified complication: Secondary | ICD-10-CM

## 2023-01-07 DIAGNOSIS — I5033 Acute on chronic diastolic (congestive) heart failure: Secondary | ICD-10-CM | POA: Diagnosis not present

## 2023-01-07 DIAGNOSIS — G8929 Other chronic pain: Secondary | ICD-10-CM

## 2023-01-07 DIAGNOSIS — N390 Urinary tract infection, site not specified: Secondary | ICD-10-CM

## 2023-01-07 DIAGNOSIS — M5440 Lumbago with sciatica, unspecified side: Secondary | ICD-10-CM | POA: Diagnosis not present

## 2023-01-07 DIAGNOSIS — J449 Chronic obstructive pulmonary disease, unspecified: Secondary | ICD-10-CM | POA: Diagnosis not present

## 2023-01-07 DIAGNOSIS — N1832 Chronic kidney disease, stage 3b: Secondary | ICD-10-CM | POA: Diagnosis not present

## 2023-01-07 LAB — BASIC METABOLIC PANEL
Anion gap: 11 (ref 5–15)
BUN: 56 mg/dL — ABNORMAL HIGH (ref 8–23)
CO2: 20 mmol/L — ABNORMAL LOW (ref 22–32)
Calcium: 8.2 mg/dL — ABNORMAL LOW (ref 8.9–10.3)
Chloride: 100 mmol/L (ref 98–111)
Creatinine, Ser: 2.33 mg/dL — ABNORMAL HIGH (ref 0.61–1.24)
GFR, Estimated: 28 mL/min — ABNORMAL LOW (ref >60)
Glucose, Bld: 216 mg/dL — ABNORMAL HIGH (ref 70–99)
Potassium: 4.5 mmol/L (ref 3.5–5.1)
Sodium: 131 mmol/L — ABNORMAL LOW (ref 135–145)

## 2023-01-07 LAB — GLUCOSE, CAPILLARY
Glucose-Capillary: 162 mg/dL — ABNORMAL HIGH (ref 70–99)
Glucose-Capillary: 217 mg/dL — ABNORMAL HIGH (ref 70–99)
Glucose-Capillary: 240 mg/dL — ABNORMAL HIGH (ref 70–99)
Glucose-Capillary: 212 mg/dL — ABNORMAL HIGH (ref 70–99)

## 2023-01-07 LAB — CBC
HCT: 34.8 % — ABNORMAL LOW (ref 39.0–52.0)
Hemoglobin: 11.1 g/dL — ABNORMAL LOW (ref 13.0–17.0)
MCH: 26.3 pg (ref 26.0–34.0)
MCHC: 31.9 g/dL (ref 30.0–36.0)
MCV: 82.5 fL (ref 80.0–100.0)
Platelets: 316 10*3/uL (ref 150–400)
RBC: 4.22 MIL/uL (ref 4.22–5.81)
RDW: 15.3 % (ref 11.5–15.5)
WBC: 11.8 10*3/uL — ABNORMAL HIGH (ref 4.0–10.5)
nRBC: 0 % (ref 0.0–0.2)

## 2023-01-07 NOTE — Assessment & Plan Note (Addendum)
Echocardiogram with preserved LV systolic function EF 60 to 65%, mid LVH, RV systolic function with mild reduction, RVSP 12.9 mmHg. No significant valvular disease.   Patient was placed on furosemide with improvement in volume status, at the time of his discharge his fluid balance is negative -5,577 ml.   Plan to continue blood pressure monitoring.  Continue with lisinopril.

## 2023-01-07 NOTE — Assessment & Plan Note (Addendum)
Urine culture from outside hospital positive for Enterococcus fecalis.  Antibiotic therapy with ampicillin for 3 days.

## 2023-01-07 NOTE — Progress Notes (Signed)
Physical Therapy Treatment Patient Details Name: Luis Carter MRN: [id number] DOB: 1945-10-22 Today's Date: 01/07/2023   History of Present Illness Pt is a 77 y.o. male who presented 01/03/23 with SOB and neck pain. Admitted with acute on chronic diastolic CHF. PMH: CKD 3B, seizures, HTN, HLD, gout, low back pain, DM, COPD, diastolic CHF, asthma    PT Comments    Pt tolerated treatment well today. Pt was able to ambulate in hallway today with RW and navigate stairs at supervision level. Pt has met all acute PT goals and PT will be signing off. DC recs updated to HHPT. Re consult PT if mobility status changes.    Recommendations for follow up therapy are one component of a multi-disciplinary discharge planning process, led by the attending physician.  Recommendations may be updated based on patient status, additional functional criteria and insurance authorization.  Follow Up Recommendations  Can patient physically be transported by private vehicle: Yes    Assistance Recommended at Discharge Intermittent Supervision/Assistance  Patient can return home with the following A little help with walking and/or transfers;A little help with bathing/dressing/bathroom;Assistance with cooking/housework;Direct supervision/assist for medications management;Assist for transportation;Help with stairs or ramp for entrance   Equipment Recommendations  None recommended by PT    Recommendations for Other Services       Precautions / Restrictions Precautions Precautions: Fall;Other (comment) Precaution Comments: watch SpO2 and BP Restrictions Weight Bearing Restrictions: No     Mobility  Bed Mobility Overal bed mobility: Modified Independent                  Transfers Overall transfer level: Independent Equipment used: None Transfers: Sit to/from Stand                  Ambulation/Gait Ambulation/Gait assistance: Chief Operating Officer (Feet): 400 Feet (Chair  follow.) Assistive device: Rolling walker (2 wheels) Gait Pattern/deviations: Decreased step length - right, Decreased step length - left, Decreased stride length, Trunk flexed Gait velocity: reduced     General Gait Details: No LOB noted. 1 prolonged seated rest break with chair follow for safety given previous event.   Stairs Stairs: Yes Stairs assistance: Supervision Stair Management: One rail Right, Alternating pattern, Forwards Number of Stairs: 10 General stair comments: no LOB noted   Wheelchair Mobility    Modified Rankin (Stroke Patients Only)       Balance Overall balance assessment: Mild deficits observed, not formally tested                                          Cognition Arousal/Alertness: Awake/alert Behavior During Therapy: WFL for tasks assessed/performed Overall Cognitive Status: Within Functional Limits for tasks assessed                                          Exercises      General Comments General comments (skin integrity, edema, etc.): VSS on RA      Pertinent Vitals/Pain Pain Assessment Pain Assessment: No/denies pain    Home Living                          Prior Function            PT Goals (current goals can now be  found in the care plan section) Progress towards PT goals: Goals met/education completed, patient discharged from PT    Frequency    Min 1X/week      PT Plan Discharge plan needs to be updated    Co-evaluation              AM-PAC PT "6 Clicks" Mobility   Outcome Measure  Help needed turning from your back to your side while in a flat bed without using bedrails?: None Help needed moving from lying on your back to sitting on the side of a flat bed without using bedrails?: None Help needed moving to and from a bed to a chair (including a wheelchair)?: A Little Help needed standing up from a chair using your arms (e.g., wheelchair or bedside chair)?:  None Help needed to walk in hospital room?: A Little Help needed climbing 3-5 steps with a railing? : A Little 6 Click Score: 21    End of Session Equipment Utilized During Treatment: Gait belt Activity Tolerance: Patient tolerated treatment well Patient left: in chair;with call bell/phone within reach Nurse Communication: Mobility status PT Visit Diagnosis: Unsteadiness on feet (R26.81);Other abnormalities of gait and mobility (R26.89);Muscle weakness (generalized) (M62.81);Difficulty in walking, not elsewhere classified (R26.2);Pain     Time: 1610-9604 PT Time Calculation (min) (ACUTE ONLY): 20 min  Charges:  $Gait Training: 8-22 mins                     Shela Nevin, PT, DPT Acute Rehab Services [phone]    [name] 01/07/2023, 3:30 PM

## 2023-01-07 NOTE — Assessment & Plan Note (Addendum)
Acute exacerbation.   Dyspnea continue to improve.  Patient was placed on dulera bid, bronchodilators and systemic corticosteroids with good toleration.  Airway clearing techniques with flutter valve and incentive spirometer.   At the time of his discharge his 02 saturation was 98% on room air. Patient will be discharge to SNF to continue physical therapy.

## 2023-01-07 NOTE — Assessment & Plan Note (Addendum)
Uncontrolled T2DM with hyperglycemia.  Fasting glucose today is 199 mg/dl.   Patient was placed on insulin sliding scale, basal and pre meal insulin. Likely hyperglycemia due to steroids.  Prednisone will be decreased to 20 mg daily for 3 more days. Resumed metformin.

## 2023-01-07 NOTE — Progress Notes (Signed)
Progress Note   Patient: Luis Carter ZOX:[id number] DOB: 1946-06-27 DOA: 01/03/2023     3 DOS: the patient was seen and examined on 01/07/2023   Brief hospital course: Mr. Yeakey was admitted to the hospital with the working diagnosis of COPD exacerbation.   77 yo male with the past medical history of CKD stage rb, hypertension, dyslipidemia, chronic back pain, T2DM, COPD and heart failure who presented with dyspnea and neck pain. Reported dyspnea for couple of days, with positive dry cough. Patient was evaluated at urgent care and was found hypoxemic, he was placed on supplemental 02 per [place], 2 L/min and referred to the ED. On his initial physical examination his blood pressure was 177/86, RR 17 and HR 82, 02 saturation 92% on supplemental 02 per [place], lungs with wheezing, heart with S1 and S2 present and rhythmic, with no gallops, abdomen with no distention and no lower extremity edema, right neck tender to palpation.   Sars covid 19 negative.   Head and neck CT/ angio with no acute changes,  Chest radiograph with hyperinflation.   EKG 85 bpm, normal axis, normal intervals, sinus rhythm with no significant ST segment or T wave changes.   Admitted, started on nebs, steroids for COPD exacerbation -Also given IV Lasix for trace edema, immediately discontinued w/ uptrending creatinine -Complicated by AKI -Called by urgent care 5/14, urine culture drawn at urgent care 5/11 prior to admission growing Enterococcus faecalis  Assessment and Plan: * Acute on chronic diastolic CHF (congestive heart failure) (HCC) Echocardiogram with preserved LV systolic function EF 60 to 65%, mid LVH, RV systolic function with mild reduction, RVSP 12.9 mmHg. No significant valvular disease.   Volume status has improved.  Urine output is 1,250 ml  Plan to continue blood pressure monitoring Diuretic therapy on hold.   COPD (chronic obstructive pulmonary disease) (HCC) Acute exacerbation.   Dyspnea  continue to improve. Continue with dulera bid and oral prednisone 40 mg po daily.  Airway clearing techniques with flutter valve and incentive spirometer.  Out of bed to chair tid with meals, PT and OT. Plan to transfer to SNF.   Stage 3b chronic kidney disease (CKD) (HCC) AKI, hypokalemia.   Renal function with serum cr at 2,33 with K at 4,5 and serum bicarbonate at 20.  Na 131 (glucose 216).   Plan to continue holding diuretic therapy for now.  Follow up renal function in am.   Chronic low back pain with sciatica Continue pain control, Pt and Ot.  Transfer to SNF for rehabilitation.   Type 2 diabetes mellitus with hyperlipidemia (HCC) Uncontrolled T2DM with hyperglycemia.  Fasting glucose today is 216,  Plan to continue insulin sliding scale for glucose cover and monitoring.   Gout No acute flare.   Hypertension Continue blood pressure monitoring.  At home on amlodipine.   Seizures (HCC) No active seizures.   UTI (urinary tract infection) Urine culture from outside hospital positive for Enterococcus fecalis.  Plan to continue antibiotic therapy with ampicillin for 3 days.         Subjective: Patient with improvement in dyspnea, no cough or chest pain, no lower extremity edema.  Continue very weak and deconditioned.   Physical Exam: Vitals:   01/07/23 0731 01/07/23 0747 01/07/23 1128 01/07/23 1221  BP:  121/75 (!) 110/59   Pulse:  62 79   Resp:  18 17   Temp:  97.6 F (36.4 C)  97.7 F (36.5 C)  TempSrc:  Oral  Oral  SpO2: 99% 100% 100%   Weight:      Height:       Neurology awake and alert ENT with mild pallor Cardiovascular with S1 and S2 present and rhythmic with no gallops. Positive murmur at the right lower sternal border No JVD No lower extremity edema Respiratory with prolonged expiratory phase with no wheezing, or rhonchi, no rales  Abdomen with no distention  Data Reviewed:    Family Communication: no family at the bedside    Disposition: Status is: Inpatient Remains inpatient appropriate because: COPD exacerbation   Planned Discharge Destination: Skilled nursing facility tomorrow       Author: Coralie Keens, MD 01/07/2023 1:26 PM  For on call review [email or web address].

## 2023-01-07 NOTE — Assessment & Plan Note (Addendum)
AKI, hypokalemia.   At the time of his discharge his renal function has been improving, serum cr at 2,18, with K at 4,6 and serum bicarbonate at 23.  Na 131.   Will continue ace inh at a lower dose of 10 mg daily and plan to check renal function in 7 days.

## 2023-01-07 NOTE — Assessment & Plan Note (Signed)
No acute flare.  

## 2023-01-07 NOTE — Assessment & Plan Note (Signed)
No active seizures.  Continue with keppra, valproic acid and lacosamide.  

## 2023-01-07 NOTE — Hospital Course (Signed)
Luis Carter was admitted to the hospital with the working diagnosis of COPD exacerbation.   77 yo male with the past medical history of CKD stage rb, hypertension, dyslipidemia, chronic back pain, T2DM, COPD and heart failure who presented with dyspnea and neck pain. Reported dyspnea for couple of days, with positive dry cough. Patient was evaluated at urgent care and was found hypoxemic, he was placed on supplemental 02 per [place], 2 L/min and referred to the ED. On his initial physical examination his blood pressure was 177/86, RR 17 and HR 82, 02 saturation 92% on supplemental 02 per [place], lungs with wheezing, heart with S1 and S2 present and rhythmic, with no gallops, abdomen with no distention and no lower extremity edema, right neck tender to palpation.   Sars covid 19 negative.   Head and neck CT/ angio with no acute changes,  Chest radiograph with hyperinflation.   EKG 85 bpm, normal axis, normal intervals, sinus rhythm with no significant ST segment or T wave changes.   Admitted, started on nebs, steroids for COPD exacerbation -Also given IV Lasix for trace edema, immediately discontinued w/ uptrending creatinine -Complicated by AKI -Called by urgent care 5/14, urine culture drawn at urgent care 5/11 prior to admission growing Enterococcus faecalis

## 2023-01-07 NOTE — Care Management Important Message (Signed)
Important Message  Patient Details  Name: Luis Carter MRN: [id number] Date of Birth: 1946-07-19   Medicare Important Message Given:  Yes     Marek Nghiem Stefan Church 01/07/2023, 3:34 PM

## 2023-01-07 NOTE — Assessment & Plan Note (Signed)
Continue pain control, Pt and Ot.  Transfer to SNF for rehabilitation.

## 2023-01-07 NOTE — Assessment & Plan Note (Addendum)
Continue blood pressure monitoring.  Continue amlodipine and lower dose of lisinopril.

## 2023-01-08 DIAGNOSIS — R569 Unspecified convulsions: Secondary | ICD-10-CM

## 2023-01-08 DIAGNOSIS — N3000 Acute cystitis without hematuria: Secondary | ICD-10-CM

## 2023-01-08 DIAGNOSIS — M1000 Idiopathic gout, unspecified site: Secondary | ICD-10-CM | POA: Diagnosis not present

## 2023-01-08 DIAGNOSIS — I5033 Acute on chronic diastolic (congestive) heart failure: Secondary | ICD-10-CM | POA: Diagnosis not present

## 2023-01-08 DIAGNOSIS — E1169 Type 2 diabetes mellitus with other specified complication: Secondary | ICD-10-CM | POA: Diagnosis not present

## 2023-01-08 DIAGNOSIS — J449 Chronic obstructive pulmonary disease, unspecified: Secondary | ICD-10-CM | POA: Diagnosis not present

## 2023-01-08 LAB — BASIC METABOLIC PANEL
Anion gap: 10 (ref 5–15)
BUN: 56 mg/dL — ABNORMAL HIGH (ref 8–23)
CO2: 23 mmol/L (ref 22–32)
Calcium: 8 mg/dL — ABNORMAL LOW (ref 8.9–10.3)
Chloride: 98 mmol/L (ref 98–111)
Creatinine, Ser: 2.18 mg/dL — ABNORMAL HIGH (ref 0.61–1.24)
GFR, Estimated: 30 mL/min — ABNORMAL LOW (ref >60)
Glucose, Bld: 323 mg/dL — ABNORMAL HIGH (ref 70–99)
Potassium: 4.6 mmol/L (ref 3.5–5.1)
Sodium: 131 mmol/L — ABNORMAL LOW (ref 135–145)

## 2023-01-08 LAB — GLUCOSE, CAPILLARY
Glucose-Capillary: 236 mg/dL — ABNORMAL HIGH (ref 70–99)
Glucose-Capillary: 237 mg/dL — ABNORMAL HIGH (ref 70–99)
Glucose-Capillary: 230 mg/dL — ABNORMAL HIGH (ref 70–99)
Glucose-Capillary: 323 mg/dL — ABNORMAL HIGH (ref 70–99)

## 2023-01-08 MED ORDER — PREDNISONE 20 MG PO TABS
20.0000 mg | ORAL_TABLET | Freq: Every day | ORAL | Status: DC
  Administered 2023-01-09: 20 mg via ORAL
  Filled 2023-01-08: qty 1

## 2023-01-08 MED ORDER — AMLODIPINE BESYLATE 5 MG PO TABS
5.0000 mg | ORAL_TABLET | Freq: Every day | ORAL | Status: DC
  Administered 2023-01-08 – 2023-01-09 (×2): 5 mg via ORAL
  Filled 2023-01-08 (×2): qty 1

## 2023-01-08 MED ORDER — INSULIN GLARGINE-YFGN 100 UNIT/ML ~~LOC~~ SOLN
5.0000 [IU] | Freq: Every day | SUBCUTANEOUS | Status: DC
  Administered 2023-01-08 – 2023-01-09 (×2): 5 [IU] via SUBCUTANEOUS
  Filled 2023-01-08 (×2): qty 0.05

## 2023-01-08 MED ORDER — METFORMIN HCL 500 MG PO TABS
500.0000 mg | ORAL_TABLET | Freq: Two times a day (BID) | ORAL | Status: DC
  Administered 2023-01-08 – 2023-01-09 (×2): 500 mg via ORAL
  Filled 2023-01-08 (×2): qty 1

## 2023-01-08 NOTE — Plan of Care (Signed)

## 2023-01-08 NOTE — Progress Notes (Signed)
Occupational Therapy Treatment Patient Details Name: Luis Carter MRN: [id number] DOB: Oct 02, 1945 Today's Date: 01/08/2023   History of present illness Pt is a 77 y.o. male who presented 01/03/23 with SOB and neck pain. Admitted with acute on chronic diastolic CHF. PMH: CKD 3B, seizures, HTN, HLD, gout, low back pain, DM, COPD, diastolic CHF, asthma   OT comments  Pt continuing to progress towards pt focused goals, Pt now completing functional mobility and bADLs with supervision + RW and is more cognitively intact than IE presentation. Pt continues to report mild neck pain with cervical rotation, no trigger points noted. Discussed with pt the use of AAROM finger flexion and wrist ext for the time being to assist with occasional trigger finger of R4. Pt has no further acute skilled OT needs but will benefit from continued mobility with mobility specialists. Pt now more appropriate for [facility] OT services post acute to optimize functional independence in home environment.    Recommendations for follow up therapy are one component of a multi-disciplinary discharge planning process, led by the attending physician.  Recommendations may be updated based on patient status, additional functional criteria and insurance authorization.    Assistance Recommended at Discharge Frequent or constant Supervision/Assistance  Patient can return home with the following  Assistance with cooking/housework;Assist for transportation;Help with stairs or ramp for entrance   Equipment Recommendations  None recommended by OT    Recommendations for Other Services      Precautions / Restrictions Precautions Precautions: Fall;Other (comment) Precaution Comments: watch SpO2 and BP Restrictions Weight Bearing Restrictions: No       Mobility Bed Mobility Overal bed mobility: Modified Independent             General bed mobility comments: pt rec'd in bed and left sitting in recliner    Transfers Overall  transfer level: Needs assistance Equipment used: Rolling walker (2 wheels) Transfers: Sit to/from Stand Sit to Stand: Supervision                 Balance Overall balance assessment: Mild deficits observed, not formally tested                                         ADL either performed or assessed with clinical judgement   ADL       Grooming: Sitting;Supervision/safety;Wash/dry face           Upper Body Dressing : Sitting;Supervision/safety;Set up   Lower Body Dressing: Supervision/safety Lower Body Dressing Details (indicate cue type and reason): pt doffed/donned L sock while feet up in recliner     Toileting- Clothing Manipulation and Hygiene: Min guard;Sit to/from stand       Functional mobility during ADLs: Supervision/safety;Rolling walker (2 wheels)      Extremity/Trunk Assessment              Vision       Perception     Praxis      Cognition Arousal/Alertness: Awake/alert Behavior During Therapy: WFL for tasks assessed/performed Overall Cognitive Status: Within Functional Limits for tasks assessed                                          Exercises Other Exercises Other Exercises: Wrist ext Other Exercises: AAROM finger flexion    Shoulder Instructions  General Comments VSS on RA, HR up to 90 with functional ambulation. BP 100/87 supine in bed before session, Bp 145/83 while seated in recliner    Pertinent Vitals/ Pain       Pain Assessment Pain Assessment: Faces Faces Pain Scale: Hurts a little bit Pain Location: neck Pain Descriptors / Indicators: Discomfort, Grimacing, Guarding, Aching, Constant Pain Intervention(s): Limited activity within patient's tolerance, Monitored during session  Home Living                                          Prior Functioning/Environment              Frequency  Min 2X/week        Progress Toward Goals  OT Goals(current  goals can now be found in the care plan section)  Progress towards OT goals: Progressing toward goals  Acute Rehab OT Goals Patient Stated Goal: "Ready to go home" OT Goal Formulation: With patient Time For Goal Achievement: 01/19/23  Plan Discharge plan needs to be updated;Frequency remains appropriate    Co-evaluation                 AM-PAC OT "6 Clicks" Daily Activity     Outcome Measure   Help from another person eating meals?: None Help from another person taking care of personal grooming?: A Little Help from another person toileting, which includes using toliet, bedpan, or urinal?: A Little Help from another person bathing (including washing, rinsing, drying)?: A Little Help from another person to put on and taking off regular upper body clothing?: A Little Help from another person to put on and taking off regular lower body clothing?: A Little 6 Click Score: 19    End of Session Equipment Utilized During Treatment: Gait belt;Rolling walker (2 wheels)  OT Visit Diagnosis: Unsteadiness on feet (R26.81);Muscle weakness (generalized) (M62.81)   Activity Tolerance Patient tolerated treatment well   Patient Left in chair;with call bell/phone within reach;with chair alarm set   Nurse Communication Mobility status        Time: 1610-9604 OT Time Calculation (min): 29 min  Charges: OT General Charges $OT Visit: 1 Visit OT Treatments $Therapeutic Activity: 23-37 mins  01/08/2023  AB, OTR/L  Acute Rehabilitation Services  Office: [phone]   Tristan Schroeder 01/08/2023, 12:05 PM

## 2023-01-08 NOTE — Plan of Care (Signed)
  Problem: Acute Rehab OT Goals (only OT should resolve) Goal: OT Additional ADL Goal #1 Outcome: Completed/Met   Problem: Acute Rehab OT Goals (only OT should resolve) Goal: Pt. Will Transfer To Toilet Outcome: Completed/Met   Problem: Acute Rehab OT Goals (only OT should resolve) Goal: Pt. Will Perform Grooming Outcome: Completed/Met   Pt has met all functional OT goals. DC from OT services at this time.  01/08/2023  AB, OTR/L  Acute Rehabilitation Services  Office: [phone]

## 2023-01-08 NOTE — Progress Notes (Signed)
Progress Note   Patient: Luis Carter:[id number] DOB: 11/30/1945 DOA: 01/03/2023     4 DOS: the patient was seen and examined on 01/08/2023   Brief hospital course: [name] was admitted to the hospital with the working diagnosis of COPD exacerbation.   77 yo male with the past medical history of CKD stage rb, hypertension, dyslipidemia, chronic back pain, T2DM, COPD and heart failure who presented with dyspnea and neck pain. Reported dyspnea for couple of days, with positive dry cough. Patient was evaluated at urgent care and was found hypoxemic, he was placed on supplemental 02 per [place], 2 L/min and referred to the ED. On his initial physical examination his blood pressure was 177/86, RR 17 and HR 82, 02 saturation 92% on supplemental 02 per [place], lungs with wheezing, heart with S1 and S2 present and rhythmic, with no gallops, abdomen with no distention and no lower extremity edema, right neck tender to palpation.   Na 136, K 3,7 Cl 104 bicarbonate 20, glucose 89, bun 22, cr 1,59  Mag 1,5  BNP 381  Wbc 11,6 hgb 11,4 plt 227  Urine analysis SG 1,015, protein 100, trace leukocytes.   Sars covid 19 negative.   Head and neck CT/ angio with no acute changes,  Chest radiograph with hyperinflation.   EKG 85 bpm, normal axis, normal intervals, sinus rhythm with no significant ST segment or T wave changes.   Admitted, started on nebs, steroids for COPD exacerbation -Also given IV Lasix for trace edema, immediately discontinued w/ uptrending creatinine -Complicated by AKI -Called by urgent care 5/14, urine culture drawn at urgent care 5/11 prior to admission growing Enterococcus faecalis  05/16 patient clinically improved, plan to transfer to SNF tomorrow.   Assessment and Plan: * Acute on chronic diastolic CHF (congestive heart failure) (HCC) Echocardiogram with preserved LV systolic function EF 60 to 65%, mid LVH, RV systolic function with mild reduction, RVSP 12.9 mmHg. No  significant valvular disease.   Volume status has improved.  Urine output is 2,050 ml  Plan to continue blood pressure monitoring Diuretic therapy on hold.   COPD (chronic obstructive pulmonary disease) (HCC) Acute exacerbation.   Dyspnea continue to improve.  Continue with dulera bid and decreased prednisone to 20 mg po daily.  Airway clearing techniques with flutter valve and incentive spirometer.  Out of bed to chair tid with meals, PT and OT. Plan to transfer to SNF.   Stage 3b chronic kidney disease (CKD) (HCC) AKI, hypokalemia.   Renal function with improving serum cr at 2,18 with K at 4,6 and serum bicarbonate at 23.   Plan to continue holding diuretic therapy for now.  Follow up renal function as outpatient.   Chronic low back pain with sciatica Continue pain control, Pt and Ot.  Transfer to SNF for rehabilitation.   Type 2 diabetes mellitus with hyperlipidemia (HCC) Uncontrolled T2DM with hyperglycemia.  Fasting glucose today is 323,  Patient used 8 units of sliding scale yesterday, and is getting 3 units with meal of short acting insulin.  Will add basal insulin 5 units and will decreased prednisone to 20 mg.  Resume metformin.   Gout No acute flare.   Hypertension Continue blood pressure monitoring.  Systolic blood pressure 145 mmHg, will resume amlodipine.   Seizures (HCC) No active seizures.   UTI (urinary tract infection) Urine culture from outside hospital positive for Enterococcus fecalis.  Plan to continue antibiotic therapy with ampicillin for 3 days.  Subjective: Patient with no chest pain, dyspnea is improving, continue very weak and deconditioned, not back to his baseline   Physical Exam: Vitals:   01/08/23 0044 01/08/23 0611 01/08/23 0737 01/08/23 0828  BP: (!) 136/30 126/67 (!) 154/89   Pulse: 65 65 (!) 59   Resp: 13 14 14    Temp: (!) 97 F (36.1 C) (!) 97 F (36.1 C) 97.9 F (36.6 C)   TempSrc: Oral Oral Oral   SpO2:   100% 99% 99%  Weight:  80.6 kg    Height:       Neurology awake and alert ENT with no pallor Cardiovascular with S1 and S2 present and rhythmic with no gallops, rubs or murmurs No JVD No lower extremity edema Respiratory with prolonged expiratory phase with no rales or rhonchi, mild end expiratory wheezing.  Abdomen with no distention  Data Reviewed:    Family Communication: no family at the bedside. I called his sister, not able to reach her, I left a message.   Disposition: Status is: Inpatient Remains inpatient appropriate because: pending transfer to SNF  Planned Discharge Destination: Skilled nursing facility     Author: Coralie Keens, MD 01/08/2023 11:40 AM  For on call review [email or web address].

## 2023-01-08 NOTE — TOC Progression Note (Signed)
Transition of Care [facility]) - Progression Note    Patient Details  Name: Luis Carter MRN: [id number] Date of Birth: [birth year]  Transition of [facility]) CM/SW Contact  Leander Rams, LCSW Phone Number: 01/08/2023, 11:11 AM  Clinical Narrative:    Pt has chosen [facility]. CSW has started insurance auth.   TOC will continue to follow.    Expected Discharge Plan: Skilled Nursing Facility Barriers to Discharge: Continued Medical Work up  Expected Discharge Plan and Services                                               Social Determinants of Health (SDOH) Interventions SDOH Screenings   Food Insecurity: No Food Insecurity (01/03/2023)  Housing: Low Risk  (01/03/2023)  Transportation Needs: No Transportation Needs (01/03/2023)  Utilities: Not At Risk (01/03/2023)  Tobacco Use: Medium Risk (01/03/2023)    Readmission Risk Interventions    09/19/2021    2:31 PM  Readmission Risk Prevention Plan  Transportation Screening Complete  PCP or Specialist Appt within 3-5 Days Complete  HRI or Home Care Consult Complete  Social Work Consult for Recovery Care Planning/Counseling Patient refused  Palliative Care Screening Not Applicable  Medication Review [employer]) Complete   Oletta Lamas, MSW, LCSWA, LCASA Transitions of Care  Clinical Social Worker I

## 2023-01-09 DIAGNOSIS — I5033 Acute on chronic diastolic (congestive) heart failure: Secondary | ICD-10-CM | POA: Diagnosis not present

## 2023-01-09 DIAGNOSIS — M5440 Lumbago with sciatica, unspecified side: Secondary | ICD-10-CM | POA: Diagnosis not present

## 2023-01-09 DIAGNOSIS — I10 Essential (primary) hypertension: Secondary | ICD-10-CM | POA: Diagnosis not present

## 2023-01-09 DIAGNOSIS — J449 Chronic obstructive pulmonary disease, unspecified: Secondary | ICD-10-CM | POA: Diagnosis not present

## 2023-01-09 LAB — GLUCOSE, CAPILLARY
Glucose-Capillary: 199 mg/dL — ABNORMAL HIGH (ref 70–99)
Glucose-Capillary: 138 mg/dL — ABNORMAL HIGH (ref 70–99)

## 2023-01-09 MED ORDER — LISINOPRIL 10 MG PO TABS
10.0000 mg | ORAL_TABLET | Freq: Every day | ORAL | Status: DC
  Administered 2023-01-09: 10 mg via ORAL
  Filled 2023-01-09 (×2): qty 1

## 2023-01-09 MED ORDER — GABAPENTIN 300 MG PO CAPS: 300.0000 mg | ORAL_CAPSULE | Freq: Two times a day (BID) | ORAL | 0 refills | Status: AC

## 2023-01-09 MED ORDER — PREDNISONE 20 MG PO TABS: 20.0000 mg | ORAL_TABLET | Freq: Every day | ORAL | 0 refills | Status: AC

## 2023-01-09 MED ORDER — LISINOPRIL 10 MG PO TABS: 10.0000 mg | ORAL_TABLET | Freq: Every day | ORAL | Status: DC
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
## 2023-01-09 NOTE — TOC Transition Note (Addendum)
Transition of Care [facility]) - CM/SW Discharge Note   Patient Details  Name: Luis Carter MRN: [id number] Date of Birth: 1946-01-10  Transition of Care [facility]) CM/SW Contact:  Lockie Pares, RN Phone Number: 01/09/2023, 2:12 PM   Clinical Narrative:    Discussed with team. Patient is walking farther and doing better overall, therefore he does not meet criteria for SNF. Called patient to discuss going home with home health. He would like a agency that works with insurance. Called [facility] from [facility], who accepted the patient for services. We as well as the patient have attempted to get in touch with his sister whom he lives with. He does not have a key to get in to the house. Will keep trying to reach sister for transportation needs for discharge today  1415 Spoke to [employer], paitents sister she will be picking him up around 1500. She is looking for housing for him will place some ASL on the AVS. Referral sent over to Karen Kays for place for Mom referral Final next level of care: Home w Home Health Services Barriers to Discharge:  (cannot get a hold of sister for DC)   Patient Goals and CMS Choice      Discharge Placement               Home with home health          Discharge Plan and Services Additional resources added to the After Visit Summary for                            [facility] Arranged: PT, OT, RN, Social Work [facility] Agency: [facility] Date [facility] Agency Contacted: 01/09/23 Time HH Agency Contacted: 1412 Representative spoke with at [facility] Agency: Becky Sax  Social Determinants of Health (SDOH) Interventions SDOH Screenings   Food Insecurity: No Food Insecurity (01/03/2023)  Housing: Low Risk  (01/03/2023)  Transportation Needs: No Transportation Needs (01/03/2023)  Utilities: Not At Risk (01/03/2023)  Tobacco Use: Medium Risk (01/03/2023)     Readmission Risk Interventions    09/19/2021    2:31 PM  Readmission Risk Prevention  Plan  Transportation Screening Complete  PCP or Specialist Appt within 3-5 Days Complete  HRI or Home Care Consult Complete  Social Work Consult for Recovery Care Planning/Counseling Patient refused  Palliative Care Screening Not Applicable  Medication Review Oceanographer) Complete

## 2023-01-09 NOTE — TOC Progression Note (Addendum)
Transition of Care [facility]) - Progression Note    Patient Details  Name: RAJKUMAR DUCA MRN: [id number] Date of Birth: [birth year]  Transition of Care [facility]) CM/SW Contact  Leander Rams, LCSW Phone Number: 01/09/2023, 2:58 PM  Clinical Narrative:    Insurance auth currently still pending. Recommendation has been changed to [facility] PT, pt is walking 400 ft. CSW met with pt at bedside to discuss him potentially going home with [facility] PT. Pt stated that he would like to go home and stated that it might be better if someone could come to him to work with him instead. CSW called pt sister and she state she can come pick up pt around 3PM. CSW notified CM.   TOC will continue to follow.    Expected Discharge Plan: Skilled Nursing Facility Barriers to Discharge:  (cannot get a hold of sister for DC)  Expected Discharge Plan and Services         Expected Discharge Date: 01/09/23                         HH Arranged: PT, OT, RN, Social Work [employer] Agency: [facility] Date HH Agency Contacted: 01/09/23 Time HH Agency Contacted: 1412 Representative spoke with at [facility] Agency: Becky Sax   Social Determinants of Health (SDOH) Interventions SDOH Screenings   Food Insecurity: No Food Insecurity (01/03/2023)  Housing: Low Risk  (01/03/2023)  Transportation Needs: No Transportation Needs (01/03/2023)  Utilities: Not At Risk (01/03/2023)  Tobacco Use: Medium Risk (01/03/2023)    Readmission Risk Interventions    09/19/2021    2:31 PM  Readmission Risk Prevention Plan  Transportation Screening Complete  PCP or Specialist Appt within 3-5 Days Complete  HRI or Home Care Consult Complete  Social Work Consult for Recovery Care Planning/Counseling Patient refused  Palliative Care Screening Not Applicable  Medication Review Oceanographer) Complete  Oletta Lamas, MSW, LCSWA, LCASA Transitions of Care  Clinical Social Worker I

## 2023-01-09 NOTE — Progress Notes (Signed)
Mobility Specialist Progress Note:   01/09/23 0930  Mobility  Activity Ambulated with assistance in hallway  Level of Assistance Contact guard assist, steadying assist  Assistive Device Front wheel walker  Distance Ambulated (ft) 350 ft  Activity Response Tolerated well  Mobility Referral Yes  $Mobility charge 1 Mobility  Mobility Specialist Start Time (ACUTE ONLY) [id number]  Mobility Specialist Stop Time (ACUTE ONLY) 0940  Mobility Specialist Time Calculation (min) (ACUTE ONLY) 15 min   Pt eager for mobility session. Required steadying assist throughout ambulation with RW. Pt left sitting in chair with all needs met.   Addison Lank Mobility Specialist Please contact via SecureChat or  Rehab office at [phone]

## 2023-01-09 NOTE — Plan of Care (Signed)
  Problem: Education: Goal: Ability to demonstrate management of disease process will improve Outcome: Completed/Met Goal: Ability to verbalize understanding of medication therapies will improve Outcome: Completed/Met Goal: Individualized Educational Video(s) Outcome: Completed/Met   Problem: Activity: Goal: Capacity to carry out activities will improve Outcome: Completed/Met

## 2023-01-13 DIAGNOSIS — E1122 Type 2 diabetes mellitus with diabetic chronic kidney disease: Secondary | ICD-10-CM | POA: Diagnosis not present

## 2023-01-13 DIAGNOSIS — E1169 Type 2 diabetes mellitus with other specified complication: Secondary | ICD-10-CM | POA: Diagnosis not present

## 2023-01-13 DIAGNOSIS — M5440 Lumbago with sciatica, unspecified side: Secondary | ICD-10-CM | POA: Diagnosis not present

## 2023-01-13 DIAGNOSIS — E1165 Type 2 diabetes mellitus with hyperglycemia: Secondary | ICD-10-CM | POA: Diagnosis not present

## 2023-01-13 DIAGNOSIS — Z7951 Long term (current) use of inhaled steroids: Secondary | ICD-10-CM | POA: Diagnosis not present

## 2023-01-13 DIAGNOSIS — N1832 Chronic kidney disease, stage 3b: Secondary | ICD-10-CM | POA: Diagnosis not present

## 2023-01-13 DIAGNOSIS — J441 Chronic obstructive pulmonary disease with (acute) exacerbation: Secondary | ICD-10-CM | POA: Diagnosis not present

## 2023-01-13 DIAGNOSIS — I130 Hypertensive heart and chronic kidney disease with heart failure and stage 1 through stage 4 chronic kidney disease, or unspecified chronic kidney disease: Secondary | ICD-10-CM | POA: Diagnosis not present

## 2023-01-13 DIAGNOSIS — G8929 Other chronic pain: Secondary | ICD-10-CM | POA: Diagnosis not present

## 2023-01-13 DIAGNOSIS — E785 Hyperlipidemia, unspecified: Secondary | ICD-10-CM | POA: Diagnosis not present

## 2023-01-13 DIAGNOSIS — Z87440 Personal history of urinary (tract) infections: Secondary | ICD-10-CM | POA: Diagnosis not present

## 2023-01-13 DIAGNOSIS — I5033 Acute on chronic diastolic (congestive) heart failure: Secondary | ICD-10-CM | POA: Diagnosis not present

## 2023-01-14 DIAGNOSIS — I130 Hypertensive heart and chronic kidney disease with heart failure and stage 1 through stage 4 chronic kidney disease, or unspecified chronic kidney disease: Secondary | ICD-10-CM | POA: Diagnosis not present

## 2023-01-14 DIAGNOSIS — E1122 Type 2 diabetes mellitus with diabetic chronic kidney disease: Secondary | ICD-10-CM | POA: Diagnosis not present

## 2023-01-14 DIAGNOSIS — J441 Chronic obstructive pulmonary disease with (acute) exacerbation: Secondary | ICD-10-CM | POA: Diagnosis not present

## 2023-01-14 DIAGNOSIS — E785 Hyperlipidemia, unspecified: Secondary | ICD-10-CM | POA: Diagnosis not present

## 2023-01-14 DIAGNOSIS — Z7951 Long term (current) use of inhaled steroids: Secondary | ICD-10-CM | POA: Diagnosis not present

## 2023-01-14 DIAGNOSIS — E1165 Type 2 diabetes mellitus with hyperglycemia: Secondary | ICD-10-CM | POA: Diagnosis not present

## 2023-01-14 DIAGNOSIS — N1832 Chronic kidney disease, stage 3b: Secondary | ICD-10-CM | POA: Diagnosis not present

## 2023-01-14 DIAGNOSIS — E1169 Type 2 diabetes mellitus with other specified complication: Secondary | ICD-10-CM | POA: Diagnosis not present

## 2023-01-14 DIAGNOSIS — Z87440 Personal history of urinary (tract) infections: Secondary | ICD-10-CM | POA: Diagnosis not present

## 2023-01-14 DIAGNOSIS — M5440 Lumbago with sciatica, unspecified side: Secondary | ICD-10-CM | POA: Diagnosis not present

## 2023-01-14 DIAGNOSIS — I5033 Acute on chronic diastolic (congestive) heart failure: Secondary | ICD-10-CM | POA: Diagnosis not present

## 2023-01-14 DIAGNOSIS — G8929 Other chronic pain: Secondary | ICD-10-CM | POA: Diagnosis not present

## 2023-01-21 DIAGNOSIS — I130 Hypertensive heart and chronic kidney disease with heart failure and stage 1 through stage 4 chronic kidney disease, or unspecified chronic kidney disease: Secondary | ICD-10-CM | POA: Diagnosis not present

## 2023-01-21 DIAGNOSIS — E785 Hyperlipidemia, unspecified: Secondary | ICD-10-CM | POA: Diagnosis not present

## 2023-01-21 DIAGNOSIS — Z7951 Long term (current) use of inhaled steroids: Secondary | ICD-10-CM | POA: Diagnosis not present

## 2023-01-21 DIAGNOSIS — Z87440 Personal history of urinary (tract) infections: Secondary | ICD-10-CM | POA: Diagnosis not present

## 2023-01-21 DIAGNOSIS — G8929 Other chronic pain: Secondary | ICD-10-CM | POA: Diagnosis not present

## 2023-01-21 DIAGNOSIS — N1832 Chronic kidney disease, stage 3b: Secondary | ICD-10-CM | POA: Diagnosis not present

## 2023-01-21 DIAGNOSIS — J441 Chronic obstructive pulmonary disease with (acute) exacerbation: Secondary | ICD-10-CM | POA: Diagnosis not present

## 2023-01-21 DIAGNOSIS — E1169 Type 2 diabetes mellitus with other specified complication: Secondary | ICD-10-CM | POA: Diagnosis not present

## 2023-01-21 DIAGNOSIS — E1165 Type 2 diabetes mellitus with hyperglycemia: Secondary | ICD-10-CM | POA: Diagnosis not present

## 2023-01-21 DIAGNOSIS — I5033 Acute on chronic diastolic (congestive) heart failure: Secondary | ICD-10-CM | POA: Diagnosis not present

## 2023-01-21 DIAGNOSIS — E1122 Type 2 diabetes mellitus with diabetic chronic kidney disease: Secondary | ICD-10-CM | POA: Diagnosis not present

## 2023-01-21 DIAGNOSIS — M5440 Lumbago with sciatica, unspecified side: Secondary | ICD-10-CM | POA: Diagnosis not present

## 2023-01-22 DIAGNOSIS — E1142 Type 2 diabetes mellitus with diabetic polyneuropathy: Secondary | ICD-10-CM | POA: Diagnosis not present

## 2023-01-22 DIAGNOSIS — J302 Other seasonal allergic rhinitis: Secondary | ICD-10-CM | POA: Diagnosis not present

## 2023-01-22 DIAGNOSIS — J449 Chronic obstructive pulmonary disease, unspecified: Secondary | ICD-10-CM | POA: Diagnosis not present

## 2023-01-22 DIAGNOSIS — I10 Essential (primary) hypertension: Secondary | ICD-10-CM | POA: Diagnosis not present

## 2023-01-23 DIAGNOSIS — G8929 Other chronic pain: Secondary | ICD-10-CM | POA: Diagnosis not present

## 2023-01-23 DIAGNOSIS — E785 Hyperlipidemia, unspecified: Secondary | ICD-10-CM | POA: Diagnosis not present

## 2023-01-23 DIAGNOSIS — Z7951 Long term (current) use of inhaled steroids: Secondary | ICD-10-CM | POA: Diagnosis not present

## 2023-01-23 DIAGNOSIS — I130 Hypertensive heart and chronic kidney disease with heart failure and stage 1 through stage 4 chronic kidney disease, or unspecified chronic kidney disease: Secondary | ICD-10-CM | POA: Diagnosis not present

## 2023-01-23 DIAGNOSIS — E1165 Type 2 diabetes mellitus with hyperglycemia: Secondary | ICD-10-CM | POA: Diagnosis not present

## 2023-01-23 DIAGNOSIS — Z87440 Personal history of urinary (tract) infections: Secondary | ICD-10-CM | POA: Diagnosis not present

## 2023-01-23 DIAGNOSIS — N1832 Chronic kidney disease, stage 3b: Secondary | ICD-10-CM | POA: Diagnosis not present

## 2023-01-23 DIAGNOSIS — E1169 Type 2 diabetes mellitus with other specified complication: Secondary | ICD-10-CM | POA: Diagnosis not present

## 2023-01-23 DIAGNOSIS — J441 Chronic obstructive pulmonary disease with (acute) exacerbation: Secondary | ICD-10-CM | POA: Diagnosis not present

## 2023-01-23 DIAGNOSIS — I5033 Acute on chronic diastolic (congestive) heart failure: Secondary | ICD-10-CM | POA: Diagnosis not present

## 2023-01-23 DIAGNOSIS — E1122 Type 2 diabetes mellitus with diabetic chronic kidney disease: Secondary | ICD-10-CM | POA: Diagnosis not present

## 2023-01-23 DIAGNOSIS — M5440 Lumbago with sciatica, unspecified side: Secondary | ICD-10-CM | POA: Diagnosis not present

## 2023-01-26 DIAGNOSIS — E1142 Type 2 diabetes mellitus with diabetic polyneuropathy: Secondary | ICD-10-CM | POA: Diagnosis not present

## 2023-01-26 DIAGNOSIS — J440 Chronic obstructive pulmonary disease with acute lower respiratory infection: Secondary | ICD-10-CM | POA: Diagnosis not present

## 2023-01-26 DIAGNOSIS — I10 Essential (primary) hypertension: Secondary | ICD-10-CM | POA: Diagnosis not present

## 2023-01-26 DIAGNOSIS — M5440 Lumbago with sciatica, unspecified side: Secondary | ICD-10-CM | POA: Diagnosis not present

## 2023-01-26 DIAGNOSIS — G8929 Other chronic pain: Secondary | ICD-10-CM | POA: Diagnosis not present

## 2023-01-26 DIAGNOSIS — I5033 Acute on chronic diastolic (congestive) heart failure: Secondary | ICD-10-CM | POA: Diagnosis not present

## 2023-01-26 DIAGNOSIS — J449 Chronic obstructive pulmonary disease, unspecified: Secondary | ICD-10-CM | POA: Diagnosis not present

## 2023-01-26 DIAGNOSIS — I130 Hypertensive heart and chronic kidney disease with heart failure and stage 1 through stage 4 chronic kidney disease, or unspecified chronic kidney disease: Secondary | ICD-10-CM | POA: Diagnosis not present

## 2023-01-26 DIAGNOSIS — E1169 Type 2 diabetes mellitus with other specified complication: Secondary | ICD-10-CM | POA: Diagnosis not present

## 2023-01-26 DIAGNOSIS — E1122 Type 2 diabetes mellitus with diabetic chronic kidney disease: Secondary | ICD-10-CM | POA: Diagnosis not present

## 2023-01-26 DIAGNOSIS — E1165 Type 2 diabetes mellitus with hyperglycemia: Secondary | ICD-10-CM | POA: Diagnosis not present

## 2023-01-26 DIAGNOSIS — Z87440 Personal history of urinary (tract) infections: Secondary | ICD-10-CM | POA: Diagnosis not present

## 2023-01-26 DIAGNOSIS — J441 Chronic obstructive pulmonary disease with (acute) exacerbation: Secondary | ICD-10-CM | POA: Diagnosis not present

## 2023-01-26 DIAGNOSIS — N1832 Chronic kidney disease, stage 3b: Secondary | ICD-10-CM | POA: Diagnosis not present

## 2023-01-26 DIAGNOSIS — Z7951 Long term (current) use of inhaled steroids: Secondary | ICD-10-CM | POA: Diagnosis not present

## 2023-01-26 DIAGNOSIS — E785 Hyperlipidemia, unspecified: Secondary | ICD-10-CM | POA: Diagnosis not present

## 2023-01-27 DIAGNOSIS — E1122 Type 2 diabetes mellitus with diabetic chronic kidney disease: Secondary | ICD-10-CM | POA: Diagnosis not present

## 2023-01-27 DIAGNOSIS — J441 Chronic obstructive pulmonary disease with (acute) exacerbation: Secondary | ICD-10-CM | POA: Diagnosis not present

## 2023-01-27 DIAGNOSIS — Z87440 Personal history of urinary (tract) infections: Secondary | ICD-10-CM | POA: Diagnosis not present

## 2023-01-27 DIAGNOSIS — G8929 Other chronic pain: Secondary | ICD-10-CM | POA: Diagnosis not present

## 2023-01-27 DIAGNOSIS — E1169 Type 2 diabetes mellitus with other specified complication: Secondary | ICD-10-CM | POA: Diagnosis not present

## 2023-01-27 DIAGNOSIS — I130 Hypertensive heart and chronic kidney disease with heart failure and stage 1 through stage 4 chronic kidney disease, or unspecified chronic kidney disease: Secondary | ICD-10-CM | POA: Diagnosis not present

## 2023-01-27 DIAGNOSIS — Z7951 Long term (current) use of inhaled steroids: Secondary | ICD-10-CM | POA: Diagnosis not present

## 2023-01-27 DIAGNOSIS — M5440 Lumbago with sciatica, unspecified side: Secondary | ICD-10-CM | POA: Diagnosis not present

## 2023-01-27 DIAGNOSIS — I5033 Acute on chronic diastolic (congestive) heart failure: Secondary | ICD-10-CM | POA: Diagnosis not present

## 2023-01-27 DIAGNOSIS — N1832 Chronic kidney disease, stage 3b: Secondary | ICD-10-CM | POA: Diagnosis not present

## 2023-01-27 DIAGNOSIS — E1165 Type 2 diabetes mellitus with hyperglycemia: Secondary | ICD-10-CM | POA: Diagnosis not present

## 2023-01-27 DIAGNOSIS — E785 Hyperlipidemia, unspecified: Secondary | ICD-10-CM | POA: Diagnosis not present

## 2023-01-28 DIAGNOSIS — I130 Hypertensive heart and chronic kidney disease with heart failure and stage 1 through stage 4 chronic kidney disease, or unspecified chronic kidney disease: Secondary | ICD-10-CM | POA: Diagnosis not present

## 2023-01-28 DIAGNOSIS — N1832 Chronic kidney disease, stage 3b: Secondary | ICD-10-CM | POA: Diagnosis not present

## 2023-01-28 DIAGNOSIS — M5440 Lumbago with sciatica, unspecified side: Secondary | ICD-10-CM | POA: Diagnosis not present

## 2023-01-28 DIAGNOSIS — G8929 Other chronic pain: Secondary | ICD-10-CM | POA: Diagnosis not present

## 2023-01-28 DIAGNOSIS — J449 Chronic obstructive pulmonary disease, unspecified: Secondary | ICD-10-CM | POA: Diagnosis not present

## 2023-01-28 DIAGNOSIS — Z87440 Personal history of urinary (tract) infections: Secondary | ICD-10-CM | POA: Diagnosis not present

## 2023-01-28 DIAGNOSIS — E785 Hyperlipidemia, unspecified: Secondary | ICD-10-CM | POA: Diagnosis not present

## 2023-01-28 DIAGNOSIS — I5033 Acute on chronic diastolic (congestive) heart failure: Secondary | ICD-10-CM | POA: Diagnosis not present

## 2023-01-28 DIAGNOSIS — Z7951 Long term (current) use of inhaled steroids: Secondary | ICD-10-CM | POA: Diagnosis not present

## 2023-01-28 DIAGNOSIS — J441 Chronic obstructive pulmonary disease with (acute) exacerbation: Secondary | ICD-10-CM | POA: Diagnosis not present

## 2023-01-28 DIAGNOSIS — E1165 Type 2 diabetes mellitus with hyperglycemia: Secondary | ICD-10-CM | POA: Diagnosis not present

## 2023-01-28 DIAGNOSIS — E1169 Type 2 diabetes mellitus with other specified complication: Secondary | ICD-10-CM | POA: Diagnosis not present

## 2023-01-28 DIAGNOSIS — E1122 Type 2 diabetes mellitus with diabetic chronic kidney disease: Secondary | ICD-10-CM | POA: Diagnosis not present

## 2023-01-29 DIAGNOSIS — E1169 Type 2 diabetes mellitus with other specified complication: Secondary | ICD-10-CM | POA: Diagnosis not present

## 2023-01-29 DIAGNOSIS — M5440 Lumbago with sciatica, unspecified side: Secondary | ICD-10-CM | POA: Diagnosis not present

## 2023-01-29 DIAGNOSIS — N1832 Chronic kidney disease, stage 3b: Secondary | ICD-10-CM | POA: Diagnosis not present

## 2023-01-29 DIAGNOSIS — E785 Hyperlipidemia, unspecified: Secondary | ICD-10-CM | POA: Diagnosis not present

## 2023-01-29 DIAGNOSIS — Z7951 Long term (current) use of inhaled steroids: Secondary | ICD-10-CM | POA: Diagnosis not present

## 2023-01-29 DIAGNOSIS — Z87440 Personal history of urinary (tract) infections: Secondary | ICD-10-CM | POA: Diagnosis not present

## 2023-01-29 DIAGNOSIS — I5033 Acute on chronic diastolic (congestive) heart failure: Secondary | ICD-10-CM | POA: Diagnosis not present

## 2023-01-29 DIAGNOSIS — I130 Hypertensive heart and chronic kidney disease with heart failure and stage 1 through stage 4 chronic kidney disease, or unspecified chronic kidney disease: Secondary | ICD-10-CM | POA: Diagnosis not present

## 2023-01-29 DIAGNOSIS — G8929 Other chronic pain: Secondary | ICD-10-CM | POA: Diagnosis not present

## 2023-01-29 DIAGNOSIS — E1122 Type 2 diabetes mellitus with diabetic chronic kidney disease: Secondary | ICD-10-CM | POA: Diagnosis not present

## 2023-01-29 DIAGNOSIS — J441 Chronic obstructive pulmonary disease with (acute) exacerbation: Secondary | ICD-10-CM | POA: Diagnosis not present

## 2023-01-29 DIAGNOSIS — E1165 Type 2 diabetes mellitus with hyperglycemia: Secondary | ICD-10-CM | POA: Diagnosis not present

## 2023-02-03 DIAGNOSIS — J449 Chronic obstructive pulmonary disease, unspecified: Secondary | ICD-10-CM | POA: Diagnosis not present

## 2023-02-03 DIAGNOSIS — I10 Essential (primary) hypertension: Secondary | ICD-10-CM | POA: Diagnosis not present

## 2023-02-03 DIAGNOSIS — M179 Osteoarthritis of knee, unspecified: Secondary | ICD-10-CM | POA: Diagnosis not present

## 2023-02-03 DIAGNOSIS — E1142 Type 2 diabetes mellitus with diabetic polyneuropathy: Secondary | ICD-10-CM | POA: Diagnosis not present

## 2023-02-04 DIAGNOSIS — Z7951 Long term (current) use of inhaled steroids: Secondary | ICD-10-CM | POA: Diagnosis not present

## 2023-02-04 DIAGNOSIS — I130 Hypertensive heart and chronic kidney disease with heart failure and stage 1 through stage 4 chronic kidney disease, or unspecified chronic kidney disease: Secondary | ICD-10-CM | POA: Diagnosis not present

## 2023-02-04 DIAGNOSIS — G8929 Other chronic pain: Secondary | ICD-10-CM | POA: Diagnosis not present

## 2023-02-04 DIAGNOSIS — E1165 Type 2 diabetes mellitus with hyperglycemia: Secondary | ICD-10-CM | POA: Diagnosis not present

## 2023-02-04 DIAGNOSIS — M5440 Lumbago with sciatica, unspecified side: Secondary | ICD-10-CM | POA: Diagnosis not present

## 2023-02-04 DIAGNOSIS — J441 Chronic obstructive pulmonary disease with (acute) exacerbation: Secondary | ICD-10-CM | POA: Diagnosis not present

## 2023-02-04 DIAGNOSIS — N1832 Chronic kidney disease, stage 3b: Secondary | ICD-10-CM | POA: Diagnosis not present

## 2023-02-04 DIAGNOSIS — E1169 Type 2 diabetes mellitus with other specified complication: Secondary | ICD-10-CM | POA: Diagnosis not present

## 2023-02-04 DIAGNOSIS — E785 Hyperlipidemia, unspecified: Secondary | ICD-10-CM | POA: Diagnosis not present

## 2023-02-04 DIAGNOSIS — Z87440 Personal history of urinary (tract) infections: Secondary | ICD-10-CM | POA: Diagnosis not present

## 2023-02-04 DIAGNOSIS — E1122 Type 2 diabetes mellitus with diabetic chronic kidney disease: Secondary | ICD-10-CM | POA: Diagnosis not present

## 2023-02-04 DIAGNOSIS — I5033 Acute on chronic diastolic (congestive) heart failure: Secondary | ICD-10-CM | POA: Diagnosis not present

## 2023-02-05 DIAGNOSIS — I5033 Acute on chronic diastolic (congestive) heart failure: Secondary | ICD-10-CM | POA: Diagnosis not present

## 2023-02-05 DIAGNOSIS — M5440 Lumbago with sciatica, unspecified side: Secondary | ICD-10-CM | POA: Diagnosis not present

## 2023-02-05 DIAGNOSIS — E1169 Type 2 diabetes mellitus with other specified complication: Secondary | ICD-10-CM | POA: Diagnosis not present

## 2023-02-05 DIAGNOSIS — J441 Chronic obstructive pulmonary disease with (acute) exacerbation: Secondary | ICD-10-CM | POA: Diagnosis not present

## 2023-02-05 DIAGNOSIS — E1122 Type 2 diabetes mellitus with diabetic chronic kidney disease: Secondary | ICD-10-CM | POA: Diagnosis not present

## 2023-02-05 DIAGNOSIS — N1832 Chronic kidney disease, stage 3b: Secondary | ICD-10-CM | POA: Diagnosis not present

## 2023-02-05 DIAGNOSIS — Z87440 Personal history of urinary (tract) infections: Secondary | ICD-10-CM | POA: Diagnosis not present

## 2023-02-05 DIAGNOSIS — I130 Hypertensive heart and chronic kidney disease with heart failure and stage 1 through stage 4 chronic kidney disease, or unspecified chronic kidney disease: Secondary | ICD-10-CM | POA: Diagnosis not present

## 2023-02-05 DIAGNOSIS — G8929 Other chronic pain: Secondary | ICD-10-CM | POA: Diagnosis not present

## 2023-02-05 DIAGNOSIS — E785 Hyperlipidemia, unspecified: Secondary | ICD-10-CM | POA: Diagnosis not present

## 2023-02-05 DIAGNOSIS — Z7951 Long term (current) use of inhaled steroids: Secondary | ICD-10-CM | POA: Diagnosis not present

## 2023-02-05 DIAGNOSIS — E1165 Type 2 diabetes mellitus with hyperglycemia: Secondary | ICD-10-CM | POA: Diagnosis not present

## 2023-02-06 DIAGNOSIS — Z7951 Long term (current) use of inhaled steroids: Secondary | ICD-10-CM | POA: Diagnosis not present

## 2023-02-06 DIAGNOSIS — N1832 Chronic kidney disease, stage 3b: Secondary | ICD-10-CM | POA: Diagnosis not present

## 2023-02-06 DIAGNOSIS — Z87440 Personal history of urinary (tract) infections: Secondary | ICD-10-CM | POA: Diagnosis not present

## 2023-02-06 DIAGNOSIS — J441 Chronic obstructive pulmonary disease with (acute) exacerbation: Secondary | ICD-10-CM | POA: Diagnosis not present

## 2023-02-06 DIAGNOSIS — E785 Hyperlipidemia, unspecified: Secondary | ICD-10-CM | POA: Diagnosis not present

## 2023-02-06 DIAGNOSIS — M5440 Lumbago with sciatica, unspecified side: Secondary | ICD-10-CM | POA: Diagnosis not present

## 2023-02-06 DIAGNOSIS — E1169 Type 2 diabetes mellitus with other specified complication: Secondary | ICD-10-CM | POA: Diagnosis not present

## 2023-02-06 DIAGNOSIS — I130 Hypertensive heart and chronic kidney disease with heart failure and stage 1 through stage 4 chronic kidney disease, or unspecified chronic kidney disease: Secondary | ICD-10-CM | POA: Diagnosis not present

## 2023-02-06 DIAGNOSIS — G8929 Other chronic pain: Secondary | ICD-10-CM | POA: Diagnosis not present

## 2023-02-06 DIAGNOSIS — E1165 Type 2 diabetes mellitus with hyperglycemia: Secondary | ICD-10-CM | POA: Diagnosis not present

## 2023-02-06 DIAGNOSIS — E1122 Type 2 diabetes mellitus with diabetic chronic kidney disease: Secondary | ICD-10-CM | POA: Diagnosis not present

## 2023-02-06 DIAGNOSIS — I5033 Acute on chronic diastolic (congestive) heart failure: Secondary | ICD-10-CM | POA: Diagnosis not present

## 2023-02-09 DIAGNOSIS — E1169 Type 2 diabetes mellitus with other specified complication: Secondary | ICD-10-CM | POA: Diagnosis not present

## 2023-02-09 DIAGNOSIS — N1832 Chronic kidney disease, stage 3b: Secondary | ICD-10-CM | POA: Diagnosis not present

## 2023-02-09 DIAGNOSIS — E1122 Type 2 diabetes mellitus with diabetic chronic kidney disease: Secondary | ICD-10-CM | POA: Diagnosis not present

## 2023-02-09 DIAGNOSIS — I130 Hypertensive heart and chronic kidney disease with heart failure and stage 1 through stage 4 chronic kidney disease, or unspecified chronic kidney disease: Secondary | ICD-10-CM | POA: Diagnosis not present

## 2023-02-09 DIAGNOSIS — M5440 Lumbago with sciatica, unspecified side: Secondary | ICD-10-CM | POA: Diagnosis not present

## 2023-02-09 DIAGNOSIS — E785 Hyperlipidemia, unspecified: Secondary | ICD-10-CM | POA: Diagnosis not present

## 2023-02-09 DIAGNOSIS — E1165 Type 2 diabetes mellitus with hyperglycemia: Secondary | ICD-10-CM | POA: Diagnosis not present

## 2023-02-09 DIAGNOSIS — I5033 Acute on chronic diastolic (congestive) heart failure: Secondary | ICD-10-CM | POA: Diagnosis not present

## 2023-02-09 DIAGNOSIS — Z87440 Personal history of urinary (tract) infections: Secondary | ICD-10-CM | POA: Diagnosis not present

## 2023-02-09 DIAGNOSIS — J441 Chronic obstructive pulmonary disease with (acute) exacerbation: Secondary | ICD-10-CM | POA: Diagnosis not present

## 2023-02-09 DIAGNOSIS — Z7951 Long term (current) use of inhaled steroids: Secondary | ICD-10-CM | POA: Diagnosis not present

## 2023-02-09 DIAGNOSIS — G8929 Other chronic pain: Secondary | ICD-10-CM | POA: Diagnosis not present

## 2023-02-11 DIAGNOSIS — E1165 Type 2 diabetes mellitus with hyperglycemia: Secondary | ICD-10-CM | POA: Diagnosis not present

## 2023-02-11 DIAGNOSIS — E1169 Type 2 diabetes mellitus with other specified complication: Secondary | ICD-10-CM | POA: Diagnosis not present

## 2023-02-11 DIAGNOSIS — E1122 Type 2 diabetes mellitus with diabetic chronic kidney disease: Secondary | ICD-10-CM | POA: Diagnosis not present

## 2023-02-11 DIAGNOSIS — I5033 Acute on chronic diastolic (congestive) heart failure: Secondary | ICD-10-CM | POA: Diagnosis not present

## 2023-02-11 DIAGNOSIS — J441 Chronic obstructive pulmonary disease with (acute) exacerbation: Secondary | ICD-10-CM | POA: Diagnosis not present

## 2023-02-11 DIAGNOSIS — Z7951 Long term (current) use of inhaled steroids: Secondary | ICD-10-CM | POA: Diagnosis not present

## 2023-02-11 DIAGNOSIS — G8929 Other chronic pain: Secondary | ICD-10-CM | POA: Diagnosis not present

## 2023-02-11 DIAGNOSIS — Z87440 Personal history of urinary (tract) infections: Secondary | ICD-10-CM | POA: Diagnosis not present

## 2023-02-11 DIAGNOSIS — I130 Hypertensive heart and chronic kidney disease with heart failure and stage 1 through stage 4 chronic kidney disease, or unspecified chronic kidney disease: Secondary | ICD-10-CM | POA: Diagnosis not present

## 2023-02-11 DIAGNOSIS — M5440 Lumbago with sciatica, unspecified side: Secondary | ICD-10-CM | POA: Diagnosis not present

## 2023-02-11 DIAGNOSIS — E785 Hyperlipidemia, unspecified: Secondary | ICD-10-CM | POA: Diagnosis not present

## 2023-02-11 DIAGNOSIS — N1832 Chronic kidney disease, stage 3b: Secondary | ICD-10-CM | POA: Diagnosis not present

## 2023-02-20 ENCOUNTER — Observation Stay (HOSPITAL_COMMUNITY)
Admission: EM | Admit: 2023-02-20 | Discharge: 2023-02-21 | Disposition: A | Discharge status: Home Health | Payer: 59 | Attending: Nurse Practitioner | Admitting: Nurse Practitioner

## 2023-02-20 ENCOUNTER — Emergency Department (HOSPITAL_COMMUNITY): Payer: 59

## 2023-02-20 ENCOUNTER — Other Ambulatory Visit: Payer: Self-pay

## 2023-02-20 ENCOUNTER — Encounter (HOSPITAL_COMMUNITY): Payer: Self-pay

## 2023-02-20 DIAGNOSIS — I130 Hypertensive heart and chronic kidney disease with heart failure and stage 1 through stage 4 chronic kidney disease, or unspecified chronic kidney disease: Secondary | ICD-10-CM | POA: Insufficient documentation

## 2023-02-20 DIAGNOSIS — R0602 Shortness of breath: Secondary | ICD-10-CM | POA: Diagnosis not present

## 2023-02-20 DIAGNOSIS — R001 Bradycardia, unspecified: Principal | ICD-10-CM | POA: Diagnosis present

## 2023-02-20 DIAGNOSIS — Z79899 Other long term (current) drug therapy: Secondary | ICD-10-CM | POA: Insufficient documentation

## 2023-02-20 DIAGNOSIS — E1169 Type 2 diabetes mellitus with other specified complication: Secondary | ICD-10-CM | POA: Diagnosis not present

## 2023-02-20 DIAGNOSIS — Z87440 Personal history of urinary (tract) infections: Secondary | ICD-10-CM | POA: Diagnosis not present

## 2023-02-20 DIAGNOSIS — R531 Weakness: Secondary | ICD-10-CM | POA: Diagnosis not present

## 2023-02-20 DIAGNOSIS — R42 Dizziness and giddiness: Secondary | ICD-10-CM | POA: Diagnosis not present

## 2023-02-20 DIAGNOSIS — R55 Syncope and collapse: Secondary | ICD-10-CM | POA: Insufficient documentation

## 2023-02-20 DIAGNOSIS — M5440 Lumbago with sciatica, unspecified side: Secondary | ICD-10-CM | POA: Diagnosis not present

## 2023-02-20 DIAGNOSIS — I2510 Atherosclerotic heart disease of native coronary artery without angina pectoris: Secondary | ICD-10-CM | POA: Diagnosis not present

## 2023-02-20 DIAGNOSIS — J45909 Unspecified asthma, uncomplicated: Secondary | ICD-10-CM | POA: Diagnosis not present

## 2023-02-20 DIAGNOSIS — J441 Chronic obstructive pulmonary disease with (acute) exacerbation: Secondary | ICD-10-CM | POA: Diagnosis not present

## 2023-02-20 DIAGNOSIS — I5032 Chronic diastolic (congestive) heart failure: Secondary | ICD-10-CM | POA: Diagnosis not present

## 2023-02-20 DIAGNOSIS — Z7951 Long term (current) use of inhaled steroids: Secondary | ICD-10-CM | POA: Diagnosis not present

## 2023-02-20 DIAGNOSIS — E785 Hyperlipidemia, unspecified: Secondary | ICD-10-CM | POA: Diagnosis not present

## 2023-02-20 DIAGNOSIS — I5033 Acute on chronic diastolic (congestive) heart failure: Secondary | ICD-10-CM | POA: Diagnosis not present

## 2023-02-20 DIAGNOSIS — Z87891 Personal history of nicotine dependence: Secondary | ICD-10-CM | POA: Diagnosis not present

## 2023-02-20 DIAGNOSIS — J449 Chronic obstructive pulmonary disease, unspecified: Secondary | ICD-10-CM | POA: Diagnosis not present

## 2023-02-20 DIAGNOSIS — Z955 Presence of coronary angioplasty implant and graft: Secondary | ICD-10-CM | POA: Diagnosis not present

## 2023-02-20 DIAGNOSIS — Z7982 Long term (current) use of aspirin: Secondary | ICD-10-CM | POA: Insufficient documentation

## 2023-02-20 DIAGNOSIS — R918 Other nonspecific abnormal finding of lung field: Secondary | ICD-10-CM | POA: Diagnosis not present

## 2023-02-20 DIAGNOSIS — E1165 Type 2 diabetes mellitus with hyperglycemia: Secondary | ICD-10-CM | POA: Diagnosis not present

## 2023-02-20 DIAGNOSIS — E1122 Type 2 diabetes mellitus with diabetic chronic kidney disease: Secondary | ICD-10-CM | POA: Diagnosis not present

## 2023-02-20 DIAGNOSIS — Z7984 Long term (current) use of oral hypoglycemic drugs: Secondary | ICD-10-CM | POA: Insufficient documentation

## 2023-02-20 DIAGNOSIS — N1832 Chronic kidney disease, stage 3b: Secondary | ICD-10-CM | POA: Diagnosis not present

## 2023-02-20 DIAGNOSIS — G8929 Other chronic pain: Secondary | ICD-10-CM | POA: Diagnosis not present

## 2023-02-20 LAB — COMPREHENSIVE METABOLIC PANEL
ALT: 12 U/L (ref 0–44)
AST: 23 U/L (ref 15–41)
Albumin: 3 g/dL — ABNORMAL LOW (ref 3.5–5.0)
Alkaline Phosphatase: 103 U/L (ref 38–126)
Anion gap: 7 (ref 5–15)
BUN: 20 mg/dL (ref 8–23)
CO2: 25 mmol/L (ref 22–32)
Calcium: 8.2 mg/dL — ABNORMAL LOW (ref 8.9–10.3)
Chloride: 104 mmol/L (ref 98–111)
Creatinine, Ser: 1.34 mg/dL — ABNORMAL HIGH (ref 0.61–1.24)
GFR, Estimated: 54 mL/min — ABNORMAL LOW (ref >60)
Glucose, Bld: 96 mg/dL (ref 70–99)
Potassium: 3.7 mmol/L (ref 3.5–5.1)
Sodium: 136 mmol/L (ref 135–145)
Total Bilirubin: 0.4 mg/dL (ref 0.3–1.2)
Total Protein: 6.2 g/dL — ABNORMAL LOW (ref 6.5–8.1)

## 2023-02-20 LAB — CBC WITH DIFFERENTIAL/PLATELET
Abs Immature Granulocytes: 0.01 10*3/uL (ref 0.00–0.07)
Basophils Absolute: 0.1 10*3/uL (ref 0.0–0.1)
Basophils Relative: 1 %
Eosinophils Absolute: 0.6 10*3/uL — ABNORMAL HIGH (ref 0.0–0.5)
Eosinophils Relative: 9 %
HCT: 32.6 % — ABNORMAL LOW (ref 39.0–52.0)
Hemoglobin: 10.2 g/dL — ABNORMAL LOW (ref 13.0–17.0)
Immature Granulocytes: 0 %
Lymphocytes Relative: 35 %
Lymphs Abs: 2.3 10*3/uL (ref 0.7–4.0)
MCH: 26.9 pg (ref 26.0–34.0)
MCHC: 31.3 g/dL (ref 30.0–36.0)
MCV: 86 fL (ref 80.0–100.0)
Monocytes Absolute: 0.7 10*3/uL (ref 0.1–1.0)
Monocytes Relative: 12 %
Neutro Abs: 2.8 10*3/uL (ref 1.7–7.7)
Neutrophils Relative %: 43 %
Platelets: 216 10*3/uL (ref 150–400)
RBC: 3.79 MIL/uL — ABNORMAL LOW (ref 4.22–5.81)
RDW: 16.1 % — ABNORMAL HIGH (ref 11.5–15.5)
WBC: 6.5 10*3/uL (ref 4.0–10.5)
nRBC: 0 % (ref 0.0–0.2)

## 2023-02-20 LAB — URINALYSIS, ROUTINE W REFLEX MICROSCOPIC
Bilirubin Urine: NEGATIVE
Glucose, UA: NEGATIVE mg/dL
Ketones, ur: NEGATIVE mg/dL
Nitrite: NEGATIVE
Protein, ur: 30 mg/dL — AB
Specific Gravity, Urine: 1.015 (ref 1.005–1.030)
WBC, UA: >50 WBC/hpf (ref 0–5)
pH: 5 (ref 5.0–8.0)

## 2023-02-20 LAB — TROPONIN I (HIGH SENSITIVITY)
Troponin I (High Sensitivity): 16 ng/L (ref <18)
Troponin I (High Sensitivity): 13 ng/L (ref <18)

## 2023-02-20 LAB — GLUCOSE, CAPILLARY
Glucose-Capillary: 65 mg/dL — ABNORMAL LOW (ref 70–99)
Glucose-Capillary: 113 mg/dL — ABNORMAL HIGH (ref 70–99)
Glucose-Capillary: 96 mg/dL (ref 70–99)

## 2023-02-20 LAB — TSH: TSH: 3.311 u[IU]/mL (ref 0.350–4.500)

## 2023-02-20 LAB — MAGNESIUM: Magnesium: 1.4 mg/dL — ABNORMAL LOW (ref 1.7–2.4)

## 2023-02-20 LAB — PHOSPHORUS: Phosphorus: 3.3 mg/dL (ref 2.5–4.6)

## 2023-02-20 LAB — LIPASE, BLOOD: Lipase: 27 U/L (ref 11–51)

## 2023-02-20 MED ORDER — ENOXAPARIN SODIUM 40 MG/0.4ML IJ SOSY
40.0000 mg | PREFILLED_SYRINGE | INTRAMUSCULAR | Status: DC
  Administered 2023-02-20: 40 mg via SUBCUTANEOUS
  Filled 2023-02-20: qty 0.4

## 2023-02-20 MED ORDER — LISINOPRIL 10 MG PO TABS: 10.0000 mg | ORAL_TABLET | Freq: Every day | ORAL | Status: DC

## 2023-02-20 MED ORDER — FLUTICASONE PROPIONATE 50 MCG/ACT NA SUSP
2.0000 | Freq: Two times a day (BID) | NASAL | Status: DC
  Administered 2023-02-20 – 2023-02-21 (×2): 2 via NASAL
  Filled 2023-02-20: qty 16

## 2023-02-20 MED ORDER — PANTOPRAZOLE SODIUM 40 MG PO TBEC
40.0000 mg | DELAYED_RELEASE_TABLET | Freq: Every day | ORAL | Status: DC
  Administered 2023-02-20 – 2023-02-21 (×2): 40 mg via ORAL
  Filled 2023-02-20 (×2): qty 1

## 2023-02-20 MED ORDER — MONTELUKAST SODIUM 10 MG PO TABS
10.0000 mg | ORAL_TABLET | Freq: Every day | ORAL | Status: DC
  Administered 2023-02-20 – 2023-02-21 (×2): 10 mg via ORAL
  Filled 2023-02-20: qty 1

## 2023-02-20 MED ORDER — MAGNESIUM SULFATE 2 GM/50ML IV SOLN
2.0000 g | Freq: Once | INTRAVENOUS | Status: AC
  Administered 2023-02-20: 2 g via INTRAVENOUS
  Filled 2023-02-20: qty 50

## 2023-02-20 MED ORDER — POTASSIUM CHLORIDE CRYS ER 20 MEQ PO TBCR
40.0000 meq | EXTENDED_RELEASE_TABLET | Freq: Once | ORAL | Status: AC
  Administered 2023-02-20: 40 meq via ORAL
  Filled 2023-02-20: qty 2

## 2023-02-20 MED ORDER — GABAPENTIN 300 MG PO CAPS
300.0000 mg | ORAL_CAPSULE | Freq: Two times a day (BID) | ORAL | Status: DC
  Administered 2023-02-20 – 2023-02-21 (×2): 300 mg via ORAL
  Filled 2023-02-20 (×2): qty 1

## 2023-02-20 MED ORDER — SODIUM CHLORIDE 0.9% FLUSH
3.0000 mL | Freq: Two times a day (BID) | INTRAVENOUS | Status: DC
  Administered 2023-02-20 – 2023-02-21 (×3): 3 mL via INTRAVENOUS

## 2023-02-20 MED ORDER — MIRABEGRON ER 25 MG PO TB24
25.0000 mg | ORAL_TABLET | Freq: Every day | ORAL | Status: DC
  Administered 2023-02-21: 25 mg via ORAL
  Filled 2023-02-20: qty 1

## 2023-02-20 MED ORDER — TIZANIDINE HCL 4 MG PO TABS: 4.0000 mg | ORAL_TABLET | Freq: Two times a day (BID) | ORAL | Status: DC | PRN

## 2023-02-20 MED ORDER — ALLOPURINOL 100 MG PO TABS
200.0000 mg | ORAL_TABLET | Freq: Every day | ORAL | Status: DC
  Administered 2023-02-21: 200 mg via ORAL
  Filled 2023-02-20: qty 2

## 2023-02-20 MED ORDER — ASPIRIN 81 MG PO TBEC
81.0000 mg | DELAYED_RELEASE_TABLET | Freq: Every day | ORAL | Status: DC
  Administered 2023-02-20 – 2023-02-21 (×2): 81 mg via ORAL
  Filled 2023-02-20 (×2): qty 1

## 2023-02-20 MED ORDER — MOMETASONE FURO-FORMOTEROL FUM 200-5 MCG/ACT IN AERO
2.0000 | INHALATION_SPRAY | Freq: Two times a day (BID) | RESPIRATORY_TRACT | Status: DC
  Administered 2023-02-20: 2 via RESPIRATORY_TRACT
  Filled 2023-02-20: qty 8.8

## 2023-02-20 MED ORDER — AMLODIPINE BESYLATE 5 MG PO TABS
5.0000 mg | ORAL_TABLET | Freq: Every day | ORAL | Status: DC
  Administered 2023-02-20: 5 mg via ORAL
  Filled 2023-02-20: qty 1

## 2023-02-20 MED ORDER — ALBUTEROL SULFATE HFA 108 (90 BASE) MCG/ACT IN AERS: 2.0000 | INHALATION_SPRAY | Freq: Four times a day (QID) | RESPIRATORY_TRACT | Status: DC | PRN

## 2023-02-20 MED ORDER — INSULIN ASPART 100 UNIT/ML IJ SOLN: 0.0000 [IU] | Freq: Three times a day (TID) | INTRAMUSCULAR | Status: DC

## 2023-02-20 MED ORDER — IPRATROPIUM-ALBUTEROL 0.5-2.5 (3) MG/3ML IN SOLN: 3.0000 mL | Freq: Four times a day (QID) | RESPIRATORY_TRACT | Status: DC | PRN

## 2023-02-20 MED ORDER — METFORMIN HCL 500 MG PO TABS
500.0000 mg | ORAL_TABLET | Freq: Two times a day (BID) | ORAL | Status: DC
  Administered 2023-02-20 – 2023-02-21 (×2): 500 mg via ORAL
  Filled 2023-02-20 (×2): qty 1

## 2023-02-20 MED ORDER — ATORVASTATIN CALCIUM 10 MG PO TABS
20.0000 mg | ORAL_TABLET | Freq: Every day | ORAL | Status: DC
  Administered 2023-02-20: 20 mg via ORAL
  Filled 2023-02-20: qty 2
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
## 2023-02-20 NOTE — ED Triage Notes (Signed)
Pt BIB [facility] from home d/t PT being there & walking him then pt became dizzy & weak & bradycardic in the 40's. Denies CO, does endorse RLQ abd pain, 138/90, 98% RA, CBG 150, 18g Rt AC.
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
## 2023-02-21 DIAGNOSIS — R001 Bradycardia, unspecified: Secondary | ICD-10-CM | POA: Diagnosis not present

## 2023-02-21 LAB — GLUCOSE, CAPILLARY
Glucose-Capillary: 91 mg/dL (ref 70–99)
Glucose-Capillary: 80 mg/dL (ref 70–99)

## 2023-02-21 MED ORDER — FENTANYL CITRATE PF 50 MCG/ML IJ SOSY
25.0000 ug | PREFILLED_SYRINGE | INTRAMUSCULAR | Status: DC | PRN
  Administered 2023-02-21: 25 ug via INTRAVENOUS
  Filled 2023-02-21: qty 1

## 2023-02-21 MED ORDER — DICYCLOMINE HCL 20 MG PO TABS: 20.0000 mg | ORAL_TABLET | Freq: Three times a day (TID) | ORAL | Status: DC | PRN

## 2023-02-21 MED ORDER — LISINOPRIL 20 MG PO TABS
20.0000 mg | ORAL_TABLET | Freq: Every day | ORAL | Status: DC
  Administered 2023-02-21: 20 mg via ORAL
  Filled 2023-02-21: qty 1

## 2023-02-21 MED ORDER — HYDRALAZINE HCL 25 MG PO TABS
25.0000 mg | ORAL_TABLET | Freq: Three times a day (TID) | ORAL | Status: DC
  Administered 2023-02-21: 25 mg via ORAL
  Filled 2023-02-21: qty 1

## 2023-02-21 MED ORDER — LISINOPRIL 20 MG PO TABS: 20.0000 mg | ORAL_TABLET | Freq: Every day | ORAL | 1 refills | Status: AC

## 2023-02-21 MED ORDER — HYDRALAZINE HCL 25 MG PO TABS: 25.0000 mg | ORAL_TABLET | Freq: Three times a day (TID) | ORAL | 1 refills | Status: DC

## 2023-02-21 MED ORDER — HYDRALAZINE HCL 25 MG PO TABS
25.0000 mg | ORAL_TABLET | Freq: Four times a day (QID) | ORAL | Status: DC | PRN
  Administered 2023-02-21: 25 mg via ORAL
  Filled 2023-02-21: qty 1

## 2023-02-21 MED ORDER — HYDRALAZINE HCL 50 MG PO TABS: 50.0000 mg | ORAL_TABLET | Freq: Three times a day (TID) | ORAL | Status: DC

## 2023-02-21 NOTE — Evaluation (Signed)
Physical Therapy Evaluation Patient Details Name: Luis Carter MRN: [id number] DOB: 1945-12-21 Today's Date: 02/21/2023  History of Present Illness  77 yo male admitted 6/28 with lightheadedness, bradycardia. PMHx: HTN, HLD, DM, COPD, diastolic HF, CKD, gout, seizures, tobacco use, cocaine abuse  Clinical Impression  PT pleasant, lives at home with sister with bed/bath upstairs and uses cane at baseline. Pt with initial SBP drop with transition to sitting despite HTN and reported dizziness with stairs with maintained HTN and elevated HR. Pt reports currently receiving HHPT and continuation of services appropriate. Pt with decreased activity tolerance and mobility who will benefit from acute therapy to maximize independence and safety.  Orthostatic BPs  Supine 179/86, HR63  Sitting 154/85, HR 73     Standing 164/83, HR 75             Recommendations for follow up therapy are one component of a multi-disciplinary discharge planning process, led by the attending physician.  Recommendations may be updated based on patient status, additional functional criteria and insurance authorization.  Follow Up Recommendations       Assistance Recommended at Discharge PRN  Patient can return home with the following  Assistance with cooking/housework;Assist for transportation    Equipment Recommendations None recommended by PT  Recommendations for Other Services       Functional Status Assessment Patient has had a recent decline in their functional status and/or demonstrates limited ability to make significant improvements in function in a reasonable and predictable amount of time     Precautions / Restrictions Precautions Precautions: Fall      Mobility  Bed Mobility Overal bed mobility: Modified Independent                  Transfers Overall transfer level: Modified independent                 General transfer comment: from bed and toilet, min assist to rise  from sitting on stair with rail    Ambulation/Gait Ambulation/Gait assistance: Supervision Gait Distance (Feet): 180 Feet Assistive device: Straight cane Gait Pattern/deviations: Step-through pattern, Decreased stride length, Trunk flexed   Gait velocity interpretation: 1.31 - 2.62 ft/sec, indicative of limited community ambulator   General Gait Details: pt with maintained trunk flexion, with right lean and antalgic gait. pt able to walk with assist of cane with decreased stride and speed. pt with seated rest then return 180' to room  Stairs Stairs: Yes Stairs assistance: Supervision Stair Management: Alternating pattern, Forwards, One rail Right, With cane Number of Stairs: 14 General stair comments: pt with use of cane and rail without physical assist for stair ambulation. Pt fatigued after descent requiring seated rest on stairs to recover prior to return walk to room. BP 173/102, HR 104  Wheelchair Mobility    Modified Rankin (Stroke Patients Only)       Balance Overall balance assessment: Needs assistance Sitting-balance support: No upper extremity supported, Feet supported Sitting balance-Leahy Scale: Fair     Standing balance support: Single extremity supported Standing balance-Leahy Scale: Poor Standing balance comment: use of cane in standing                             Pertinent Vitals/Pain Pain Assessment Pain Assessment: 0-10 Pain Score: 2  Pain Location: left knee Pain Descriptors / Indicators: Aching Pain Intervention(s): Limited activity within patient's tolerance, Monitored during session, Repositioned    Home Living Family/patient expects to be  discharged to:: Private residence Living Arrangements: Other relatives Available Help at Discharge: Family;Available 24 hours/day Type of Home: House Home Access: Stairs to enter   Entrance Stairs-Number of Steps: 1 Alternate Level Stairs-Number of Steps: flight Home Layout: Two level;Able to  live on main level with bedroom/bathroom Home Equipment: Rolling Walker (2 wheels);Cane - single point      Prior Function               Mobility Comments: uses cane ADLs Comments: Sister does the driving, cooking, cleaning, shopping, and managing his meds and finances, but otherwise pt is mod I     Hand Dominance        Extremity/Trunk Assessment   Upper Extremity Assessment Upper Extremity Assessment: Generalized weakness    Lower Extremity Assessment Lower Extremity Assessment: Generalized weakness    Cervical / Trunk Assessment Cervical / Trunk Assessment: Kyphotic  Communication   Communication: No difficulties  Cognition Arousal/Alertness: Awake/alert Behavior During Therapy: WFL for tasks assessed/performed Overall Cognitive Status: Within Functional Limits for tasks assessed                                          General Comments      Exercises     Assessment/Plan    PT Assessment Patient needs continued PT services  PT Problem List Decreased activity tolerance;Decreased balance;Decreased mobility;Cardiopulmonary status limiting activity       PT Treatment Interventions DME instruction;Gait training;Stair training;Functional mobility training;Therapeutic activities;Patient/family education;Therapeutic exercise    PT Goals (Current goals can be found in the Care Plan section)  Acute Rehab PT Goals Patient Stated Goal: return home without feeling dizzy PT Goal Formulation: With patient Time For Goal Achievement: 03/07/23 Potential to Achieve Goals: Fair    Frequency Min 1X/week     Co-evaluation               AM-PAC PT "6 Clicks" Mobility  Outcome Measure Help needed turning from your back to your side while in a flat bed without using bedrails?: None Help needed moving from lying on your back to sitting on the side of a flat bed without using bedrails?: None Help needed moving to and from a bed to a chair  (including a wheelchair)?: A Little Help needed standing up from a chair using your arms (e.g., wheelchair or bedside chair)?: A Little Help needed to walk in hospital room?: A Little Help needed climbing 3-5 steps with a railing? : A Little 6 Click Score: 20    End of Session   Activity Tolerance: Patient tolerated treatment well Patient left: in chair;with call bell/phone within reach;with nursing/sitter in room;with chair alarm set Nurse Communication: Mobility status PT Visit Diagnosis: Other abnormalities of gait and mobility (R26.89);Difficulty in walking, not elsewhere classified (R26.2)    Time: 1610-9604 PT Time Calculation (min) (ACUTE ONLY): 31 min   Charges:   PT Evaluation $PT Eval Moderate Complexity: 1 Mod PT Treatments $Gait Training: 8-22 mins        Merryl Hacker, PT Acute Rehabilitation Services Office: [phone]   Cristine Polio 02/21/2023, 9:19 AM

## 2023-02-21 NOTE — Plan of Care (Signed)

## 2023-02-21 NOTE — Plan of Care (Signed)
Problem: Education: Goal: Knowledge of condition and prescribed therapy will improve 02/21/2023 1107 by Georgiann Mccoy, RN Outcome: Adequate for Discharge 02/21/2023 0955 by Georgiann Mccoy, RN Outcome: Progressing   Problem: Cardiac: Goal: Will achieve and/or maintain adequate cardiac output 02/21/2023 1107 by Georgiann Mccoy, RN Outcome: Adequate for Discharge 02/21/2023 0955 by Georgiann Mccoy, RN Outcome: Progressing   Problem: Physical Regulation: Goal: Complications related to the disease process, condition or treatment will be avoided or minimized 02/21/2023 1107 by Georgiann Mccoy, RN Outcome: Adequate for Discharge 02/21/2023 0955 by Georgiann Mccoy, RN Outcome: Progressing   Problem: Education: Goal: Ability to describe self-care measures that may prevent or decrease complications (Diabetes Survival Skills Education) will improve 02/21/2023 1107 by Georgiann Mccoy, RN Outcome: Adequate for Discharge 02/21/2023 0955 by Georgiann Mccoy, RN Outcome: Progressing Goal: Individualized Educational Video(s) 02/21/2023 1107 by Georgiann Mccoy, RN Outcome: Adequate for Discharge 02/21/2023 0955 by Georgiann Mccoy, RN Outcome: Progressing   Problem: Coping: Goal: Ability to adjust to condition or change in health will improve 02/21/2023 1107 by Georgiann Mccoy, RN Outcome: Adequate for Discharge 02/21/2023 0955 by Georgiann Mccoy, RN Outcome: Progressing   Problem: Fluid Volume: Goal: Ability to maintain a balanced intake and output will improve 02/21/2023 1107 by Georgiann Mccoy, RN Outcome: Adequate for Discharge 02/21/2023 0955 by Georgiann Mccoy, RN Outcome: Progressing   Problem: Health Behavior/Discharge Planning: Goal: Ability to identify and utilize available resources and services will improve 02/21/2023 1107 by Georgiann Mccoy, RN Outcome: Adequate for Discharge 02/21/2023 0955 by Georgiann Mccoy, RN Outcome: Progressing Goal: Ability to manage  health-related needs will improve 02/21/2023 1107 by Georgiann Mccoy, RN Outcome: Adequate for Discharge 02/21/2023 0955 by Georgiann Mccoy, RN Outcome: Progressing   Problem: Metabolic: Goal: Ability to maintain appropriate glucose levels will improve 02/21/2023 1107 by Georgiann Mccoy, RN Outcome: Adequate for Discharge 02/21/2023 0955 by Georgiann Mccoy, RN Outcome: Progressing   Problem: Nutritional: Goal: Maintenance of adequate nutrition will improve 02/21/2023 1107 by Georgiann Mccoy, RN Outcome: Adequate for Discharge 02/21/2023 0955 by Georgiann Mccoy, RN Outcome: Progressing Goal: Progress toward achieving an optimal weight will improve 02/21/2023 1107 by Georgiann Mccoy, RN Outcome: Adequate for Discharge 02/21/2023 0955 by Georgiann Mccoy, RN Outcome: Progressing   Problem: Skin Integrity: Goal: Risk for impaired skin integrity will decrease 02/21/2023 1107 by Georgiann Mccoy, RN Outcome: Adequate for Discharge 02/21/2023 0955 by Georgiann Mccoy, RN Outcome: Progressing   Problem: Tissue Perfusion: Goal: Adequacy of tissue perfusion will improve 02/21/2023 1107 by Georgiann Mccoy, RN Outcome: Adequate for Discharge 02/21/2023 0955 by Georgiann Mccoy, RN Outcome: Progressing   Problem: Education: Goal: Knowledge of General Education information will improve Description: Including pain rating scale, medication(s)/side effects and non-pharmacologic comfort measures 02/21/2023 1107 by Georgiann Mccoy, RN Outcome: Adequate for Discharge 02/21/2023 0955 by Georgiann Mccoy, RN Outcome: Progressing   Problem: Health Behavior/Discharge Planning: Goal: Ability to manage health-related needs will improve 02/21/2023 1107 by Georgiann Mccoy, RN Outcome: Adequate for Discharge 02/21/2023 0955 by Georgiann Mccoy, RN Outcome: Progressing   Problem: Clinical Measurements: Goal: Ability to maintain clinical measurements within normal limits will improve 02/21/2023  1107 by Georgiann Mccoy, RN Outcome: Adequate for Discharge 02/21/2023 0955 by Georgiann Mccoy, RN Outcome: Progressing Goal: Will remain free from infection 02/21/2023 1107 by Georgiann Mccoy, RN Outcome: Adequate for Discharge 02/21/2023 0955 by  Georgiann Mccoy, RN Outcome: Progressing Goal: Diagnostic test results will improve 02/21/2023 1107 by Georgiann Mccoy, RN Outcome: Adequate for Discharge 02/21/2023 0955 by Georgiann Mccoy, RN Outcome: Progressing Goal: Respiratory complications will improve 02/21/2023 1107 by Georgiann Mccoy, RN Outcome: Adequate for Discharge 02/21/2023 0955 by Georgiann Mccoy, RN Outcome: Progressing Goal: Cardiovascular complication will be avoided 02/21/2023 1107 by Georgiann Mccoy, RN Outcome: Adequate for Discharge 02/21/2023 0955 by Georgiann Mccoy, RN Outcome: Progressing   Problem: Activity: Goal: Risk for activity intolerance will decrease 02/21/2023 1107 by Georgiann Mccoy, RN Outcome: Adequate for Discharge 02/21/2023 0955 by Georgiann Mccoy, RN Outcome: Progressing   Problem: Nutrition: Goal: Adequate nutrition will be maintained 02/21/2023 1107 by Georgiann Mccoy, RN Outcome: Adequate for Discharge 02/21/2023 0955 by Georgiann Mccoy, RN Outcome: Progressing   Problem: Coping: Goal: Level of anxiety will decrease 02/21/2023 1107 by Georgiann Mccoy, RN Outcome: Adequate for Discharge 02/21/2023 0955 by Georgiann Mccoy, RN Outcome: Progressing   Problem: Elimination: Goal: Will not experience complications related to bowel motility 02/21/2023 1107 by Georgiann Mccoy, RN Outcome: Adequate for Discharge 02/21/2023 0955 by Georgiann Mccoy, RN Outcome: Progressing Goal: Will not experience complications related to urinary retention 02/21/2023 1107 by Georgiann Mccoy, RN Outcome: Adequate for Discharge 02/21/2023 0955 by Georgiann Mccoy, RN Outcome: Progressing   Problem: Pain Managment: Goal: General experience of comfort  will improve 02/21/2023 1107 by Georgiann Mccoy, RN Outcome: Adequate for Discharge 02/21/2023 0955 by Georgiann Mccoy, RN Outcome: Progressing   Problem: Safety: Goal: Ability to remain free from injury will improve 02/21/2023 1107 by Georgiann Mccoy, RN Outcome: Adequate for Discharge 02/21/2023 0955 by Georgiann Mccoy, RN Outcome: Progressing   Problem: Skin Integrity: Goal: Risk for impaired skin integrity will decrease 02/21/2023 1107 by Georgiann Mccoy, RN Outcome: Adequate for Discharge 02/21/2023 0955 by Georgiann Mccoy, RN Outcome: Progressing

## 2023-02-21 NOTE — TOC Transition Note (Addendum)
Transition of Care [facility]) - CM/SW Discharge Note   Patient Details  Name: Luis Carter MRN: [id number] Date of Birth: 04/14/1946  Transition of Care [facility]) CM/SW Contact:  Ronny Bacon, RN Phone Number: 02/21/2023, 11:44 AM   Clinical Narrative:  Patient being discharged home today. Elnita Maxwell with [facility] confirmed that patient is still active with their service for [facility] PT and is aware patient being discharged home today. Patient reports sister will transport him home at discharge.     Final next level of care: Home w Home Health Services Barriers to Discharge: No Barriers Identified   Patient Goals and CMS Choice      Discharge Placement                         Discharge Plan and Services Additional resources added to the After Visit Summary for                                       Social Determinants of Health (SDOH) Interventions SDOH Screenings   Food Insecurity: No Food Insecurity (02/20/2023)  Housing: Patient Declined (02/20/2023)  Transportation Needs: No Transportation Needs (02/20/2023)  Utilities: Not At Risk (02/20/2023)  Tobacco Use: Medium Risk (02/20/2023)     Readmission Risk Interventions    09/19/2021    2:31 PM  Readmission Risk Prevention Plan  Transportation Screening Complete  PCP or Specialist Appt within 3-5 Days Complete  HRI or Home Care Consult Complete  Social Work Consult for Recovery Care Planning/Counseling Patient refused  Palliative Care Screening Not Applicable  Medication Review [employer]) Complete

## 2023-02-21 NOTE — Progress Notes (Signed)
   Rounding Note    Patient Name: Luis Carter Date of Encounter: 02/21/2023  [facility] Cardiologist: None   Subjective   No acute events overnight.  Feels well this morning.  Lying flat in bed.  Vital Signs    Vitals:   02/20/23 2353 02/21/23 0030 02/21/23 0339 02/21/23 0806  BP: (!) 190/84  (!) 187/75 (!) 158/80  Pulse: 61 65 63 63  Resp:  19 18 16   Temp: 98.1 F (36.7 C)  97.9 F (36.6 C) 98.7 F (37.1 C)  TempSrc: Oral  Oral Oral  SpO2: 100% 99% 99% 98%  Weight:   76.1 kg   Height:        Intake/Output Summary (Last 24 hours) at 02/21/2023 0834 Last data filed at 02/21/2023 0344 Gross per 24 hour  Intake 154.58 ml  Output 1000 ml  Net -845.42 ml      02/21/2023    3:39 AM 02/20/2023   12:28 PM 01/09/2023    4:02 AM  Last 3 Weights  Weight (lbs) 167 lb 11.2 oz 182 lb 5.1 oz 182 lb 5.1 oz  Weight (kg) 76.068 kg 82.7 kg 82.7 kg      Telemetry    No significant bradycardic events.  There is some sinus arrhythmia.  No evidence of AV block.- Personally Reviewed  ECG    Personally Reviewed  Physical Exam    GEN: No acute distress.   Cardiac: RRR, no murmurs, rubs, or gallops.  Psych: Normal affect   Assessment & Plan    #Sinus bradycardia Unclear cause.  On telemetry, there is sinus arrhythmia but no sustained bradycardic episodes.  After discussing his situation with the patient, favor pursuing a watchful waiting approach.  I would not proceed with permanent pacing at this moment.  Plan for outpatient follow-up with loop recorder implant at the follow-up appointment for further monitoring and to allow for possible correlation between symptoms and arrhythmia.  Okay to discharge from an EP perspective.  EP will sign off.  Please call with questions or concerns.  Sheria Lang T. Lalla Brothers, MD, [facility], [facility] Cardiac Electrophysiology
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
## 2023-02-22 ENCOUNTER — Other Ambulatory Visit: Payer: Self-pay

## 2023-02-22 ENCOUNTER — Emergency Department (HOSPITAL_COMMUNITY)
Admission: EM | Admit: 2023-02-22 | Discharge: 2023-02-23 | Disposition: A | Discharge status: Home / Self Care | Payer: 59 | Attending: Emergency Medicine | Admitting: Emergency Medicine

## 2023-02-22 ENCOUNTER — Encounter (HOSPITAL_COMMUNITY): Payer: Self-pay

## 2023-02-22 ENCOUNTER — Emergency Department (HOSPITAL_COMMUNITY): Payer: 59

## 2023-02-22 DIAGNOSIS — R0682 Tachypnea, not elsewhere classified: Secondary | ICD-10-CM | POA: Insufficient documentation

## 2023-02-22 DIAGNOSIS — R9431 Abnormal electrocardiogram [ECG] [EKG]: Secondary | ICD-10-CM | POA: Insufficient documentation

## 2023-02-22 DIAGNOSIS — J9811 Atelectasis: Secondary | ICD-10-CM | POA: Insufficient documentation

## 2023-02-22 DIAGNOSIS — R911 Solitary pulmonary nodule: Secondary | ICD-10-CM | POA: Insufficient documentation

## 2023-02-22 DIAGNOSIS — I2510 Atherosclerotic heart disease of native coronary artery without angina pectoris: Secondary | ICD-10-CM | POA: Diagnosis not present

## 2023-02-22 DIAGNOSIS — Z7982 Long term (current) use of aspirin: Secondary | ICD-10-CM | POA: Diagnosis not present

## 2023-02-22 DIAGNOSIS — R079 Chest pain, unspecified: Secondary | ICD-10-CM | POA: Insufficient documentation

## 2023-02-22 DIAGNOSIS — Z7951 Long term (current) use of inhaled steroids: Secondary | ICD-10-CM | POA: Insufficient documentation

## 2023-02-22 DIAGNOSIS — E119 Type 2 diabetes mellitus without complications: Secondary | ICD-10-CM | POA: Insufficient documentation

## 2023-02-22 DIAGNOSIS — Z7984 Long term (current) use of oral hypoglycemic drugs: Secondary | ICD-10-CM | POA: Diagnosis not present

## 2023-02-22 DIAGNOSIS — I700 Atherosclerosis of aorta: Secondary | ICD-10-CM | POA: Diagnosis not present

## 2023-02-22 DIAGNOSIS — R0789 Other chest pain: Secondary | ICD-10-CM | POA: Diagnosis not present

## 2023-02-22 DIAGNOSIS — R918 Other nonspecific abnormal finding of lung field: Secondary | ICD-10-CM | POA: Diagnosis not present

## 2023-02-22 DIAGNOSIS — R1032 Left lower quadrant pain: Secondary | ICD-10-CM | POA: Diagnosis not present

## 2023-02-22 DIAGNOSIS — I10 Essential (primary) hypertension: Secondary | ICD-10-CM | POA: Diagnosis not present

## 2023-02-22 DIAGNOSIS — J449 Chronic obstructive pulmonary disease, unspecified: Secondary | ICD-10-CM | POA: Insufficient documentation

## 2023-02-22 DIAGNOSIS — E785 Hyperlipidemia, unspecified: Secondary | ICD-10-CM | POA: Diagnosis not present

## 2023-02-22 DIAGNOSIS — R109 Unspecified abdominal pain: Secondary | ICD-10-CM | POA: Insufficient documentation

## 2023-02-22 DIAGNOSIS — Z79899 Other long term (current) drug therapy: Secondary | ICD-10-CM | POA: Diagnosis not present

## 2023-02-22 DIAGNOSIS — N323 Diverticulum of bladder: Secondary | ICD-10-CM | POA: Diagnosis not present

## 2023-02-22 DIAGNOSIS — N281 Cyst of kidney, acquired: Secondary | ICD-10-CM | POA: Diagnosis not present

## 2023-02-22 DIAGNOSIS — J432 Centrilobular emphysema: Secondary | ICD-10-CM | POA: Diagnosis not present

## 2023-02-22 DIAGNOSIS — R001 Bradycardia, unspecified: Secondary | ICD-10-CM | POA: Diagnosis not present

## 2023-02-22 HISTORY — DX: Heart failure, unspecified: I50.9

## 2023-02-22 LAB — BASIC METABOLIC PANEL
Anion gap: 14 (ref 5–15)
BUN: 23 mg/dL (ref 8–23)
CO2: 22 mmol/L (ref 22–32)
Calcium: 9.3 mg/dL (ref 8.9–10.3)
Chloride: 100 mmol/L (ref 98–111)
Creatinine, Ser: 1.7 mg/dL — ABNORMAL HIGH (ref 0.61–1.24)
GFR, Estimated: 41 mL/min — ABNORMAL LOW (ref >60)
Glucose, Bld: 93 mg/dL (ref 70–99)
Potassium: 3.8 mmol/L (ref 3.5–5.1)
Sodium: 136 mmol/L (ref 135–145)

## 2023-02-22 LAB — CBC
HCT: 38.2 % — ABNORMAL LOW (ref 39.0–52.0)
Hemoglobin: 11.8 g/dL — ABNORMAL LOW (ref 13.0–17.0)
MCH: 26 pg (ref 26.0–34.0)
MCHC: 30.9 g/dL (ref 30.0–36.0)
MCV: 84.1 fL (ref 80.0–100.0)
Platelets: 209 10*3/uL (ref 150–400)
RBC: 4.54 MIL/uL (ref 4.22–5.81)
RDW: 16 % — ABNORMAL HIGH (ref 11.5–15.5)
WBC: 6.4 10*3/uL (ref 4.0–10.5)
nRBC: 0 % (ref 0.0–0.2)

## 2023-02-22 LAB — BRAIN NATRIURETIC PEPTIDE: B Natriuretic Peptide: 128.9 pg/mL — ABNORMAL HIGH (ref 0.0–100.0)

## 2023-02-22 LAB — TROPONIN I (HIGH SENSITIVITY): Troponin I (High Sensitivity): 24 ng/L — ABNORMAL HIGH (ref <18)

## 2023-02-22 MED ORDER — IOHEXOL 350 MG/ML SOLN
75.0000 mL | Freq: Once | INTRAVENOUS | Status: AC | PRN
  Administered 2023-02-22: 75 mL via INTRAVENOUS

## 2023-02-22 MED ORDER — FENTANYL CITRATE PF 50 MCG/ML IJ SOSY
50.0000 ug | PREFILLED_SYRINGE | Freq: Once | INTRAMUSCULAR | Status: AC
  Administered 2023-02-23: 50 ug via INTRAVENOUS
  Filled 2023-02-22: qty 1

## 2023-02-22 MED ORDER — LACTATED RINGERS IV SOLN: INTRAVENOUS | Status: DC

## 2023-02-22 NOTE — ED Triage Notes (Signed)
Pt arrived from home via POV c/o chest pain 10/10. Pt recently discharged from inpatient care. Pt describes the pain as aching. Denies SOB, but resp are noted to be labored and tachy.

## 2023-02-22 NOTE — ED Provider Notes (Signed)
[place] EMERGENCY DEPARTMENT AT [facility] Provider Note   CSN: [id number] Arrival date & time: 02/22/23  2113     History {Add pertinent medical, surgical, social history, OB history to HPI:1} Chief Complaint  Patient presents with  . Chest Pain    Luis Carter is a 77 y.o. male.  77 y/o male with hx of DM, CAD, HTN, COPD, HLD presents to the ED for c/o pain. Triage note references chest pain, but patient points to his periumbilical region and lower abdomen when questioned about pain location. States it has been constant since this AM; started when family was getting ready to go to church. He has not taken any medications for his pain, but did take his daily meds. Nothing makes symptoms better or worse. No SOB, N/V, bowel changes, fever, back pain.  The history is provided by the patient, a relative and the spouse.  Chest Pain      Home Medications Prior to Admission medications   Medication Sig Start Date End Date Taking? Authorizing Provider  albuterol (PROAIR HFA) 108 (90 BASE) MCG/ACT inhaler INHALE 2 PUFFS INTO THE LUNGS EVERY 6 (SIX) HOURS AS NEEDED FOR WHEEZING. Patient taking differently: Inhale 2 puffs into the lungs every 6 (six) hours as needed for shortness of breath. 03/05/15   Gordy Savers, MD  allopurinol (ZYLOPRIM) 100 MG tablet Take 2 tablets (200 mg total) by mouth daily. 03/05/15   Gordy Savers, MD  aspirin ([employer] ASPIRIN LOW DOSE) 81 MG EC tablet Take 1 tablet (81 mg total) by mouth daily. Swallow whole. 03/16/21   Leroy Sea, MD  atorvastatin (LIPITOR) 20 MG tablet TAKE 1 TABLET BY MOUTH EVERY DAY Patient taking differently: Take 20 mg by mouth at bedtime. 06/02/16   Gordy Savers, MD  diclofenac Sodium (VOLTAREN) 1 % GEL Apply 4 g topically 4 (four) times daily as needed (pain). 01/01/23   [provider]  fluticasone (FLONASE) 50 MCG/ACT nasal spray Place 2 sprays into both nostrils 2 (two) times daily.  02/13/21   Arrien, York Ram, MD  fluticasone-salmeterol (ADVAIR) 250-50 MCG/ACT AEPB Inhale 1 puff into the lungs in the morning and at bedtime. 02/13/21   Arrien, York Ram, MD  gabapentin (NEURONTIN) 300 MG capsule Take 1 capsule (300 mg total) by mouth 2 (two) times daily. 01/09/23 02/08/23  Arrien, York Ram, MD  hydrALAZINE (APRESOLINE) 25 MG tablet Take 1 tablet (25 mg total) by mouth every 8 (eight) hours. 02/21/23   Burnadette Pop, MD  ipratropium-albuterol (DUONEB) 0.5-2.5 (3) MG/3ML SOLN Take 3 mLs by nebulization every 6 (six) hours as needed. Patient taking differently: Take 3 mLs by nebulization every 6 (six) hours as needed (shortness of breath). 02/13/21   Arrien, York Ram, MD  lisinopril (ZESTRIL) 20 MG tablet Take 1 tablet (20 mg total) by mouth daily. 02/22/23   Burnadette Pop, MD  metFORMIN (GLUCOPHAGE) 500 MG tablet Take 1 tablet (500 mg total) by mouth 2 (two) times daily. 03/20/22 04/19/22  Dorcas Carrow, MD  montelukast (SINGULAIR) 10 MG tablet Take 10 mg by mouth daily. 12/10/22   [provider]  omeprazole (PRILOSEC) 20 MG capsule Take 1 capsule (20 mg total) by mouth daily. 03/05/15   Gordy Savers, MD  tiZANidine (ZANAFLEX) 4 MG tablet Take 4 mg by mouth 2 (two) times daily as needed for muscle spasms. 10/16/22   [provider]  Vibegron (GEMTESA) 75 MG TABS Take 75 mg by mouth daily.  [provider]  Vitamin D, Ergocalciferol, (DRISDOL) 1.25 MG (50000 UNIT) CAPS capsule Take 50,000 Units by mouth every Monday.    [provider]      Allergies    Patient has no known allergies.    Review of Systems   Review of Systems  Cardiovascular:  Positive for chest pain.  Ten systems reviewed and are negative for acute change, except as noted in the HPI.    Physical Exam Updated Vital Signs BP (!) 144/64   Pulse 91   Temp 98.9 F (37.2 C) (Oral)   Resp 20   Ht 5\' 9"  (1.753 m)   Wt 76.1 kg   SpO2 99%    BMI 24.76 kg/m   Physical Exam Vitals and nursing note reviewed.  Constitutional:      General: He is not in acute distress.    Appearance: He is well-developed. He is not diaphoretic.     Comments: Patient agitated and crying out due to pain; challenging to redirect.  HENT:     Head: Normocephalic and atraumatic.  Eyes:     General: No scleral icterus.    Conjunctiva/sclera: Conjunctivae normal.  Cardiovascular:     Rate and Rhythm: Normal rate and regular rhythm.     Pulses: Normal pulses.  Pulmonary:     Effort: Pulmonary effort is normal. No respiratory distress.     Breath sounds: No stridor. No wheezing.     Comments: Respirations even and unlabored. Lungs CTAB. Abdominal:     Palpations: Abdomen is soft. There is no mass.     Tenderness: There is no guarding.     Comments: Soft abdomen without guarding on palpation; no discernable focal tenderness. No peritoneal signs or palpable masses.  Musculoskeletal:        General: Normal range of motion.     Cervical back: Normal range of motion.  Skin:    General: Skin is warm and dry.     Coloration: Skin is not pale.     Findings: No erythema or rash.  Neurological:     Mental Status: He is alert and oriented to person, place, and time.     Coordination: Coordination normal.     Comments: Moving all extremities spontaneously.  Psychiatric:        Behavior: Behavior normal.     ED Results / Procedures / Treatments   Labs (all labs ordered are listed, but only abnormal results are displayed) Labs Reviewed  BASIC METABOLIC PANEL - Abnormal; Notable for the following components:      Result Value   Creatinine, Ser 1.70 (*)    GFR, Estimated 41 (*)    All other components within normal limits  CBC - Abnormal; Notable for the following components:   Hemoglobin 11.8 (*)    HCT 38.2 (*)    RDW 16.0 (*)    All other components within normal limits  BRAIN NATRIURETIC PEPTIDE - Abnormal; Notable for the following  components:   B Natriuretic Peptide 128.9 (*)    All other components within normal limits  TROPONIN I (HIGH SENSITIVITY) - Abnormal; Notable for the following components:   Troponin I (High Sensitivity) 24 (*)    All other components within normal limits    EKG None  Radiology DG Chest 2 View  Result Date: 02/22/2023 CLINICAL DATA:  Chest pain EXAM: CHEST - 2 VIEW COMPARISON:  02/20/2023 FINDINGS: Lungs are clear.  No pleural effusion or pneumothorax. The heart is normal in size.  Shrapnel overlying the left lateral chest/axilla. Visualized osseous structures are within normal limits. IMPRESSION: Normal chest radiographs. Electronically Signed   By: Charline Bills M.D.   On: 02/22/2023 22:07    Procedures Procedures  {Document cardiac monitor, telemetry assessment procedure when appropriate:1}  Medications Ordered in ED Medications - No data to display  ED Course/ Medical Decision Making/ A&P Clinical Course as of 02/22/23 2247  Sun Feb 22, 2023  2245 Appears patient did have evidence of ascending aortic aneurysm on echocardiogram from 1 month ago; minor, measuring 41mm. Given degree of pain, the fact that the patient is a challenging historian and unreliable, will proceed with CTA imaging from the chest into the abdomen. [KH]    Clinical Course User Index [KH] Antony Madura, PA-C   {   Click here for ABCD2, HEART and other calculatorsREFRESH Note before signing :1}                          Medical Decision Making Amount and/or Complexity of Data Reviewed Labs: ordered. Radiology: ordered.   ***  {Document critical care time when appropriate:1} {Document review of labs and clinical decision tools ie heart score, Chads2Vasc2 etc:1}  {Document your independent review of radiology images, and any outside records:1} {Document your discussion with family members, caretakers, and with consultants:1} {Document social determinants of health affecting pt's care:1} {Document  your decision making why or why not admission, treatments were needed:1} Final Clinical Impression(s) / ED Diagnoses Final diagnoses:  None    Rx / DC Orders ED Discharge Orders     None

## 2023-02-22 NOTE — ED Notes (Signed)
Patient presents via wc to room c/o chest pain that started last night. When asked to point to area of pain patient points to left lower abdominal quadrant. Patient poor historian unable to describe pain continually states oh Jesus help me . Patient a/o x 4 respirations even and non labored abdomen soft and non tender to palpitations vs wnl. Patient took all home medications prior to arrival.

## 2023-02-23 ENCOUNTER — Ambulatory Visit (INDEPENDENT_AMBULATORY_CARE_PROVIDER_SITE_OTHER): Payer: 59 | Admitting: Podiatry

## 2023-02-23 DIAGNOSIS — I10 Essential (primary) hypertension: Secondary | ICD-10-CM | POA: Diagnosis not present

## 2023-02-23 DIAGNOSIS — E1142 Type 2 diabetes mellitus with diabetic polyneuropathy: Secondary | ICD-10-CM | POA: Diagnosis not present

## 2023-02-23 DIAGNOSIS — M47812 Spondylosis without myelopathy or radiculopathy, cervical region: Secondary | ICD-10-CM | POA: Diagnosis not present

## 2023-02-23 DIAGNOSIS — R079 Chest pain, unspecified: Secondary | ICD-10-CM | POA: Diagnosis not present

## 2023-02-23 DIAGNOSIS — Z91199 Patient's noncompliance with other medical treatment and regimen due to unspecified reason: Secondary | ICD-10-CM

## 2023-02-23 DIAGNOSIS — J449 Chronic obstructive pulmonary disease, unspecified: Secondary | ICD-10-CM | POA: Diagnosis not present

## 2023-02-23 DIAGNOSIS — I498 Other specified cardiac arrhythmias: Secondary | ICD-10-CM | POA: Diagnosis not present

## 2023-02-23 LAB — TROPONIN I (HIGH SENSITIVITY): Troponin I (High Sensitivity): 23 ng/L — ABNORMAL HIGH (ref <18)

## 2023-02-23 NOTE — Progress Notes (Signed)
No show

## 2023-02-23 NOTE — Discharge Instructions (Signed)
Your workup in the emergency department was stable compared with prior work ups. Your imaging did not show any concerning process in your chest, abdomen, pelvis.  We recommend close follow-up with your primary care doctor.  Continue outpatient cardiology evaluation for placement of a loop recorder.  Return to the ED for new or concerning symptoms.

## 2023-02-24 DIAGNOSIS — E1169 Type 2 diabetes mellitus with other specified complication: Secondary | ICD-10-CM | POA: Diagnosis not present

## 2023-02-24 DIAGNOSIS — Z87440 Personal history of urinary (tract) infections: Secondary | ICD-10-CM | POA: Diagnosis not present

## 2023-02-24 DIAGNOSIS — M5440 Lumbago with sciatica, unspecified side: Secondary | ICD-10-CM | POA: Diagnosis not present

## 2023-02-24 DIAGNOSIS — G8929 Other chronic pain: Secondary | ICD-10-CM | POA: Diagnosis not present

## 2023-02-24 DIAGNOSIS — E1165 Type 2 diabetes mellitus with hyperglycemia: Secondary | ICD-10-CM | POA: Diagnosis not present

## 2023-02-24 DIAGNOSIS — E1122 Type 2 diabetes mellitus with diabetic chronic kidney disease: Secondary | ICD-10-CM | POA: Diagnosis not present

## 2023-02-24 DIAGNOSIS — Z7951 Long term (current) use of inhaled steroids: Secondary | ICD-10-CM | POA: Diagnosis not present

## 2023-02-24 DIAGNOSIS — E785 Hyperlipidemia, unspecified: Secondary | ICD-10-CM | POA: Diagnosis not present

## 2023-02-24 DIAGNOSIS — N1832 Chronic kidney disease, stage 3b: Secondary | ICD-10-CM | POA: Diagnosis not present

## 2023-02-24 DIAGNOSIS — J441 Chronic obstructive pulmonary disease with (acute) exacerbation: Secondary | ICD-10-CM | POA: Diagnosis not present

## 2023-02-24 DIAGNOSIS — I5033 Acute on chronic diastolic (congestive) heart failure: Secondary | ICD-10-CM | POA: Diagnosis not present

## 2023-02-24 DIAGNOSIS — I130 Hypertensive heart and chronic kidney disease with heart failure and stage 1 through stage 4 chronic kidney disease, or unspecified chronic kidney disease: Secondary | ICD-10-CM | POA: Diagnosis not present

## 2023-02-25 DIAGNOSIS — J440 Chronic obstructive pulmonary disease with acute lower respiratory infection: Secondary | ICD-10-CM | POA: Diagnosis not present

## 2023-03-03 DIAGNOSIS — Z7951 Long term (current) use of inhaled steroids: Secondary | ICD-10-CM | POA: Diagnosis not present

## 2023-03-03 DIAGNOSIS — E785 Hyperlipidemia, unspecified: Secondary | ICD-10-CM | POA: Diagnosis not present

## 2023-03-03 DIAGNOSIS — I5033 Acute on chronic diastolic (congestive) heart failure: Secondary | ICD-10-CM | POA: Diagnosis not present

## 2023-03-03 DIAGNOSIS — E1169 Type 2 diabetes mellitus with other specified complication: Secondary | ICD-10-CM | POA: Diagnosis not present

## 2023-03-03 DIAGNOSIS — Z87440 Personal history of urinary (tract) infections: Secondary | ICD-10-CM | POA: Diagnosis not present

## 2023-03-03 DIAGNOSIS — I130 Hypertensive heart and chronic kidney disease with heart failure and stage 1 through stage 4 chronic kidney disease, or unspecified chronic kidney disease: Secondary | ICD-10-CM | POA: Diagnosis not present

## 2023-03-03 DIAGNOSIS — J441 Chronic obstructive pulmonary disease with (acute) exacerbation: Secondary | ICD-10-CM | POA: Diagnosis not present

## 2023-03-03 DIAGNOSIS — N1832 Chronic kidney disease, stage 3b: Secondary | ICD-10-CM | POA: Diagnosis not present

## 2023-03-03 DIAGNOSIS — E1122 Type 2 diabetes mellitus with diabetic chronic kidney disease: Secondary | ICD-10-CM | POA: Diagnosis not present

## 2023-03-03 DIAGNOSIS — G8929 Other chronic pain: Secondary | ICD-10-CM | POA: Diagnosis not present

## 2023-03-03 DIAGNOSIS — E1165 Type 2 diabetes mellitus with hyperglycemia: Secondary | ICD-10-CM | POA: Diagnosis not present

## 2023-03-03 DIAGNOSIS — M5440 Lumbago with sciatica, unspecified side: Secondary | ICD-10-CM | POA: Diagnosis not present

## 2023-03-05 DIAGNOSIS — Z7951 Long term (current) use of inhaled steroids: Secondary | ICD-10-CM | POA: Diagnosis not present

## 2023-03-05 DIAGNOSIS — E785 Hyperlipidemia, unspecified: Secondary | ICD-10-CM | POA: Diagnosis not present

## 2023-03-05 DIAGNOSIS — M5440 Lumbago with sciatica, unspecified side: Secondary | ICD-10-CM | POA: Diagnosis not present

## 2023-03-05 DIAGNOSIS — J441 Chronic obstructive pulmonary disease with (acute) exacerbation: Secondary | ICD-10-CM | POA: Diagnosis not present

## 2023-03-05 DIAGNOSIS — N1832 Chronic kidney disease, stage 3b: Secondary | ICD-10-CM | POA: Diagnosis not present

## 2023-03-05 DIAGNOSIS — Z87440 Personal history of urinary (tract) infections: Secondary | ICD-10-CM | POA: Diagnosis not present

## 2023-03-05 DIAGNOSIS — E1122 Type 2 diabetes mellitus with diabetic chronic kidney disease: Secondary | ICD-10-CM | POA: Diagnosis not present

## 2023-03-05 DIAGNOSIS — I5033 Acute on chronic diastolic (congestive) heart failure: Secondary | ICD-10-CM | POA: Diagnosis not present

## 2023-03-05 DIAGNOSIS — E1165 Type 2 diabetes mellitus with hyperglycemia: Secondary | ICD-10-CM | POA: Diagnosis not present

## 2023-03-05 DIAGNOSIS — I130 Hypertensive heart and chronic kidney disease with heart failure and stage 1 through stage 4 chronic kidney disease, or unspecified chronic kidney disease: Secondary | ICD-10-CM | POA: Diagnosis not present

## 2023-03-05 DIAGNOSIS — E1169 Type 2 diabetes mellitus with other specified complication: Secondary | ICD-10-CM | POA: Diagnosis not present

## 2023-03-05 DIAGNOSIS — G8929 Other chronic pain: Secondary | ICD-10-CM | POA: Diagnosis not present

## 2023-03-10 DIAGNOSIS — Z7951 Long term (current) use of inhaled steroids: Secondary | ICD-10-CM | POA: Diagnosis not present

## 2023-03-10 DIAGNOSIS — N1832 Chronic kidney disease, stage 3b: Secondary | ICD-10-CM | POA: Diagnosis not present

## 2023-03-10 DIAGNOSIS — G8929 Other chronic pain: Secondary | ICD-10-CM | POA: Diagnosis not present

## 2023-03-10 DIAGNOSIS — J441 Chronic obstructive pulmonary disease with (acute) exacerbation: Secondary | ICD-10-CM | POA: Diagnosis not present

## 2023-03-10 DIAGNOSIS — E1169 Type 2 diabetes mellitus with other specified complication: Secondary | ICD-10-CM | POA: Diagnosis not present

## 2023-03-10 DIAGNOSIS — E1122 Type 2 diabetes mellitus with diabetic chronic kidney disease: Secondary | ICD-10-CM | POA: Diagnosis not present

## 2023-03-10 DIAGNOSIS — E1165 Type 2 diabetes mellitus with hyperglycemia: Secondary | ICD-10-CM | POA: Diagnosis not present

## 2023-03-10 DIAGNOSIS — E785 Hyperlipidemia, unspecified: Secondary | ICD-10-CM | POA: Diagnosis not present

## 2023-03-10 DIAGNOSIS — I130 Hypertensive heart and chronic kidney disease with heart failure and stage 1 through stage 4 chronic kidney disease, or unspecified chronic kidney disease: Secondary | ICD-10-CM | POA: Diagnosis not present

## 2023-03-10 DIAGNOSIS — I5033 Acute on chronic diastolic (congestive) heart failure: Secondary | ICD-10-CM | POA: Diagnosis not present

## 2023-03-10 DIAGNOSIS — Z87440 Personal history of urinary (tract) infections: Secondary | ICD-10-CM | POA: Diagnosis not present

## 2023-03-10 DIAGNOSIS — M5440 Lumbago with sciatica, unspecified side: Secondary | ICD-10-CM | POA: Diagnosis not present

## 2023-03-12 DIAGNOSIS — M179 Osteoarthritis of knee, unspecified: Secondary | ICD-10-CM | POA: Diagnosis not present

## 2023-03-12 DIAGNOSIS — J449 Chronic obstructive pulmonary disease, unspecified: Secondary | ICD-10-CM | POA: Diagnosis not present

## 2023-03-12 DIAGNOSIS — I10 Essential (primary) hypertension: Secondary | ICD-10-CM | POA: Diagnosis not present

## 2023-03-12 DIAGNOSIS — E1142 Type 2 diabetes mellitus with diabetic polyneuropathy: Secondary | ICD-10-CM | POA: Diagnosis not present

## 2023-03-13 DIAGNOSIS — E785 Hyperlipidemia, unspecified: Secondary | ICD-10-CM | POA: Diagnosis not present

## 2023-03-13 DIAGNOSIS — I5033 Acute on chronic diastolic (congestive) heart failure: Secondary | ICD-10-CM | POA: Diagnosis not present

## 2023-03-13 DIAGNOSIS — Z87440 Personal history of urinary (tract) infections: Secondary | ICD-10-CM | POA: Diagnosis not present

## 2023-03-13 DIAGNOSIS — E1169 Type 2 diabetes mellitus with other specified complication: Secondary | ICD-10-CM | POA: Diagnosis not present

## 2023-03-13 DIAGNOSIS — E1122 Type 2 diabetes mellitus with diabetic chronic kidney disease: Secondary | ICD-10-CM | POA: Diagnosis not present

## 2023-03-13 DIAGNOSIS — I130 Hypertensive heart and chronic kidney disease with heart failure and stage 1 through stage 4 chronic kidney disease, or unspecified chronic kidney disease: Secondary | ICD-10-CM | POA: Diagnosis not present

## 2023-03-13 DIAGNOSIS — J441 Chronic obstructive pulmonary disease with (acute) exacerbation: Secondary | ICD-10-CM | POA: Diagnosis not present

## 2023-03-13 DIAGNOSIS — M5440 Lumbago with sciatica, unspecified side: Secondary | ICD-10-CM | POA: Diagnosis not present

## 2023-03-13 DIAGNOSIS — E1165 Type 2 diabetes mellitus with hyperglycemia: Secondary | ICD-10-CM | POA: Diagnosis not present

## 2023-03-13 DIAGNOSIS — Z7951 Long term (current) use of inhaled steroids: Secondary | ICD-10-CM | POA: Diagnosis not present

## 2023-03-13 DIAGNOSIS — G8929 Other chronic pain: Secondary | ICD-10-CM | POA: Diagnosis not present

## 2023-03-13 DIAGNOSIS — N1832 Chronic kidney disease, stage 3b: Secondary | ICD-10-CM | POA: Diagnosis not present

## 2023-03-14 DIAGNOSIS — I5033 Acute on chronic diastolic (congestive) heart failure: Secondary | ICD-10-CM | POA: Diagnosis not present

## 2023-03-14 DIAGNOSIS — E1122 Type 2 diabetes mellitus with diabetic chronic kidney disease: Secondary | ICD-10-CM | POA: Diagnosis not present

## 2023-03-14 DIAGNOSIS — I130 Hypertensive heart and chronic kidney disease with heart failure and stage 1 through stage 4 chronic kidney disease, or unspecified chronic kidney disease: Secondary | ICD-10-CM | POA: Diagnosis not present

## 2023-03-19 DIAGNOSIS — I10 Essential (primary) hypertension: Secondary | ICD-10-CM | POA: Diagnosis not present

## 2023-03-19 DIAGNOSIS — J449 Chronic obstructive pulmonary disease, unspecified: Secondary | ICD-10-CM | POA: Diagnosis not present

## 2023-03-19 DIAGNOSIS — E1142 Type 2 diabetes mellitus with diabetic polyneuropathy: Secondary | ICD-10-CM | POA: Diagnosis not present

## 2023-03-19 DIAGNOSIS — M179 Osteoarthritis of knee, unspecified: Secondary | ICD-10-CM | POA: Diagnosis not present

## 2023-03-20 DIAGNOSIS — M5440 Lumbago with sciatica, unspecified side: Secondary | ICD-10-CM | POA: Diagnosis not present

## 2023-03-20 DIAGNOSIS — G8929 Other chronic pain: Secondary | ICD-10-CM | POA: Diagnosis not present

## 2023-03-20 DIAGNOSIS — Z87440 Personal history of urinary (tract) infections: Secondary | ICD-10-CM | POA: Diagnosis not present

## 2023-03-20 DIAGNOSIS — E1165 Type 2 diabetes mellitus with hyperglycemia: Secondary | ICD-10-CM | POA: Diagnosis not present

## 2023-03-20 DIAGNOSIS — I130 Hypertensive heart and chronic kidney disease with heart failure and stage 1 through stage 4 chronic kidney disease, or unspecified chronic kidney disease: Secondary | ICD-10-CM | POA: Diagnosis not present

## 2023-03-20 DIAGNOSIS — Z7951 Long term (current) use of inhaled steroids: Secondary | ICD-10-CM | POA: Diagnosis not present

## 2023-03-20 DIAGNOSIS — I5033 Acute on chronic diastolic (congestive) heart failure: Secondary | ICD-10-CM | POA: Diagnosis not present

## 2023-03-20 DIAGNOSIS — E1122 Type 2 diabetes mellitus with diabetic chronic kidney disease: Secondary | ICD-10-CM | POA: Diagnosis not present

## 2023-03-20 DIAGNOSIS — E1169 Type 2 diabetes mellitus with other specified complication: Secondary | ICD-10-CM | POA: Diagnosis not present

## 2023-03-20 DIAGNOSIS — E785 Hyperlipidemia, unspecified: Secondary | ICD-10-CM | POA: Diagnosis not present

## 2023-03-20 DIAGNOSIS — J441 Chronic obstructive pulmonary disease with (acute) exacerbation: Secondary | ICD-10-CM | POA: Diagnosis not present

## 2023-03-20 DIAGNOSIS — N1832 Chronic kidney disease, stage 3b: Secondary | ICD-10-CM | POA: Diagnosis not present

## 2023-03-24 DIAGNOSIS — I5033 Acute on chronic diastolic (congestive) heart failure: Secondary | ICD-10-CM | POA: Diagnosis not present

## 2023-03-24 DIAGNOSIS — E1122 Type 2 diabetes mellitus with diabetic chronic kidney disease: Secondary | ICD-10-CM | POA: Diagnosis not present

## 2023-03-24 DIAGNOSIS — Z7951 Long term (current) use of inhaled steroids: Secondary | ICD-10-CM | POA: Diagnosis not present

## 2023-03-24 DIAGNOSIS — I130 Hypertensive heart and chronic kidney disease with heart failure and stage 1 through stage 4 chronic kidney disease, or unspecified chronic kidney disease: Secondary | ICD-10-CM | POA: Diagnosis not present

## 2023-03-24 DIAGNOSIS — J441 Chronic obstructive pulmonary disease with (acute) exacerbation: Secondary | ICD-10-CM | POA: Diagnosis not present

## 2023-03-24 DIAGNOSIS — E1169 Type 2 diabetes mellitus with other specified complication: Secondary | ICD-10-CM | POA: Diagnosis not present

## 2023-03-24 DIAGNOSIS — N1832 Chronic kidney disease, stage 3b: Secondary | ICD-10-CM | POA: Diagnosis not present

## 2023-03-24 DIAGNOSIS — Z87440 Personal history of urinary (tract) infections: Secondary | ICD-10-CM | POA: Diagnosis not present

## 2023-03-24 DIAGNOSIS — M5440 Lumbago with sciatica, unspecified side: Secondary | ICD-10-CM | POA: Diagnosis not present

## 2023-03-24 DIAGNOSIS — E1165 Type 2 diabetes mellitus with hyperglycemia: Secondary | ICD-10-CM | POA: Diagnosis not present

## 2023-03-24 DIAGNOSIS — G8929 Other chronic pain: Secondary | ICD-10-CM | POA: Diagnosis not present

## 2023-03-24 DIAGNOSIS — E785 Hyperlipidemia, unspecified: Secondary | ICD-10-CM | POA: Diagnosis not present

## 2023-03-28 DIAGNOSIS — J440 Chronic obstructive pulmonary disease with acute lower respiratory infection: Secondary | ICD-10-CM | POA: Diagnosis not present

## 2023-03-30 ENCOUNTER — Encounter: Payer: Self-pay | Admitting: Cardiology

## 2023-03-30 ENCOUNTER — Ambulatory Visit: Payer: 59 | Attending: Cardiology | Admitting: Cardiology

## 2023-03-30 VITALS — BP 152/84 | HR 69 | Ht 63.0 in | Wt 179.8 lb

## 2023-03-30 DIAGNOSIS — J449 Chronic obstructive pulmonary disease, unspecified: Secondary | ICD-10-CM | POA: Diagnosis not present

## 2023-03-30 DIAGNOSIS — I10 Essential (primary) hypertension: Secondary | ICD-10-CM

## 2023-03-30 DIAGNOSIS — R001 Bradycardia, unspecified: Secondary | ICD-10-CM | POA: Diagnosis not present

## 2023-03-30 NOTE — Patient Instructions (Addendum)
Medication Instructions:  Your physician recommends that you continue on your current medications as directed. Please refer to the Current Medication list given to you today.  Labwork: None ordered.  Testing/Procedures: None ordered.  Follow-Up:  Your physician wants you to follow-up in: six months with an EP APP.  You will receive a reminder letter in the mail two months in advance. If you don't receive a letter, please call our office to schedule the follow-up appointment.    Implantable Loop Recorder Placement, Care After This sheet gives you information about how to care for yourself after your procedure. Your health care provider may also give you more specific instructions. If you have problems or questions, contact your health care provider. What can I expect after the procedure? After the procedure, it is common to have: Soreness or discomfort near the incision. Some swelling or bruising near the incision.  Follow these instructions at home: Incision care  Monitor your cardiac device site for redness, swelling, and drainage. Call the device clinic at [phone] if you experience these symptoms or fever/chills.  Keep the large square bandage on your site for 24 hours and then you may remove it yourself. Keep the steri-strips underneath in place.   You may shower after 72 hours / 3 days from your procedure with the steri-strips in place. They will usually fall off on their own, or may be removed after 10 days. Pat dry.   Avoid lotions, ointments, or perfumes over your incision until it is well-healed.  Please do not submerge in water until your site is completely healed.   Your device is MRI compatible.   Remote monitoring is used to monitor your cardiac device from home. This monitoring is scheduled every month by our office. It allows [place] to keep an eye on the function of your device to ensure it is working properly.  If your wound site starts to bleed apply pressure.     For help with the monitor please call [employer] Monitor Support Specialist directly at [phone].    If you have any questions/concerns please call the device clinic at [phone].  Activity  Return to your normal activities.  General instructions Follow instructions from your health care provider about how to manage your implantable loop recorder and transmit the information. Learn how to activate a recording if this is necessary for your type of device. You may go through a metal detection gate, and you may let someone hold a metal detector over your chest. Show your ID card if needed. Do not have an MRI unless you check with your health care provider first. Take over-the-counter and prescription medicines only as told by your health care provider. Keep all follow-up visits as told by your health care provider. This is important. Contact a health care provider if: You have redness, swelling, or pain around your incision. You have a fever. You have pain that is not relieved by your pain medicine. You have triggered your device because of fainting (syncope) or because of a heartbeat that feels like it is racing, slow, fluttering, or skipping (palpitations). Get help right away if you have: Chest pain. Difficulty breathing. Summary After the procedure, it is common to have soreness or discomfort near the incision. Change your dressing as told by your health care provider. Follow instructions from your health care provider about how to manage your implantable loop recorder and transmit the information. Keep all follow-up visits as told by your health care provider. This is important. This  information is not intended to replace advice given to you by your health care provider. Make sure you discuss any questions you have with your health care provider. Document Released: 07/23/2015 Document Revised: 09/26/2017 Document Reviewed: 09/26/2017 Elsevier Patient Education  2020 [employer].

## 2023-03-30 NOTE — Progress Notes (Signed)
Electrophysiology Office Follow up Visit Note:    Date:  03/30/2023   ID:  Luis Carter, DOB 09-25-1945, MRN [id number]  PCP:  Fleet Contras, MD  [facility] Cardiologist:  None  [facility] Electrophysiologist:  Luis Prude, MD    Interval History:    Luis Carter is a 77 y.o. male who presents for a follow up visit.   Last seen in June when he was hospitalized with sinus bradycardia.  The patient has a history of hypertension, hyperlipidemia, diabetes, COPD, diastolic heart failure, CKD 3B, gout, seizures, tobacco abuse and cocaine abuse.  He presented to the ER with several days of lightheadedness and dizziness.  EKG showed sinus bradycardia with a rate of 38 bpm.  There were no readily identifiable reversible causes of his bradycardia.  He was monitored at the hospital and had improvement in his heart rates.  He was ultimately discharged feeling well.  The plan was to follow-up as an outpatient for loop recorder implant for ongoing surveillance of his heart rhythms.  Today he is with family in clinic.  He is still interested in loop recorder monitoring. No presyncope or syncope since I last saw him.  He does tell me that there are days where he feels weak and tired.    Past medical, surgical, social and family history were reviewed.  ROS:   Please see the history of present illness.    All other systems reviewed and are negative.  EKGs/Labs/Other Studies Reviewed:    The following studies were reviewed today:    EKG Interpretation Date/Time:  Monday March 30 2023 13:56:22 EDT Ventricular Rate:  69 PR Interval:  164 QRS Duration:  142 QT Interval:  434 QTC Calculation: 465 R Axis:   67  Text Interpretation: Sinus rhythm with Premature atrial complexes Right bundle branch block Confirmed by Steffanie Dunn [phone]) on 03/30/2023 1:59:33 PM    Physical Exam:    VS:  BP (!) 152/84   Pulse 69   Ht 5\' 3"  (1.6 m)   Wt 179 lb 12.8 oz (81.6 kg)   SpO2  97%   BMI 31.85 kg/m     Wt Readings from Last 3 Encounters:  03/30/23 179 lb 12.8 oz (81.6 kg)  02/22/23 167 lb 11.2 oz (76.1 kg)  02/21/23 167 lb 11.2 oz (76.1 kg)     GEN:  Well nourished, well developed in no acute distress CARDIAC: RRR, no murmurs, rubs, gallops RESPIRATORY:  Clear to auscultation without rales, wheezing or rhonchi       ASSESSMENT:    1. Sinus bradycardia   2. Primary hypertension    PLAN:    In order of problems listed above:  #Sinus bradycardia Led to admission in June of this year with lightheadedness and dizziness.  No clear cause. Today he presents for loop recorder implant for rhythm surveillance given his history of presyncope.  I discussed the loop recorder implant process in detail including the risks and monthly monitoring costs and he wishes to proceed.  #Hypertension Above goal today.  Recommend checking blood pressures 1-2 times per week at home and recording the values.  Recommend bringing these recordings to the primary care physician. Cont hydralazine, lisinopril.   Signed, Steffanie Dunn, MD, [facility], [facility] 03/30/2023 1:59 PM    Electrophysiology [facility]  ---------------------  SURGEON:  Luis Prude, MD     PREPROCEDURE DIAGNOSIS:  Syncope    POSTPROCEDURE DIAGNOSIS: Syncope     PROCEDURES:  1. Implantable loop recorder implantation    INTRODUCTION:  Luis Carter presents with a history of syncope The costs of loop recorder monitoring have been discussed with the patient.    DESCRIPTION OF PROCEDURE:  Informed written consent was obtained.  A preprocedural timeout was performed with the RN Wenda Low). The patient required no sedation for the procedure today.  Mapping over the patient's chest was performed to identify the area where electrograms were most prominent for ILR recording.  This area was found to be the left parasternal region over the 4th intercostal space. The patients left  chest was therefore prepped and draped in the usual sterile fashion. The skin overlying the left parasternal region was infiltrated with lidocaine for local analgesia.  A 0.5-cm incision was made over the left parasternal region over the 3rd intercostal space.  A subcutaneous ILR pocket was fashioned using a combination of sharp and blunt dissection.  A Medtronic Reveal LINQ [id number] [phone] [id number]) implantable loop recorder was then placed into the pocket  R waves were very prominent and measured >0.50mV.  Steri- Strips and a sterile dressing were then applied.  There were no early apparent complications.     CONCLUSIONS:   1. Successful implantation of a implantable loop recorder for Syncope  2. No early apparent complications.   Sheria Lang T. Lalla Brothers, MD, [facility], [facility] Cardiac Electrophysiology

## 2023-04-02 DIAGNOSIS — E1122 Type 2 diabetes mellitus with diabetic chronic kidney disease: Secondary | ICD-10-CM | POA: Diagnosis not present

## 2023-04-02 DIAGNOSIS — E785 Hyperlipidemia, unspecified: Secondary | ICD-10-CM | POA: Diagnosis not present

## 2023-04-02 DIAGNOSIS — N1832 Chronic kidney disease, stage 3b: Secondary | ICD-10-CM | POA: Diagnosis not present

## 2023-04-02 DIAGNOSIS — Z7951 Long term (current) use of inhaled steroids: Secondary | ICD-10-CM | POA: Diagnosis not present

## 2023-04-02 DIAGNOSIS — G8929 Other chronic pain: Secondary | ICD-10-CM | POA: Diagnosis not present

## 2023-04-02 DIAGNOSIS — I5033 Acute on chronic diastolic (congestive) heart failure: Secondary | ICD-10-CM | POA: Diagnosis not present

## 2023-04-02 DIAGNOSIS — I130 Hypertensive heart and chronic kidney disease with heart failure and stage 1 through stage 4 chronic kidney disease, or unspecified chronic kidney disease: Secondary | ICD-10-CM | POA: Diagnosis not present

## 2023-04-02 DIAGNOSIS — Z87440 Personal history of urinary (tract) infections: Secondary | ICD-10-CM | POA: Diagnosis not present

## 2023-04-02 DIAGNOSIS — E1169 Type 2 diabetes mellitus with other specified complication: Secondary | ICD-10-CM | POA: Diagnosis not present

## 2023-04-02 DIAGNOSIS — M5440 Lumbago with sciatica, unspecified side: Secondary | ICD-10-CM | POA: Diagnosis not present

## 2023-04-02 DIAGNOSIS — J441 Chronic obstructive pulmonary disease with (acute) exacerbation: Secondary | ICD-10-CM | POA: Diagnosis not present

## 2023-04-02 DIAGNOSIS — E1165 Type 2 diabetes mellitus with hyperglycemia: Secondary | ICD-10-CM | POA: Diagnosis not present

## 2023-04-21 DIAGNOSIS — I130 Hypertensive heart and chronic kidney disease with heart failure and stage 1 through stage 4 chronic kidney disease, or unspecified chronic kidney disease: Secondary | ICD-10-CM | POA: Diagnosis not present

## 2023-04-21 DIAGNOSIS — J441 Chronic obstructive pulmonary disease with (acute) exacerbation: Secondary | ICD-10-CM | POA: Diagnosis not present

## 2023-04-21 DIAGNOSIS — G8929 Other chronic pain: Secondary | ICD-10-CM | POA: Diagnosis not present

## 2023-04-21 DIAGNOSIS — Z87440 Personal history of urinary (tract) infections: Secondary | ICD-10-CM | POA: Diagnosis not present

## 2023-04-21 DIAGNOSIS — E1165 Type 2 diabetes mellitus with hyperglycemia: Secondary | ICD-10-CM | POA: Diagnosis not present

## 2023-04-21 DIAGNOSIS — Z7951 Long term (current) use of inhaled steroids: Secondary | ICD-10-CM | POA: Diagnosis not present

## 2023-04-21 DIAGNOSIS — N1832 Chronic kidney disease, stage 3b: Secondary | ICD-10-CM | POA: Diagnosis not present

## 2023-04-21 DIAGNOSIS — E1122 Type 2 diabetes mellitus with diabetic chronic kidney disease: Secondary | ICD-10-CM | POA: Diagnosis not present

## 2023-04-21 DIAGNOSIS — M5440 Lumbago with sciatica, unspecified side: Secondary | ICD-10-CM | POA: Diagnosis not present

## 2023-04-21 DIAGNOSIS — E785 Hyperlipidemia, unspecified: Secondary | ICD-10-CM | POA: Diagnosis not present

## 2023-04-21 DIAGNOSIS — E1169 Type 2 diabetes mellitus with other specified complication: Secondary | ICD-10-CM | POA: Diagnosis not present

## 2023-04-21 DIAGNOSIS — I5033 Acute on chronic diastolic (congestive) heart failure: Secondary | ICD-10-CM | POA: Diagnosis not present

## 2023-04-30 DIAGNOSIS — E1142 Type 2 diabetes mellitus with diabetic polyneuropathy: Secondary | ICD-10-CM | POA: Diagnosis not present

## 2023-04-30 DIAGNOSIS — I10 Essential (primary) hypertension: Secondary | ICD-10-CM | POA: Diagnosis not present

## 2023-04-30 DIAGNOSIS — J449 Chronic obstructive pulmonary disease, unspecified: Secondary | ICD-10-CM | POA: Diagnosis not present

## 2023-04-30 DIAGNOSIS — M179 Osteoarthritis of knee, unspecified: Secondary | ICD-10-CM | POA: Diagnosis not present

## 2023-05-11 ENCOUNTER — Ambulatory Visit (INDEPENDENT_AMBULATORY_CARE_PROVIDER_SITE_OTHER): Payer: 59

## 2023-05-11 DIAGNOSIS — I429 Cardiomyopathy, unspecified: Secondary | ICD-10-CM

## 2023-05-12 DIAGNOSIS — I130 Hypertensive heart and chronic kidney disease with heart failure and stage 1 through stage 4 chronic kidney disease, or unspecified chronic kidney disease: Secondary | ICD-10-CM | POA: Diagnosis not present

## 2023-05-12 DIAGNOSIS — G8929 Other chronic pain: Secondary | ICD-10-CM | POA: Diagnosis not present

## 2023-05-12 DIAGNOSIS — Z7951 Long term (current) use of inhaled steroids: Secondary | ICD-10-CM | POA: Diagnosis not present

## 2023-05-12 DIAGNOSIS — M5440 Lumbago with sciatica, unspecified side: Secondary | ICD-10-CM | POA: Diagnosis not present

## 2023-05-12 DIAGNOSIS — E1165 Type 2 diabetes mellitus with hyperglycemia: Secondary | ICD-10-CM | POA: Diagnosis not present

## 2023-05-12 DIAGNOSIS — E1122 Type 2 diabetes mellitus with diabetic chronic kidney disease: Secondary | ICD-10-CM | POA: Diagnosis not present

## 2023-05-12 DIAGNOSIS — Z87440 Personal history of urinary (tract) infections: Secondary | ICD-10-CM | POA: Diagnosis not present

## 2023-05-12 DIAGNOSIS — J441 Chronic obstructive pulmonary disease with (acute) exacerbation: Secondary | ICD-10-CM | POA: Diagnosis not present

## 2023-05-12 DIAGNOSIS — N1832 Chronic kidney disease, stage 3b: Secondary | ICD-10-CM | POA: Diagnosis not present

## 2023-05-12 DIAGNOSIS — E1169 Type 2 diabetes mellitus with other specified complication: Secondary | ICD-10-CM | POA: Diagnosis not present

## 2023-05-12 DIAGNOSIS — E785 Hyperlipidemia, unspecified: Secondary | ICD-10-CM | POA: Diagnosis not present

## 2023-05-12 DIAGNOSIS — I5033 Acute on chronic diastolic (congestive) heart failure: Secondary | ICD-10-CM | POA: Diagnosis not present

## 2023-05-13 DIAGNOSIS — E1122 Type 2 diabetes mellitus with diabetic chronic kidney disease: Secondary | ICD-10-CM | POA: Diagnosis not present

## 2023-05-13 DIAGNOSIS — E1169 Type 2 diabetes mellitus with other specified complication: Secondary | ICD-10-CM | POA: Diagnosis not present

## 2023-05-13 DIAGNOSIS — I5032 Chronic diastolic (congestive) heart failure: Secondary | ICD-10-CM | POA: Diagnosis not present

## 2023-05-13 DIAGNOSIS — J449 Chronic obstructive pulmonary disease, unspecified: Secondary | ICD-10-CM | POA: Diagnosis not present

## 2023-05-13 DIAGNOSIS — I130 Hypertensive heart and chronic kidney disease with heart failure and stage 1 through stage 4 chronic kidney disease, or unspecified chronic kidney disease: Secondary | ICD-10-CM | POA: Diagnosis not present

## 2023-05-13 DIAGNOSIS — G8929 Other chronic pain: Secondary | ICD-10-CM | POA: Diagnosis not present

## 2023-05-13 DIAGNOSIS — M5440 Lumbago with sciatica, unspecified side: Secondary | ICD-10-CM | POA: Diagnosis not present

## 2023-05-13 DIAGNOSIS — Z7951 Long term (current) use of inhaled steroids: Secondary | ICD-10-CM | POA: Diagnosis not present

## 2023-05-13 DIAGNOSIS — N1832 Chronic kidney disease, stage 3b: Secondary | ICD-10-CM | POA: Diagnosis not present

## 2023-05-13 DIAGNOSIS — Z87440 Personal history of urinary (tract) infections: Secondary | ICD-10-CM | POA: Diagnosis not present

## 2023-05-13 DIAGNOSIS — E1165 Type 2 diabetes mellitus with hyperglycemia: Secondary | ICD-10-CM | POA: Diagnosis not present

## 2023-05-13 DIAGNOSIS — E785 Hyperlipidemia, unspecified: Secondary | ICD-10-CM | POA: Diagnosis not present

## 2023-05-20 DIAGNOSIS — I5033 Acute on chronic diastolic (congestive) heart failure: Secondary | ICD-10-CM | POA: Diagnosis not present

## 2023-05-20 DIAGNOSIS — E1169 Type 2 diabetes mellitus with other specified complication: Secondary | ICD-10-CM | POA: Diagnosis not present

## 2023-05-20 DIAGNOSIS — I130 Hypertensive heart and chronic kidney disease with heart failure and stage 1 through stage 4 chronic kidney disease, or unspecified chronic kidney disease: Secondary | ICD-10-CM | POA: Diagnosis not present

## 2023-05-20 DIAGNOSIS — J441 Chronic obstructive pulmonary disease with (acute) exacerbation: Secondary | ICD-10-CM | POA: Diagnosis not present

## 2023-05-20 DIAGNOSIS — G8929 Other chronic pain: Secondary | ICD-10-CM | POA: Diagnosis not present

## 2023-05-20 DIAGNOSIS — M6281 Muscle weakness (generalized): Secondary | ICD-10-CM | POA: Diagnosis not present

## 2023-05-20 DIAGNOSIS — N1832 Chronic kidney disease, stage 3b: Secondary | ICD-10-CM | POA: Diagnosis not present

## 2023-05-20 DIAGNOSIS — E785 Hyperlipidemia, unspecified: Secondary | ICD-10-CM | POA: Diagnosis not present

## 2023-05-20 DIAGNOSIS — E1165 Type 2 diabetes mellitus with hyperglycemia: Secondary | ICD-10-CM | POA: Diagnosis not present

## 2023-05-20 DIAGNOSIS — E1122 Type 2 diabetes mellitus with diabetic chronic kidney disease: Secondary | ICD-10-CM | POA: Diagnosis not present

## 2023-05-20 DIAGNOSIS — J449 Chronic obstructive pulmonary disease, unspecified: Secondary | ICD-10-CM | POA: Diagnosis not present

## 2023-05-20 DIAGNOSIS — Z7951 Long term (current) use of inhaled steroids: Secondary | ICD-10-CM | POA: Diagnosis not present

## 2023-05-20 DIAGNOSIS — Z87440 Personal history of urinary (tract) infections: Secondary | ICD-10-CM | POA: Diagnosis not present

## 2023-05-20 DIAGNOSIS — M5440 Lumbago with sciatica, unspecified side: Secondary | ICD-10-CM | POA: Diagnosis not present

## 2023-05-21 DIAGNOSIS — E1169 Type 2 diabetes mellitus with other specified complication: Secondary | ICD-10-CM | POA: Diagnosis not present

## 2023-05-21 DIAGNOSIS — G8929 Other chronic pain: Secondary | ICD-10-CM | POA: Diagnosis not present

## 2023-05-21 DIAGNOSIS — M6281 Muscle weakness (generalized): Secondary | ICD-10-CM | POA: Diagnosis not present

## 2023-05-21 DIAGNOSIS — Z87440 Personal history of urinary (tract) infections: Secondary | ICD-10-CM | POA: Diagnosis not present

## 2023-05-21 DIAGNOSIS — M5440 Lumbago with sciatica, unspecified side: Secondary | ICD-10-CM | POA: Diagnosis not present

## 2023-05-21 DIAGNOSIS — J441 Chronic obstructive pulmonary disease with (acute) exacerbation: Secondary | ICD-10-CM | POA: Diagnosis not present

## 2023-05-21 DIAGNOSIS — Z7951 Long term (current) use of inhaled steroids: Secondary | ICD-10-CM | POA: Diagnosis not present

## 2023-05-21 DIAGNOSIS — N1832 Chronic kidney disease, stage 3b: Secondary | ICD-10-CM | POA: Diagnosis not present

## 2023-05-21 DIAGNOSIS — E1122 Type 2 diabetes mellitus with diabetic chronic kidney disease: Secondary | ICD-10-CM | POA: Diagnosis not present

## 2023-05-21 DIAGNOSIS — I130 Hypertensive heart and chronic kidney disease with heart failure and stage 1 through stage 4 chronic kidney disease, or unspecified chronic kidney disease: Secondary | ICD-10-CM | POA: Diagnosis not present

## 2023-05-21 DIAGNOSIS — E785 Hyperlipidemia, unspecified: Secondary | ICD-10-CM | POA: Diagnosis not present

## 2023-05-21 DIAGNOSIS — J449 Chronic obstructive pulmonary disease, unspecified: Secondary | ICD-10-CM | POA: Diagnosis not present

## 2023-05-21 DIAGNOSIS — E1165 Type 2 diabetes mellitus with hyperglycemia: Secondary | ICD-10-CM | POA: Diagnosis not present

## 2023-05-21 DIAGNOSIS — I5033 Acute on chronic diastolic (congestive) heart failure: Secondary | ICD-10-CM | POA: Diagnosis not present

## 2023-05-25 DIAGNOSIS — M5440 Lumbago with sciatica, unspecified side: Secondary | ICD-10-CM | POA: Diagnosis not present

## 2023-05-25 DIAGNOSIS — G8929 Other chronic pain: Secondary | ICD-10-CM | POA: Diagnosis not present

## 2023-05-25 DIAGNOSIS — N1832 Chronic kidney disease, stage 3b: Secondary | ICD-10-CM | POA: Diagnosis not present

## 2023-05-25 DIAGNOSIS — J449 Chronic obstructive pulmonary disease, unspecified: Secondary | ICD-10-CM | POA: Diagnosis not present

## 2023-05-25 DIAGNOSIS — Z7951 Long term (current) use of inhaled steroids: Secondary | ICD-10-CM | POA: Diagnosis not present

## 2023-05-25 DIAGNOSIS — I5033 Acute on chronic diastolic (congestive) heart failure: Secondary | ICD-10-CM | POA: Diagnosis not present

## 2023-05-25 DIAGNOSIS — E1165 Type 2 diabetes mellitus with hyperglycemia: Secondary | ICD-10-CM | POA: Diagnosis not present

## 2023-05-25 DIAGNOSIS — E1169 Type 2 diabetes mellitus with other specified complication: Secondary | ICD-10-CM | POA: Diagnosis not present

## 2023-05-25 DIAGNOSIS — E1122 Type 2 diabetes mellitus with diabetic chronic kidney disease: Secondary | ICD-10-CM | POA: Diagnosis not present

## 2023-05-25 DIAGNOSIS — I130 Hypertensive heart and chronic kidney disease with heart failure and stage 1 through stage 4 chronic kidney disease, or unspecified chronic kidney disease: Secondary | ICD-10-CM | POA: Diagnosis not present

## 2023-05-25 DIAGNOSIS — E785 Hyperlipidemia, unspecified: Secondary | ICD-10-CM | POA: Diagnosis not present

## 2023-05-25 DIAGNOSIS — Z87440 Personal history of urinary (tract) infections: Secondary | ICD-10-CM | POA: Diagnosis not present

## 2023-05-25 DIAGNOSIS — J441 Chronic obstructive pulmonary disease with (acute) exacerbation: Secondary | ICD-10-CM | POA: Diagnosis not present

## 2023-05-25 DIAGNOSIS — M6281 Muscle weakness (generalized): Secondary | ICD-10-CM | POA: Diagnosis not present

## 2023-05-25 NOTE — Progress Notes (Signed)
Carelink Summary Report / Loop Recorder 

## 2023-05-27 DIAGNOSIS — J449 Chronic obstructive pulmonary disease, unspecified: Secondary | ICD-10-CM | POA: Diagnosis not present

## 2023-05-27 DIAGNOSIS — E1165 Type 2 diabetes mellitus with hyperglycemia: Secondary | ICD-10-CM | POA: Diagnosis not present

## 2023-05-27 DIAGNOSIS — I130 Hypertensive heart and chronic kidney disease with heart failure and stage 1 through stage 4 chronic kidney disease, or unspecified chronic kidney disease: Secondary | ICD-10-CM | POA: Diagnosis not present

## 2023-05-27 DIAGNOSIS — E1122 Type 2 diabetes mellitus with diabetic chronic kidney disease: Secondary | ICD-10-CM | POA: Diagnosis not present

## 2023-05-27 DIAGNOSIS — E785 Hyperlipidemia, unspecified: Secondary | ICD-10-CM | POA: Diagnosis not present

## 2023-05-27 DIAGNOSIS — M5440 Lumbago with sciatica, unspecified side: Secondary | ICD-10-CM | POA: Diagnosis not present

## 2023-05-27 DIAGNOSIS — Z87440 Personal history of urinary (tract) infections: Secondary | ICD-10-CM | POA: Diagnosis not present

## 2023-05-27 DIAGNOSIS — N1832 Chronic kidney disease, stage 3b: Secondary | ICD-10-CM | POA: Diagnosis not present

## 2023-05-27 DIAGNOSIS — Z7951 Long term (current) use of inhaled steroids: Secondary | ICD-10-CM | POA: Diagnosis not present

## 2023-05-27 DIAGNOSIS — E1169 Type 2 diabetes mellitus with other specified complication: Secondary | ICD-10-CM | POA: Diagnosis not present

## 2023-05-27 DIAGNOSIS — G8929 Other chronic pain: Secondary | ICD-10-CM | POA: Diagnosis not present

## 2023-05-27 DIAGNOSIS — I5032 Chronic diastolic (congestive) heart failure: Secondary | ICD-10-CM | POA: Diagnosis not present

## 2023-06-05 DIAGNOSIS — J449 Chronic obstructive pulmonary disease, unspecified: Secondary | ICD-10-CM | POA: Diagnosis not present

## 2023-06-05 DIAGNOSIS — E1165 Type 2 diabetes mellitus with hyperglycemia: Secondary | ICD-10-CM | POA: Diagnosis not present

## 2023-06-05 DIAGNOSIS — E1122 Type 2 diabetes mellitus with diabetic chronic kidney disease: Secondary | ICD-10-CM | POA: Diagnosis not present

## 2023-06-05 DIAGNOSIS — I130 Hypertensive heart and chronic kidney disease with heart failure and stage 1 through stage 4 chronic kidney disease, or unspecified chronic kidney disease: Secondary | ICD-10-CM | POA: Diagnosis not present

## 2023-06-05 DIAGNOSIS — E1169 Type 2 diabetes mellitus with other specified complication: Secondary | ICD-10-CM | POA: Diagnosis not present

## 2023-06-05 DIAGNOSIS — Z7951 Long term (current) use of inhaled steroids: Secondary | ICD-10-CM | POA: Diagnosis not present

## 2023-06-05 DIAGNOSIS — Z87440 Personal history of urinary (tract) infections: Secondary | ICD-10-CM | POA: Diagnosis not present

## 2023-06-05 DIAGNOSIS — I5032 Chronic diastolic (congestive) heart failure: Secondary | ICD-10-CM | POA: Diagnosis not present

## 2023-06-05 DIAGNOSIS — N1832 Chronic kidney disease, stage 3b: Secondary | ICD-10-CM | POA: Diagnosis not present

## 2023-06-05 DIAGNOSIS — M5440 Lumbago with sciatica, unspecified side: Secondary | ICD-10-CM | POA: Diagnosis not present

## 2023-06-05 DIAGNOSIS — G8929 Other chronic pain: Secondary | ICD-10-CM | POA: Diagnosis not present

## 2023-06-05 DIAGNOSIS — E785 Hyperlipidemia, unspecified: Secondary | ICD-10-CM | POA: Diagnosis not present

## 2023-06-11 DIAGNOSIS — E1122 Type 2 diabetes mellitus with diabetic chronic kidney disease: Secondary | ICD-10-CM | POA: Diagnosis not present

## 2023-06-11 DIAGNOSIS — N1832 Chronic kidney disease, stage 3b: Secondary | ICD-10-CM | POA: Diagnosis not present

## 2023-06-11 DIAGNOSIS — G8929 Other chronic pain: Secondary | ICD-10-CM | POA: Diagnosis not present

## 2023-06-11 DIAGNOSIS — E1169 Type 2 diabetes mellitus with other specified complication: Secondary | ICD-10-CM | POA: Diagnosis not present

## 2023-06-11 DIAGNOSIS — J449 Chronic obstructive pulmonary disease, unspecified: Secondary | ICD-10-CM | POA: Diagnosis not present

## 2023-06-11 DIAGNOSIS — I5032 Chronic diastolic (congestive) heart failure: Secondary | ICD-10-CM | POA: Diagnosis not present

## 2023-06-11 DIAGNOSIS — E785 Hyperlipidemia, unspecified: Secondary | ICD-10-CM | POA: Diagnosis not present

## 2023-06-11 DIAGNOSIS — M5440 Lumbago with sciatica, unspecified side: Secondary | ICD-10-CM | POA: Diagnosis not present

## 2023-06-11 DIAGNOSIS — Z87440 Personal history of urinary (tract) infections: Secondary | ICD-10-CM | POA: Diagnosis not present

## 2023-06-11 DIAGNOSIS — E1165 Type 2 diabetes mellitus with hyperglycemia: Secondary | ICD-10-CM | POA: Diagnosis not present

## 2023-06-11 DIAGNOSIS — Z7951 Long term (current) use of inhaled steroids: Secondary | ICD-10-CM | POA: Diagnosis not present

## 2023-06-11 DIAGNOSIS — I130 Hypertensive heart and chronic kidney disease with heart failure and stage 1 through stage 4 chronic kidney disease, or unspecified chronic kidney disease: Secondary | ICD-10-CM | POA: Diagnosis not present

## 2023-06-17 DIAGNOSIS — G8929 Other chronic pain: Secondary | ICD-10-CM | POA: Diagnosis not present

## 2023-06-17 DIAGNOSIS — I130 Hypertensive heart and chronic kidney disease with heart failure and stage 1 through stage 4 chronic kidney disease, or unspecified chronic kidney disease: Secondary | ICD-10-CM | POA: Diagnosis not present

## 2023-06-17 DIAGNOSIS — E785 Hyperlipidemia, unspecified: Secondary | ICD-10-CM | POA: Diagnosis not present

## 2023-06-17 DIAGNOSIS — E1122 Type 2 diabetes mellitus with diabetic chronic kidney disease: Secondary | ICD-10-CM | POA: Diagnosis not present

## 2023-06-17 DIAGNOSIS — N1832 Chronic kidney disease, stage 3b: Secondary | ICD-10-CM | POA: Diagnosis not present

## 2023-06-17 DIAGNOSIS — M5440 Lumbago with sciatica, unspecified side: Secondary | ICD-10-CM | POA: Diagnosis not present

## 2023-06-17 DIAGNOSIS — E1165 Type 2 diabetes mellitus with hyperglycemia: Secondary | ICD-10-CM | POA: Diagnosis not present

## 2023-06-17 DIAGNOSIS — E1169 Type 2 diabetes mellitus with other specified complication: Secondary | ICD-10-CM | POA: Diagnosis not present

## 2023-06-17 DIAGNOSIS — I5032 Chronic diastolic (congestive) heart failure: Secondary | ICD-10-CM | POA: Diagnosis not present

## 2023-06-17 DIAGNOSIS — Z7951 Long term (current) use of inhaled steroids: Secondary | ICD-10-CM | POA: Diagnosis not present

## 2023-06-17 DIAGNOSIS — J449 Chronic obstructive pulmonary disease, unspecified: Secondary | ICD-10-CM | POA: Diagnosis not present

## 2023-06-17 DIAGNOSIS — Z87440 Personal history of urinary (tract) infections: Secondary | ICD-10-CM | POA: Diagnosis not present

## 2023-06-18 DIAGNOSIS — J449 Chronic obstructive pulmonary disease, unspecified: Secondary | ICD-10-CM | POA: Diagnosis not present

## 2023-06-18 DIAGNOSIS — Z23 Encounter for immunization: Secondary | ICD-10-CM | POA: Diagnosis not present

## 2023-06-18 DIAGNOSIS — I10 Essential (primary) hypertension: Secondary | ICD-10-CM | POA: Diagnosis not present

## 2023-06-18 DIAGNOSIS — E119 Type 2 diabetes mellitus without complications: Secondary | ICD-10-CM | POA: Diagnosis not present

## 2023-06-24 DIAGNOSIS — N1832 Chronic kidney disease, stage 3b: Secondary | ICD-10-CM | POA: Diagnosis not present

## 2023-06-24 DIAGNOSIS — E785 Hyperlipidemia, unspecified: Secondary | ICD-10-CM | POA: Diagnosis not present

## 2023-06-24 DIAGNOSIS — G8929 Other chronic pain: Secondary | ICD-10-CM | POA: Diagnosis not present

## 2023-06-24 DIAGNOSIS — I5032 Chronic diastolic (congestive) heart failure: Secondary | ICD-10-CM | POA: Diagnosis not present

## 2023-06-24 DIAGNOSIS — E1165 Type 2 diabetes mellitus with hyperglycemia: Secondary | ICD-10-CM | POA: Diagnosis not present

## 2023-06-24 DIAGNOSIS — M5440 Lumbago with sciatica, unspecified side: Secondary | ICD-10-CM | POA: Diagnosis not present

## 2023-06-24 DIAGNOSIS — E1169 Type 2 diabetes mellitus with other specified complication: Secondary | ICD-10-CM | POA: Diagnosis not present

## 2023-06-24 DIAGNOSIS — E1122 Type 2 diabetes mellitus with diabetic chronic kidney disease: Secondary | ICD-10-CM | POA: Diagnosis not present

## 2023-06-24 DIAGNOSIS — J449 Chronic obstructive pulmonary disease, unspecified: Secondary | ICD-10-CM | POA: Diagnosis not present

## 2023-06-24 DIAGNOSIS — Z7951 Long term (current) use of inhaled steroids: Secondary | ICD-10-CM | POA: Diagnosis not present

## 2023-06-24 DIAGNOSIS — Z87440 Personal history of urinary (tract) infections: Secondary | ICD-10-CM | POA: Diagnosis not present

## 2023-06-24 DIAGNOSIS — I130 Hypertensive heart and chronic kidney disease with heart failure and stage 1 through stage 4 chronic kidney disease, or unspecified chronic kidney disease: Secondary | ICD-10-CM | POA: Diagnosis not present

## 2023-06-30 DIAGNOSIS — E785 Hyperlipidemia, unspecified: Secondary | ICD-10-CM | POA: Diagnosis not present

## 2023-06-30 DIAGNOSIS — Z7951 Long term (current) use of inhaled steroids: Secondary | ICD-10-CM | POA: Diagnosis not present

## 2023-06-30 DIAGNOSIS — E1122 Type 2 diabetes mellitus with diabetic chronic kidney disease: Secondary | ICD-10-CM | POA: Diagnosis not present

## 2023-06-30 DIAGNOSIS — M5440 Lumbago with sciatica, unspecified side: Secondary | ICD-10-CM | POA: Diagnosis not present

## 2023-06-30 DIAGNOSIS — N1832 Chronic kidney disease, stage 3b: Secondary | ICD-10-CM | POA: Diagnosis not present

## 2023-06-30 DIAGNOSIS — I130 Hypertensive heart and chronic kidney disease with heart failure and stage 1 through stage 4 chronic kidney disease, or unspecified chronic kidney disease: Secondary | ICD-10-CM | POA: Diagnosis not present

## 2023-06-30 DIAGNOSIS — E1165 Type 2 diabetes mellitus with hyperglycemia: Secondary | ICD-10-CM | POA: Diagnosis not present

## 2023-06-30 DIAGNOSIS — G8929 Other chronic pain: Secondary | ICD-10-CM | POA: Diagnosis not present

## 2023-06-30 DIAGNOSIS — E1169 Type 2 diabetes mellitus with other specified complication: Secondary | ICD-10-CM | POA: Diagnosis not present

## 2023-06-30 DIAGNOSIS — I5032 Chronic diastolic (congestive) heart failure: Secondary | ICD-10-CM | POA: Diagnosis not present

## 2023-06-30 DIAGNOSIS — J449 Chronic obstructive pulmonary disease, unspecified: Secondary | ICD-10-CM | POA: Diagnosis not present

## 2023-06-30 DIAGNOSIS — Z87440 Personal history of urinary (tract) infections: Secondary | ICD-10-CM | POA: Diagnosis not present

## 2023-07-01 DIAGNOSIS — Z7951 Long term (current) use of inhaled steroids: Secondary | ICD-10-CM | POA: Diagnosis not present

## 2023-07-01 DIAGNOSIS — G8929 Other chronic pain: Secondary | ICD-10-CM | POA: Diagnosis not present

## 2023-07-01 DIAGNOSIS — I5032 Chronic diastolic (congestive) heart failure: Secondary | ICD-10-CM | POA: Diagnosis not present

## 2023-07-01 DIAGNOSIS — I130 Hypertensive heart and chronic kidney disease with heart failure and stage 1 through stage 4 chronic kidney disease, or unspecified chronic kidney disease: Secondary | ICD-10-CM | POA: Diagnosis not present

## 2023-07-01 DIAGNOSIS — M5440 Lumbago with sciatica, unspecified side: Secondary | ICD-10-CM | POA: Diagnosis not present

## 2023-07-01 DIAGNOSIS — E1122 Type 2 diabetes mellitus with diabetic chronic kidney disease: Secondary | ICD-10-CM | POA: Diagnosis not present

## 2023-07-01 DIAGNOSIS — Z87440 Personal history of urinary (tract) infections: Secondary | ICD-10-CM | POA: Diagnosis not present

## 2023-07-01 DIAGNOSIS — J449 Chronic obstructive pulmonary disease, unspecified: Secondary | ICD-10-CM | POA: Diagnosis not present

## 2023-07-01 DIAGNOSIS — N1832 Chronic kidney disease, stage 3b: Secondary | ICD-10-CM | POA: Diagnosis not present

## 2023-07-01 DIAGNOSIS — E1169 Type 2 diabetes mellitus with other specified complication: Secondary | ICD-10-CM | POA: Diagnosis not present

## 2023-07-01 DIAGNOSIS — E785 Hyperlipidemia, unspecified: Secondary | ICD-10-CM | POA: Diagnosis not present

## 2023-07-01 DIAGNOSIS — E1165 Type 2 diabetes mellitus with hyperglycemia: Secondary | ICD-10-CM | POA: Diagnosis not present

## 2023-07-02 DIAGNOSIS — J449 Chronic obstructive pulmonary disease, unspecified: Secondary | ICD-10-CM | POA: Diagnosis not present

## 2023-07-06 DIAGNOSIS — Z7951 Long term (current) use of inhaled steroids: Secondary | ICD-10-CM | POA: Diagnosis not present

## 2023-07-06 DIAGNOSIS — G8929 Other chronic pain: Secondary | ICD-10-CM | POA: Diagnosis not present

## 2023-07-06 DIAGNOSIS — J449 Chronic obstructive pulmonary disease, unspecified: Secondary | ICD-10-CM | POA: Diagnosis not present

## 2023-07-06 DIAGNOSIS — N1832 Chronic kidney disease, stage 3b: Secondary | ICD-10-CM | POA: Diagnosis not present

## 2023-07-06 DIAGNOSIS — E1165 Type 2 diabetes mellitus with hyperglycemia: Secondary | ICD-10-CM | POA: Diagnosis not present

## 2023-07-06 DIAGNOSIS — Z87440 Personal history of urinary (tract) infections: Secondary | ICD-10-CM | POA: Diagnosis not present

## 2023-07-06 DIAGNOSIS — M5440 Lumbago with sciatica, unspecified side: Secondary | ICD-10-CM | POA: Diagnosis not present

## 2023-07-06 DIAGNOSIS — E1169 Type 2 diabetes mellitus with other specified complication: Secondary | ICD-10-CM | POA: Diagnosis not present

## 2023-07-06 DIAGNOSIS — E1122 Type 2 diabetes mellitus with diabetic chronic kidney disease: Secondary | ICD-10-CM | POA: Diagnosis not present

## 2023-07-06 DIAGNOSIS — I5032 Chronic diastolic (congestive) heart failure: Secondary | ICD-10-CM | POA: Diagnosis not present

## 2023-07-06 DIAGNOSIS — E785 Hyperlipidemia, unspecified: Secondary | ICD-10-CM | POA: Diagnosis not present

## 2023-07-06 DIAGNOSIS — I130 Hypertensive heart and chronic kidney disease with heart failure and stage 1 through stage 4 chronic kidney disease, or unspecified chronic kidney disease: Secondary | ICD-10-CM | POA: Diagnosis not present

## 2023-07-20 ENCOUNTER — Ambulatory Visit (INDEPENDENT_AMBULATORY_CARE_PROVIDER_SITE_OTHER): Payer: 59

## 2023-07-20 DIAGNOSIS — R55 Syncope and collapse: Secondary | ICD-10-CM | POA: Diagnosis not present

## 2023-07-20 LAB — CUP PACEART REMOTE DEVICE CHECK
Date Time Interrogation Session: 20241122230839
Implantable Pulse Generator Implant Date: 20240805

## 2023-07-27 IMAGING — DX DG HIP (WITH OR WITHOUT PELVIS) 2-3V*R*
4 series · 4 of 4 positions shown · non-contrast
Comparison: None.

CLINICAL DATA: Right hip pain for 2 days

EXAM:
DG HIP (WITH OR WITHOUT PELVIS) 2-3V RIGHT

[t hip frog leg right]
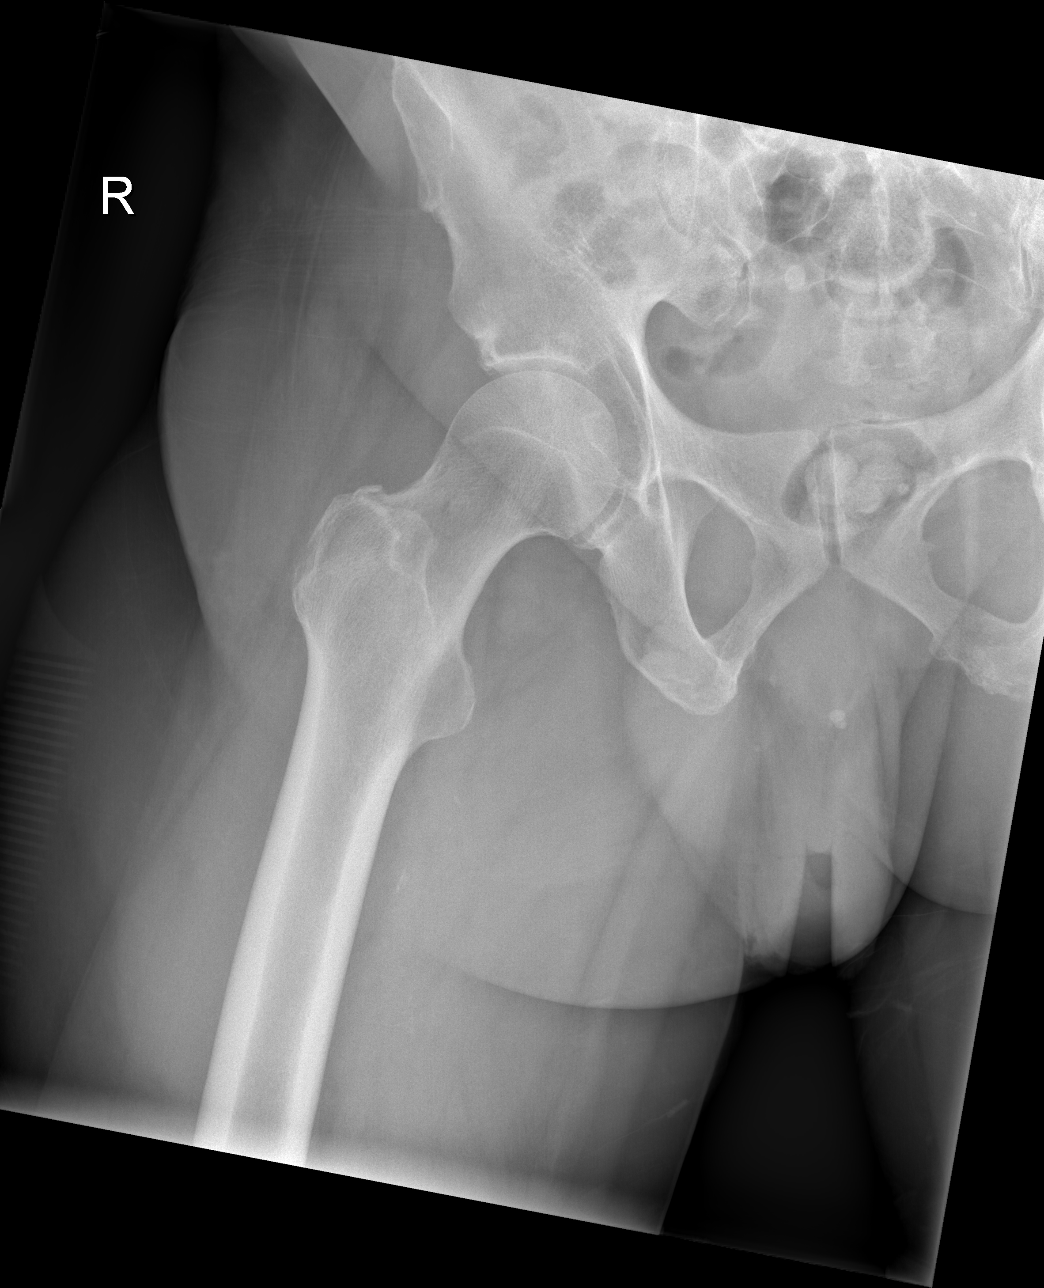

[t hip ap right]
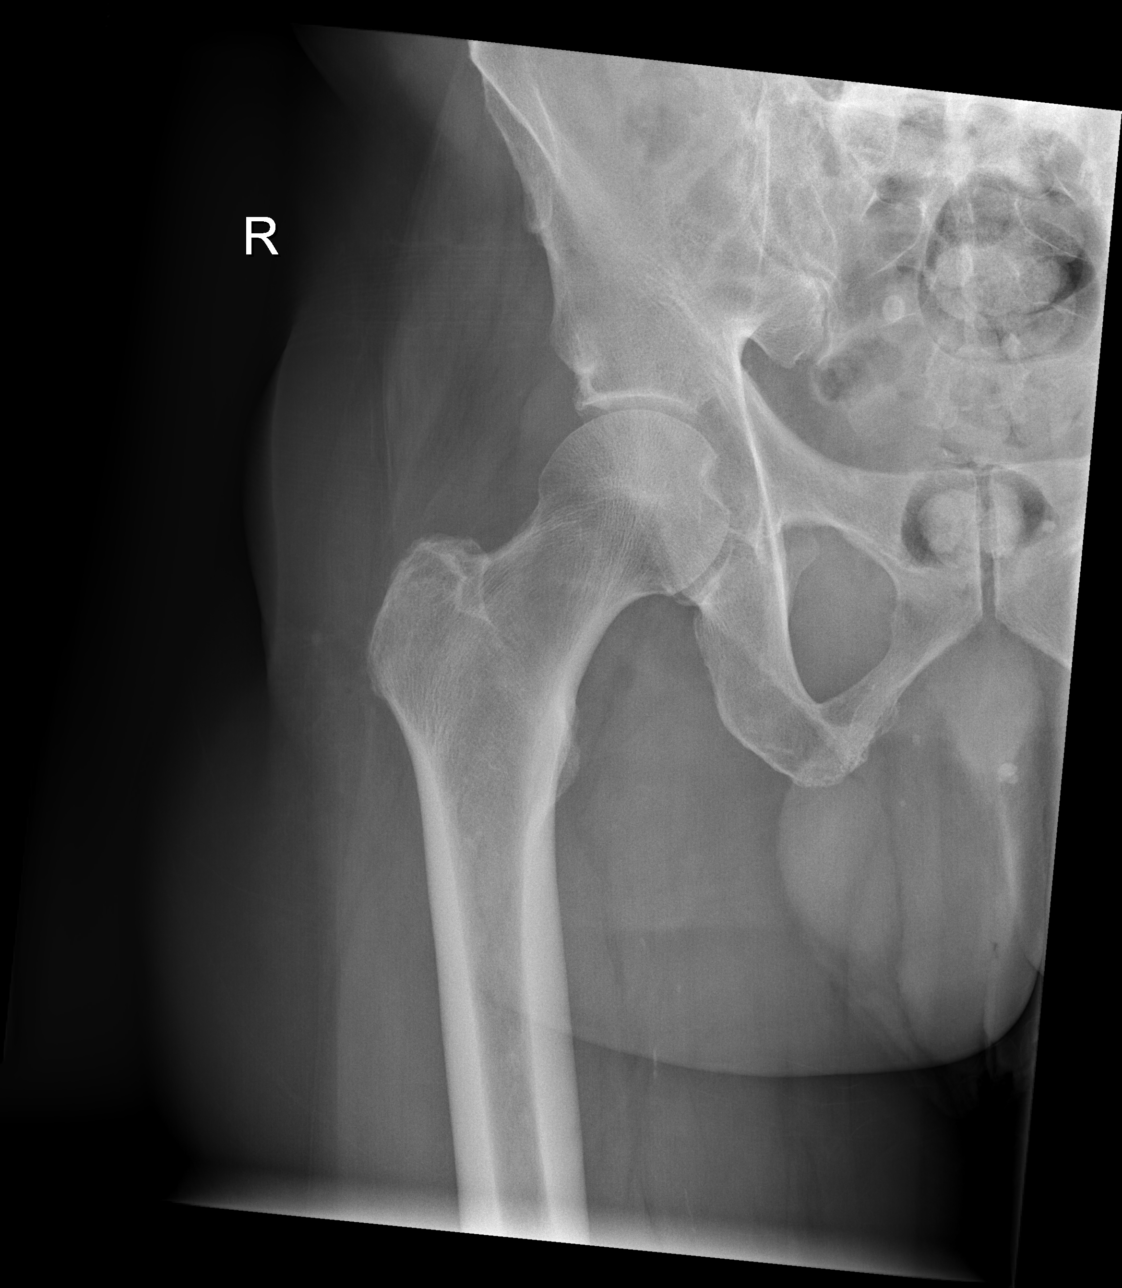

[t pelvis ap (1 of 2)]
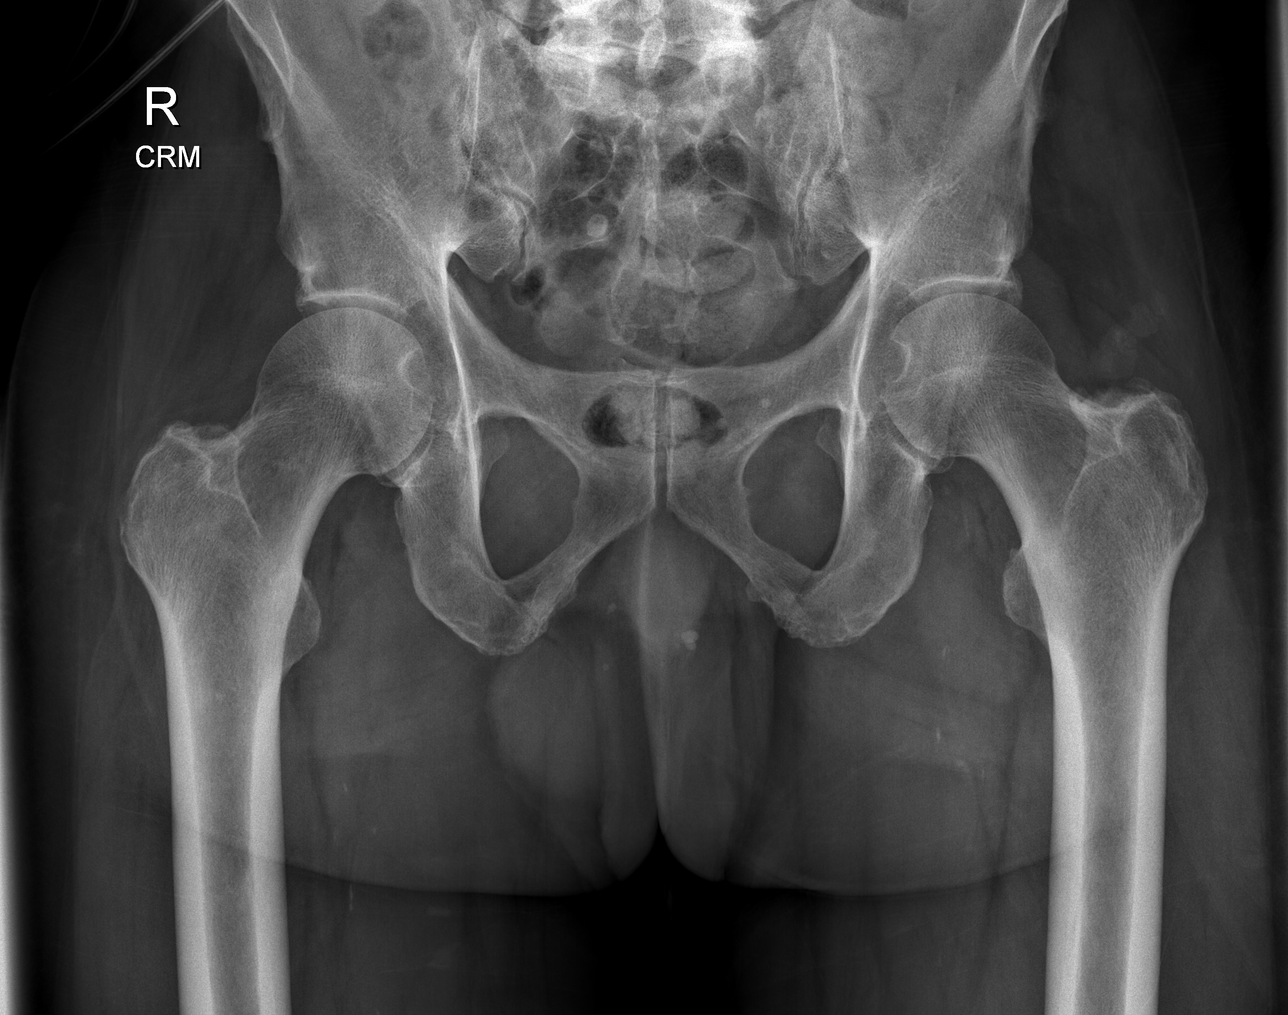

[t pelvis ap (2 of 2)]
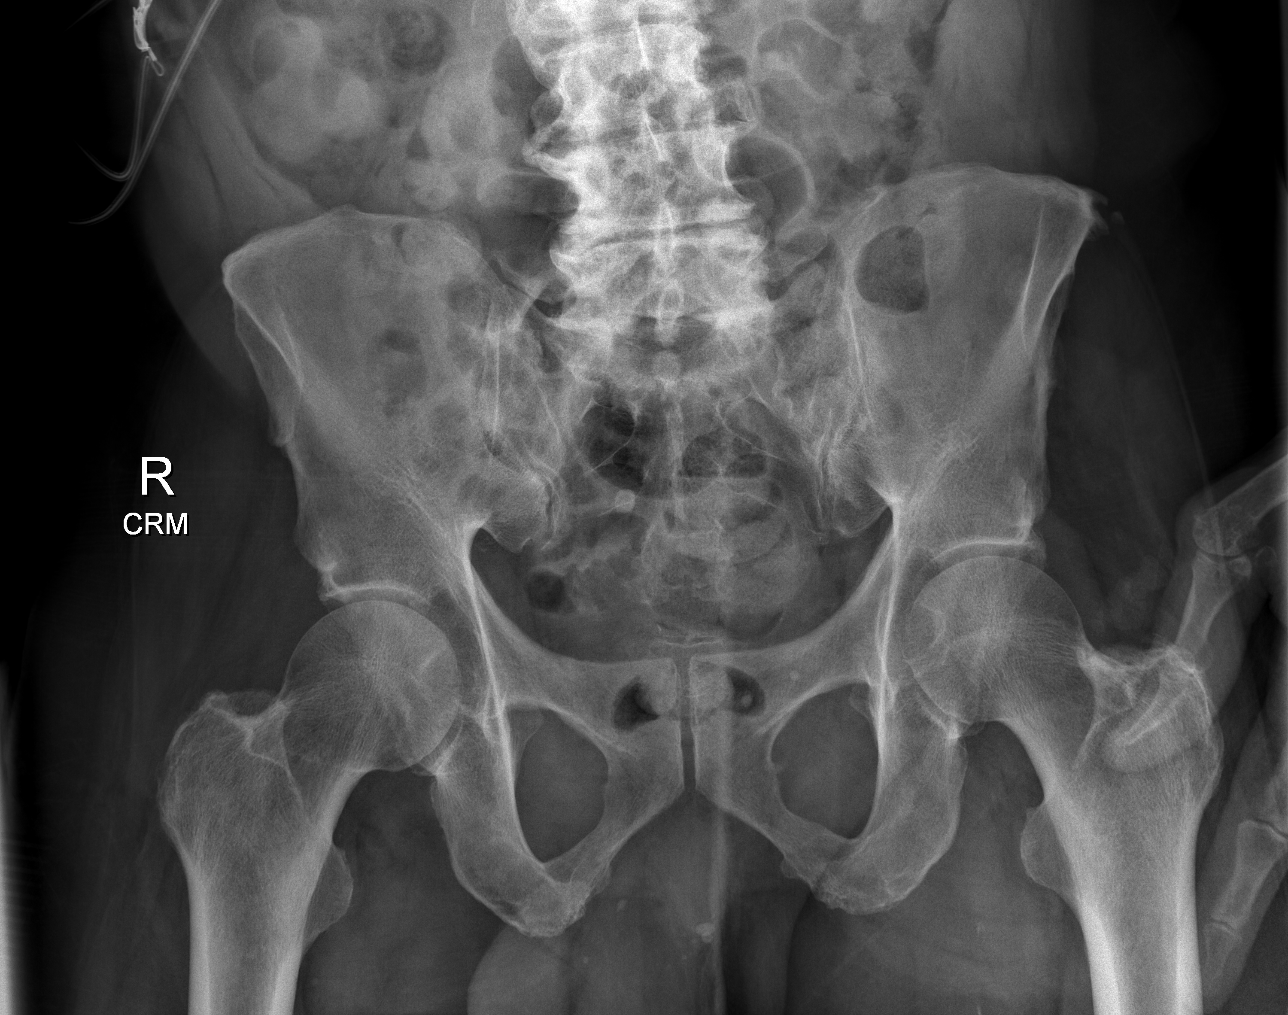

[4 of 4 positions shown; findings below may reference images not displayed]

FINDINGS: There is no evidence of hip fracture or dislocation. Mild joint
space narrowing of the right hip. No other focal bone abnormality.
Degenerative disc disease of the visualized lower lumbar spine.
Scattered vascular calcifications.
IMPRESSION: Mild degenerative changes of the right hip.  No acute findings.

## 2023-07-29 IMAGING — DX DG HIP (WITH OR WITHOUT PELVIS) 2-3V*L*
3 series · 3 of 3 positions shown · non-contrast
Comparison: Contralateral hip on September 16, 2021

CLINICAL DATA: A 75-year-old male presents with acute hip pain, no
history of reported injury.

EXAM:
DG HIP (WITH OR WITHOUT PELVIS) 2-3V LEFT

[t pelvis ap]
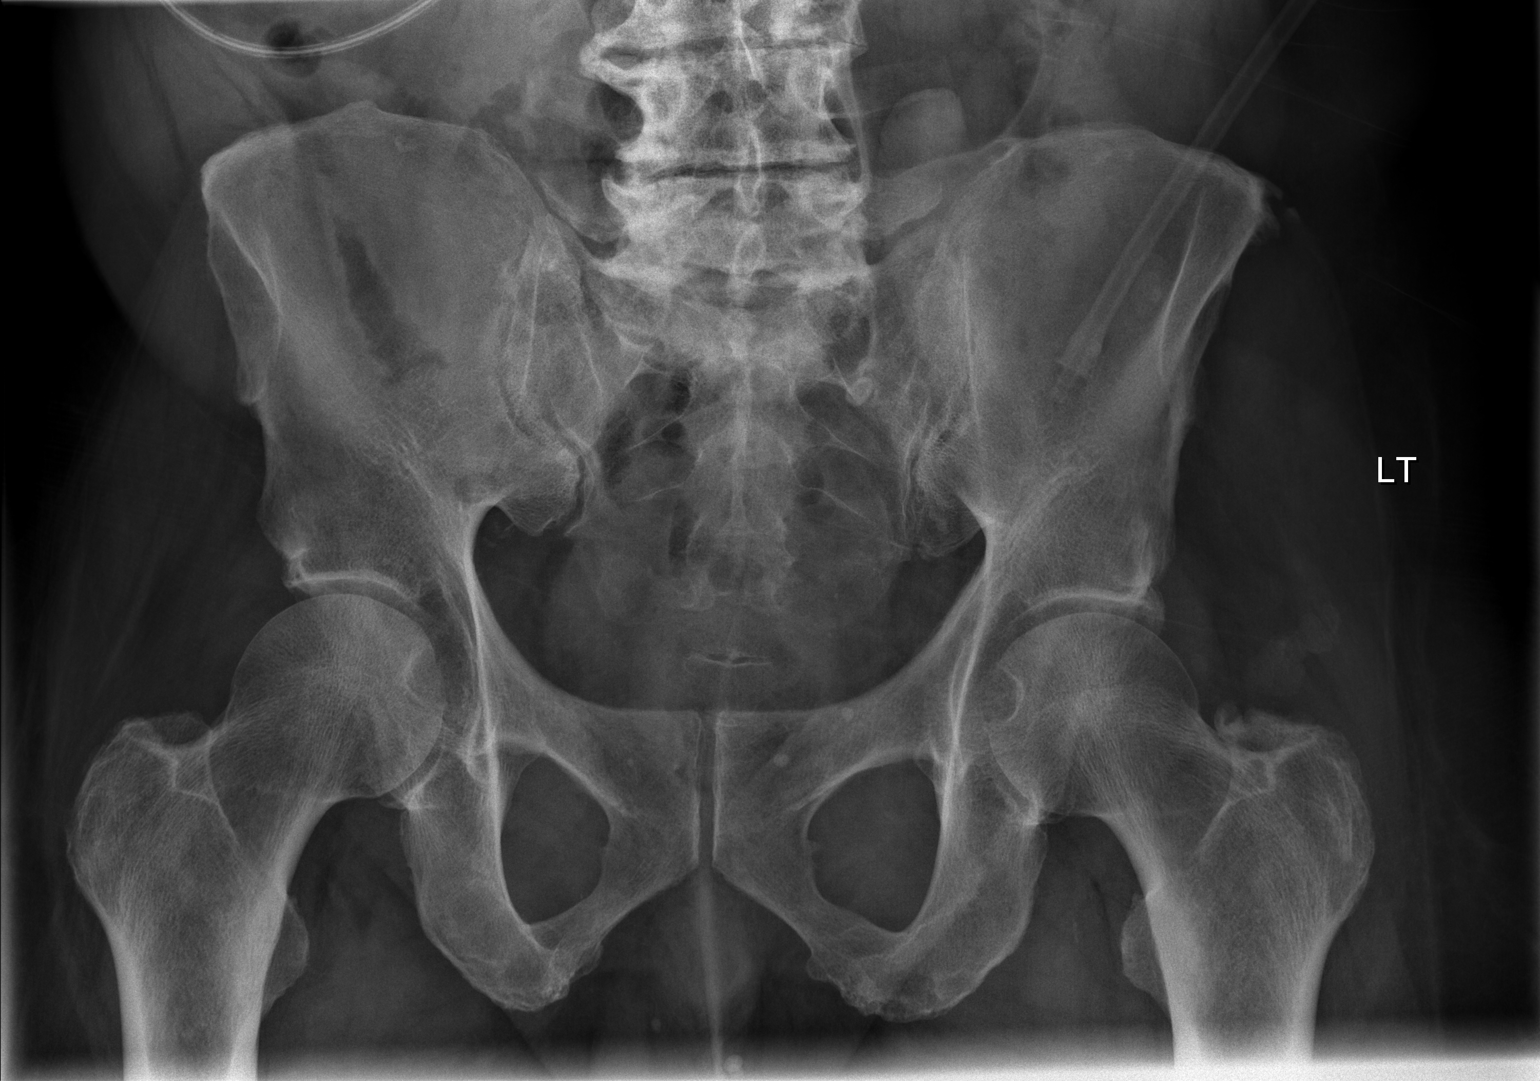

[t hip ap left]
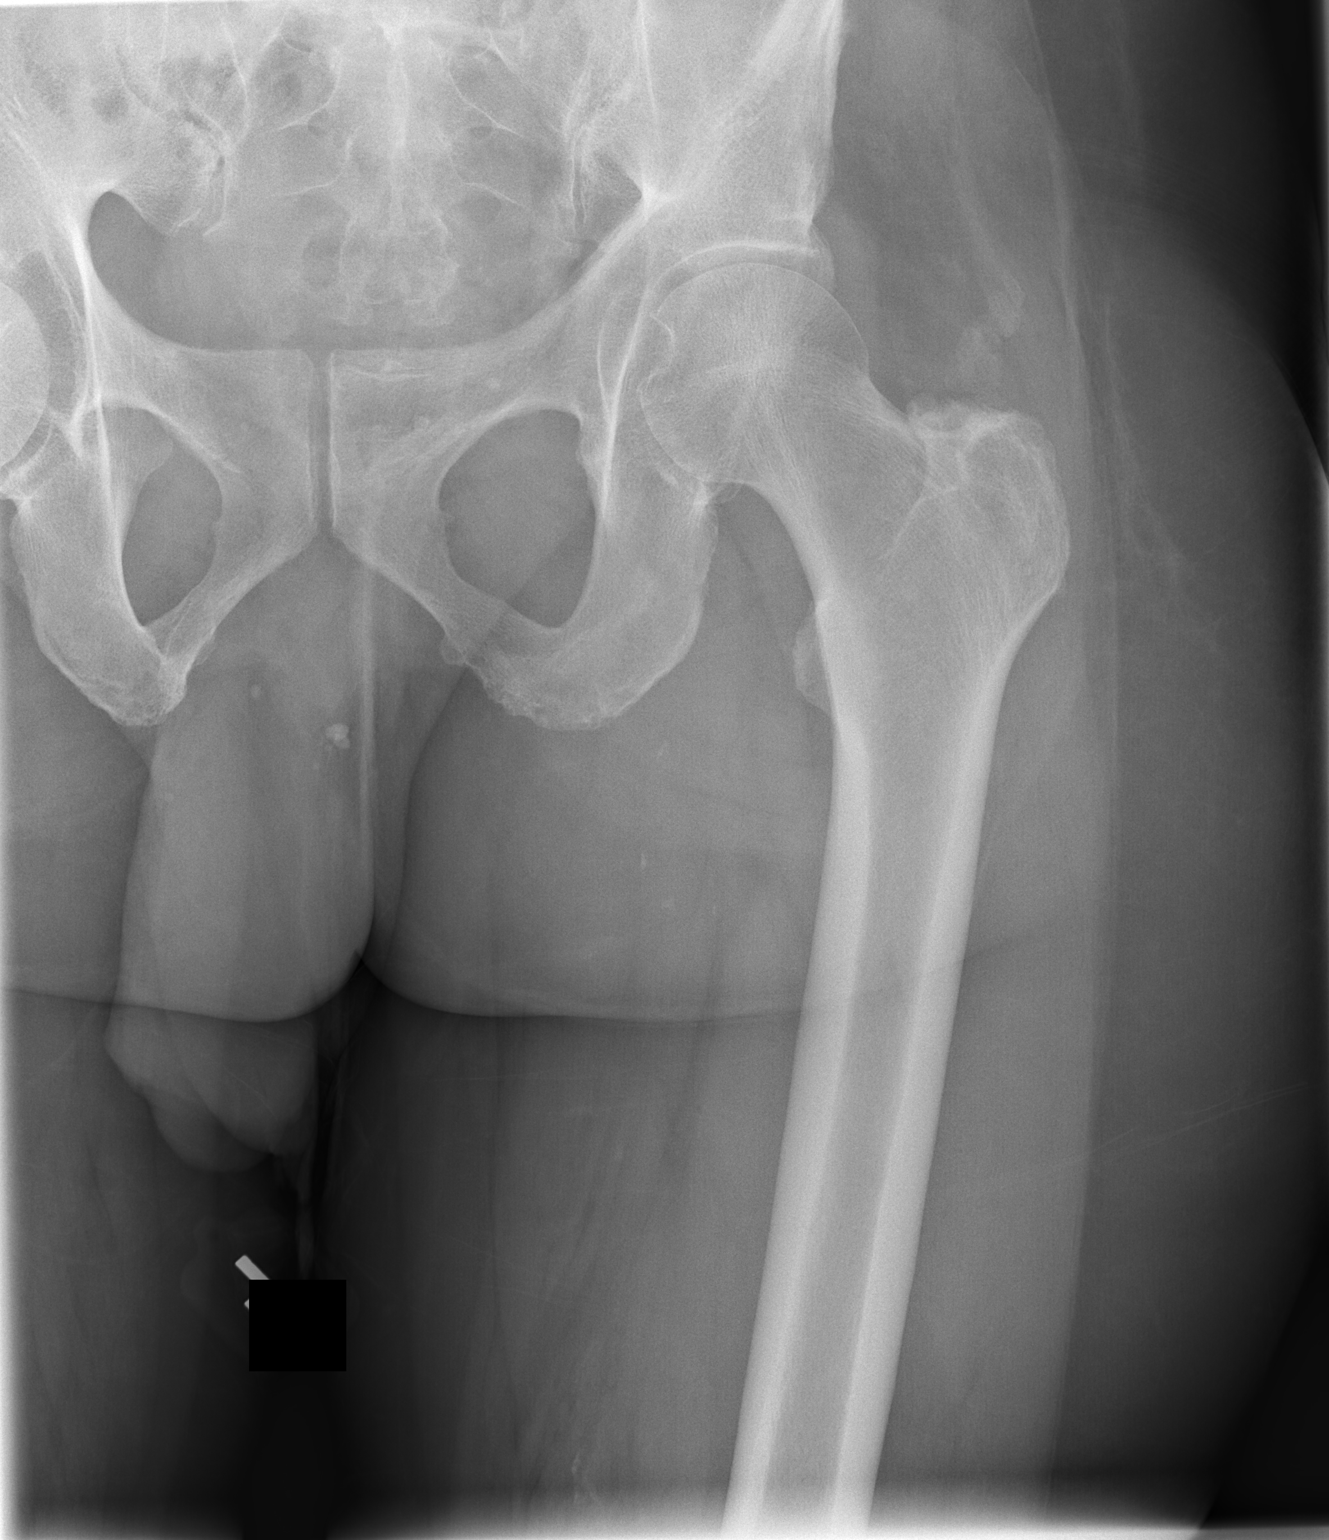

[t hip frog leg left]
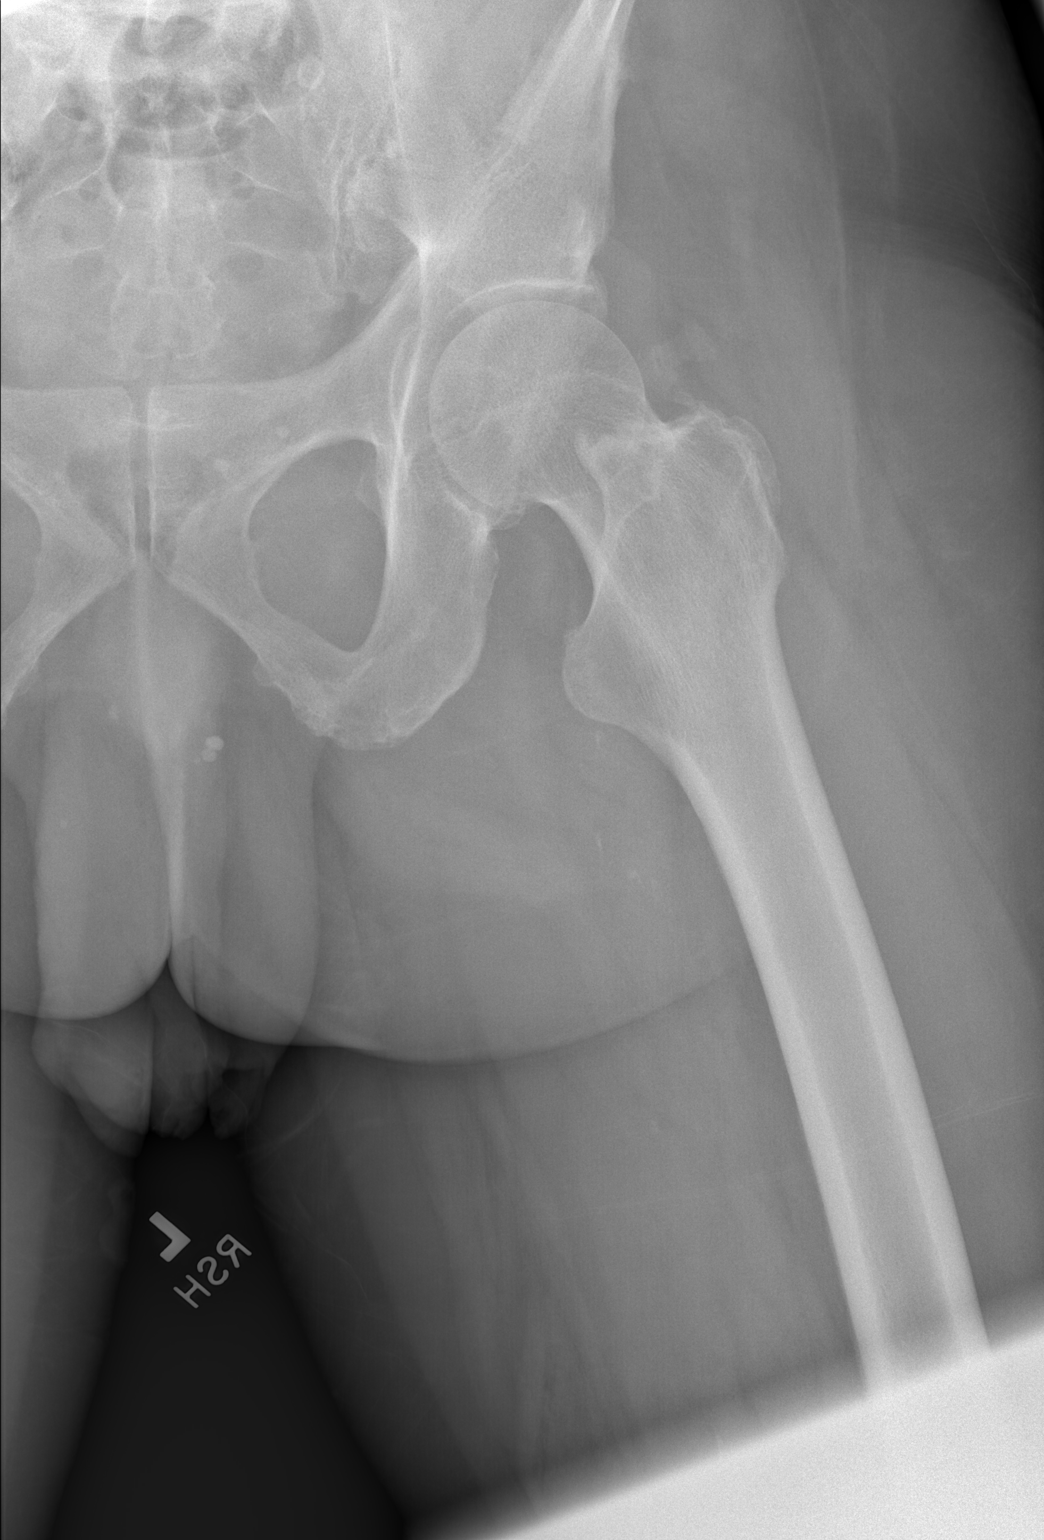

[3 of 3 positions shown; findings below may reference images not displayed]

FINDINGS: No signs of fracture or dislocation. Mild degenerative changes about
the LEFT and RIGHT hip. Degenerative changes in the lumbar spine.
Soft tissues are grossly normal.
IMPRESSION: Mild degenerative changes without acute osseous abnormality.

## 2023-08-14 NOTE — Progress Notes (Signed)
Carelink Summary Report / Loop Recorder 

## 2023-08-24 ENCOUNTER — Ambulatory Visit: Payer: 59

## 2023-08-24 DIAGNOSIS — R001 Bradycardia, unspecified: Secondary | ICD-10-CM

## 2023-08-25 LAB — CUP PACEART REMOTE DEVICE CHECK
Date Time Interrogation Session: 20241227231208
Implantable Pulse Generator Implant Date: 20240805

## 2023-09-17 DIAGNOSIS — I10 Essential (primary) hypertension: Secondary | ICD-10-CM | POA: Diagnosis not present

## 2023-09-17 DIAGNOSIS — J449 Chronic obstructive pulmonary disease, unspecified: Secondary | ICD-10-CM | POA: Diagnosis not present

## 2023-09-17 DIAGNOSIS — Z0000 Encounter for general adult medical examination without abnormal findings: Secondary | ICD-10-CM | POA: Diagnosis not present

## 2023-09-17 DIAGNOSIS — E7849 Other hyperlipidemia: Secondary | ICD-10-CM | POA: Diagnosis not present

## 2023-09-17 DIAGNOSIS — M1A9XX0 Chronic gout, unspecified, without tophus (tophi): Secondary | ICD-10-CM | POA: Diagnosis not present

## 2023-09-17 DIAGNOSIS — M179 Osteoarthritis of knee, unspecified: Secondary | ICD-10-CM | POA: Diagnosis not present

## 2023-09-17 DIAGNOSIS — E1142 Type 2 diabetes mellitus with diabetic polyneuropathy: Secondary | ICD-10-CM | POA: Diagnosis not present

## 2023-09-28 ENCOUNTER — Ambulatory Visit (INDEPENDENT_AMBULATORY_CARE_PROVIDER_SITE_OTHER): Payer: 59

## 2023-09-28 DIAGNOSIS — R55 Syncope and collapse: Secondary | ICD-10-CM

## 2023-09-28 LAB — CUP PACEART REMOTE DEVICE CHECK
Date Time Interrogation Session: 20250131230756
Implantable Pulse Generator Implant Date: 20240805

## 2023-10-14 ENCOUNTER — Ambulatory Visit: Payer: 59 | Admitting: Cardiology

## 2023-11-02 ENCOUNTER — Ambulatory Visit: Payer: 59

## 2023-11-02 DIAGNOSIS — I429 Cardiomyopathy, unspecified: Secondary | ICD-10-CM

## 2023-11-02 LAB — CUP PACEART REMOTE DEVICE CHECK
Date Time Interrogation Session: 20250309232017
Implantable Pulse Generator Implant Date: 20240805

## 2023-11-04 NOTE — Progress Notes (Signed)
 Carelink Summary Report / Loop Recorder

## 2023-11-08 ENCOUNTER — Inpatient Hospital Stay (HOSPITAL_COMMUNITY)
Admission: EM | Admit: 2023-11-08 | Discharge: 2023-11-14 | DRG: 865 | Disposition: A | Discharge status: Skilled Nursing Facility | Source: Skilled Nursing Facility | Attending: Internal Medicine | Admitting: Internal Medicine

## 2023-11-08 ENCOUNTER — Encounter (HOSPITAL_COMMUNITY): Payer: Self-pay | Admitting: *Deleted

## 2023-11-08 ENCOUNTER — Other Ambulatory Visit: Payer: Self-pay

## 2023-11-08 ENCOUNTER — Emergency Department (HOSPITAL_COMMUNITY)

## 2023-11-08 DIAGNOSIS — Z1152 Encounter for screening for COVID-19: Secondary | ICD-10-CM

## 2023-11-08 DIAGNOSIS — E785 Hyperlipidemia, unspecified: Secondary | ICD-10-CM | POA: Diagnosis present

## 2023-11-08 DIAGNOSIS — M609 Myositis, unspecified: Secondary | ICD-10-CM

## 2023-11-08 DIAGNOSIS — M6282 Rhabdomyolysis: Secondary | ICD-10-CM | POA: Diagnosis present

## 2023-11-08 DIAGNOSIS — I2510 Atherosclerotic heart disease of native coronary artery without angina pectoris: Secondary | ICD-10-CM | POA: Diagnosis present

## 2023-11-08 DIAGNOSIS — E7800 Pure hypercholesterolemia, unspecified: Secondary | ICD-10-CM | POA: Diagnosis present

## 2023-11-08 DIAGNOSIS — N390 Urinary tract infection, site not specified: Secondary | ICD-10-CM | POA: Diagnosis present

## 2023-11-08 DIAGNOSIS — K219 Gastro-esophageal reflux disease without esophagitis: Secondary | ICD-10-CM | POA: Diagnosis present

## 2023-11-08 DIAGNOSIS — R000 Tachycardia, unspecified: Secondary | ICD-10-CM | POA: Diagnosis present

## 2023-11-08 DIAGNOSIS — Z8249 Family history of ischemic heart disease and other diseases of the circulatory system: Secondary | ICD-10-CM

## 2023-11-08 DIAGNOSIS — N329 Bladder disorder, unspecified: Secondary | ICD-10-CM | POA: Diagnosis present

## 2023-11-08 DIAGNOSIS — N1832 Chronic kidney disease, stage 3b: Secondary | ICD-10-CM | POA: Diagnosis present

## 2023-11-08 DIAGNOSIS — I252 Old myocardial infarction: Secondary | ICD-10-CM

## 2023-11-08 DIAGNOSIS — Z87891 Personal history of nicotine dependence: Secondary | ICD-10-CM

## 2023-11-08 DIAGNOSIS — B952 Enterococcus as the cause of diseases classified elsewhere: Secondary | ICD-10-CM | POA: Diagnosis present

## 2023-11-08 DIAGNOSIS — J1081 Influenza due to other identified influenza virus with encephalopathy: Secondary | ICD-10-CM | POA: Diagnosis not present

## 2023-11-08 DIAGNOSIS — G9341 Metabolic encephalopathy: Secondary | ICD-10-CM | POA: Diagnosis present

## 2023-11-08 DIAGNOSIS — Z7982 Long term (current) use of aspirin: Secondary | ICD-10-CM

## 2023-11-08 DIAGNOSIS — G40909 Epilepsy, unspecified, not intractable, without status epilepticus: Secondary | ICD-10-CM | POA: Diagnosis present

## 2023-11-08 DIAGNOSIS — I10 Essential (primary) hypertension: Secondary | ICD-10-CM | POA: Diagnosis present

## 2023-11-08 DIAGNOSIS — J4489 Other specified chronic obstructive pulmonary disease: Secondary | ICD-10-CM | POA: Diagnosis present

## 2023-11-08 DIAGNOSIS — J101 Influenza due to other identified influenza virus with other respiratory manifestations: Secondary | ICD-10-CM | POA: Diagnosis not present

## 2023-11-08 DIAGNOSIS — I130 Hypertensive heart and chronic kidney disease with heart failure and stage 1 through stage 4 chronic kidney disease, or unspecified chronic kidney disease: Secondary | ICD-10-CM | POA: Diagnosis present

## 2023-11-08 DIAGNOSIS — I5032 Chronic diastolic (congestive) heart failure: Secondary | ICD-10-CM | POA: Diagnosis present

## 2023-11-08 DIAGNOSIS — N179 Acute kidney failure, unspecified: Secondary | ICD-10-CM | POA: Diagnosis present

## 2023-11-08 DIAGNOSIS — R531 Weakness: Secondary | ICD-10-CM | POA: Diagnosis present

## 2023-11-08 DIAGNOSIS — Z955 Presence of coronary angioplasty implant and graft: Secondary | ICD-10-CM

## 2023-11-08 DIAGNOSIS — Z7951 Long term (current) use of inhaled steroids: Secondary | ICD-10-CM

## 2023-11-08 DIAGNOSIS — Z794 Long term (current) use of insulin: Secondary | ICD-10-CM

## 2023-11-08 DIAGNOSIS — Z6831 Body mass index (BMI) 31.0-31.9, adult: Secondary | ICD-10-CM

## 2023-11-08 DIAGNOSIS — E1122 Type 2 diabetes mellitus with diabetic chronic kidney disease: Secondary | ICD-10-CM | POA: Diagnosis present

## 2023-11-08 DIAGNOSIS — Z7984 Long term (current) use of oral hypoglycemic drugs: Secondary | ICD-10-CM

## 2023-11-08 DIAGNOSIS — M109 Gout, unspecified: Secondary | ICD-10-CM | POA: Diagnosis present

## 2023-11-08 DIAGNOSIS — E669 Obesity, unspecified: Secondary | ICD-10-CM | POA: Diagnosis present

## 2023-11-08 LAB — CBC WITH DIFFERENTIAL/PLATELET
Abs Immature Granulocytes: 0.02 10*3/uL (ref 0.00–0.07)
Basophils Absolute: 0 10*3/uL (ref 0.0–0.1)
Basophils Relative: 1 %
Eosinophils Absolute: 0 10*3/uL (ref 0.0–0.5)
Eosinophils Relative: 0 %
HCT: 42.1 % (ref 39.0–52.0)
Hemoglobin: 13.3 g/dL (ref 13.0–17.0)
Immature Granulocytes: 0 %
Lymphocytes Relative: 26 %
Lymphs Abs: 1.6 10*3/uL (ref 0.7–4.0)
MCH: 27 pg (ref 26.0–34.0)
MCHC: 31.6 g/dL (ref 30.0–36.0)
MCV: 85.4 fL (ref 80.0–100.0)
Monocytes Absolute: 0.7 10*3/uL (ref 0.1–1.0)
Monocytes Relative: 11 %
Neutro Abs: 3.9 10*3/uL (ref 1.7–7.7)
Neutrophils Relative %: 62 %
Platelets: 204 10*3/uL (ref 150–400)
RBC: 4.93 MIL/uL (ref 4.22–5.81)
RDW: 15.1 % (ref 11.5–15.5)
WBC: 6.2 10*3/uL (ref 4.0–10.5)
nRBC: 0 % (ref 0.0–0.2)

## 2023-11-08 LAB — COMPREHENSIVE METABOLIC PANEL
ALT: 29 U/L (ref 0–44)
AST: 104 U/L — ABNORMAL HIGH (ref 15–41)
Albumin: 4 g/dL (ref 3.5–5.0)
Alkaline Phosphatase: 86 U/L (ref 38–126)
Anion gap: 11 (ref 5–15)
BUN: 34 mg/dL — ABNORMAL HIGH (ref 8–23)
CO2: 22 mmol/L (ref 22–32)
Calcium: 9.1 mg/dL (ref 8.9–10.3)
Chloride: 104 mmol/L (ref 98–111)
Creatinine, Ser: 2.01 mg/dL — ABNORMAL HIGH (ref 0.61–1.24)
GFR, Estimated: 33 mL/min — ABNORMAL LOW (ref >60)
Glucose, Bld: 99 mg/dL (ref 70–99)
Potassium: 4.7 mmol/L (ref 3.5–5.1)
Sodium: 137 mmol/L (ref 135–145)
Total Bilirubin: 0.9 mg/dL (ref 0.0–1.2)
Total Protein: 8.3 g/dL — ABNORMAL HIGH (ref 6.5–8.1)

## 2023-11-08 LAB — PROTIME-INR
INR: 1 (ref 0.8–1.2)
Prothrombin Time: 13.9 s (ref 11.4–15.2)

## 2023-11-08 LAB — TROPONIN I (HIGH SENSITIVITY): Troponin I (High Sensitivity): 71 ng/L — ABNORMAL HIGH (ref <18)

## 2023-11-08 LAB — I-STAT CG4 LACTIC ACID, ED: Lactic Acid, Venous: 1.2 mmol/L (ref 0.5–1.9)

## 2023-11-08 LAB — RESP PANEL BY RT-PCR (RSV, FLU A&B, COVID)  RVPGX2
Influenza A by PCR: POSITIVE — AB
Influenza B by PCR: NEGATIVE
Resp Syncytial Virus by PCR: NEGATIVE
SARS Coronavirus 2 by RT PCR: NEGATIVE

## 2023-11-08 LAB — LIPASE, BLOOD: Lipase: 53 U/L — ABNORMAL HIGH (ref 11–51)

## 2023-11-08 LAB — CK: Total CK: 3480 U/L — ABNORMAL HIGH (ref 49–397)

## 2023-11-08 MED ORDER — IOHEXOL 350 MG/ML SOLN
75.0000 mL | Freq: Once | INTRAVENOUS | Status: AC | PRN
  Administered 2023-11-08: 60 mL via INTRAVENOUS

## 2023-11-08 MED ORDER — SODIUM CHLORIDE 0.9 % IV BOLUS
500.0000 mL | Freq: Once | INTRAVENOUS | Status: AC
  Administered 2023-11-09: 500 mL via INTRAVENOUS

## 2023-11-08 MED ORDER — ASPIRIN 81 MG PO CHEW
324.0000 mg | CHEWABLE_TABLET | Freq: Once | ORAL | Status: AC
  Administered 2023-11-09: 324 mg via ORAL
  Filled 2023-11-08: qty 4

## 2023-11-08 NOTE — ED Notes (Signed)
 Unsuccessful straight cath attempt by this RN; condom cath placed

## 2023-11-08 NOTE — ED Provider Notes (Signed)
 [place] EMERGENCY DEPARTMENT AT [facility] Provider Note   CSN: [id number] Arrival date & time: 11/08/23  1919     History  Chief Complaint  Patient presents with   Chest Pain    Luis Carter is a 78 y.o. male.  HPI 77 year old male presents with altered mental status and not feeling well.  History is primarily from the brother-in-law.  For the last 3 or 4 days he has been saying "oh God oh God" and acting in pain.  Points to his right leg but also his abdomen.  Does not appear to have any leg swelling according to family.  He has been having a cough for about a week.  No reports of fevers.  He does have some memory difficulties and is not normal at baseline but it is hard to tell talking to the brother-in-law what his typical mental status is except that this is different.  Patient has not had an obvious fever or any chest pain.  Home Medications Prior to Admission medications   Medication Sig Start Date End Date Taking? Authorizing Provider  albuterol (PROAIR HFA) 108 (90 BASE) MCG/ACT inhaler INHALE 2 PUFFS INTO THE LUNGS EVERY 6 (SIX) HOURS AS NEEDED FOR WHEEZING. Patient taking differently: Inhale 2 puffs into the lungs every 6 (six) hours as needed for shortness of breath. 03/05/15   Gordy Savers, MD  allopurinol (ZYLOPRIM) 100 MG tablet Take 2 tablets (200 mg total) by mouth daily. 03/05/15   Gordy Savers, MD  aspirin ([employer] ASPIRIN LOW DOSE) 81 MG EC tablet Take 1 tablet (81 mg total) by mouth daily. Swallow whole. 03/16/21   Leroy Sea, MD  atorvastatin (LIPITOR) 20 MG tablet TAKE 1 TABLET BY MOUTH EVERY DAY Patient taking differently: Take 20 mg by mouth at bedtime. 06/02/16   Gordy Savers, MD  diclofenac Sodium (VOLTAREN) 1 % GEL Apply 4 g topically 4 (four) times daily as needed (pain). 01/01/23   [provider]  fluticasone (FLONASE) 50 MCG/ACT nasal spray Place 2 sprays into both nostrils 2 (two) times daily. 02/13/21    Arrien, York Ram, MD  fluticasone-salmeterol (ADVAIR) 250-50 MCG/ACT AEPB Inhale 1 puff into the lungs in the morning and at bedtime. 02/13/21   Arrien, York Ram, MD  gabapentin (NEURONTIN) 300 MG capsule Take 1 capsule (300 mg total) by mouth 2 (two) times daily. 01/09/23 02/08/23  Arrien, York Ram, MD  hydrALAZINE (APRESOLINE) 25 MG tablet Take 1 tablet (25 mg total) by mouth every 8 (eight) hours. 02/21/23   Burnadette Pop, MD  ipratropium-albuterol (DUONEB) 0.5-2.5 (3) MG/3ML SOLN Take 3 mLs by nebulization every 6 (six) hours as needed. Patient taking differently: Take 3 mLs by nebulization every 6 (six) hours as needed (shortness of breath). 02/13/21   Arrien, York Ram, MD  lisinopril (ZESTRIL) 20 MG tablet Take 1 tablet (20 mg total) by mouth daily. 02/22/23   Burnadette Pop, MD  metFORMIN (GLUCOPHAGE) 500 MG tablet Take 1 tablet (500 mg total) by mouth 2 (two) times daily. 03/20/22 04/19/22  Dorcas Carrow, MD  montelukast (SINGULAIR) 10 MG tablet Take 10 mg by mouth daily. 12/10/22   [provider]  omeprazole (PRILOSEC) 20 MG capsule Take 1 capsule (20 mg total) by mouth daily. 03/05/15   Gordy Savers, MD  tiZANidine (ZANAFLEX) 4 MG tablet Take 4 mg by mouth 2 (two) times daily as needed for muscle spasms. 10/16/22   [provider]  Vibegron (GEMTESA) 75 MG  TABS Take 75 mg by mouth daily.    [provider]  Vitamin D, Ergocalciferol, (DRISDOL) 1.25 MG (50000 UNIT) CAPS capsule Take 50,000 Units by mouth every Monday.    [provider]      Allergies    Patient has no known allergies.    Review of Systems   Review of Systems  Unable to perform ROS: Mental status change    Physical Exam Updated Vital Signs BP 120/87 (BP Location: Left Arm)   Pulse (!) 101   Temp 98.7 F (37.1 C) (Oral)   Resp 20   Ht 5\' 3"  (1.6 m)   Wt 81.6 kg   SpO2 100%   BMI 31.87 kg/m  Physical Exam Vitals and nursing note reviewed.   Constitutional:      Appearance: He is well-developed. He is not diaphoretic.  HENT:     Head: Normocephalic and atraumatic.  Cardiovascular:     Rate and Rhythm: Normal rate and regular rhythm.     Pulses:          Dorsalis pedis pulses are 2+ on the right side and 2+ on the left side.     Heart sounds: Normal heart sounds.  Pulmonary:     Effort: Pulmonary effort is normal.     Breath sounds: Normal breath sounds.  Abdominal:     Palpations: Abdomen is soft.     Tenderness: There is no abdominal tenderness.  Musculoskeletal:     Comments: No tenderness to the right thigh or lower leg.  Normal range of motion of the hip and knee.  No swelling compared to the left side.  Skin:    General: Skin is warm and dry.  Neurological:     Mental Status: He is alert. He is disoriented.     Comments: Knows he is in the hospital but disoriented today and does not know the year.  He moves all 4 extremities equally.     ED Results / Procedures / Treatments   Labs (all labs ordered are listed, but only abnormal results are displayed) Labs Reviewed  COMPREHENSIVE METABOLIC PANEL - Abnormal; Notable for the following components:      Result Value   BUN 34 (*)    Creatinine, Ser 2.01 (*)    Total Protein 8.3 (*)    AST 104 (*)    GFR, Estimated 33 (*)    All other components within normal limits  CK - Abnormal; Notable for the following components:   Total CK 3,480 (*)    All other components within normal limits  LIPASE, BLOOD - Abnormal; Notable for the following components:   Lipase 53 (*)    All other components within normal limits  TROPONIN I (HIGH SENSITIVITY) - Abnormal; Notable for the following components:   Troponin I (High Sensitivity) 71 (*)    All other components within normal limits  CULTURE, BLOOD (ROUTINE X 2)  CULTURE, BLOOD (ROUTINE X 2)  RESP PANEL BY RT-PCR (RSV, FLU A&B, COVID)  RVPGX2  CBC WITH DIFFERENTIAL/PLATELET  PROTIME-INR  URINALYSIS, W/ REFLEX TO  CULTURE (INFECTION SUSPECTED)  I-STAT CG4 LACTIC ACID, ED  TROPONIN I (HIGH SENSITIVITY)    EKG EKG Interpretation Date/Time:  Sunday November 08 2023 21:42:32 EDT Ventricular Rate:  84 PR Interval:  175 QRS Duration:  100 QT Interval:  378 QTC Calculation: 447 R Axis:   85  Text Interpretation: Sinus rhythm Right atrial enlargement Borderline right axis deviation Minimal ST depression, inferior leads  Confirmed by Pricilla Loveless [phone]) on 11/08/2023 9:48:24 PM  Radiology DG Chest 2 View Result Date: 11/08/2023 CLINICAL DATA:  Suspected sepsis EXAM: CHEST - 2 VIEW COMPARISON:  CT and radiograph 02/22/2023 FINDINGS: Stable cardiomediastinal silhouette. Loop recorder. Scarring in the left mid lung laterally. No pleural effusion or pneumothorax. Shrapnel overlying the left upper chest/axilla. IMPRESSION: No active cardiopulmonary disease. Electronically Signed   By: Minerva Fester M.D.   On: 11/08/2023 21:10    Procedures Procedures    Medications Ordered in ED Medications  sodium chloride 0.9 % bolus 500 mL (has no administration in time range)  aspirin chewable tablet 324 mg (has no administration in time range)    ED Course/ Medical Decision Making/ A&P                                 Medical Decision Making Amount and/or Complexity of Data Reviewed Labs: ordered. Radiology: ordered.  Risk OTC drugs.   Limited history as the patient likely has some memory issues at baseline but now is also confused.  He does have abdominal tenderness so CT is currently pending.  He has a mild AKI as well as a CK that is elevated at 3400.  His leg was not tender currently though he has been complaining of his right leg hurting.  He has pulses in both feet.  No obvious swelling to his leg. Care transferred to Dr. Blinda Leatherwood.        Final Clinical Impression(s) / ED Diagnoses Final diagnoses:  None    Rx / DC Orders ED Discharge Orders     None         Pricilla Loveless,  MD 11/08/23 2321

## 2023-11-08 NOTE — ED Notes (Signed)
 Patients brother in [place] - > [phone]

## 2023-11-08 NOTE — ED Triage Notes (Signed)
 Confused since yesterday the brother brought him in    unable to determine where his pain is he just mumbles

## 2023-11-08 NOTE — ED Provider Triage Note (Signed)
 Emergency Medicine Provider Triage Evaluation Note  Luis Carter , a 78 y.o. male  was evaluated in triage.  Pt complains of here with brother complaining of altered mental status.  Patient apparently has been saying "oh God oh God" for the last 2 days.  The patient smells of urine.  He is unable to fully participate in examination.  He follows commands.  Review of Systems  Positive:  Negative:   Physical Exam  BP 120/87 (BP Location: Left Arm)   Pulse (!) 101   Temp 98.7 F (37.1 C) (Oral)   Resp 20   Ht 5\' 3"  (1.6 m)   Wt 81.6 kg   SpO2 100%   BMI 31.87 kg/m  Gen:   Awake, no distress   Resp:  Normal effort  MSK:   Moves extremities without difficulty  Other:    Medical Decision Making  Medically screening exam initiated at 8:34 PM.  Appropriate orders placed.  Luis Carter was informed that the remainder of the evaluation will be completed by another provider, this initial triage assessment does not replace that evaluation, and the importance of remaining in the ED until their evaluation is complete.  Roomed at this time   Luis Carter 11/08/23 2035

## 2023-11-09 ENCOUNTER — Inpatient Hospital Stay (HOSPITAL_COMMUNITY)

## 2023-11-09 DIAGNOSIS — R7989 Other specified abnormal findings of blood chemistry: Secondary | ICD-10-CM

## 2023-11-09 DIAGNOSIS — J1081 Influenza due to other identified influenza virus with encephalopathy: Secondary | ICD-10-CM | POA: Diagnosis present

## 2023-11-09 DIAGNOSIS — Z8249 Family history of ischemic heart disease and other diseases of the circulatory system: Secondary | ICD-10-CM | POA: Diagnosis not present

## 2023-11-09 DIAGNOSIS — E1122 Type 2 diabetes mellitus with diabetic chronic kidney disease: Secondary | ICD-10-CM | POA: Diagnosis present

## 2023-11-09 DIAGNOSIS — Z7951 Long term (current) use of inhaled steroids: Secondary | ICD-10-CM | POA: Diagnosis not present

## 2023-11-09 DIAGNOSIS — I5032 Chronic diastolic (congestive) heart failure: Secondary | ICD-10-CM | POA: Diagnosis present

## 2023-11-09 DIAGNOSIS — N1832 Chronic kidney disease, stage 3b: Secondary | ICD-10-CM | POA: Diagnosis present

## 2023-11-09 DIAGNOSIS — I2510 Atherosclerotic heart disease of native coronary artery without angina pectoris: Secondary | ICD-10-CM | POA: Diagnosis present

## 2023-11-09 DIAGNOSIS — J101 Influenza due to other identified influenza virus with other respiratory manifestations: Secondary | ICD-10-CM | POA: Diagnosis present

## 2023-11-09 DIAGNOSIS — Z955 Presence of coronary angioplasty implant and graft: Secondary | ICD-10-CM | POA: Diagnosis not present

## 2023-11-09 DIAGNOSIS — E669 Obesity, unspecified: Secondary | ICD-10-CM | POA: Diagnosis present

## 2023-11-09 DIAGNOSIS — N179 Acute kidney failure, unspecified: Secondary | ICD-10-CM | POA: Diagnosis present

## 2023-11-09 DIAGNOSIS — I130 Hypertensive heart and chronic kidney disease with heart failure and stage 1 through stage 4 chronic kidney disease, or unspecified chronic kidney disease: Secondary | ICD-10-CM | POA: Diagnosis present

## 2023-11-09 DIAGNOSIS — M109 Gout, unspecified: Secondary | ICD-10-CM | POA: Diagnosis present

## 2023-11-09 DIAGNOSIS — N390 Urinary tract infection, site not specified: Secondary | ICD-10-CM | POA: Diagnosis present

## 2023-11-09 DIAGNOSIS — E7800 Pure hypercholesterolemia, unspecified: Secondary | ICD-10-CM | POA: Diagnosis present

## 2023-11-09 DIAGNOSIS — G9341 Metabolic encephalopathy: Secondary | ICD-10-CM | POA: Diagnosis present

## 2023-11-09 DIAGNOSIS — I1310 Hypertensive heart and chronic kidney disease without heart failure, with stage 1 through stage 4 chronic kidney disease, or unspecified chronic kidney disease: Secondary | ICD-10-CM | POA: Diagnosis not present

## 2023-11-09 DIAGNOSIS — J4489 Other specified chronic obstructive pulmonary disease: Secondary | ICD-10-CM | POA: Diagnosis present

## 2023-11-09 DIAGNOSIS — Z7982 Long term (current) use of aspirin: Secondary | ICD-10-CM | POA: Diagnosis not present

## 2023-11-09 DIAGNOSIS — M6282 Rhabdomyolysis: Secondary | ICD-10-CM | POA: Diagnosis present

## 2023-11-09 DIAGNOSIS — Z794 Long term (current) use of insulin: Secondary | ICD-10-CM | POA: Diagnosis not present

## 2023-11-09 DIAGNOSIS — Z1152 Encounter for screening for COVID-19: Secondary | ICD-10-CM | POA: Diagnosis not present

## 2023-11-09 DIAGNOSIS — G40909 Epilepsy, unspecified, not intractable, without status epilepticus: Secondary | ICD-10-CM | POA: Diagnosis present

## 2023-11-09 DIAGNOSIS — Z7984 Long term (current) use of oral hypoglycemic drugs: Secondary | ICD-10-CM | POA: Diagnosis not present

## 2023-11-09 DIAGNOSIS — Z87891 Personal history of nicotine dependence: Secondary | ICD-10-CM | POA: Diagnosis not present

## 2023-11-09 LAB — CBG MONITORING, ED
Glucose-Capillary: 59 mg/dL — ABNORMAL LOW (ref 70–99)
Glucose-Capillary: 77 mg/dL (ref 70–99)
Glucose-Capillary: 101 mg/dL — ABNORMAL HIGH (ref 70–99)
Glucose-Capillary: 78 mg/dL (ref 70–99)
Glucose-Capillary: 87 mg/dL (ref 70–99)

## 2023-11-09 LAB — COMPREHENSIVE METABOLIC PANEL
ALT: 26 U/L (ref 0–44)
AST: 92 U/L — ABNORMAL HIGH (ref 15–41)
Albumin: 3.4 g/dL — ABNORMAL LOW (ref 3.5–5.0)
Alkaline Phosphatase: 68 U/L (ref 38–126)
Anion gap: 15 (ref 5–15)
BUN: 35 mg/dL — ABNORMAL HIGH (ref 8–23)
CO2: 15 mmol/L — ABNORMAL LOW (ref 22–32)
Calcium: 8 mg/dL — ABNORMAL LOW (ref 8.9–10.3)
Chloride: 106 mmol/L (ref 98–111)
Creatinine, Ser: 2.02 mg/dL — ABNORMAL HIGH (ref 0.61–1.24)
GFR, Estimated: 33 mL/min — ABNORMAL LOW (ref >60)
Glucose, Bld: 81 mg/dL (ref 70–99)
Potassium: 4.4 mmol/L (ref 3.5–5.1)
Sodium: 136 mmol/L (ref 135–145)
Total Bilirubin: 0.8 mg/dL (ref 0.0–1.2)
Total Protein: 7.1 g/dL (ref 6.5–8.1)

## 2023-11-09 LAB — CBC
HCT: 39.2 % (ref 39.0–52.0)
Hemoglobin: 11.8 g/dL — ABNORMAL LOW (ref 13.0–17.0)
MCH: 27 pg (ref 26.0–34.0)
MCHC: 30.1 g/dL (ref 30.0–36.0)
MCV: 89.7 fL (ref 80.0–100.0)
Platelets: 187 10*3/uL (ref 150–400)
RBC: 4.37 MIL/uL (ref 4.22–5.81)
RDW: 15.1 % (ref 11.5–15.5)
WBC: 6 10*3/uL (ref 4.0–10.5)
nRBC: 0 % (ref 0.0–0.2)

## 2023-11-09 LAB — URINALYSIS, W/ REFLEX TO CULTURE (INFECTION SUSPECTED)
Bilirubin Urine: NEGATIVE
Glucose, UA: NEGATIVE mg/dL
Ketones, ur: 5 mg/dL — AB
Nitrite: NEGATIVE
Protein, ur: 100 mg/dL — AB
Specific Gravity, Urine: 1.024 (ref 1.005–1.030)
pH: 5 (ref 5.0–8.0)

## 2023-11-09 LAB — HEMOGLOBIN A1C
Hgb A1c MFr Bld: 6.5 % — ABNORMAL HIGH (ref 4.8–5.6)
Mean Plasma Glucose: 139.85 mg/dL

## 2023-11-09 LAB — ECHOCARDIOGRAM COMPLETE
AR max vel: 2.21 cm2
AV Area VTI: 2.31 cm2
AV Area mean vel: 2 cm2
AV Mean grad: 4 mmHg
AV Peak grad: 7.2 mmHg
Ao pk vel: 1.34 m/s
Area-P 1/2: 1.68 cm2
Calc EF: 66.9 %
Height: 63 in
MV VTI: 2.2 cm2
S' Lateral: 2.8 cm
Single Plane A2C EF: 71 %
Single Plane A4C EF: 61.7 %
Weight: 2878.33 [oz_av]

## 2023-11-09 LAB — TROPONIN I (HIGH SENSITIVITY): Troponin I (High Sensitivity): 62 ng/L — ABNORMAL HIGH (ref <18)

## 2023-11-09 LAB — TSH: TSH: 1.754 u[IU]/mL (ref 0.350–4.500)

## 2023-11-09 MED ORDER — ONDANSETRON HCL 4 MG/2ML IJ SOLN: 4.0000 mg | Freq: Four times a day (QID) | INTRAMUSCULAR | Status: DC | PRN

## 2023-11-09 MED ORDER — ATORVASTATIN CALCIUM 10 MG PO TABS
20.0000 mg | ORAL_TABLET | Freq: Every day | ORAL | Status: DC
  Administered 2023-11-09 – 2023-11-13 (×5): 20 mg via ORAL
  Filled 2023-11-09 (×5): qty 2

## 2023-11-09 MED ORDER — ALBUTEROL SULFATE (2.5 MG/3ML) 0.083% IN NEBU
2.5000 mg | INHALATION_SOLUTION | RESPIRATORY_TRACT | Status: DC | PRN
  Administered 2023-11-09 – 2023-11-13 (×4): 2.5 mg via RESPIRATORY_TRACT
  Filled 2023-11-09 (×4): qty 3

## 2023-11-09 MED ORDER — ONDANSETRON HCL 4 MG PO TABS: 4.0000 mg | ORAL_TABLET | Freq: Four times a day (QID) | ORAL | Status: DC | PRN

## 2023-11-09 MED ORDER — HEPARIN SODIUM (PORCINE) 5000 UNIT/ML IJ SOLN
5000.0000 [IU] | Freq: Three times a day (TID) | INTRAMUSCULAR | Status: DC
  Administered 2023-11-09 – 2023-11-14 (×16): 5000 [IU] via SUBCUTANEOUS
  Filled 2023-11-09 (×16): qty 1

## 2023-11-09 MED ORDER — SODIUM CHLORIDE 0.9 % IV SOLN: INTRAVENOUS | Status: DC

## 2023-11-09 MED ORDER — INSULIN ASPART 100 UNIT/ML IJ SOLN: 0.0000 [IU] | Freq: Three times a day (TID) | INTRAMUSCULAR | Status: DC

## 2023-11-09 MED ORDER — HYDRALAZINE HCL 25 MG PO TABS
25.0000 mg | ORAL_TABLET | Freq: Three times a day (TID) | ORAL | Status: DC
  Administered 2023-11-09 – 2023-11-14 (×16): 25 mg via ORAL
  Filled 2023-11-09 (×16): qty 1

## 2023-11-09 MED ORDER — ASPIRIN 81 MG PO TBEC
81.0000 mg | DELAYED_RELEASE_TABLET | Freq: Every day | ORAL | Status: DC
  Administered 2023-11-10 – 2023-11-14 (×5): 81 mg via ORAL
  Filled 2023-11-09 (×5): qty 1

## 2023-11-09 MED ORDER — SODIUM CHLORIDE 0.9 % IV SOLN
1.0000 g | INTRAVENOUS | Status: DC
  Administered 2023-11-09 – 2023-11-11 (×3): 1 g via INTRAVENOUS
  Filled 2023-11-09 (×3): qty 10

## 2023-11-09 MED ORDER — ACETAMINOPHEN 325 MG PO TABS
650.0000 mg | ORAL_TABLET | Freq: Four times a day (QID) | ORAL | Status: DC | PRN
  Administered 2023-11-09 – 2023-11-13 (×6): 650 mg via ORAL
  Filled 2023-11-09 (×6): qty 2

## 2023-11-09 MED ORDER — ACETAMINOPHEN 650 MG RE SUPP: 650.0000 mg | Freq: Four times a day (QID) | RECTAL | Status: DC | PRN

## 2023-11-09 MED ORDER — ALLOPURINOL 100 MG PO TABS
200.0000 mg | ORAL_TABLET | Freq: Every day | ORAL | Status: DC
  Administered 2023-11-09 – 2023-11-14 (×6): 200 mg via ORAL
  Filled 2023-11-09 (×6): qty 2

## 2023-11-09 MED ORDER — OSELTAMIVIR PHOSPHATE 30 MG PO CAPS
30.0000 mg | ORAL_CAPSULE | Freq: Every day | ORAL | Status: AC
  Administered 2023-11-09 – 2023-11-13 (×5): 30 mg via ORAL
  Filled 2023-11-09 (×6): qty 1

## 2023-11-09 NOTE — Progress Notes (Signed)
  Echocardiogram 2D Echocardiogram has been performed.  Ocie Doyne RDCS 11/09/2023, 1:27 PM

## 2023-11-09 NOTE — Hospital Course (Signed)
 Luis Carter

## 2023-11-09 NOTE — H&P (Signed)
 History and Physical    Luis Carter NWG:[id number] DOB: 1946-06-27 DOA: 11/08/2023  PCP: Fleet Contras, MD  Patient coming from: home  I have personally briefly reviewed patient's old medical records in [facility]  Chief Complaint: change in mental status  HPI: Luis Carter is a 78 y.o. male with medical history significant of  HTN,CKDIIIb, Asthma/COPD, CHFpef , HLD, DMII ,Gout, remote hx of seizure d/o who presents to ED BIB  brother due to confusion x 1 days. Patient now more alert but still mumbles his answers. That's he has felt unwell for close to one week.  He is not able to describe symptoms but denies fever/chills/ n/v/ or dysuria but note cough.   ED Course:  IN ED  Vitals: temp 98.2, bp 146/70 Hr 105, rr 23, sat 98%  Wbc 6.2, hgb 13.3, plt 204 INR 1.0 Na 137, K 4.7, CL 104,  cr 2.01, AST 104,  Latic 1.2  CXR: IMPRESSION: No active cardiopulmonary disease.  RVP:neg UA: +bacteria,mod LE wbc 11-20 EKG: Nsr non-specific st changes Lipase 53 CE71,62 CTH NAD CTAb/pelvis IMPRESSION: Nodularity along the anterior bladder wall. Cannot exclude bladder cancer. Recommend urologic consultation.  tx ttamifly, ctx   Review of Systems: As per HPI otherwise 10 point review of systems negative.   Past Medical History:  Diagnosis Date   Acute exacerbation of chronic obstructive pulmonary disease (COPD) (HCC) 03/11/2021   Arthritis    "left arm/shoulder; both legs" (12/12/2015)   ASTHMA    since childhood   Cardiomyopathy (HCC) 01/25/2016   Echo 12/13/15 - Mild LVH, EF 45-50%, normal wall motion, grade 1 diastolic dysfunction, mild LAE, mild RVE, mild RAE   CHF (congestive heart failure) (HCC)    COPD 04/14/2007   Coronary artery disease    DIABETES MELLITUS, TYPE II 04/14/2007   GERD (gastroesophageal reflux disease)    GI bleed    Gout    High cholesterol    History of nuclear stress test    a.  Myoview 6/17: EF 54%, no ischemia   HYPERTENSION  04/14/2007   LEG PAIN, RIGHT 05/19/2008   LOW BACK PAIN 04/14/2007   Myocardial infarction (HCC)    ~ 2001/notes 09/13/2001 (03/22/2013)   PANCREATITIS, HX OF 04/14/2007   Pneumonia    "twice" (12/12/2015)   SEIZURE DISORDER 04/14/2007   "used to have them when I was young" (03/22/2013)   Shortness of breath    "related to asthma" (03/22/2013)    Past Surgical History:  Procedure Laterality Date   CORONARY ANGIOPLASTY WITH STENT PLACEMENT     ESOPHAGOGASTRODUODENOSCOPY  12/21/2011   Procedure: ESOPHAGOGASTRODUODENOSCOPY (EGD);  Surgeon: Louis Meckel, MD;  Location: [facility] ENDOSCOPY;  Service: Endoscopy;  Laterality: N/A;     reports that he quit smoking about 7 years ago. His smoking use included cigarettes. He started smoking about 65 years ago. He has a 14.3 pack-year smoking history. He has never used smokeless tobacco. He reports that he does not currently use alcohol. He reports that he does not currently use drugs after having used the following drugs: Cocaine.  No Known Allergies  Family History  Problem Relation Age of Onset   Heart disease Sister    Heart disease Brother    Heart disease Brother    Heart disease Brother    Heart disease Brother     Prior to Admission medications   Medication Sig Start Date End Date Taking? Authorizing Provider  albuterol (PROAIR HFA) 108 (90 BASE) MCG/ACT  inhaler INHALE 2 PUFFS INTO THE LUNGS EVERY 6 (SIX) HOURS AS NEEDED FOR WHEEZING. Patient taking differently: Inhale 2 puffs into the lungs every 6 (six) hours as needed for shortness of breath. 03/05/15   Gordy Savers, MD  allopurinol (ZYLOPRIM) 100 MG tablet Take 2 tablets (200 mg total) by mouth daily. 03/05/15   Gordy Savers, MD  aspirin ([employer] ASPIRIN LOW DOSE) 81 MG EC tablet Take 1 tablet (81 mg total) by mouth daily. Swallow whole. 03/16/21   Leroy Sea, MD  atorvastatin (LIPITOR) 20 MG tablet TAKE 1 TABLET BY MOUTH EVERY DAY Patient taking differently: Take 20 mg  by mouth at bedtime. 06/02/16   Gordy Savers, MD  diclofenac Sodium (VOLTAREN) 1 % GEL Apply 4 g topically 4 (four) times daily as needed (pain). 01/01/23   [provider]  fluticasone (FLONASE) 50 MCG/ACT nasal spray Place 2 sprays into both nostrils 2 (two) times daily. 02/13/21   Arrien, York Ram, MD  fluticasone-salmeterol (ADVAIR) 250-50 MCG/ACT AEPB Inhale 1 puff into the lungs in the morning and at bedtime. 02/13/21   Arrien, York Ram, MD  gabapentin (NEURONTIN) 300 MG capsule Take 1 capsule (300 mg total) by mouth 2 (two) times daily. 01/09/23 02/08/23  Arrien, York Ram, MD  hydrALAZINE (APRESOLINE) 25 MG tablet Take 1 tablet (25 mg total) by mouth every 8 (eight) hours. 02/21/23   Burnadette Pop, MD  ipratropium-albuterol (DUONEB) 0.5-2.5 (3) MG/3ML SOLN Take 3 mLs by nebulization every 6 (six) hours as needed. Patient taking differently: Take 3 mLs by nebulization every 6 (six) hours as needed (shortness of breath). 02/13/21   Arrien, York Ram, MD  lisinopril (ZESTRIL) 20 MG tablet Take 1 tablet (20 mg total) by mouth daily. 02/22/23   Burnadette Pop, MD  metFORMIN (GLUCOPHAGE) 500 MG tablet Take 1 tablet (500 mg total) by mouth 2 (two) times daily. 03/20/22 04/19/22  Dorcas Carrow, MD  montelukast (SINGULAIR) 10 MG tablet Take 10 mg by mouth daily. 12/10/22   [provider]  omeprazole (PRILOSEC) 20 MG capsule Take 1 capsule (20 mg total) by mouth daily. 03/05/15   Gordy Savers, MD  tiZANidine (ZANAFLEX) 4 MG tablet Take 4 mg by mouth 2 (two) times daily as needed for muscle spasms. 10/16/22   [provider]  Vibegron (GEMTESA) 75 MG TABS Take 75 mg by mouth daily.    [provider]  Vitamin D, Ergocalciferol, (DRISDOL) 1.25 MG (50000 UNIT) CAPS capsule Take 50,000 Units by mouth every Monday.    [provider]    Physical Exam: Vitals:   11/08/23 2007 11/08/23 2010 11/09/23 0012 11/09/23 0100  BP: 120/87   (!) 158/98 (!) 147/78  Pulse: (!) 101  87 83  Resp: 20  17 (!) 22  Temp: 98.7 F (37.1 C)  98.9 F (37.2 C)   TempSrc: Oral  Oral   SpO2: 100%  98% 99%  Weight:  81.6 kg    Height:  5\' 3"  (1.6 m)      Constitutional: NAD, calm, comfortable Vitals:   11/08/23 2007 11/08/23 2010 11/09/23 0012 11/09/23 0100  BP: 120/87  (!) 158/98 (!) 147/78  Pulse: (!) 101  87 83  Resp: 20  17 (!) 22  Temp: 98.7 F (37.1 C)  98.9 F (37.2 C)   TempSrc: Oral  Oral   SpO2: 100%  98% 99%  Weight:  81.6 kg    Height:  5\' 3"  (1.6 m)  Eyes: PERRL, lids and conjunctivae normal ENMT: Mucous membranes are moist. Posterior pharynx clear of any exudate or lesions.Normal dentition.  Neck: normal, supple, no masses, no thyromegaly Respiratory: clear to auscultation bilaterally, no wheezing, no crackles. Normal respiratory effort. No accessory muscle use.  Cardiovascular: Regular rate and rhythm, no murmurs / rubs / gallops. No extremity edema. 2+ pedal pulses.  Abdomen: no tenderness, no masses palpated. No hepatosplenomegaly. Bowel sounds positive.  Musculoskeletal: no clubbing / cyanosis. No joint deformity upper and lower extremities. Good ROM, no contractures. Normal muscle tone.  Skin: no rashes, lesions, ulcers. No induration Neurologic: CN 2-12 grossly intact. Sensation intact,. Strength 5/5 in all 4.  Psychiatric:Alert and oriented x 3. Normal mood.    Labs on Admission: I have personally reviewed following labs and imaging studies  CBC: Recent Labs  Lab 11/08/23 2030  WBC 6.2  NEUTROABS 3.9  HGB 13.3  HCT 42.1  MCV 85.4  PLT 204   Basic Metabolic Panel: Recent Labs  Lab 11/08/23 2030  NA 137  K 4.7  CL 104  CO2 22  GLUCOSE 99  BUN 34*  CREATININE 2.01*  CALCIUM 9.1   GFR: Estimated Creatinine Clearance: 27.7 mL/min (A) (by C-G formula based on SCr of 2.01 mg/dL (H)). Liver Function Tests: Recent Labs  Lab 11/08/23 2030  AST 104*  ALT 29  ALKPHOS 86  BILITOT 0.9   PROT 8.3*  ALBUMIN 4.0   Recent Labs  Lab 11/08/23 2200  LIPASE 53*   No results for input(s): "AMMONIA" in the last 168 hours. Coagulation Profile: Recent Labs  Lab 11/08/23 2030  INR 1.0   Cardiac Enzymes: Recent Labs  Lab 11/08/23 2200  CKTOTAL 3,480*   BNP (last 3 results) No results for input(s): "PROBNP" in the last 8760 hours. HbA1C: No results for input(s): "HGBA1C" in the last 72 hours. CBG: No results for input(s): "GLUCAP" in the last 168 hours. Lipid Profile: No results for input(s): "CHOL", "HDL", "LDLCALC", "TRIG", "CHOLHDL", "LDLDIRECT" in the last 72 hours. Thyroid Function Tests: No results for input(s): "TSH", "T4TOTAL", "FREET4", "T3FREE", "THYROIDAB" in the last 72 hours. Anemia Panel: No results for input(s): "VITAMINB12", "FOLATE", "FERRITIN", "TIBC", "IRON", "RETICCTPCT" in the last 72 hours. Urine analysis:    Component Value Date/Time   COLORURINE YELLOW 11/09/2023 0013   APPEARANCEUR HAZY (A) 11/09/2023 0013   LABSPEC 1.024 11/09/2023 0013   PHURINE 5.0 11/09/2023 0013   GLUCOSEU NEGATIVE 11/09/2023 0013   HGBUR MODERATE (A) 11/09/2023 0013   BILIRUBINUR NEGATIVE 11/09/2023 0013   BILIRUBINUR negative 01/03/2023 1321   KETONESUR 5 (A) 11/09/2023 0013   PROTEINUR 100 (A) 11/09/2023 0013   UROBILINOGEN 1.0 01/03/2023 1321   UROBILINOGEN 1.0 02/07/2022 1100   NITRITE NEGATIVE 11/09/2023 0013   LEUKOCYTESUR MODERATE (A) 11/09/2023 0013    Radiological Exams on Admission: CT ABDOMEN PELVIS W CONTRAST Result Date: 11/08/2023 CLINICAL DATA:  Abdominal pain EXAM: CT ABDOMEN AND PELVIS WITH CONTRAST TECHNIQUE: Multidetector CT imaging of the abdomen and pelvis was performed using the standard protocol following bolus administration of intravenous contrast. RADIATION DOSE REDUCTION: This exam was performed according to the departmental dose-optimization program which includes automated exposure control, adjustment of the mA and/or kV according  to patient size and/or use of iterative reconstruction technique. CONTRAST:  60mL OMNIPAQUE IOHEXOL 350 MG/ML SOLN COMPARISON:  02/22/2023 FINDINGS: Lower chest: No acute abnormality Hepatobiliary: No focal hepatic abnormality. Gallbladder unremarkable. Pancreas: No focal abnormality or ductal dilatation. Spleen: No focal abnormality.  Normal size. Adrenals/Urinary  Tract: Adrenal glands normal. Stable cyst off the upper pole of the right kidney appears simple. No follow-up imaging recommended. No stones or hydronephrosis. There is nodularity noted along the anterior bladder wall. This has a similar appearance to prior study. Appearance is concerning for possible bladder cancer. Several small bladder wall diverticula noted. Stomach/Bowel: Normal appendix. Stomach, large and small bowel grossly unremarkable. Vascular/Lymphatic: Aortoiliac atherosclerosis. No evidence of aneurysm or adenopathy. Reproductive: No visible focal abnormality. Other: No free fluid or free air. Musculoskeletal: No acute bony abnormality. IMPRESSION: Nodularity along the anterior bladder wall. Cannot exclude bladder cancer. Recommend urologic consultation. Aortoiliac atherosclerosis. Electronically Signed   By: Charlett Nose M.D.   On: 11/08/2023 23:50   CT Head Wo Contrast Result Date: 11/08/2023 CLINICAL DATA:  Altered level of consciousness, suspected sepsis EXAM: CT HEAD WITHOUT CONTRAST TECHNIQUE: Contiguous axial images were obtained from the base of the skull through the vertex without intravenous contrast. RADIATION DOSE REDUCTION: This exam was performed according to the departmental dose-optimization program which includes automated exposure control, adjustment of the mA and/or kV according to patient size and/or use of iterative reconstruction technique. COMPARISON:  01/03/2023 FINDINGS: Brain: No acute infarct or hemorrhage. Stable hypodensities within the periventricular white matter compatible with chronic small vessel ischemic  changes. Lateral ventricles and midline structures are unremarkable. No acute extra-axial fluid collections. No mass effect. Vascular: No hyperdense vessel or unexpected calcification. Skull: Normal. Negative for fracture or focal lesion. Sinuses/Orbits: No acute finding. Other: None. IMPRESSION: 1. Stable head CT, no acute intracranial process. Electronically Signed   By: Sharlet Salina M.D.   On: 11/08/2023 23:45   DG Chest 2 View Result Date: 11/08/2023 CLINICAL DATA:  Suspected sepsis EXAM: CHEST - 2 VIEW COMPARISON:  CT and radiograph 02/22/2023 FINDINGS: Stable cardiomediastinal silhouette. Loop recorder. Scarring in the left mid lung laterally. No pleural effusion or pneumothorax. Shrapnel overlying the left upper chest/axilla. IMPRESSION: No active cardiopulmonary disease. Electronically Signed   By: Minerva Fester M.D.   On: 11/08/2023 21:10    EKG: Independently reviewed. See above  Assessment/Plan   Influenza A -no noted hypoxia  - start tamiflu  Mild Rhabdomyolysis -continue with ivfs  -repeat CPK  UTI -ctx  -f/u  with urine culture and de-escalate abx as able   Acute metabolic encephalopathy due to above -treat underlying cause   Mild AKI onCKDIIIB -hold nephrotoxic medication  -monitor urine out put    Abnormal CE -no chest pain , EKG no hyper acute changes  -continue to cycle CE - echo in am a -asa    Bladder mass - will need urology consult   HTN -stable  -resume home regimen in am if renal function stable     Asthma/COPD -no acute exacerbation  -resume home regimen with advair , prn nebs   CHFpef  -no acute exacerbation  -resume home  regimen as able    HLD -continue atorvastatin    DMII -iss/fs    Gout -continue allopurinol    DVT prophylaxis: heparin Code Status: full/ as discussed per patient wishes in event of cardiac arrest  Family Communication: none at bedside Disposition Plan: patient  expected to be admitted greater than 2  midnights  Consults called:will need urology  consult on discharge Admission status:    Lurline Del MD [facility]   If 7PM-7AM, please contact night-coverage [email or web address] Password [email or web address]  11/09/2023, 1:46 AM

## 2023-11-09 NOTE — ED Provider Notes (Signed)
 Signed out to me by Dr. Criss Alvine.  Patient brought in by brother because of altered mental status.  Workup reveals influenza.  He does have a mild AKI.  Patient with nontraumatic rhabdomyolysis.  CT head does not show any acute abnormality.  CT abdomen and pelvis has returned.  No acute abnormality, nodularity on the anterior bladder wall that would require further workup as an outpatient.  Patient will require hospitalization for further management of the multiple abnormality seen above.   Gilda Crease, MD 11/09/23 [phone]

## 2023-11-09 NOTE — ED Notes (Signed)
 Snack of crackers, PB, and soda provided for pt at this time d/t CBG.

## 2023-11-09 NOTE — Progress Notes (Signed)
 Same day note  Luis Carter is a 78 y.o. male with medical history significant of pretension, CKD stage IIIb, asthma COPD, congestive heart failure, hyperlipidemia, type 2 diabetes, gout, remote history of seizure disorder presented to the hospital with confusion for 1 day.  Has not been feeling well for a week.  Unable to describe further symptoms by the patient.  In the ED patient was slightly tachycardic.  Labs were notable for creatinine of 2.0 with AST elevated at 104.  Chest x-ray showed no acute pulmonary disease.  Respiratory viral panel wa for influenza.  Urinalysis showed white cells 11-20 with bacteria.  CK level was elevated at 3480.  EKG was nonspecific.  CT head was negative for acute findings.  CT scan of the abdomen and pelvis showed nodularity along the anterior bladder wall could not exclude bladder cancer.  Patient was given Rocephin IV and was considered for admission to the hospital for further evaluation and treatment   Patient seen and examined at bedside.  Patient was admitted to the hospital for confusion.  At the time of my evaluation, patient complains of cough, difficulty understanding speech.  She complains of the leg hurting.  Physical examination reveals elderly male, Communicative, difficult understand speech.  Laboratory data and imaging was reviewed  Assessment and Plan.  Influenza A No hypoxia.  Continue Tamiflu.   Mild Rhabdomyolysis Continue IV fluids.  CK level on presentation at 3480.  Check CK levels in AM.   UTI Abnormal urinalysis altered mental status.  Will continue with Rocephin.  Follow urine cultures.   Acute metabolic encephalopathy due to influenza and UTI. Continue supportive care.  Delirium precautions.  Possible AKI on CKD stage IIIb. Will continue to monitor creatinine levels.  On IV fluids.  Likely baseline creatinine between 1.3-1.7.   Elevated and flat troponins. -Nonspecific.  No chest pain or EKG changes.  Check 2D  echocardiogram on aspirin.     Urinary Bladder nodularity. - will need urology consult at some point   HTN Elevated blood pressure at this time.  Lisinopril at home.  Will hold for now.  Resume oral hydralazine from home.      Asthma/COPD Continue Advair as needed nebs.  Appears to be stable.   Chronic diastolic heart failure. Check 2D echocardiogram.  Compensated at this time.  Hold lisinopril.  Hyperlipidemia -continue atorvastatin    Diabetes type II. Patient is on metformin at home.  Will hold.  Continue sliding scale insulin, diabetic diet.   Gout -continue allopurinol   No Charge  Signed,  Tenny Craw, MD [facility]

## 2023-11-09 NOTE — ED Notes (Signed)
Breakfast tray delivered at this time.

## 2023-11-10 DIAGNOSIS — J101 Influenza due to other identified influenza virus with other respiratory manifestations: Secondary | ICD-10-CM | POA: Diagnosis not present

## 2023-11-10 LAB — CBC
HCT: 39.5 % (ref 39.0–52.0)
Hemoglobin: 11.8 g/dL — ABNORMAL LOW (ref 13.0–17.0)
MCH: 27.1 pg (ref 26.0–34.0)
MCHC: 29.9 g/dL — ABNORMAL LOW (ref 30.0–36.0)
MCV: 90.8 fL (ref 80.0–100.0)
Platelets: 152 10*3/uL (ref 150–400)
RBC: 4.35 MIL/uL (ref 4.22–5.81)
RDW: 15.3 % (ref 11.5–15.5)
WBC: 5.2 10*3/uL (ref 4.0–10.5)
nRBC: 0 % (ref 0.0–0.2)

## 2023-11-10 LAB — BASIC METABOLIC PANEL
Anion gap: 12 (ref 5–15)
BUN: 36 mg/dL — ABNORMAL HIGH (ref 8–23)
CO2: 16 mmol/L — ABNORMAL LOW (ref 22–32)
Calcium: 7.7 mg/dL — ABNORMAL LOW (ref 8.9–10.3)
Chloride: 109 mmol/L (ref 98–111)
Creatinine, Ser: 1.67 mg/dL — ABNORMAL HIGH (ref 0.61–1.24)
GFR, Estimated: 41 mL/min — ABNORMAL LOW (ref >60)
Glucose, Bld: 87 mg/dL (ref 70–99)
Potassium: 4.2 mmol/L (ref 3.5–5.1)
Sodium: 137 mmol/L (ref 135–145)

## 2023-11-10 LAB — GLUCOSE, CAPILLARY
Glucose-Capillary: 44 mg/dL — CL (ref 70–99)
Glucose-Capillary: 67 mg/dL — ABNORMAL LOW (ref 70–99)
Glucose-Capillary: 101 mg/dL — ABNORMAL HIGH (ref 70–99)
Glucose-Capillary: 104 mg/dL — ABNORMAL HIGH (ref 70–99)
Glucose-Capillary: 86 mg/dL (ref 70–99)

## 2023-11-10 LAB — MAGNESIUM: Magnesium: 1.6 mg/dL — ABNORMAL LOW (ref 1.7–2.4)

## 2023-11-10 LAB — CK: Total CK: 1692 U/L — ABNORMAL HIGH (ref 49–397)

## 2023-11-10 LAB — CBG MONITORING, ED: Glucose-Capillary: 89 mg/dL (ref 70–99)

## 2023-11-10 MED ORDER — DEXTROSE 5 % IV SOLN: INTRAVENOUS | Status: AC

## 2023-11-10 MED ORDER — CEFDINIR 300 MG PO CAPS: 300.0000 mg | ORAL_CAPSULE | ORAL | 0 refills | Status: DC

## 2023-11-10 MED ORDER — MAGNESIUM OXIDE -MG SUPPLEMENT 400 (240 MG) MG PO TABS
400.0000 mg | ORAL_TABLET | Freq: Two times a day (BID) | ORAL | Status: DC
  Administered 2023-11-10 – 2023-11-14 (×9): 400 mg via ORAL
  Filled 2023-11-10 (×9): qty 1

## 2023-11-10 MED ORDER — OSELTAMIVIR PHOSPHATE 30 MG PO CAPS: 30.0000 mg | ORAL_CAPSULE | Freq: Every day | ORAL | 0 refills | Status: DC

## 2023-11-10 MED ORDER — MAGNESIUM OXIDE -MG SUPPLEMENT 400 (240 MG) MG PO TABS: 400.0000 mg | ORAL_TABLET | Freq: Two times a day (BID) | ORAL | 0 refills | Status: DC

## 2023-11-10 MED ORDER — DEXTROSE 50 % IV SOLN
INTRAVENOUS | Status: AC
  Filled 2023-11-10: qty 50

## 2023-11-10 NOTE — Evaluation (Signed)
 Physical Therapy Evaluation Patient Details Name: Luis Carter MRN: [id number] DOB: 02-24-1946 Today's Date: 11/10/2023  History of Present Illness  Pt is an 78 y.o. male who presented 11/08/23 with confusion. Admitted with mild rhabdomyolysis and acute metabolic encephalopathy due to influenza A and UTI. PMH: CKD 3B, seizures, HTN, HLD, gout, low back pain, DM, COPD, MI, diastolic CHF, asthma   Clinical Impression  Pt presents with condition above and deficits mentioned below, see PT Problem List. PTA, he was mod I for functional mobility, primarily utilizing his SPC but intermittently utilizing a RW. He lives with his sister, that reportedly is sick too. They live in a 2-level house with a level entrance. Pt is able to stay on the main level, but the shower is upstairs. However, he reports he sponge bathes. Currently, pt is internally distracted and self limiting due to pain "everywhere". He only tolerated transitioning supine <> sit EOB and sitting EOB a couple minutes before declining to attempt transfers and returning himself to supine. He needed minA to transition supine to sit EOB. At this time, based on pt's poor activity tolerance, potentially limited assist available at d/c pending sister's health, and drastic functional decline, he could benefit from short-term inpatient rehab, < 3 hours/day. Will continue to follow acutely.        If plan is discharge home, recommend the following: A lot of help with walking and/or transfers;A lot of help with bathing/dressing/bathroom;Assistance with cooking/housework;Assist for transportation;Help with stairs or ramp for entrance;Direct supervision/assist for financial management;Direct supervision/assist for medications management   Can travel by private vehicle   No    Equipment Recommendations BSC/3in1;Wheelchair (measurements PT);Wheelchair cushion (measurements PT) (pending progress)  Recommendations for Other Services  OT consult     Functional Status Assessment Patient has had a recent decline in their functional status and demonstrates the ability to make significant improvements in function in a reasonable and predictable amount of time.     Precautions / Restrictions Precautions Precautions: Fall Restrictions Weight Bearing Restrictions Per Provider Order: No      Mobility  Bed Mobility Overal bed mobility: Needs Assistance Bed Mobility: Supine to Sit, Sit to Supine     Supine to sit: Min assist, HOB elevated Sit to supine: Contact guard assist, HOB elevated   General bed mobility comments: Pt needed extra time and minA to ascend his trunk and scoot his hips to EOB to sit up. Pt reporting too much pain to attempt transfers and only sat EOB a couple minutes before returning himself to supine with CGA for safety. Attempted to encourage pt to try to transfer OOB, but pt declined.    Transfers                   General transfer comment: Pt declined due to pain    Ambulation/Gait               General Gait Details: pt declined attempts to stand due to pain  Stairs            Wheelchair Mobility     Tilt Bed    Modified Rankin (Stroke Patients Only)       Balance Overall balance assessment: Needs assistance Sitting-balance support: Bilateral upper extremity supported, Feet unsupported Sitting balance-Leahy Scale: Poor Sitting balance - Comments: Pt pushing through his UEs, likely to manage pain, while sitting edge of stretcher with legs dangling, no LOB, CGA for safety. Pt only tolerated sitting ~2 min before returning himself to  supine due to pain       Standing balance comment: pt declined                             Pertinent Vitals/Pain Pain Assessment Pain Assessment: Faces Faces Pain Scale: Hurts whole lot Pain Location: "everywhere" Pain Descriptors / Indicators: Discomfort, Grimacing, Guarding, Moaning Pain Intervention(s): Limited activity within  patient's tolerance, Monitored during session, Repositioned, Patient requesting pain meds-RN notified    Home Living Family/patient expects to be discharged to:: Private residence Living Arrangements: Other relatives (sister) Available Help at Discharge: Family;Available 24 hours/day (sister is also sick per pt though) Type of Home: House Home Access: Level entry       Home Layout: Two level;Able to live on main level with bedroom/bathroom;Other (Comment) (shower is upstairs, but pt sponge bathes) Home Equipment: Rollator (4 wheels);Cane - single point      Prior Function Prior Level of Function : Independent/Modified Independent             Mobility Comments: Mod I using SPC majority of time, intermittently using RW       Extremity/Trunk Assessment   Upper Extremity Assessment Upper Extremity Assessment: Defer to OT evaluation    Lower Extremity Assessment Lower Extremity Assessment: Generalized weakness (difficult to assess due to pain today)       Communication   Communication Communication: No apparent difficulties    Cognition Arousal: Alert Behavior During Therapy: Flat affect   PT - Cognitive impairments: No family/caregiver present to determine baseline                       PT - Cognition Comments: Pt internally distracted by pain, thereby difficult to assess cognition accurately. Pt self limiting due to pain today Following commands: Impaired Following commands impaired: Only follows one step commands consistently, Follows one step commands with increased time     Cueing Cueing Techniques: Verbal cues, Tactile cues     General Comments      Exercises     Assessment/Plan    PT Assessment Patient needs continued PT services  PT Problem List Decreased strength;Decreased activity tolerance;Decreased balance;Decreased mobility;Decreased cognition;Pain       PT Treatment Interventions DME instruction;Gait training;Stair  training;Functional mobility training;Therapeutic activities;Therapeutic exercise;Balance training;Neuromuscular re-education;Cognitive remediation;Patient/family education    PT Goals (Current goals can be found in the Care Plan section)  Acute Rehab PT Goals Patient Stated Goal: to reduce pain and get better PT Goal Formulation: With patient Time For Goal Achievement: 11/24/23 Potential to Achieve Goals: Good    Frequency Min 2X/week     Co-evaluation               AM-PAC PT "6 Clicks" Mobility  Outcome Measure Help needed turning from your back to your side while in a flat bed without using bedrails?: A Little Help needed moving from lying on your back to sitting on the side of a flat bed without using bedrails?: A Little Help needed moving to and from a bed to a chair (including a wheelchair)?: Total Help needed standing up from a chair using your arms (e.g., wheelchair or bedside chair)?: Total Help needed to walk in hospital room?: Total Help needed climbing 3-5 steps with a railing? : Total 6 Click Score: 10    End of Session   Activity Tolerance: Patient limited by pain Patient left: in bed;with call bell/phone within reach Nurse Communication: Mobility  status;Patient requests pain meds PT Visit Diagnosis: Unsteadiness on feet (R26.81);Other abnormalities of gait and mobility (R26.89);Muscle weakness (generalized) (M62.81);Difficulty in walking, not elsewhere classified (R26.2);Pain Pain - Right/Left:  ("everywhere") Pain - part of body:  ("everywhere")    Time: 4098-1191 PT Time Calculation (min) (ACUTE ONLY): 15 min   Charges:   PT Evaluation $PT Eval Moderate Complexity: 1 Mod   PT General Charges $$ ACUTE PT VISIT: 1 Visit         Virgil Benedict, PT, DPT Acute Rehabilitation Services  Office: [phone]   Bettina Gavia 11/10/2023, 12:59 PM

## 2023-11-10 NOTE — Progress Notes (Signed)
 S/W patient's family/emergency contact, Krystal Delduca (sister) by phone to make aware of admission and current room number.  Sister requested basic status update only; currently unable to visit due to personal medical issues.  She requested this RN communicate to patient to give her a call when able.

## 2023-11-10 NOTE — Progress Notes (Signed)
 PROGRESS NOTE  ZEFERINO MOUNTS ZOX:[id number] DOB: 01/04/1946 DOA: 11/08/2023 PCP: Fleet Contras, MD   LOS: 1 day   Brief narrative:  DIARRA KOS is a 78 y.o. male with medical history significant of pretension, CKD stage IIIb, asthma COPD, congestive heart failure, hyperlipidemia, type 2 diabetes, gout, remote history of seizure disorder presented to the hospital with confusion for 1 day.  Has not been feeling well for a week.  Unable to describe further symptoms by the patient.  In the ED, patient was slightly tachycardic.  Labs were notable for creatinine of 2.0 with AST elevated at 104.  Chest x-ray showed no acute pulmonary disease.  Respiratory viral panel wa for influenza.  Urinalysis showed white cells 11-20 with bacteria.  CK level was elevated at 3480.  EKG was nonspecific.  CT head was negative for acute findings.  CT scan of the abdomen and pelvis showed nodularity along the anterior bladder wall could not exclude bladder cancer.  Patient was given Rocephin IV and was considered for admission to the hospital for further evaluation and treatment    Assessment/Plan: Principal Problem:   Influenza A Active Problems:   Stage 3b chronic kidney disease (CKD) (HCC)   Hypertension   UTI (urinary tract infection)   HLD (hyperlipidemia)  Influenza A No hypoxia.  Continue Tamiflu to complete the course.   Mild Rhabdomyolysis IV fluids.  CK level on presentation at 3480.  Check CK level today at 1692.  Encouraged oral hydration.   UTI Abnormal urinalysis altered mental status.  On IV Rocephin.  Follow urine cultures, pending.   Acute metabolic encephalopathy due to influenza and UTI. Continue supportive care.  Delirium precautions.  As significantly improved.   Possible AKI on CKD stage IIIb. Will continue to monitor creatinine levels.  IV fluids.  Creatinine today at 1.6.  Likely baseline creatinine between 1.3-1.7.   Elevated and flat troponins. -Nonspecific.  No chest  pain or EKG changes.  Echocardiogram with LV ejection fraction of 60 to 65% with grade 1 diastolic dysfunction with no regional wall motion abnormality.    Urinary Bladder nodularity. - will need urology consult at some point we will inform urology.   HTN Elevated blood pressure at this time.  Lisinopril at home.  Will hold for now.  Resume oral hydralazine from home.  Monitor blood pressure.    Asthma/COPD Continue Advair as needed nebs.  Appears to be stable.  Mild cough.   Chronic diastolic heart failure. Check 2D echocardiogram.  Compensated at this time.  Hold lisinopril for now.  Latest creatinine of 1.6   Hyperlipidemia -continue atorvastatin    Diabetes type II. Patient is on metformin at home.  Will hold.  Continue sliding scale insulin, diabetic diet.  Latest POC glucose of 89.   Gout -continue allopurinol  DVT prophylaxis: heparin injection 5,000 Units Start: 11/09/23 0600   Disposition: Skilled nursing facility as per PT recommendation.  Status is: Inpatient  Remains inpatient appropriate because: Pending clinical improvement, need for rehabilitation    Code Status:     Code Status: Full Code  Family Communication: Spoke with the patient's sister on the phone and updated her about the clinical condition of the patient.  Consultants: Will consult urology  Procedures: None  Anti-infectives:  Tamiflu, Rocephin.  Anti-infectives (From admission, onward)    Start     Dose/Rate Route Frequency Ordered Stop   11/11/23 0000  oseltamivir (TAMIFLU) 30 MG capsule        30  mg Oral Daily 11/10/23 1013 11/15/23 2359             11/09/23 0215  oseltamivir (TAMIFLU) capsule 30 mg        30 mg Oral Daily 11/09/23 0147 11/14/23 0959   11/09/23 0200  cefTRIAXone (ROCEPHIN) 1 g in sodium chloride 0.9 % 100 mL IVPB        1 g 200 mL/hr over 30 Minutes Intravenous Every 24 hours 11/09/23 0147         Subjective: Today, patient was seen and examined at bedside.   States that he is very weak and below the hip area.  Complains of shortness of breath getting little better with some cough.  No nausea vomiting.  Had some dysuria initially which has improved at this time.  Objective: Vitals:   11/10/23 0530 11/10/23 0745  BP:  (!) 147/101  Pulse:  84  Resp:  (!) 21  Temp: 98.4 F (36.9 C)   SpO2:  99%    Intake/Output Summary (Last 24 hours) at 11/10/2023 1037 Last data filed at 11/10/2023 0346 Gross per 24 hour  Intake 1110.51 ml  Output --  Net 1110.51 ml   Filed Weights   11/08/23 2010  Weight: 81.6 kg   Body mass index is 31.87 kg/m.   Physical Exam:  GENERAL: Patient is alert awake and oriented. Not in obvious distress.  More alert awake and communicative today.  Speech more distinct.  Elderly male.  Obese built HENT: No scleral pallor or icterus. Pupils equally reactive to light. Oral mucosa is moist NECK: is supple, no gross swelling noted. CHEST: Clear to auscultation. No crackles or wheezes.  Diminished breath sounds bilaterally. CVS: S1 and S2 heard, no murmur. Regular rate and rhythm.  ABDOMEN: Soft, non-tender, bowel sounds are present. EXTREMITIES: No edema. CNS: Cranial nerves are intact. No focal motor deficits.  Speech more distinct. SKIN: warm and dry without rashes.  Data Review: I have personally reviewed the following laboratory data and studies,  CBC: Recent Labs  Lab 11/08/23 2030 11/09/23 0513 11/10/23 0530  WBC 6.2 6.0 5.2  NEUTROABS 3.9  --   --   HGB 13.3 11.8* 11.8*  HCT 42.1 39.2 39.5  MCV 85.4 89.7 90.8  PLT 204 187 152   Basic Metabolic Panel: Recent Labs  Lab 11/08/23 2030 11/09/23 0513 11/10/23 0530  NA 137 136 137  K 4.7 4.4 4.2  CL 104 106 109  CO2 22 15* 16*  GLUCOSE 99 81 87  BUN 34* 35* 36*  CREATININE 2.01* 2.02* 1.67*  CALCIUM 9.1 8.0* 7.7*  MG  --   --  1.6*   Liver Function Tests: Recent Labs  Lab 11/08/23 2030 11/09/23 0513  AST 104* 92*  ALT 29 26  ALKPHOS 86 68   BILITOT 0.9 0.8  PROT 8.3* 7.1  ALBUMIN 4.0 3.4*   Recent Labs  Lab 11/08/23 2200  LIPASE 53*   No results for input(s): "AMMONIA" in the last 168 hours. Cardiac Enzymes: Recent Labs  Lab 11/08/23 2200 11/10/23 0530  CKTOTAL 3,480* 1,692*   BNP (last 3 results) Recent Labs    01/03/23 1505 02/22/23 2156  BNP 381.4* 128.9*    ProBNP (last 3 results) No results for input(s): "PROBNP" in the last 8760 hours.  CBG: Recent Labs  Lab 11/09/23 0916 11/09/23 1153 11/09/23 1638 11/09/23 2251 11/10/23 0806  GLUCAP 77 101* 78 87 89   Recent Results (from the past 240 hours)  Culture, blood (  Routine x 2)     Status: None (Preliminary result)   Collection Time: 11/08/23  8:30 PM   Specimen: BLOOD  Result Value Ref Range Status   Specimen Description BLOOD RIGHT ANTECUBITAL  Final   Special Requests   Final    BOTTLES DRAWN AEROBIC AND ANAEROBIC Blood Culture adequate volume   Culture   Final    NO GROWTH 2 DAYS Performed at [facility], [address] [place], [place] [address]    Report Status PENDING  Incomplete  Culture, blood (Routine x 2)     Status: None (Preliminary result)   Collection Time: 11/08/23  8:32 PM   Specimen: BLOOD  Result Value Ref Range Status   Specimen Description BLOOD LEFT ANTECUBITAL  Final   Special Requests   Final    BOTTLES DRAWN AEROBIC AND ANAEROBIC Blood Culture adequate volume   Culture   Final    NO GROWTH 2 DAYS Performed at [facility], [address] [place], [place] [address]    Report Status PENDING  Incomplete  Resp panel by RT-PCR (RSV, Flu A&B, Covid) Anterior Nasal Swab     Status: Abnormal   Collection Time: 11/08/23  9:46 PM   Specimen: Anterior Nasal Swab  Result Value Ref Range Status   SARS Coronavirus 2 by RT PCR NEGATIVE NEGATIVE Final   Influenza A by PCR POSITIVE (A) NEGATIVE Final   Influenza B by PCR NEGATIVE NEGATIVE Final    Comment: (NOTE) The Xpert Xpress SARS-CoV-2/FLU/RSV plus  assay is intended as an aid in the diagnosis of influenza from Nasopharyngeal swab specimens and should not be used as a sole basis for treatment. Nasal washings and aspirates are unacceptable for Xpert Xpress SARS-CoV-2/FLU/RSV testing.  Fact Sheet for Patients: BloggerCourse.com  Fact Sheet for Healthcare Providers: SeriousBroker.it  This test is not yet approved or cleared by the [place] FDA and has been authorized for detection and/or diagnosis of SARS-CoV-2 by FDA under an Emergency Use Authorization (EUA). This EUA will remain in effect (meaning this test can be used) for the duration of the COVID-19 declaration under Section 564(b)(1) of the Act, 21 U.S.C. section 360bbb-3(b)(1), unless the authorization is terminated or revoked.     Resp Syncytial Virus by PCR NEGATIVE NEGATIVE Final    Comment: (NOTE) Fact Sheet for Patients: BloggerCourse.com  Fact Sheet for Healthcare Providers: SeriousBroker.it  This test is not yet approved or cleared by the [place] FDA and has been authorized for detection and/or diagnosis of SARS-CoV-2 by FDA under an Emergency Use Authorization (EUA). This EUA will remain in effect (meaning this test can be used) for the duration of the COVID-19 declaration under Section 564(b)(1) of the Act, 21 U.S.C. section 360bbb-3(b)(1), unless the authorization is terminated or revoked.  Performed at [facility], [address], [place], [place] [address]   Urine Culture     Status: None (Preliminary result)   Collection Time: 11/09/23 12:13 AM   Specimen: Urine, Random  Result Value Ref Range Status   Specimen Description URINE, RANDOM  Final   Special Requests NONE Reflexed from [id number]  Final   Culture   Final    CULTURE REINCUBATED FOR BETTER GROWTH Performed at [facility], [address], [place], [place] [address]     Report Status PENDING  Incomplete     Studies: ECHOCARDIOGRAM COMPLETE Result Date: 11/09/2023    ECHOCARDIOGRAM REPORT   Patient Name:   MURICE BARBAR  Pischke Date of Exam: 11/09/2023 Medical Rec #:  [id number]          Height:       63.0 in Accession #:    [id number]         Weight:       179.9 lb Date of Birth:  Jul 08, 1946           BSA:          1.849 m Patient Age:    78 years           BP:           156/81 mmHg Patient Gender: M                  HR:           78 bpm. Exam Location:  Inpatient Procedure: 2D Echo, 3D Echo, Cardiac Doppler, Color Doppler and Strain Analysis            (Both Spectral and Color Flow Doppler were utilized during            procedure). Indications:    Fever, elevated troponin  History:        Patient has prior history of Echocardiogram examinations, most                 recent 01/04/2023. Cardiomyopathy, COPD, Arrythmias:Bradycardia,                 Signs/Symptoms:Chest Pain; Risk Factors:Dyslipidemia, Diabetes,                 Former Smoker and Hypertension. CKD stage 3.  Sonographer:    Vern Claude Referring Phys: [id number] IONGEX Arlynn Stare IMPRESSIONS  1. Left ventricular ejection fraction, by estimation, is 60 to 65%. Left ventricular ejection fraction by 3D volume is 58 %. The left ventricle has normal function. The left ventricle has no regional wall motion abnormalities. Left ventricular diastolic  parameters are consistent with Grade I diastolic dysfunction (impaired relaxation). The average left ventricular global longitudinal strain is -17.0 %. The global longitudinal strain is abnormal.  2. Right ventricular systolic function is normal. The right ventricular size is normal. Tricuspid regurgitation signal is inadequate for assessing PA pressure.  3. The mitral valve is normal in structure. No evidence of mitral valve regurgitation. No evidence of mitral stenosis.  4. The aortic valve is tricuspid. There is moderate calcification of the aortic valve. Aortic valve regurgitation is  not visualized. Aortic valve sclerosis/calcification is present, without any evidence of aortic stenosis.  5. Aortic dilatation noted. There is mild dilatation of the ascending aorta, measuring 43 mm.  6. The inferior vena cava is normal in size with greater than 50% respiratory variability, suggesting right atrial pressure of 3 mmHg. FINDINGS  Left Ventricle: Left ventricular ejection fraction, by estimation, is 60 to 65%. Left ventricular ejection fraction by 3D volume is 58 %. The left ventricle has normal function. The left ventricle has no regional wall motion abnormalities. The average left ventricular global longitudinal strain is -17.0 %. Strain was performed and the global longitudinal strain is abnormal. The left ventricular internal cavity size was normal in size. There is no left ventricular hypertrophy. Left ventricular diastolic parameters are consistent with Grade I diastolic dysfunction (impaired relaxation). Right Ventricle: The right ventricular size is normal. No increase in right ventricular wall thickness. Right ventricular systolic function is normal. Tricuspid regurgitation signal is inadequate for assessing PA pressure. Left Atrium: Left atrial size was normal in size.  Right Atrium: Right atrial size was normal in size. Pericardium: There is no evidence of pericardial effusion. Mitral Valve: The mitral valve is normal in structure. There is mild calcification of the mitral valve leaflet(s). No evidence of mitral valve regurgitation. No evidence of mitral valve stenosis. MV peak gradient, 3.4 mmHg. The mean mitral valve gradient  is 1.0 mmHg. Tricuspid Valve: The tricuspid valve is normal in structure. Tricuspid valve regurgitation is not demonstrated. Aortic Valve: The aortic valve is tricuspid. There is moderate calcification of the aortic valve. Aortic valve regurgitation is not visualized. Aortic valve sclerosis/calcification is present, without any evidence of aortic stenosis. Aortic  valve mean gradient measures 4.0 mmHg. Aortic valve peak gradient measures 7.2 mmHg. Aortic valve area, by VTI measures 2.31 cm. Pulmonic Valve: The pulmonic valve was normal in structure. Pulmonic valve regurgitation is not visualized. Aorta: The aortic root is normal in size and structure and aortic dilatation noted. There is mild dilatation of the ascending aorta, measuring 43 mm. Venous: The inferior vena cava is normal in size with greater than 50% respiratory variability, suggesting right atrial pressure of 3 mmHg. IAS/Shunts: No atrial level shunt detected by color flow Doppler. Additional Comments: 3D was performed not requiring image post processing on an independent workstation and was normal.  LEFT VENTRICLE PLAX 2D LVIDd:         4.10 cm         Diastology LVIDs:         2.80 cm         LV e' medial:    6.09 cm/s LV PW:         0.60 cm         LV E/e' medial:  6.7 LV IVS:        0.90 cm         LV e' lateral:   8.27 cm/s LVOT diam:     2.00 cm         LV E/e' lateral: 4.9 LV SV:         46 LV SV Index:   25              2D Longitudinal LVOT Area:     3.14 cm        Strain                                2D Strain GLS   -17.0 %                                Avg: LV Volumes (MOD) LV vol d, MOD    93.5 ml       3D Volume EF A2C:                           LV 3D EF:    Left LV vol d, MOD    105.0 ml                   ventricul A4C:                                        ar LV vol s, MOD    27.1 ml  ejection A2C:                                        fraction LV vol s, MOD    40.2 ml                    by 3D A4C:                                        volume is LV SV MOD A2C:   66.4 ml                    58 %. LV SV MOD A4C:   105.0 ml LV SV MOD BP:    67.4 ml                                3D Volume EF:                                3D EF:        58 %                                LV EDV:       199 ml                                LV ESV:       83 ml                                LV  SV:        115 ml RIGHT VENTRICLE             IVC RV Basal diam:  3.20 cm     IVC diam: 0.90 cm RV Mid diam:    2.60 cm RV S prime:     14.40 cm/s TAPSE (M-mode): 2.1 cm LEFT ATRIUM             Index        RIGHT ATRIUM          Index LA diam:        2.40 cm 1.30 cm/m   RA Area:     9.83 cm LA Vol (A2C):   19.1 ml 10.33 ml/m  RA Volume:   17.60 ml 9.52 ml/m LA Vol (A4C):   21.3 ml 11.52 ml/m LA Biplane Vol: 20.4 ml 11.04 ml/m  AORTIC VALVE                    PULMONIC VALVE AV Area (Vmax):    2.21 cm     PV Vmax:       0.59 m/s AV Area (Vmean):   2.00 cm     PV Peak grad:  1.4 mmHg AV Area (VTI):     2.31 cm AV Vmax:           134.00 cm/s AV Vmean:          89.700  cm/s AV VTI:            0.201 m AV Peak Grad:      7.2 mmHg AV Mean Grad:      4.0 mmHg LVOT Vmax:         94.30 cm/s LVOT Vmean:        57.200 cm/s LVOT VTI:          0.148 m LVOT/AV VTI ratio: 0.74  AORTA Ao Root diam: 2.60 cm Ao Asc diam:  4.30 cm MITRAL VALVE MV Area (PHT): 1.68 cm    SHUNTS MV Area VTI:   2.20 cm    Systemic VTI:  0.15 m MV Peak grad:  3.4 mmHg    Systemic Diam: 2.00 cm MV Mean grad:  1.0 mmHg MV Vmax:       0.92 m/s MV Vmean:      51.8 cm/s MV Decel Time: 451 msec MV E velocity: 40.60 cm/s MV A velocity: 93.20 cm/s MV E/A ratio:  0.44 Dalton McleanMD Electronically signed by Wilfred Lacy Signature Date/Time: 11/09/2023/1:35:17 PM    Final    CT ABDOMEN PELVIS W CONTRAST Result Date: 11/08/2023 CLINICAL DATA:  Abdominal pain EXAM: CT ABDOMEN AND PELVIS WITH CONTRAST TECHNIQUE: Multidetector CT imaging of the abdomen and pelvis was performed using the standard protocol following bolus administration of intravenous contrast. RADIATION DOSE REDUCTION: This exam was performed according to the departmental dose-optimization program which includes automated exposure control, adjustment of the mA and/or kV according to patient size and/or use of iterative reconstruction technique. CONTRAST:  60mL OMNIPAQUE IOHEXOL 350 MG/ML  SOLN COMPARISON:  02/22/2023 FINDINGS: Lower chest: No acute abnormality Hepatobiliary: No focal hepatic abnormality. Gallbladder unremarkable. Pancreas: No focal abnormality or ductal dilatation. Spleen: No focal abnormality.  Normal size. Adrenals/Urinary Tract: Adrenal glands normal. Stable cyst off the upper pole of the right kidney appears simple. No follow-up imaging recommended. No stones or hydronephrosis. There is nodularity noted along the anterior bladder wall. This has a similar appearance to prior study. Appearance is concerning for possible bladder cancer. Several small bladder wall diverticula noted. Stomach/Bowel: Normal appendix. Stomach, large and small bowel grossly unremarkable. Vascular/Lymphatic: Aortoiliac atherosclerosis. No evidence of aneurysm or adenopathy. Reproductive: No visible focal abnormality. Other: No free fluid or free air. Musculoskeletal: No acute bony abnormality. IMPRESSION: Nodularity along the anterior bladder wall. Cannot exclude bladder cancer. Recommend urologic consultation. Aortoiliac atherosclerosis. Electronically Signed   By: Charlett Nose M.D.   On: 11/08/2023 23:50   CT Head Wo Contrast Result Date: 11/08/2023 CLINICAL DATA:  Altered level of consciousness, suspected sepsis EXAM: CT HEAD WITHOUT CONTRAST TECHNIQUE: Contiguous axial images were obtained from the base of the skull through the vertex without intravenous contrast. RADIATION DOSE REDUCTION: This exam was performed according to the departmental dose-optimization program which includes automated exposure control, adjustment of the mA and/or kV according to patient size and/or use of iterative reconstruction technique. COMPARISON:  01/03/2023 FINDINGS: Brain: No acute infarct or hemorrhage. Stable hypodensities within the periventricular white matter compatible with chronic small vessel ischemic changes. Lateral ventricles and midline structures are unremarkable. No acute extra-axial fluid collections.  No mass effect. Vascular: No hyperdense vessel or unexpected calcification. Skull: Normal. Negative for fracture or focal lesion. Sinuses/Orbits: No acute finding. Other: None. IMPRESSION: 1. Stable head CT, no acute intracranial process. Electronically Signed   By: Sharlet Salina M.D.   On: 11/08/2023 23:45   DG Chest 2 View Result Date: 11/08/2023 CLINICAL DATA:  Suspected sepsis EXAM:  CHEST - 2 VIEW COMPARISON:  CT and radiograph 02/22/2023 FINDINGS: Stable cardiomediastinal silhouette. Loop recorder. Scarring in the left mid lung laterally. No pleural effusion or pneumothorax. Shrapnel overlying the left upper chest/axilla. IMPRESSION: No active cardiopulmonary disease. Electronically Signed   By: Minerva Fester M.D.   On: 11/08/2023 21:10      Joycelyn Das, MD  [facility] 11/10/2023  If 7PM-7AM, please contact night-coverage

## 2023-11-10 NOTE — Progress Notes (Signed)
 Patient with back to back hypoglycemic events; first on arrival to unit from ED at 11:43, then again at 12:10.  Patient's CBG was 67 and received of ginger ale, then 44 and received additional of ginger ale and half a mango ice; refused additional.  Dr. Tyson Babinski made aware by secure message.  Anticipate orders.  Will continue to monitor closely.

## 2023-11-11 DIAGNOSIS — J101 Influenza due to other identified influenza virus with other respiratory manifestations: Secondary | ICD-10-CM | POA: Diagnosis not present

## 2023-11-11 LAB — CBC
HCT: 33.9 % — ABNORMAL LOW (ref 39.0–52.0)
Hemoglobin: 11 g/dL — ABNORMAL LOW (ref 13.0–17.0)
MCH: 27.4 pg (ref 26.0–34.0)
MCHC: 32.4 g/dL (ref 30.0–36.0)
MCV: 84.5 fL (ref 80.0–100.0)
Platelets: 157 10*3/uL (ref 150–400)
RBC: 4.01 MIL/uL — ABNORMAL LOW (ref 4.22–5.81)
RDW: 14.9 % (ref 11.5–15.5)
WBC: 5.3 10*3/uL (ref 4.0–10.5)
nRBC: 0 % (ref 0.0–0.2)

## 2023-11-11 LAB — URINE CULTURE: Culture: 40000 — AB

## 2023-11-11 LAB — BASIC METABOLIC PANEL
Anion gap: 7 (ref 5–15)
BUN: 35 mg/dL — ABNORMAL HIGH (ref 8–23)
CO2: 21 mmol/L — ABNORMAL LOW (ref 22–32)
Calcium: 8 mg/dL — ABNORMAL LOW (ref 8.9–10.3)
Chloride: 105 mmol/L (ref 98–111)
Creatinine, Ser: 1.67 mg/dL — ABNORMAL HIGH (ref 0.61–1.24)
GFR, Estimated: 41 mL/min — ABNORMAL LOW (ref >60)
Glucose, Bld: 105 mg/dL — ABNORMAL HIGH (ref 70–99)
Potassium: 3.6 mmol/L (ref 3.5–5.1)
Sodium: 133 mmol/L — ABNORMAL LOW (ref 135–145)

## 2023-11-11 LAB — GLUCOSE, CAPILLARY
Glucose-Capillary: 95 mg/dL (ref 70–99)
Glucose-Capillary: 110 mg/dL — ABNORMAL HIGH (ref 70–99)
Glucose-Capillary: 116 mg/dL — ABNORMAL HIGH (ref 70–99)
Glucose-Capillary: 161 mg/dL — ABNORMAL HIGH (ref 70–99)

## 2023-11-11 LAB — MAGNESIUM: Magnesium: 1.7 mg/dL (ref 1.7–2.4)

## 2023-11-11 MED ORDER — AMOXICILLIN 500 MG PO CAPS
500.0000 mg | ORAL_CAPSULE | Freq: Two times a day (BID) | ORAL | Status: DC
  Administered 2023-11-11 – 2023-11-14 (×7): 500 mg via ORAL
  Filled 2023-11-11 (×7): qty 1

## 2023-11-11 NOTE — Plan of Care (Signed)

## 2023-11-11 NOTE — Progress Notes (Addendum)
 PROGRESS NOTE  Luis Carter XBJ:[id number] DOB: 1946-01-15 DOA: 11/08/2023 PCP: Fleet Contras, MD   LOS: 2 days   Brief narrative:  Luis Carter is a 78 y.o. male with medical history significant of pretension, CKD stage IIIb, asthma COPD, congestive heart failure, hyperlipidemia, type 2 diabetes, gout, remote history of seizure disorder presented to the hospital with confusion for 1 day.  Has not been feeling well for a week.  Unable to describe further symptoms by the patient.  In the ED, patient was slightly tachycardic.  Labs were notable for creatinine of 2.0 with AST elevated at 104.  Chest x-ray showed no acute pulmonary disease.  Respiratory viral panel wa for influenza.  Urinalysis showed white cells 11-20 with bacteria.  CK level was elevated at 3480.  EKG was nonspecific.  CT head was negative for acute findings.  CT scan of the abdomen and pelvis showed nodularity along the anterior bladder wall could not exclude bladder cancer.  Patient was given Rocephin IV and was considered for admission to the hospital for further evaluation and treatment    Assessment/Plan: Principal Problem:   Influenza A Active Problems:   Stage 3b chronic kidney disease (CKD) (HCC)   Hypertension   UTI (urinary tract infection)   HLD (hyperlipidemia)  Influenza A No hypoxia.  Continue Tamiflu to complete the course.   Mild Rhabdomyolysis Received fluids.  CK level on presentation at 3480.  Improved.    Enterococcus UTI Abnormal urinalysis altered mental status.  On IV Rocephin.Urine with  Enterococcus faecalis.  Will discontinue Rocephin.  Change to amoxicillin.  Acute metabolic encephalopathy due to influenza and UTI. Continue supportive care.  Delirium precautions- significantly improved.   Possible AKI on CKD stage IIIb. Will continue to monitor creatinine levels.  IV fluids.  Creatinine today at 1.6.  Likely baseline creatinine between 1.3-1.7.   Elevated and flat  troponins. -Nonspecific.  No chest pain or EKG changes.  Echocardiogram with LV ejection fraction of 60 to 65% with grade 1 diastolic dysfunction with no regional wall motion abnormality.    Urinary Bladder nodularity. Spoke with [facility] for outpatient appointment.  Appointment has been made for March 26 at 9:30 AM.   HTN Elevated blood pressure at this time.  Lisinopril at home.  Will hold for now.  Resume oral hydralazine from home.  Monitor blood pressure.    Asthma/COPD Continue Advair as needed nebs.  Appears to be stable.    Chronic diastolic heart failure. Check 2D echocardiogram.  Compensated at this time.  Hold lisinopril for now.  Latest creatinine of 1.6   Hyperlipidemia -continue atorvastatin    Diabetes type II. Patient is on metformin at home.  Will hold.  Continue sliding scale insulin, diabetic diet.  Latest POC glucose of 95.   Gout -continue allopurinol  DVT prophylaxis: heparin injection 5,000 Units Start: 11/09/23 0600   Disposition: Skilled nursing facility as per PT recommendation.  Medically stable for disposition.  Status is: Inpatient  Remains inpatient appropriate because: Requiring rehabilitation.  Medically stable for disposition.  Code Status:     Code Status: Full Code  Family Communication: Spoke with the patient's sister on the phone on 11/10/2023.  Consultants: Outpatient urology referral.  Procedures: None  Anti-infectives:  Tamiflu, amoxicillin.  Anti-infectives (From admission, onward)    Start     Dose/Rate Route Frequency Ordered Stop   11/11/23 0000  oseltamivir (TAMIFLU) 30 MG capsule        30 mg Oral Daily  11/10/23 1013 11/15/23 2359             11/09/23 0215  oseltamivir (TAMIFLU) capsule 30 mg        30 mg Oral Daily 11/09/23 0147 11/14/23 0959   11/09/23 0200  cefTRIAXone (ROCEPHIN) 1 g in sodium chloride 0.9 % 100 mL IVPB        1 g 200 mL/hr over 30 Minutes Intravenous Every 24 hours 11/09/23 0147          Subjective:  Today, patient was seen and examined at bedside.  Patient states that he does have some weakness and has low appetite.  Has mild cough.  Denies any overt dyspnea nausea vomiting Objective: Vitals:   11/11/23 0610 11/11/23 0742  BP: (!) 132/59 124/66  Pulse:  91  Resp:  20  Temp:  98.5 F (36.9 C)  SpO2:  99%    Intake/Output Summary (Last 24 hours) at 11/11/2023 1041 Last data filed at 11/11/2023 0923 Gross per 24 hour  Intake 841.17 ml  Output 600 ml  Net 241.17 ml   Filed Weights   11/08/23 2010  Weight: 81.6 kg   Body mass index is 31.87 kg/m.   Physical Exam:  GENERAL: Patient is alert awake and oriented. Not in obvious distress.  More alert awake and communicative today.  Speech  distinct.  Elderly male.  Obese built HENT: No scleral pallor or icterus. Pupils equally reactive to light. Oral mucosa is moist NECK: is supple, no gross swelling noted. CHEST: Clear to auscultation. No crackles or wheezes.  Diminished breath sounds bilaterally. CVS: S1 and S2 heard, no murmur. Regular rate and rhythm.  ABDOMEN: Soft, non-tender, bowel sounds are present. EXTREMITIES: No edema. CNS: Cranial nerves are intact. No focal motor deficits.   SKIN: warm and dry without rashes.  Data Review: I have personally reviewed the following laboratory data and studies,  CBC: Recent Labs  Lab 11/08/23 2030 11/09/23 0513 11/10/23 0530 11/11/23 0353  WBC 6.2 6.0 5.2 5.3  NEUTROABS 3.9  --   --   --   HGB 13.3 11.8* 11.8* 11.0*  HCT 42.1 39.2 39.5 33.9*  MCV 85.4 89.7 90.8 84.5  PLT 204 187 152 157   Basic Metabolic Panel: Recent Labs  Lab 11/08/23 2030 11/09/23 0513 11/10/23 0530 11/11/23 0353  NA 137 136 137 133*  K 4.7 4.4 4.2 3.6  CL 104 106 109 105  CO2 22 15* 16* 21*  GLUCOSE 99 81 87 105*  BUN 34* 35* 36* 35*  CREATININE 2.01* 2.02* 1.67* 1.67*  CALCIUM 9.1 8.0* 7.7* 8.0*  MG  --   --  1.6* 1.7   Liver Function Tests: Recent Labs  Lab  11/08/23 2030 11/09/23 0513  AST 104* 92*  ALT 29 26  ALKPHOS 86 68  BILITOT 0.9 0.8  PROT 8.3* 7.1  ALBUMIN 4.0 3.4*   Recent Labs  Lab 11/08/23 2200  LIPASE 53*   No results for input(s): "AMMONIA" in the last 168 hours. Cardiac Enzymes: Recent Labs  Lab 11/08/23 2200 11/10/23 0530  CKTOTAL 3,480* 1,692*   BNP (last 3 results) Recent Labs    01/03/23 1505 02/22/23 2156  BNP 381.4* 128.9*    ProBNP (last 3 results) No results for input(s): "PROBNP" in the last 8760 hours.  CBG: Recent Labs  Lab 11/10/23 1210 11/10/23 1234 11/10/23 1637 11/10/23 2057 11/11/23 0741  GLUCAP 44* 101* 104* 86 95   Recent Results (from the past 240 hours)  Culture,  blood (Routine x 2)     Status: None (Preliminary result)   Collection Time: 11/08/23  8:30 PM   Specimen: BLOOD  Result Value Ref Range Status   Specimen Description BLOOD RIGHT ANTECUBITAL  Final   Special Requests   Final    BOTTLES DRAWN AEROBIC AND ANAEROBIC Blood Culture adequate volume   Culture   Final    NO GROWTH 3 DAYS Performed at [facility], [address] [place], [place] [address]    Report Status PENDING  Incomplete  Culture, blood (Routine x 2)     Status: None (Preliminary result)   Collection Time: 11/08/23  8:32 PM   Specimen: BLOOD  Result Value Ref Range Status   Specimen Description BLOOD LEFT ANTECUBITAL  Final   Special Requests   Final    BOTTLES DRAWN AEROBIC AND ANAEROBIC Blood Culture adequate volume   Culture   Final    NO GROWTH 3 DAYS Performed at [facility], [address], [place], [place] [address]    Report Status PENDING  Incomplete  Resp panel by RT-PCR (RSV, Flu A&B, Covid) Anterior Nasal Swab     Status: Abnormal   Collection Time: 11/08/23  9:46 PM   Specimen: Anterior Nasal Swab  Result Value Ref Range Status   SARS Coronavirus 2 by RT PCR NEGATIVE NEGATIVE Final   Influenza A by PCR POSITIVE (A) NEGATIVE Final   Influenza B by PCR NEGATIVE  NEGATIVE Final    Comment: (NOTE) The Xpert Xpress SARS-CoV-2/FLU/RSV plus assay is intended as an aid in the diagnosis of influenza from Nasopharyngeal swab specimens and should not be used as a sole basis for treatment. Nasal washings and aspirates are unacceptable for Xpert Xpress SARS-CoV-2/FLU/RSV testing.  Fact Sheet for Patients: BloggerCourse.com  Fact Sheet for Healthcare Providers: SeriousBroker.it  This test is not yet approved or cleared by the [place] FDA and has been authorized for detection and/or diagnosis of SARS-CoV-2 by FDA under an Emergency Use Authorization (EUA). This EUA will remain in effect (meaning this test can be used) for the duration of the COVID-19 declaration under Section 564(b)(1) of the Act, 21 U.S.C. section 360bbb-3(b)(1), unless the authorization is terminated or revoked.     Resp Syncytial Virus by PCR NEGATIVE NEGATIVE Final    Comment: (NOTE) Fact Sheet for Patients: BloggerCourse.com  Fact Sheet for Healthcare Providers: SeriousBroker.it  This test is not yet approved or cleared by the [place] FDA and has been authorized for detection and/or diagnosis of SARS-CoV-2 by FDA under an Emergency Use Authorization (EUA). This EUA will remain in effect (meaning this test can be used) for the duration of the COVID-19 declaration under Section 564(b)(1) of the Act, 21 U.S.C. section 360bbb-3(b)(1), unless the authorization is terminated or revoked.  Performed at [facility], [address] [place], [place] [address]   Urine Culture     Status: Abnormal   Collection Time: 11/09/23 12:13 AM   Specimen: Urine, Random  Result Value Ref Range Status   Specimen Description URINE, RANDOM  Final   Special Requests   Final    NONE Reflexed from [id number] Performed at [facility], [address] [place], [place] [address]     Culture 40,000 COLONIES/mL ENTEROCOCCUS FAECALIS (A)  Final   Report Status 11/11/2023 FINAL  Final   Organism ID, Bacteria ENTEROCOCCUS FAECALIS (A)  Final      Susceptibility   Enterococcus faecalis - MIC*  AMPICILLIN <=2 SENSITIVE Sensitive     NITROFURANTOIN <=16 SENSITIVE Sensitive     VANCOMYCIN 1 SENSITIVE Sensitive     * 40,000 COLONIES/mL ENTEROCOCCUS FAECALIS     Studies: ECHOCARDIOGRAM COMPLETE Result Date: 11/09/2023    ECHOCARDIOGRAM REPORT   Patient Name:   Luis Carter Date of Exam: 11/09/2023 Medical Rec #:  [id number]          Height:       63.0 in Accession #:    [id number]         Weight:       179.9 lb Date of Birth:  February 28, 1946           BSA:          1.849 m Patient Age:    78 years           BP:           156/81 mmHg Patient Gender: M                  HR:           78 bpm. Exam Location:  Inpatient Procedure: 2D Echo, 3D Echo, Cardiac Doppler, Color Doppler and Strain Analysis            (Both Spectral and Color Flow Doppler were utilized during            procedure). Indications:    Fever, elevated troponin  History:        Patient has prior history of Echocardiogram examinations, most                 recent 01/04/2023. Cardiomyopathy, COPD, Arrythmias:Bradycardia,                 Signs/Symptoms:Chest Pain; Risk Factors:Dyslipidemia, Diabetes,                 Former Smoker and Hypertension. CKD stage 3.  Sonographer:    Vern Claude Referring Phys: [id number] Kharis Lapenna IMPRESSIONS  1. Left ventricular ejection fraction, by estimation, is 60 to 65%. Left ventricular ejection fraction by 3D volume is 58 %. The left ventricle has normal function. The left ventricle has no regional wall motion abnormalities. Left ventricular diastolic  parameters are consistent with Grade I diastolic dysfunction (impaired relaxation). The average left ventricular global longitudinal strain is -17.0 %. The global longitudinal strain is abnormal.  2. Right ventricular systolic function is  normal. The right ventricular size is normal. Tricuspid regurgitation signal is inadequate for assessing PA pressure.  3. The mitral valve is normal in structure. No evidence of mitral valve regurgitation. No evidence of mitral stenosis.  4. The aortic valve is tricuspid. There is moderate calcification of the aortic valve. Aortic valve regurgitation is not visualized. Aortic valve sclerosis/calcification is present, without any evidence of aortic stenosis.  5. Aortic dilatation noted. There is mild dilatation of the ascending aorta, measuring 43 mm.  6. The inferior vena cava is normal in size with greater than 50% respiratory variability, suggesting right atrial pressure of 3 mmHg. FINDINGS  Left Ventricle: Left ventricular ejection fraction, by estimation, is 60 to 65%. Left ventricular ejection fraction by 3D volume is 58 %. The left ventricle has normal function. The left ventricle has no regional wall motion abnormalities. The average left ventricular global longitudinal strain is -17.0 %. Strain was performed and the global longitudinal strain is abnormal. The left ventricular internal cavity size was normal in size. There is no left ventricular hypertrophy.  Left ventricular diastolic parameters are consistent with Grade I diastolic dysfunction (impaired relaxation). Right Ventricle: The right ventricular size is normal. No increase in right ventricular wall thickness. Right ventricular systolic function is normal. Tricuspid regurgitation signal is inadequate for assessing PA pressure. Left Atrium: Left atrial size was normal in size. Right Atrium: Right atrial size was normal in size. Pericardium: There is no evidence of pericardial effusion. Mitral Valve: The mitral valve is normal in structure. There is mild calcification of the mitral valve leaflet(s). No evidence of mitral valve regurgitation. No evidence of mitral valve stenosis. MV peak gradient, 3.4 mmHg. The mean mitral valve gradient  is 1.0 mmHg.  Tricuspid Valve: The tricuspid valve is normal in structure. Tricuspid valve regurgitation is not demonstrated. Aortic Valve: The aortic valve is tricuspid. There is moderate calcification of the aortic valve. Aortic valve regurgitation is not visualized. Aortic valve sclerosis/calcification is present, without any evidence of aortic stenosis. Aortic valve mean gradient measures 4.0 mmHg. Aortic valve peak gradient measures 7.2 mmHg. Aortic valve area, by VTI measures 2.31 cm. Pulmonic Valve: The pulmonic valve was normal in structure. Pulmonic valve regurgitation is not visualized. Aorta: The aortic root is normal in size and structure and aortic dilatation noted. There is mild dilatation of the ascending aorta, measuring 43 mm. Venous: The inferior vena cava is normal in size with greater than 50% respiratory variability, suggesting right atrial pressure of 3 mmHg. IAS/Shunts: No atrial level shunt detected by color flow Doppler. Additional Comments: 3D was performed not requiring image post processing on an independent workstation and was normal.  LEFT VENTRICLE PLAX 2D LVIDd:         4.10 cm         Diastology LVIDs:         2.80 cm         LV e' medial:    6.09 cm/s LV PW:         0.60 cm         LV E/e' medial:  6.7 LV IVS:        0.90 cm         LV e' lateral:   8.27 cm/s LVOT diam:     2.00 cm         LV E/e' lateral: 4.9 LV SV:         46 LV SV Index:   25              2D Longitudinal LVOT Area:     3.14 cm        Strain                                2D Strain GLS   -17.0 %                                Avg: LV Volumes (MOD) LV vol d, MOD    93.5 ml       3D Volume EF A2C:                           LV 3D EF:    Left LV vol d, MOD    105.0 ml                   ventricul A4C:  ar LV vol s, MOD    27.1 ml                    ejection A2C:                                        fraction LV vol s, MOD    40.2 ml                    by 3D A4C:                                         volume is LV SV MOD A2C:   66.4 ml                    58 %. LV SV MOD A4C:   105.0 ml LV SV MOD BP:    67.4 ml                                3D Volume EF:                                3D EF:        58 %                                LV EDV:       199 ml                                LV ESV:       83 ml                                LV SV:        115 ml RIGHT VENTRICLE             IVC RV Basal diam:  3.20 cm     IVC diam: 0.90 cm RV Mid diam:    2.60 cm RV S prime:     14.40 cm/s TAPSE (M-mode): 2.1 cm LEFT ATRIUM             Index        RIGHT ATRIUM          Index LA diam:        2.40 cm 1.30 cm/m   RA Area:     9.83 cm LA Vol (A2C):   19.1 ml 10.33 ml/m  RA Volume:   17.60 ml 9.52 ml/m LA Vol (A4C):   21.3 ml 11.52 ml/m LA Biplane Vol: 20.4 ml 11.04 ml/m  AORTIC VALVE                    PULMONIC VALVE AV Area (Vmax):    2.21 cm     PV Vmax:       0.59 m/s AV Area (Vmean):   2.00 cm     PV Peak grad:  1.4 mmHg AV Area (VTI):  2.31 cm AV Vmax:           134.00 cm/s AV Vmean:          89.700 cm/s AV VTI:            0.201 m AV Peak Grad:      7.2 mmHg AV Mean Grad:      4.0 mmHg LVOT Vmax:         94.30 cm/s LVOT Vmean:        57.200 cm/s LVOT VTI:          0.148 m LVOT/AV VTI ratio: 0.74  AORTA Ao Root diam: 2.60 cm Ao Asc diam:  4.30 cm MITRAL VALVE MV Area (PHT): 1.68 cm    SHUNTS MV Area VTI:   2.20 cm    Systemic VTI:  0.15 m MV Peak grad:  3.4 mmHg    Systemic Diam: 2.00 cm MV Mean grad:  1.0 mmHg MV Vmax:       0.92 m/s MV Vmean:      51.8 cm/s MV Decel Time: 451 msec MV E velocity: 40.60 cm/s MV A velocity: 93.20 cm/s MV E/A ratio:  0.44 Dalton McleanMD Electronically signed by Wilfred Lacy Signature Date/Time: 11/09/2023/1:35:17 PM    Final       Joycelyn Das, MD  [facility] 11/11/2023  If 7PM-7AM, please contact night-coverage

## 2023-11-11 NOTE — Evaluation (Signed)
 Occupational Therapy Evaluation Patient Details Name: Luis Carter MRN: [id number] DOB: 1946-02-21 Today's Date: 11/11/2023   History of Present Illness   Pt is an 78 y.o. male who presented 11/08/23 with confusion. Admitted with mild rhabdomyolysis and acute metabolic encephalopathy due to influenza A and UTI. PMH: CKD 3B, seizures, HTN, HLD, gout, low back pain, DM, COPD, MI, diastolic CHF, asthma     Clinical Impressions Pt at this time reported that they live with sister and normally uses a SPC to ambulate. Pt at this time required CGA for bed mobility, CGA to min assist for transfers and step pivoted to chair with min assist. Pt then reported on how they will need assist with self feeding but then started to feed self. Patient will benefit from continued inpatient follow up therapy, <3 hours/day.      If plan is discharge home, recommend the following:   A lot of help with walking and/or transfers;A lot of help with bathing/dressing/bathroom;Assistance with cooking/housework;Direct supervision/assist for medications management;Direct supervision/assist for financial management;Assist for transportation;Supervision due to cognitive status;Help with stairs or ramp for entrance     Functional Status Assessment   Patient has had a recent decline in their functional status and demonstrates the ability to make significant improvements in function in a reasonable and predictable amount of time.     Equipment Recommendations    (TBD)     Recommendations for Other Services         Precautions/Restrictions   Precautions Precautions: Fall Restrictions Weight Bearing Restrictions Per Provider Order: No     Mobility Bed Mobility Overal bed mobility: Needs Assistance Bed Mobility: Supine to Sit, Sit to Supine     Supine to sit: Contact guard Sit to supine: Contact guard assist   General bed mobility comments: Pt would lay back down in the session but does not  realize half off the bed    Transfers Overall transfer level: Needs assistance Equipment used: Rolling walker (2 wheels) Transfers: Sit to/from Stand Sit to Stand: Contact guard assist, Min assist                  Balance Overall balance assessment: Needs assistance Sitting-balance support: Feet supported, Bilateral upper extremity supported Sitting balance-Leahy Scale: Fair Sitting balance - Comments: Pt would start to lay back into supine   Standing balance support: Bilateral upper extremity supported Standing balance-Leahy Scale: Poor Standing balance comment: requires RW at all time                           ADL either performed or assessed with clinical judgement   ADL Overall ADL's : Needs assistance/impaired Eating/Feeding: Set up;Sitting Eating/Feeding Details (indicate cue type and reason): Pt reported they needed help self feeding but then started to self feed Grooming: Wash/dry hands;Set up;Sitting   Upper Body Bathing: Contact guard assist;Sitting   Lower Body Bathing: Maximal assistance;Sit to/from stand   Upper Body Dressing : Contact guard assist;Sitting;Cueing for sequencing;Cueing for safety   Lower Body Dressing: Maximal assistance;Sit to/from stand   Toilet Transfer: Minimal assistance;Cueing for safety;Cueing for sequencing;Rolling walker (2 wheels)   Toileting- Clothing Manipulation and Hygiene: Maximal assistance;Sit to/from stand       Functional mobility during ADLs: Minimal assistance;Cueing for safety;Rolling walker (2 wheels);Cueing for sequencing       Vision Ability to See in Adequate Light: 0 Adequate Patient Visual Report: No change from baseline       Perception  Praxis Praxis: [facility]       Pertinent Vitals/Pain Pain Assessment Pain Assessment: Faces Faces Pain Scale: Hurts a little bit Pain Location: back Pain Descriptors / Indicators: Discomfort Pain Intervention(s): Limited activity within  patient's tolerance, Monitored during session     Extremity/Trunk Assessment Upper Extremity Assessment Upper Extremity Assessment: Generalized weakness;Right hand dominant;RUE deficits/detail;LUE deficits/detail RUE Deficits / Details: AROM less than 90 degrees shoulder flexion with abduction RUE Sensation: WNL RUE Coordination: decreased gross motor LUE Deficits / Details: AROM less than 90 degrees shoulder flexion with abduction LUE Sensation: WNL LUE Coordination: decreased gross motor   Lower Extremity Assessment Lower Extremity Assessment: Defer to PT evaluation   Cervical / Trunk Assessment Cervical / Trunk Assessment: Kyphotic   Communication Communication Communication: No apparent difficulties   Cognition Arousal: Alert Behavior During Therapy: Flat affect Cognition: Difficult to assess             OT - Cognition Comments: Pt reporting they need breathin tx but then stating in the 99-100% on RA and then reporting they need to eat but layed back down in the bed                 Following commands: Impaired Following commands impaired: Follows one step commands inconsistently     Cueing  General Comments   Cueing Techniques: Verbal cues;Tactile cues      Exercises     Shoulder Instructions      Home Living Family/patient expects to be discharged to:: Private residence Living Arrangements: Other relatives (sister) Available Help at Discharge: Family;Available 24 hours/day (sister is currently sick) Type of Home: House Home Access: Level entry     Home Layout: Two level;Able to live on main level with bedroom/bathroom;Other (Comment) (shower is on second level of the home, but also reports that they sponge bath) Alternate Level Stairs-Number of Steps: flight Alternate Level Stairs-Rails: Right Bathroom Shower/Tub: Sponge bathes at baseline   Bathroom Toilet: Standard     Home Equipment: Rollator (4 wheels);Cane - single point           Prior Functioning/Environment Prior Level of Function : Independent/Modified Independent             Mobility Comments: Mod I using SPC majority of time, intermittently using RW ADLs Comments: Sister does the driving, cooking, cleaning, shopping, and managing his meds and finances, but otherwise pt is mod I    OT Problem List: Decreased strength;Decreased activity tolerance;Impaired balance (sitting and/or standing);Decreased safety awareness;Decreased knowledge of use of DME or AE;Cardiopulmonary status limiting activity;Pain   OT Treatment/Interventions: Self-care/ADL training;Therapeutic exercise;DME and/or AE instruction;Therapeutic activities;Patient/family education;Balance training      OT Goals(Current goals can be found in the care plan section)   Acute Rehab OT Goals Patient Stated Goal: to eat OT Goal Formulation: With patient Time For Goal Achievement: 11/25/23 Potential to Achieve Goals: Good   OT Frequency:  Min 1X/week    Co-evaluation              AM-PAC OT "6 Clicks" Daily Activity     Outcome Measure Help from another person eating meals?: None Help from another person taking care of personal grooming?: A Little Help from another person toileting, which includes using toliet, bedpan, or urinal?: A Lot Help from another person bathing (including washing, rinsing, drying)?: A Lot Help from another person to put on and taking off regular upper body clothing?: A Little Help from another person to put on and taking off regular lower  body clothing?: A Lot 6 Click Score: 16   End of Session Equipment Utilized During Treatment: Gait belt;Rolling walker (2 wheels) Nurse Communication: Mobility status  Activity Tolerance: Patient limited by fatigue Patient left: in chair;with call bell/phone within reach;with chair alarm set  OT Visit Diagnosis: Unsteadiness on feet (R26.81);Other abnormalities of gait and mobility (R26.89);Muscle weakness (generalized)  (M62.81);Pain Pain - Right/Left:  (back)                Time: 1610-9604 OT Time Calculation (min): 23 min Charges:  OT General Charges $OT Visit: 1 Visit OT Evaluation $OT Eval Low Complexity: 1 Low OT Treatments $Self Care/Home Management : 8-22 mins  Presley Raddle OTR/L  Acute Rehab Services  [phone] office number   Alphia Moh 11/11/2023, 9:42 AM

## 2023-11-12 DIAGNOSIS — J101 Influenza due to other identified influenza virus with other respiratory manifestations: Secondary | ICD-10-CM | POA: Diagnosis not present

## 2023-11-12 LAB — GLUCOSE, CAPILLARY
Glucose-Capillary: 104 mg/dL — ABNORMAL HIGH (ref 70–99)
Glucose-Capillary: 144 mg/dL — ABNORMAL HIGH (ref 70–99)
Glucose-Capillary: 147 mg/dL — ABNORMAL HIGH (ref 70–99)
Glucose-Capillary: 164 mg/dL — ABNORMAL HIGH (ref 70–99)

## 2023-11-12 NOTE — Progress Notes (Signed)
 Physical Therapy Treatment Patient Details Name: Luis Carter MRN: [id number] DOB: Sep 11, 1945 Today's Date: 11/12/2023   History of Present Illness Pt is an 78 y.o. male who presented 11/08/23 with confusion. Admitted with mild rhabdomyolysis and acute metabolic encephalopathy due to influenza A and UTI. PMH: CKD 3B, seizures, HTN, HLD, gout, low back pain, DM, COPD, MI, diastolic CHF, asthma    PT Comments  Session focused on gait training to promote safe navigation with least restrictive AD. With the pt's decreased pain from PT eval, was able to walk around his hospital room with RW today with min A-CGA. Additionally, pt improved his bed mobility and is requiring less assistance to complete the movement. Pt still has difficulty with transfers, activity tolerance, balance, and gait and requires cueing for proper sequencing, hand placement, positioning, and some physical assistance when performing transfers. Pt would benefit from continued mobility with nurses and mobility specialists until d/c. Will continue to follow acutely.      If plan is discharge home, recommend the following: A little help with walking and/or transfers;A little help with bathing/dressing/bathroom;Assistance with cooking/housework;Assist for transportation;Help with stairs or ramp for entrance   Can travel by private vehicle     Yes  Equipment Recommendations  BSC/3in1    Recommendations for Other Services       Precautions / Restrictions Precautions Precautions: Fall Recall of Precautions/Restrictions: Intact Restrictions Weight Bearing Restrictions Per Provider Order: No     Mobility  Bed Mobility Overal bed mobility: Needs Assistance Bed Mobility: Sidelying to Sit   Sidelying to sit: Modified independent (Device/Increase time)       General bed mobility comments: pt mod I s/t increased time and elevated HOB.    Transfers Overall transfer level: Needs assistance Equipment used: Rolling walker  (2 wheels) Transfers: Sit to/from Stand Sit to Stand: Min assist           General transfer comment: pt min A to power up from lowest bed height and for hand placement cueing on RW vs seated surface. Used anterior momentum to stand up. After 1st sit to stand pt had to sit back down s/t cramping sensation in abdomen. needed min A for both attempts    Ambulation/Gait Ambulation/Gait assistance: Contact guard assist, Min assist Gait Distance (Feet): 14 Feet Assistive device: Rolling walker (2 wheels) Gait Pattern/deviations: Step-through pattern, Decreased stride length, Trunk flexed Gait velocity: reduced     General Gait Details: pt walks with increased trunk flexion and required min A cueing to maintain proximity to RW and keeping all 4 points of AD on the ground   Stairs             Wheelchair Mobility     Tilt Bed    Modified Rankin (Stroke Patients Only)       Balance Overall balance assessment: Needs assistance Sitting-balance support: No upper extremity supported, Feet supported Sitting balance-Leahy Scale: Good Sitting balance - Comments: pt sits with increased trunk flexion s/t abdominal cramping   Standing balance support: Bilateral upper extremity supported, During functional activity, Reliant on assistive device for balance Standing balance-Leahy Scale: Poor Standing balance comment: reliant on RW. stands with increased trunk flexion                            Communication Communication Communication: No apparent difficulties  Cognition Arousal: Alert Behavior During Therapy: WFL for tasks assessed/performed   PT - Cognitive impairments: No apparent impairments  Following commands: Intact      Cueing Cueing Techniques: Verbal cues  Exercises      General Comments        Pertinent Vitals/Pain Pain Assessment Pain Assessment: Faces Faces Pain Scale: Hurts a little bit Pain Location: R UE  and abdomen Pain Descriptors / Indicators: Cramping, Discomfort, Guarding, Grimacing Pain Intervention(s): Monitored during session    Home Living                          Prior Function            PT Goals (current goals can now be found in the care plan section) Acute Rehab PT Goals Patient Stated Goal: to reduce pain and get better PT Goal Formulation: With patient Time For Goal Achievement: 11/24/23 Potential to Achieve Goals: Good Progress towards PT goals: Progressing toward goals    Frequency    Min 2X/week      PT Plan      Co-evaluation              AM-PAC PT "6 Clicks" Mobility   Outcome Measure  Help needed turning from your back to your side while in a flat bed without using bedrails?: None Help needed moving from lying on your back to sitting on the side of a flat bed without using bedrails?: None Help needed moving to and from a bed to a chair (including a wheelchair)?: A Little Help needed standing up from a chair using your arms (e.g., wheelchair or bedside chair)?: A Little Help needed to walk in hospital room?: Total (< 20 ft) Help needed climbing 3-5 steps with a railing? : Total 6 Click Score: 16    End of Session Equipment Utilized During Treatment: Gait belt Activity Tolerance: Patient limited by fatigue Patient left: in chair;with call bell/phone within reach;with chair alarm set Nurse Communication: Mobility status;Other (comment) (pt status of abdominal and R UE cramping throughout the day) PT Visit Diagnosis: Unsteadiness on feet (R26.81);Other abnormalities of gait and mobility (R26.89);Muscle weakness (generalized) (M62.81);Difficulty in walking, not elsewhere classified (R26.2);Pain Pain - Right/Left: Right Pain - part of body: Arm (and abdomen)     Time: 4132-4401 PT Time Calculation (min) (ACUTE ONLY): 18 min  Charges:    $Gait Training: 8-22 mins PT General Charges $$ ACUTE PT VISIT: 1 Visit                      [address], SPT    [place] 11/12/2023, 5:49 PM

## 2023-11-12 NOTE — TOC Initial Note (Signed)
 Transition of Care [facility]) - Initial/Assessment Note    Patient Details  Name: Luis Carter MRN: [id number] Date of Birth: 07-27-1946  Transition of Care [facility]) CM/SW Contact:    [name], LCSW Phone Number: 11/12/2023, 5:06 PM  Clinical Narrative:                  CSW met with patient at bedside. CSW introduced self and explained role. CSW discussed with patient recommendations for STR. Patient states he lives with his sister. Patient states he is agreeable to rehab at [facility]. No preferred SNF at this time. ALL questions answered.   TOC will provide bed offers once available TOC will continue to follow and assist with discharge planning.   Antony Blackbird, MSW, LCSW Clinical Social Worker    Expected Discharge Plan: Skilled Nursing Facility Barriers to Discharge: Insurance Authorization, SNF Pending bed offer   Patient Goals and CMS Choice            Expected Discharge Plan and Services In-house Referral: Clinical Social Work     Living arrangements for the past 2 months: Single Family Home Expected Discharge Date: 11/10/23                                    Prior Living Arrangements/Services Living arrangements for the past 2 months: Single Family Home Lives with:: Self, Siblings Patient language and need for interpreter reviewed:: No        Need for Family Participation in Patient Care: Yes (Comment) Care giver support system in place?: Yes (comment)   Criminal Activity/Legal Involvement Pertinent to Current Situation/Hospitalization: No - Comment as needed  Activities of Daily Living   ADL Screening (condition at time of admission) Independently performs ADLs?: No Does the patient have a NEW difficulty with bathing/dressing/toileting/self-feeding that is expected to last >3 days?: No (needs assist) Does the patient have a NEW difficulty with getting in/out of bed, walking, or climbing stairs that is expected to last >3 days?: No (needs  assist) Does the patient have a NEW difficulty with communication that is expected to last >3 days?: No Is the patient deaf or have difficulty hearing?: No Does the patient have difficulty seeing, even when wearing glasses/contacts?: No Does the patient have difficulty concentrating, remembering, or making decisions?: Yes  Permission Sought/Granted Permission sought to share information with : Family Supports Permission granted to share information with : Yes, Verbal Permission Granted  Share Information with NAME: Luis Carter  Permission granted to share info w AGENCY: SNFs  Permission granted to share info w Relationship: sister  Permission granted to share info w Contact Information: [phone]  Emotional Assessment Appearance:: Appears stated age Attitude/Demeanor/Rapport: Engaged Affect (typically observed): Accepting, Appropriate Orientation: : Oriented to Self, Oriented to Place, Oriented to  Time, Oriented to Situation Alcohol / Substance Use: Not Applicable Psych Involvement: No (comment)  Admission diagnosis:  Influenza A [J10.1] Patient Active Problem List   Diagnosis Date Noted   Influenza A 11/09/2023   Sinus bradycardia 02/20/2023   Bradycardia 02/20/2023   UTI (urinary tract infection) 01/07/2023   Acute on chronic diastolic CHF (congestive heart failure) (HCC) 01/03/2023   Acute pyelonephritis 03/18/2022   Elevated troponin 03/18/2022   Chronic diastolic CHF (congestive heart failure) (HCC) 03/18/2022   Physical debility 09/17/2021   Acute right hip pain 09/16/2021   HLD (hyperlipidemia) 09/15/2021   COVID-19 virus infection 09/15/2021   SIRS (  systemic inflammatory response syndrome) (HCC) 03/11/2021   Malnutrition of moderate degree 02/11/2021   Hyperkalemia 02/10/2021   Dyspnea 02/08/2021   Community acquired pneumonia of right upper lobe of lung    Respiratory failure with hypoxia and hypercapnia (HCC) 10/11/2018   Acute pain of left shoulder  10/15/2016   Pain in right leg 10/15/2016   Weakness of right leg 10/15/2016   Cardiomyopathy (HCC) 01/25/2016   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   Abnormal echocardiogram 12/13/2015   Near syncope 12/12/2015   Tobacco use disorder 09/07/2015   Gout 01/27/2012   Cocaine use 12/22/2011   Chest pain 12/18/2008   Type 2 diabetes mellitus with hyperlipidemia (HCC) 04/14/2007   Hypertension 04/14/2007   Asthma 04/14/2007   COPD (chronic obstructive pulmonary disease) (HCC) 04/14/2007   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   Seizures (HCC) 04/14/2007   History of digestive system disease 04/14/2007   PCP:  Fleet Contras, MD Pharmacy:   [id number] [place], [place] - [address] AT [facility] & [address] [place] [address] Phone: [phone] Fax: [phone]     Social Drivers of Health (SDOH) Social History: SDOH Screenings   Food Insecurity: No Food Insecurity (11/10/2023)  Housing: Low Risk  (11/10/2023)  Transportation Needs: No Transportation Needs (11/10/2023)  Utilities: Not At Risk (11/10/2023)  Social Connections: Socially Isolated (11/10/2023)  Tobacco Use: Medium Risk (11/08/2023)   SDOH Interventions:     Readmission Risk Interventions    09/19/2021    2:31 PM  Readmission Risk Prevention Plan  Transportation Screening Complete  PCP or Specialist Appt within 3-5 Days Complete  HRI or Home Care Consult Complete  Social Work Consult for Recovery Care Planning/Counseling Patient refused  Palliative Care Screening Not Applicable  Medication Review Oceanographer) Complete

## 2023-11-12 NOTE — Progress Notes (Signed)
 PROGRESS NOTE  KASEM MOZER GGY:[id number] DOB: 09-20-1945 DOA: 11/08/2023 PCP: Fleet Contras, MD   LOS: 3 days   Brief narrative:  Luis Carter is a 78 y.o. male with medical history significant of pretension, CKD stage IIIb, asthma COPD, congestive heart failure, hyperlipidemia, type 2 diabetes, gout, remote history of seizure disorder presented to the hospital with confusion for 1 day.  Has not been feeling well for a week.  Unable to describe further symptoms by the patient.  In the ED, patient was slightly tachycardic.  Labs were notable for creatinine of 2.0 with AST elevated at 104.  Chest x-ray showed no acute pulmonary disease.  Respiratory viral panel wa for influenza.  Urinalysis showed white cells 11-20 with bacteria.  CK level was elevated at 3480.  EKG was nonspecific.  CT head was negative for acute findings.  CT scan of the abdomen and pelvis showed nodularity along the anterior bladder wall could not exclude bladder cancer.  Patient was given Rocephin IV and was considered for admission to the hospital for further evaluation and treatment    Assessment/Plan: Principal Problem:   Influenza A Active Problems:   Stage 3b chronic kidney disease (CKD) (HCC)   Hypertension   UTI (urinary tract infection)   HLD (hyperlipidemia)  Influenza A No hypoxia.  Continue Tamiflu to complete the course.  Mild cough.   Mild Rhabdomyolysis Received fluids.  CK level on presentation at 3480.  Improved.    Enterococcus UTI Abnormal urinalysis altered mental status.  Urine culture showed Enterococcus faecalis.  Was on IV Rocephin which has been changed to amoxicillin.  Acute metabolic encephalopathy due to influenza and UTI. Continue supportive care.  Delirium precautions- significantly improved.  Currently at baseline.   Possible AKI on CKD stage IIIb. Will continue to monitor creatinine levels.   Creatinine today at 1.6.  Likely baseline creatinine between 1.3-1.7.    Elevated and flat troponins. -Nonspecific.  No chest pain or EKG changes.  2D echocardiogram with LV ejection fraction of 60 to 65% with grade 1 diastolic dysfunction with no regional wall motion abnormality.    Urinary Bladder nodularity. Spoke with [facility] for outpatient appointment.  Appointment has been made for March 26 at 9:30 AM.   HTN Elevated blood pressure at this time.  Lisinopril at home.  Will hold for now.  Continue oral hydralazine.  Continue to monitor blood pressure.    Asthma/COPD Continue Advair as needed nebs.  Appears to be stable.    Chronic diastolic heart failure. Check 2D echocardiogram.  Compensated at this time.  Hold lisinopril for now.  Latest creatinine of 1.6   Hyperlipidemia -continue atorvastatin    Diabetes type II. Patient is on metformin at home. Continue sliding scale insulin, diabetic diet.  Latest POC glucose of 114.   Gout -continue allopurinol  DVT prophylaxis: heparin injection 5,000 Units Start: 11/09/23 0600   Disposition: Skilled nursing facility as per PT recommendation.  Medically stable for disposition.  Status is: Inpatient  Remains inpatient appropriate because: Requiring rehabilitation.  Medically stable for disposition.  Code Status:     Code Status: Full Code  Family Communication: Spoke with the patient's sister on the phone on 11/10/2023.  Consultants: Outpatient urology referral.  Procedures: None  Anti-infectives:  Tamiflu, amoxicillin.  Anti-infectives (From admission, onward)    Start     Dose/Rate Route Frequency Ordered Stop   11/11/23 0000  oseltamivir (TAMIFLU) 30 MG capsule        30  mg Oral Daily 11/10/23 1013 11/15/23 2359             11/09/23 0215  oseltamivir (TAMIFLU) capsule 30 mg        30 mg Oral Daily 11/09/23 0147 11/14/23 0959   11/09/23 0200  cefTRIAXone (ROCEPHIN) 1 g in sodium chloride 0.9 % 100 mL IVPB        1 g 200 mL/hr over 30 Minutes Intravenous Every 24 hours  11/09/23 0147         Subjective:  Today, patient was seen and examined at bedside.  Patient alert awake and Communicative.  Complains of mild neck discomfort due to position.  Has mild cough but no other issues.  Objective: Vitals:   11/12/23 0525 11/12/23 1128  BP: 133/61 115/68  Pulse: 84 81  Resp: 16 18  Temp: 98.1 F (36.7 C) 98.3 F (36.8 C)  SpO2: 100% 97%    Intake/Output Summary (Last 24 hours) at 11/12/2023 1307 Last data filed at 11/12/2023 1133 Gross per 24 hour  Intake 358 ml  Output 500 ml  Net -142 ml   Filed Weights   11/08/23 2010  Weight: 81.6 kg   Body mass index is 31.87 kg/m.   Physical Exam:  GENERAL: Patient is alert awake and oriented. Not in obvious distress.  More alert awake and communicative today.  Speech  distinct.  Elderly male.  Obese built HENT: No scleral pallor or icterus. Pupils equally reactive to light. Oral mucosa is moist, mild neck discomfort today. NECK: is supple, no gross swelling noted. CHEST: Clear to auscultation. No crackles or wheezes.   CVS: S1 and S2 heard, no murmur. Regular rate and rhythm.  ABDOMEN: Soft, non-tender, bowel sounds are present. EXTREMITIES: No edema. CNS: Cranial nerves are intact. No focal motor deficits.   SKIN: warm and dry without rashes.  Data Review: I have personally reviewed the following laboratory data and studies,  CBC: Recent Labs  Lab 11/08/23 2030 11/09/23 0513 11/10/23 0530 11/11/23 0353  WBC 6.2 6.0 5.2 5.3  NEUTROABS 3.9  --   --   --   HGB 13.3 11.8* 11.8* 11.0*  HCT 42.1 39.2 39.5 33.9*  MCV 85.4 89.7 90.8 84.5  PLT 204 187 152 157   Basic Metabolic Panel: Recent Labs  Lab 11/08/23 2030 11/09/23 0513 11/10/23 0530 11/11/23 0353  NA 137 136 137 133*  K 4.7 4.4 4.2 3.6  CL 104 106 109 105  CO2 22 15* 16* 21*  GLUCOSE 99 81 87 105*  BUN 34* 35* 36* 35*  CREATININE 2.01* 2.02* 1.67* 1.67*  CALCIUM 9.1 8.0* 7.7* 8.0*  MG  --   --  1.6* 1.7   Liver Function  Tests: Recent Labs  Lab 11/08/23 2030 11/09/23 0513  AST 104* 92*  ALT 29 26  ALKPHOS 86 68  BILITOT 0.9 0.8  PROT 8.3* 7.1  ALBUMIN 4.0 3.4*   Recent Labs  Lab 11/08/23 2200  LIPASE 53*   No results for input(s): "AMMONIA" in the last 168 hours. Cardiac Enzymes: Recent Labs  Lab 11/08/23 2200 11/10/23 0530  CKTOTAL 3,480* 1,692*   BNP (last 3 results) Recent Labs    01/03/23 1505 02/22/23 2156  BNP 381.4* 128.9*    ProBNP (last 3 results) No results for input(s): "PROBNP" in the last 8760 hours.  CBG: Recent Labs  Lab 11/11/23 1159 11/11/23 1623 11/11/23 2121 11/12/23 0733 11/12/23 1127  GLUCAP 110* 116* 161* 104* 144*   Recent Results (from  the past 240 hours)  Culture, blood (Routine x 2)     Status: None (Preliminary result)   Collection Time: 11/08/23  8:30 PM   Specimen: BLOOD  Result Value Ref Range Status   Specimen Description BLOOD RIGHT ANTECUBITAL  Final   Special Requests   Final    BOTTLES DRAWN AEROBIC AND ANAEROBIC Blood Culture adequate volume   Culture   Final    NO GROWTH 4 DAYS Performed at [facility], [address], [place], [place] [address]    Report Status PENDING  Incomplete  Culture, blood (Routine x 2)     Status: None (Preliminary result)   Collection Time: 11/08/23  8:32 PM   Specimen: BLOOD  Result Value Ref Range Status   Specimen Description BLOOD LEFT ANTECUBITAL  Final   Special Requests   Final    BOTTLES DRAWN AEROBIC AND ANAEROBIC Blood Culture adequate volume   Culture   Final    NO GROWTH 4 DAYS Performed at [facility], [address], [place], [place] [address]    Report Status PENDING  Incomplete  Resp panel by RT-PCR (RSV, Flu A&B, Covid) Anterior Nasal Swab     Status: Abnormal   Collection Time: 11/08/23  9:46 PM   Specimen: Anterior Nasal Swab  Result Value Ref Range Status   SARS Coronavirus 2 by RT PCR NEGATIVE NEGATIVE Final   Influenza A by PCR POSITIVE (A) NEGATIVE Final    Influenza B by PCR NEGATIVE NEGATIVE Final    Comment: (NOTE) The Xpert Xpress SARS-CoV-2/FLU/RSV plus assay is intended as an aid in the diagnosis of influenza from Nasopharyngeal swab specimens and should not be used as a sole basis for treatment. Nasal washings and aspirates are unacceptable for Xpert Xpress SARS-CoV-2/FLU/RSV testing.  Fact Sheet for Patients: BloggerCourse.com  Fact Sheet for Healthcare Providers: SeriousBroker.it  This test is not yet approved or cleared by the [place] FDA and has been authorized for detection and/or diagnosis of SARS-CoV-2 by FDA under an Emergency Use Authorization (EUA). This EUA will remain in effect (meaning this test can be used) for the duration of the COVID-19 declaration under Section 564(b)(1) of the Act, 21 U.S.C. section 360bbb-3(b)(1), unless the authorization is terminated or revoked.     Resp Syncytial Virus by PCR NEGATIVE NEGATIVE Final    Comment: (NOTE) Fact Sheet for Patients: BloggerCourse.com  Fact Sheet for Healthcare Providers: SeriousBroker.it  This test is not yet approved or cleared by the [place] FDA and has been authorized for detection and/or diagnosis of SARS-CoV-2 by FDA under an Emergency Use Authorization (EUA). This EUA will remain in effect (meaning this test can be used) for the duration of the COVID-19 declaration under Section 564(b)(1) of the Act, 21 U.S.C. section 360bbb-3(b)(1), unless the authorization is terminated or revoked.  Performed at [facility], [address] [place], [place] [address]   Urine Culture     Status: Abnormal   Collection Time: 11/09/23 12:13 AM   Specimen: Urine, Random  Result Value Ref Range Status   Specimen Description URINE, RANDOM  Final   Special Requests   Final    NONE Reflexed from [id number] Performed at [facility], [address] [place], [place] [address]    Culture 40,000 COLONIES/mL ENTEROCOCCUS FAECALIS (A)  Final   Report Status 11/11/2023 FINAL  Final   Organism ID, Bacteria ENTEROCOCCUS FAECALIS (A)  Final      Susceptibility  Enterococcus faecalis - MIC*    AMPICILLIN <=2 SENSITIVE Sensitive     NITROFURANTOIN <=16 SENSITIVE Sensitive     VANCOMYCIN 1 SENSITIVE Sensitive     * 40,000 COLONIES/mL ENTEROCOCCUS FAECALIS     Studies: No results found.     Joycelyn Das, MD  [facility] 11/12/2023  If 7PM-7AM, please contact night-coverage

## 2023-11-12 NOTE — [place] FL2 (Signed)
 [place] MEDICAID FL2 LEVEL OF CARE FORM     IDENTIFICATION  Patient Name: Luis Carter Birthdate: 1945/10/05 Sex: male Admission Date (Current Location): 11/08/2023  [facility] and [place] Number:  Producer, television/film/video and Address:  [facility] [place], [address] [place], [place] [address]      Provider Number: [id number]  Attending Physician Name and Address:  Joycelyn Das, MD  Relative Name and Phone Number:       Current Level of Care: Hospital Recommended Level of Care: Skilled Nursing Facility Prior Approval Number:    Date Approved/Denied:   PASRR Number: [id number]  Discharge Plan: SNF    Current Diagnoses: Patient Active Problem List   Diagnosis Date Noted   Influenza A 11/09/2023   Sinus bradycardia 02/20/2023   Bradycardia 02/20/2023   UTI (urinary tract infection) 01/07/2023   Acute on chronic diastolic CHF (congestive heart failure) (HCC) 01/03/2023   Acute pyelonephritis 03/18/2022   Elevated troponin 03/18/2022   Chronic diastolic CHF (congestive heart failure) (HCC) 03/18/2022   Physical debility 09/17/2021   Acute right hip pain 09/16/2021   HLD (hyperlipidemia) 09/15/2021   COVID-19 virus infection 09/15/2021   SIRS (systemic inflammatory response syndrome) (HCC) 03/11/2021   Malnutrition of moderate degree 02/11/2021   Hyperkalemia 02/10/2021   Dyspnea 02/08/2021   Community acquired pneumonia of right upper lobe of lung    Respiratory failure with hypoxia and hypercapnia (HCC) 10/11/2018   Acute pain of left shoulder 10/15/2016   Pain in right leg 10/15/2016   Weakness of right leg 10/15/2016   Cardiomyopathy (HCC) 01/25/2016   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   Abnormal echocardiogram 12/13/2015   Near syncope 12/12/2015   Tobacco use disorder 09/07/2015   Gout 01/27/2012   Cocaine use 12/22/2011   Chest pain 12/18/2008   Type 2 diabetes mellitus with hyperlipidemia (HCC) 04/14/2007    Hypertension 04/14/2007   Asthma 04/14/2007   COPD (chronic obstructive pulmonary disease) (HCC) 04/14/2007   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   Seizures (HCC) 04/14/2007   History of digestive system disease 04/14/2007    Orientation RESPIRATION BLADDER Height & Weight     Self, Time, Situation, Place  Normal Continent Weight: 179 lb 14.3 oz (81.6 kg) Height:  5\' 3"  (160 cm)  BEHAVIORAL SYMPTOMS/MOOD NEUROLOGICAL BOWEL NUTRITION STATUS      Continent Diet (please see discharge summary)  AMBULATORY STATUS COMMUNICATION OF NEEDS Skin   Limited Assist Verbally Normal                       Personal Care Assistance Level of Assistance  Bathing, Feeding, Dressing Bathing Assistance: Limited assistance Feeding assistance: Independent Dressing Assistance: Limited assistance     Functional Limitations Info  Sight, Hearing, Speech Sight Info: Adequate Hearing Info: Adequate Speech Info: Adequate    SPECIAL CARE FACTORS FREQUENCY  PT (By licensed PT), OT (By licensed OT)     PT Frequency: 5x per week OT Frequency: 5x per week            Contractures Contractures Info: Not present    Additional Factors Info  Code Status, Allergies Code Status Info: FULL Allergies Info: NKA           Current Medications (11/12/2023):  This is the current hospital active medication list Current Facility-Administered Medications  Medication Dose Route Frequency Provider Last Rate Last Admin   acetaminophen (TYLENOL) tablet 650 mg  650  mg Oral Q6H PRN Lurline Del, MD   650 mg at 11/12/23 9563   Or   acetaminophen (TYLENOL) suppository 650 mg  650 mg Rectal Q6H PRN Lurline Del, MD       albuterol (PROVENTIL) (2.5 MG/3ML) 0.083% nebulizer solution 2.5 mg  2.5 mg Nebulization Q2H PRN Skip Mayer A, MD   2.5 mg at 11/11/23 0750   allopurinol (ZYLOPRIM) tablet 200 mg  200 mg Oral Daily Skip Mayer A, MD   200 mg at 11/12/23 0953   amoxicillin  (AMOXIL) capsule 500 mg  500 mg Oral Q12H Pokhrel, Laxman, MD   500 mg at 11/12/23 8756   aspirin EC tablet 81 mg  81 mg Oral Daily Skip Mayer A, MD   81 mg at 11/12/23 0953   atorvastatin (LIPITOR) tablet 20 mg  20 mg Oral QHS Skip Mayer A, MD   20 mg at 11/11/23 2116   heparin injection 5,000 Units  5,000 Units Subcutaneous Q8H Skip Mayer A, MD   5,000 Units at 11/12/23 1421   hydrALAZINE (APRESOLINE) tablet 25 mg  25 mg Oral Q8H Pokhrel, Laxman, MD   25 mg at 11/12/23 1421   insulin aspart (novoLOG) injection 0-6 Units  0-6 Units Subcutaneous TID WC Skip Mayer A, MD       magnesium oxide (MAG-OX) tablet 400 mg  400 mg Oral BID Pokhrel, Laxman, MD   400 mg at 11/12/23 0954   ondansetron (ZOFRAN) tablet 4 mg  4 mg Oral Q6H PRN Lurline Del, MD       Or   ondansetron [facility]) injection 4 mg  4 mg Intravenous Q6H PRN Lurline Del, MD       oseltamivir (TAMIFLU) capsule 30 mg  30 mg Oral Daily Lurline Del, MD   30 mg at 11/12/23 4332     Discharge Medications: Please see discharge summary for a list of discharge medications.  Relevant Imaging Results:  Relevant Lab Results:   Additional Information SSN [id number]  [name], LCSW

## 2023-11-13 DIAGNOSIS — J101 Influenza due to other identified influenza virus with other respiratory manifestations: Secondary | ICD-10-CM | POA: Diagnosis not present

## 2023-11-13 LAB — CULTURE, BLOOD (ROUTINE X 2)
Culture: NO GROWTH
Culture: NO GROWTH
Special Requests: ADEQUATE
Special Requests: ADEQUATE

## 2023-11-13 LAB — GLUCOSE, CAPILLARY
Glucose-Capillary: 109 mg/dL — ABNORMAL HIGH (ref 70–99)
Glucose-Capillary: 113 mg/dL — ABNORMAL HIGH (ref 70–99)
Glucose-Capillary: 117 mg/dL — ABNORMAL HIGH (ref 70–99)
Glucose-Capillary: 183 mg/dL — ABNORMAL HIGH (ref 70–99)

## 2023-11-13 MED ORDER — GUAIFENESIN-DM 100-10 MG/5ML PO SYRP
5.0000 mL | ORAL_SOLUTION | ORAL | Status: DC | PRN
Start: 2023-11-13 — End: 2023-11-14
  Administered 2023-11-13: 5 mL via ORAL
  Filled 2023-11-13: qty 5

## 2023-11-13 MED ORDER — ALBUTEROL SULFATE (2.5 MG/3ML) 0.083% IN NEBU
2.5000 mg | INHALATION_SOLUTION | Freq: Four times a day (QID) | RESPIRATORY_TRACT | Status: DC
  Administered 2023-11-13 – 2023-11-14 (×3): 2.5 mg via RESPIRATORY_TRACT
  Filled 2023-11-13 (×3): qty 3

## 2023-11-13 MED ORDER — GUAIFENESIN-DM 100-10 MG/5ML PO SYRP
10.0000 mL | ORAL_SOLUTION | Freq: Once | ORAL | Status: AC | PRN
  Administered 2023-11-13: 5 mL via ORAL
  Filled 2023-11-13: qty 10

## 2023-11-13 NOTE — TOC Progression Note (Addendum)
 Transition of Care [facility]) - Progression Note    Patient Details  Name: Luis Carter MRN: [id number] Date of Birth: 06-15-1946  [facility] CM/SW Contact  Delilah Shan, LCSWA Phone Number: 11/13/2023, 5:13 PM  Clinical Narrative:     CSW provided SNF bed offers to patient. Patient accepted SNF bed offer with [facility]. CSW spoke with Kia with [facility] who confirmed SNF bed for patient. CSW will start insurance authorization for patient. CSW will continue to follow.  Update- [employer] authorization was approved Auth ID# [id number]. Insurance authorization approved from 3/22-3/24. CSW informed Facility. Facility confirmed they can accept patient tomorrow if medically stable. CSW informed MD.  Expected Discharge Plan: Skilled Nursing Facility Barriers to Discharge: Insurance Authorization, SNF Pending bed offer  Expected Discharge Plan and Services In-house Referral: Clinical Social Work     Living arrangements for the past 2 months: Single Family Home Expected Discharge Date: 11/10/23                                     Social Determinants of Health (SDOH) Interventions SDOH Screenings   Food Insecurity: No Food Insecurity (11/10/2023)  Housing: Low Risk  (11/10/2023)  Transportation Needs: No Transportation Needs (11/10/2023)  Utilities: Not At Risk (11/10/2023)  Social Connections: Socially Isolated (11/10/2023)  Tobacco Use: Medium Risk (11/08/2023)    Readmission Risk Interventions    09/19/2021    2:31 PM  Readmission Risk Prevention Plan  Transportation Screening Complete  PCP or Specialist Appt within 3-5 Days Complete  HRI or Home Care Consult Complete  Social Work Consult for Recovery Care Planning/Counseling Patient refused  Palliative Care Screening Not Applicable  Medication Review Oceanographer) Complete

## 2023-11-13 NOTE — Progress Notes (Signed)
 PROGRESS NOTE  Luis Carter WRU:[id number] DOB: 1946/07/19 DOA: 11/08/2023 PCP: Fleet Contras, MD   LOS: 4 days   Brief narrative:  Luis Carter is a 78 y.o. male with medical history significant of pretension, CKD stage IIIb, asthma COPD, congestive heart failure, hyperlipidemia, type 2 diabetes, gout, remote history of seizure disorder presented to the hospital with confusion for 1 day.  Has not been feeling well for a week.  Unable to describe further symptoms by the patient.  In the ED, patient was slightly tachycardic.  Labs were notable for creatinine of 2.0 with AST elevated at 104.  Chest x-ray showed no acute pulmonary disease.  Respiratory viral panel wa for influenza.  Urinalysis showed white cells 11-20 with bacteria.  CK level was elevated at 3480.  EKG was nonspecific.  CT head was negative for acute findings.  CT scan of the abdomen and pelvis showed nodularity along the anterior bladder wall could not exclude bladder cancer.  Patient was given Rocephin IV and was considered for admission to the hospital for further evaluation and treatment    Assessment/Plan: Principal Problem:   Influenza A Active Problems:   Stage 3b chronic kidney disease (CKD) (HCC)   Hypertension   UTI (urinary tract infection)   HLD (hyperlipidemia)  Influenza A No hypoxia.  Completed course of Tamiflu.  Mild Rhabdomyolysis Received fluids.  CK level on presentation at 3480.  Improved.    Enterococcus UTI Abnormal urinalysis altered mental status.  Urine culture showed Enterococcus faecalis.  Was on IV Rocephin which has been changed to amoxicillin.  Plan for total 7-day course.  Blood cultures negative in 5 days.  Acute metabolic encephalopathy due to influenza and UTI. Continue supportive care.  Delirium precautions- significantly improved.  Currently at baseline.   Possible AKI on CKD stage IIIb. Will continue to monitor creatinine levels.  Latest creatinine at 1.6.  Likely  baseline creatinine between 1.3-1.7.   Elevated and flat troponins. -Nonspecific.  No chest pain or EKG changes.  2D echocardiogram with LV ejection fraction of 60 to 65% with grade 1 diastolic dysfunction with no regional wall motion abnormality.    Urinary Bladder nodularity. Spoke with [facility] for outpatient appointment.  Appointment has been made for March 26 at 9:30 AM.   HTN   Lisinopril at home.  Will continue to hold for now.  Continue oral hydralazine.  Continue to monitor blood pressure.  Relatively controlled today.    Asthma/COPD Continue Advair as needed nebs.  Appears to be stable.    Chronic diastolic heart failure. Check 2D echocardiogram.  Compensated at this time.  Hold lisinopril for now.  Latest creatinine of 1.6   Hyperlipidemia -continue atorvastatin    Diabetes type II. Patient is on metformin at home. Continue sliding scale insulin, diabetic diet.  Latest POC glucose of 114.   Gout -continue allopurinol  DVT prophylaxis: heparin injection 5,000 Units Start: 11/09/23 0600   Disposition: Skilled nursing facility as per PT recommendation.  Medically stable for disposition.  Status is: Inpatient  Remains inpatient appropriate because: Requiring rehabilitation.  Medically stable for disposition.  Code Status:     Code Status: Full Code  Family Communication: Spoke with the patient's sister on the phone on 11/10/2023.  Consultants: Outpatient urology referral.  Procedures: None  Anti-infectives:   amoxicillin.  Subjective:  Today, patient was seen and examined at bedside.  Patient denies interval complaints.  Denies any nausea vomiting fever chills or rigor.  Objective: Vitals:  11/13/23 0459 11/13/23 1115  BP: 137/67 132/62  Pulse:  75  Resp: 18 18  Temp: 98.2 F (36.8 C) 97.6 F (36.4 C)  SpO2:  98%    Intake/Output Summary (Last 24 hours) at 11/13/2023 1307 Last data filed at 11/13/2023 0830 Gross per 24 hour  Intake 480  ml  Output 750 ml  Net -270 ml   Filed Weights   11/08/23 2010  Weight: 81.6 kg   Body mass index is 31.87 kg/m.   Physical Exam:  GENERAL: Patient is alert awake and oriented. Not in obvious distress. Elderly male.  Obese built HENT: No scleral pallor or icterus. Pupils equally reactive to light. Oral mucosa is moist, mild neck discomfort today. NECK: is supple, no gross swelling noted. CHEST: Clear to auscultation. No crackles or wheezes.   CVS: S1 and S2 heard, no murmur. Regular rate and rhythm.  ABDOMEN: Soft, non-tender, bowel sounds are present. EXTREMITIES: No edema. CNS: Cranial nerves are intact. No focal motor deficits.   SKIN: warm and dry without rashes.  Data Review: I have personally reviewed the following laboratory data and studies,  CBC: Recent Labs  Lab 11/08/23 2030 11/09/23 0513 11/10/23 0530 11/11/23 0353  WBC 6.2 6.0 5.2 5.3  NEUTROABS 3.9  --   --   --   HGB 13.3 11.8* 11.8* 11.0*  HCT 42.1 39.2 39.5 33.9*  MCV 85.4 89.7 90.8 84.5  PLT 204 187 152 157   Basic Metabolic Panel: Recent Labs  Lab 11/08/23 2030 11/09/23 0513 11/10/23 0530 11/11/23 0353  NA 137 136 137 133*  K 4.7 4.4 4.2 3.6  CL 104 106 109 105  CO2 22 15* 16* 21*  GLUCOSE 99 81 87 105*  BUN 34* 35* 36* 35*  CREATININE 2.01* 2.02* 1.67* 1.67*  CALCIUM 9.1 8.0* 7.7* 8.0*  MG  --   --  1.6* 1.7   Liver Function Tests: Recent Labs  Lab 11/08/23 2030 11/09/23 0513  AST 104* 92*  ALT 29 26  ALKPHOS 86 68  BILITOT 0.9 0.8  PROT 8.3* 7.1  ALBUMIN 4.0 3.4*   Recent Labs  Lab 11/08/23 2200  LIPASE 53*   No results for input(s): "AMMONIA" in the last 168 hours. Cardiac Enzymes: Recent Labs  Lab 11/08/23 2200 11/10/23 0530  CKTOTAL 3,480* 1,692*   BNP (last 3 results) Recent Labs    01/03/23 1505 02/22/23 2156  BNP 381.4* 128.9*    ProBNP (last 3 results) No results for input(s): "PROBNP" in the last 8760 hours.  CBG: Recent Labs  Lab 11/12/23 1127  11/12/23 1625 11/12/23 2118 11/13/23 0731 11/13/23 1112  GLUCAP 144* 147* 164* 109* 113*   Recent Results (from the past 240 hours)  Culture, blood (Routine x 2)     Status: None   Collection Time: 11/08/23  8:30 PM   Specimen: BLOOD  Result Value Ref Range Status   Specimen Description BLOOD RIGHT ANTECUBITAL  Final   Special Requests   Final    BOTTLES DRAWN AEROBIC AND ANAEROBIC Blood Culture adequate volume   Culture   Final    NO GROWTH 5 DAYS Performed at [facility], [address] [place], [place] [address]    Report Status 11/13/2023 FINAL  Final  Culture, blood (Routine x 2)     Status: None   Collection Time: 11/08/23  8:32 PM   Specimen: BLOOD  Result Value Ref Range Status   Specimen Description BLOOD LEFT ANTECUBITAL  Final  Special Requests   Final    BOTTLES DRAWN AEROBIC AND ANAEROBIC Blood Culture adequate volume   Culture   Final    NO GROWTH 5 DAYS Performed at [facility], [address], [place], [place] [address]    Report Status 11/13/2023 FINAL  Final  Resp panel by RT-PCR (RSV, Flu A&B, Covid) Anterior Nasal Swab     Status: Abnormal   Collection Time: 11/08/23  9:46 PM   Specimen: Anterior Nasal Swab  Result Value Ref Range Status   SARS Coronavirus 2 by RT PCR NEGATIVE NEGATIVE Final   Influenza A by PCR POSITIVE (A) NEGATIVE Final   Influenza B by PCR NEGATIVE NEGATIVE Final    Comment: (NOTE) The Xpert Xpress SARS-CoV-2/FLU/RSV plus assay is intended as an aid in the diagnosis of influenza from Nasopharyngeal swab specimens and should not be used as a sole basis for treatment. Nasal washings and aspirates are unacceptable for Xpert Xpress SARS-CoV-2/FLU/RSV testing.  Fact Sheet for Patients: BloggerCourse.com  Fact Sheet for Healthcare Providers: SeriousBroker.it  This test is not yet approved or cleared by the [place] FDA and has been authorized for detection  and/or diagnosis of SARS-CoV-2 by FDA under an Emergency Use Authorization (EUA). This EUA will remain in effect (meaning this test can be used) for the duration of the COVID-19 declaration under Section 564(b)(1) of the Act, 21 U.S.C. section 360bbb-3(b)(1), unless the authorization is terminated or revoked.     Resp Syncytial Virus by PCR NEGATIVE NEGATIVE Final    Comment: (NOTE) Fact Sheet for Patients: BloggerCourse.com  Fact Sheet for Healthcare Providers: SeriousBroker.it  This test is not yet approved or cleared by the [place] FDA and has been authorized for detection and/or diagnosis of SARS-CoV-2 by FDA under an Emergency Use Authorization (EUA). This EUA will remain in effect (meaning this test can be used) for the duration of the COVID-19 declaration under Section 564(b)(1) of the Act, 21 U.S.C. section 360bbb-3(b)(1), unless the authorization is terminated or revoked.  Performed at [facility], [address] [place], [place] [address]   Urine Culture     Status: Abnormal   Collection Time: 11/09/23 12:13 AM   Specimen: Urine, Random  Result Value Ref Range Status   Specimen Description URINE, RANDOM  Final   Special Requests   Final    NONE Reflexed from [id number] Performed at [facility], [address], [place], [place] [address]    Culture 40,000 COLONIES/mL ENTEROCOCCUS FAECALIS (A)  Final   Report Status 11/11/2023 FINAL  Final   Organism ID, Bacteria ENTEROCOCCUS FAECALIS (A)  Final      Susceptibility   Enterococcus faecalis - MIC*    AMPICILLIN <=2 SENSITIVE Sensitive     NITROFURANTOIN <=16 SENSITIVE Sensitive     VANCOMYCIN 1 SENSITIVE Sensitive     * 40,000 COLONIES/mL ENTEROCOCCUS FAECALIS     Studies: No results found.     Joycelyn Das, MD  [facility] 11/13/2023  If 7PM-7AM, please contact night-coverage

## 2023-11-13 NOTE — Progress Notes (Signed)
 Mobility Specialist Progress Note;   11/13/23 1415  Mobility  Activity Ambulated with assistance in hallway;Ambulated with assistance to bathroom  Level of Assistance Minimal assist, patient does 75% or more  Assistive Device Front wheel walker  Distance Ambulated (ft) 40 ft  Activity Response Tolerated fair  Mobility Referral Yes  Mobility visit 1 Mobility  Mobility Specialist Start Time (ACUTE ONLY) 1415  Mobility Specialist Stop Time (ACUTE ONLY) 1430  Mobility Specialist Time Calculation (min) (ACUTE ONLY) 15 min   Pt agreeable to mobility. Required no physical assistance for bed mobility but MinA during ambulation for stability and safety. Able to ambulate out into hall, and returned pt back to room w/o fault. VS required for RW proximity. Took 1x seated rest break d/t fatigue. Pt requested assistance to BR, void successful. Pt returned back to bed with all needs met.   Caesar Bookman Mobility Specialist Please contact via SecureChat or [employer] [phone]

## 2023-11-13 NOTE — Care Management Important Message (Signed)
 Important Message  Patient Details  Name: Luis Carter MRN: [id number] Date of Birth: [birth year]   Important Message Given:  Yes - Medicare IM     Renie Ora 11/13/2023, 10:48 AM

## 2023-11-14 DIAGNOSIS — J101 Influenza due to other identified influenza virus with other respiratory manifestations: Secondary | ICD-10-CM | POA: Diagnosis not present

## 2023-11-14 LAB — CBC
HCT: 32.1 % — ABNORMAL LOW (ref 39.0–52.0)
Hemoglobin: 10.5 g/dL — ABNORMAL LOW (ref 13.0–17.0)
MCH: 27.3 pg (ref 26.0–34.0)
MCHC: 32.7 g/dL (ref 30.0–36.0)
MCV: 83.4 fL (ref 80.0–100.0)
Platelets: 210 10*3/uL (ref 150–400)
RBC: 3.85 MIL/uL — ABNORMAL LOW (ref 4.22–5.81)
RDW: 15 % (ref 11.5–15.5)
WBC: 7.6 10*3/uL (ref 4.0–10.5)
nRBC: 0 % (ref 0.0–0.2)

## 2023-11-14 LAB — BASIC METABOLIC PANEL
Anion gap: 10 (ref 5–15)
BUN: 25 mg/dL — ABNORMAL HIGH (ref 8–23)
CO2: 20 mmol/L — ABNORMAL LOW (ref 22–32)
Calcium: 8.6 mg/dL — ABNORMAL LOW (ref 8.9–10.3)
Chloride: 104 mmol/L (ref 98–111)
Creatinine, Ser: 1.32 mg/dL — ABNORMAL HIGH (ref 0.61–1.24)
GFR, Estimated: 55 mL/min — ABNORMAL LOW (ref >60)
Glucose, Bld: 117 mg/dL — ABNORMAL HIGH (ref 70–99)
Potassium: 4.4 mmol/L (ref 3.5–5.1)
Sodium: 134 mmol/L — ABNORMAL LOW (ref 135–145)

## 2023-11-14 LAB — GLUCOSE, CAPILLARY: Glucose-Capillary: 114 mg/dL — ABNORMAL HIGH (ref 70–99)

## 2023-11-14 MED ORDER — GUAIFENESIN-DM 100-10 MG/5ML PO SYRP: 5.0000 mL | ORAL_SOLUTION | ORAL | Status: DC | PRN

## 2023-11-14 MED ORDER — AMOXICILLIN 500 MG PO CAPS: 500.0000 mg | ORAL_CAPSULE | Freq: Two times a day (BID) | ORAL | Status: AC

## 2023-11-14 MED ORDER — ALBUTEROL SULFATE (2.5 MG/3ML) 0.083% IN NEBU: 2.5000 mg | INHALATION_SOLUTION | Freq: Two times a day (BID) | RESPIRATORY_TRACT | Status: DC

## 2023-11-14 NOTE — Progress Notes (Signed)
 Report called to [employer] at [facility] [phone].

## 2023-11-14 NOTE — Discharge Summary (Signed)
 Physician Discharge Summary  Luis Carter WUJ:[id number] DOB: [birth year] DOA: 11/08/2023  PCP: Fleet Contras, MD  Admit date: 11/08/2023 Discharge date: 11/14/2023  Admitted From: Skilled nursing facility  Discharge disposition: Skilled nursing facility   Recommendations for Outpatient Follow-Up:   Follow up with your primary care provider at the skilled nursing facility in 3 to 5 days. Check CBC, BMP, magnesium in the next visit Follow-up with [facility] on March 26 at 9:30 AM for bladder nodularity follow-up.   Discharge Diagnosis:   Principal Problem:   Influenza A Active Problems:   Stage 3b chronic kidney disease (CKD) (HCC)   Hypertension   UTI (urinary tract infection)   HLD (hyperlipidemia)   Discharge Condition: Improved.  Diet recommendation: Soft diet  Wound care: None.  Code status: Full.   History of Present Illness:   Luis Carter is a 78 y.o. male with medical history significant of pretension, CKD stage IIIb, asthma COPD, congestive heart failure, hyperlipidemia, type 2 diabetes, gout, remote history of seizure disorder presented to the hospital with confusion for 1 day.  Has not been feeling well for a week.  Unable to describe further symptoms by the patient.  In the ED, patient was slightly tachycardic.  Labs were notable for creatinine of 2.0 with AST elevated at 104.  Chest x-ray showed no acute pulmonary disease.  Respiratory viral panel wa for influenza.  Urinalysis showed white cells 11-20 with bacteria.  CK level was elevated at 3480.  EKG was nonspecific.  CT head was negative for acute findings.  CT scan of the abdomen and pelvis showed nodularity along the anterior bladder wall could not exclude bladder cancer.  Patient was given Rocephin IV and was considered for admission to the hospital for further evaluation and treatment    Hospital Course:   Following conditions were addressed during hospitalization as listed  below,  Influenza A No hypoxia.  Completed course of Tamiflu.   Mild Rhabdomyolysis Received fluids.  CK level on presentation at 3480.  Improved.    Enterococcus UTI Abnormal urinalysis altered mental status.  Urine culture showed Enterococcus faecalis.  Was on IV Rocephin which has been changed to amoxicillin.  Plan for total 7-day course.  Will continue for next few days on discharge to complete course.  Blood cultures negative in 5 days.   Acute metabolic encephalopathy due to influenza and UTI. Continue supportive care.  Delirium precautions- significantly improved.  Currently at baseline.   Possible AKI on CKD stage IIIb. Will continue to monitor creatinine levels.  Latest creatinine at 1.6.  Likely baseline creatinine between 1.3-1.7.   Elevated and flat troponins. -Nonspecific.  No chest pain or EKG changes.  2D echocardiogram with LV ejection fraction of 60 to 65% with grade 1 diastolic dysfunction with no regional wall motion abnormality.    Urinary Bladder nodularity. Spoke with [facility] for outpatient appointment.  Appointment has been made for March 26 at 9:30 AM.   HTN Continue lisinopril from home    Asthma/COPD Continue   Continue bronchodilators and Advair at home.   Chronic diastolic heart failure. Check 2D echocardiogram.  Compensated at this time.  Latest creatinine of 1.3   Hyperlipidemia -continue atorvastatin    Diabetes type II. Patient is on metformin at home will resume on discharge.   Gout -continue allopurinol   Disposition.  At this time, patient is stable for disposition to skilled nursing facility.  Medical Consultants:   Verbal consult- urology  Procedures:  None Subjective:   Today, patient was seen and examined at bedside.  Feels okay.  Has mild cough but no shortness of breath dyspnea dizziness nausea vomiting.  Discharge Exam:   Vitals:   11/14/23 0653 11/14/23 0914  BP: 124/70   Pulse:    Resp:    Temp:     SpO2:  99%   Vitals:   11/14/23 0122 11/14/23 0338 11/14/23 0653 11/14/23 0914  BP:  137/64 124/70   Pulse: 75 81    Resp: 16 16    Temp:  98.2 F (36.8 C)    TempSrc:  Oral    SpO2: 100% 100%  99%  Weight:      Height:       Body mass index is 31.87 kg/m.   General: Alert awake, not in obvious distress, obese built, elderly male, Communicative HENT: pupils equally reacting to light,  No scleral pallor or icterus noted. Oral mucosa is moist.  Chest: Diminished breath sounds bilaterally. CVS: S1 &S2 heard. No murmur.  Regular rate and rhythm. Abdomen: Soft, nontender, nondistended.  Bowel sounds are heard.   Extremities: No cyanosis, clubbing or edema.  Peripheral pulses are palpable. Psych: Alert, awake and oriented, normal mood CNS:  No cranial nerve deficits.  Power equal in all extremities.  Moves all extremities. Skin: Warm and dry.  No rashes noted.  The results of significant diagnostics from this hospitalization (including imaging, microbiology, ancillary and laboratory) are listed below for reference.     Diagnostic Studies:   ECHOCARDIOGRAM COMPLETE Result Date: 11/09/2023    ECHOCARDIOGRAM REPORT   Patient Name:   Luis Carter Date of Exam: 11/09/2023 Medical Rec #:  [id number]          Height:       63.0 in Accession #:    [id number]         Weight:       179.9 lb Date of Birth:  1946/06/24           BSA:          1.849 m Patient Age:    78 years           BP:           156/81 mmHg Patient Gender: M                  HR:           78 bpm. Exam Location:  Inpatient Procedure: 2D Echo, 3D Echo, Cardiac Doppler, Color Doppler and Strain Analysis            (Both Spectral and Color Flow Doppler were utilized during            procedure). Indications:    Fever, elevated troponin  History:        Patient has prior history of Echocardiogram examinations, most                 recent 01/04/2023. Cardiomyopathy, COPD, Arrythmias:Bradycardia,                 Signs/Symptoms:Chest  Pain; Risk Factors:Dyslipidemia, Diabetes,                 Former Smoker and Hypertension. CKD stage 3.  Sonographer:    Vern Claude Referring Phys: [id number] Lareen Mullings IMPRESSIONS  1. Left ventricular ejection fraction, by estimation, is 60 to 65%. Left ventricular ejection fraction by 3D volume is 58 %. The left ventricle has normal function.  The left ventricle has no regional wall motion abnormalities. Left ventricular diastolic  parameters are consistent with Grade I diastolic dysfunction (impaired relaxation). The average left ventricular global longitudinal strain is -17.0 %. The global longitudinal strain is abnormal.  2. Right ventricular systolic function is normal. The right ventricular size is normal. Tricuspid regurgitation signal is inadequate for assessing PA pressure.  3. The mitral valve is normal in structure. No evidence of mitral valve regurgitation. No evidence of mitral stenosis.  4. The aortic valve is tricuspid. There is moderate calcification of the aortic valve. Aortic valve regurgitation is not visualized. Aortic valve sclerosis/calcification is present, without any evidence of aortic stenosis.  5. Aortic dilatation noted. There is mild dilatation of the ascending aorta, measuring 43 mm.  6. The inferior vena cava is normal in size with greater than 50% respiratory variability, suggesting right atrial pressure of 3 mmHg. FINDINGS  Left Ventricle: Left ventricular ejection fraction, by estimation, is 60 to 65%. Left ventricular ejection fraction by 3D volume is 58 %. The left ventricle has normal function. The left ventricle has no regional wall motion abnormalities. The average left ventricular global longitudinal strain is -17.0 %. Strain was performed and the global longitudinal strain is abnormal. The left ventricular internal cavity size was normal in size. There is no left ventricular hypertrophy. Left ventricular diastolic parameters are consistent with Grade I diastolic  dysfunction (impaired relaxation). Right Ventricle: The right ventricular size is normal. No increase in right ventricular wall thickness. Right ventricular systolic function is normal. Tricuspid regurgitation signal is inadequate for assessing PA pressure. Left Atrium: Left atrial size was normal in size. Right Atrium: Right atrial size was normal in size. Pericardium: There is no evidence of pericardial effusion. Mitral Valve: The mitral valve is normal in structure. There is mild calcification of the mitral valve leaflet(s). No evidence of mitral valve regurgitation. No evidence of mitral valve stenosis. MV peak gradient, 3.4 mmHg. The mean mitral valve gradient  is 1.0 mmHg. Tricuspid Valve: The tricuspid valve is normal in structure. Tricuspid valve regurgitation is not demonstrated. Aortic Valve: The aortic valve is tricuspid. There is moderate calcification of the aortic valve. Aortic valve regurgitation is not visualized. Aortic valve sclerosis/calcification is present, without any evidence of aortic stenosis. Aortic valve mean gradient measures 4.0 mmHg. Aortic valve peak gradient measures 7.2 mmHg. Aortic valve area, by VTI measures 2.31 cm. Pulmonic Valve: The pulmonic valve was normal in structure. Pulmonic valve regurgitation is not visualized. Aorta: The aortic root is normal in size and structure and aortic dilatation noted. There is mild dilatation of the ascending aorta, measuring 43 mm. Venous: The inferior vena cava is normal in size with greater than 50% respiratory variability, suggesting right atrial pressure of 3 mmHg. IAS/Shunts: No atrial level shunt detected by color flow Doppler. Additional Comments: 3D was performed not requiring image post processing on an independent workstation and was normal.  LEFT VENTRICLE PLAX 2D LVIDd:         4.10 cm         Diastology LVIDs:         2.80 cm         LV e' medial:    6.09 cm/s LV PW:         0.60 cm         LV E/e' medial:  6.7 LV IVS:         0.90 cm         LV e' lateral:  8.27 cm/s LVOT diam:     2.00 cm         LV E/e' lateral: 4.9 LV SV:         46 LV SV Index:   25              2D Longitudinal LVOT Area:     3.14 cm        Strain                                2D Strain GLS   -17.0 %                                Avg: LV Volumes (MOD) LV vol d, MOD    93.5 ml       3D Volume EF A2C:                           LV 3D EF:    Left LV vol d, MOD    105.0 ml                   ventricul A4C:                                        ar LV vol s, MOD    27.1 ml                    ejection A2C:                                        fraction LV vol s, MOD    40.2 ml                    by 3D A4C:                                        volume is LV SV MOD A2C:   66.4 ml                    58 %. LV SV MOD A4C:   105.0 ml LV SV MOD BP:    67.4 ml                                3D Volume EF:                                3D EF:        58 %                                LV EDV:       199 ml  LV ESV:       83 ml                                LV SV:        115 ml RIGHT VENTRICLE             IVC RV Basal diam:  3.20 cm     IVC diam: 0.90 cm RV Mid diam:    2.60 cm RV S prime:     14.40 cm/s TAPSE (M-mode): 2.1 cm LEFT ATRIUM             Index        RIGHT ATRIUM          Index LA diam:        2.40 cm 1.30 cm/m   RA Area:     9.83 cm LA Vol (A2C):   19.1 ml 10.33 ml/m  RA Volume:   17.60 ml 9.52 ml/m LA Vol (A4C):   21.3 ml 11.52 ml/m LA Biplane Vol: 20.4 ml 11.04 ml/m  AORTIC VALVE                    PULMONIC VALVE AV Area (Vmax):    2.21 cm     PV Vmax:       0.59 m/s AV Area (Vmean):   2.00 cm     PV Peak grad:  1.4 mmHg AV Area (VTI):     2.31 cm AV Vmax:           134.00 cm/s AV Vmean:          89.700 cm/s AV VTI:            0.201 m AV Peak Grad:      7.2 mmHg AV Mean Grad:      4.0 mmHg LVOT Vmax:         94.30 cm/s LVOT Vmean:        57.200 cm/s LVOT VTI:          0.148 m LVOT/AV VTI ratio: 0.74  AORTA Ao Root diam: 2.60  cm Ao Asc diam:  4.30 cm MITRAL VALVE MV Area (PHT): 1.68 cm    SHUNTS MV Area VTI:   2.20 cm    Systemic VTI:  0.15 m MV Peak grad:  3.4 mmHg    Systemic Diam: 2.00 cm MV Mean grad:  1.0 mmHg MV Vmax:       0.92 m/s MV Vmean:      51.8 cm/s MV Decel Time: 451 msec MV E velocity: 40.60 cm/s MV A velocity: 93.20 cm/s MV E/A ratio:  0.44 Dalton McleanMD Electronically signed by Wilfred Lacy Signature Date/Time: 11/09/2023/1:35:17 PM    Final    CT ABDOMEN PELVIS W CONTRAST Result Date: 11/08/2023 CLINICAL DATA:  Abdominal pain EXAM: CT ABDOMEN AND PELVIS WITH CONTRAST TECHNIQUE: Multidetector CT imaging of the abdomen and pelvis was performed using the standard protocol following bolus administration of intravenous contrast. RADIATION DOSE REDUCTION: This exam was performed according to the departmental dose-optimization program which includes automated exposure control, adjustment of the mA and/or kV according to patient size and/or use of iterative reconstruction technique. CONTRAST:  60mL OMNIPAQUE IOHEXOL 350 MG/ML SOLN COMPARISON:  02/22/2023 FINDINGS: Lower chest: No acute abnormality Hepatobiliary: No focal hepatic abnormality. Gallbladder unremarkable. Pancreas: No focal abnormality or ductal dilatation. Spleen: No focal abnormality.  Normal size. Adrenals/Urinary Tract: Adrenal glands  normal. Stable cyst off the upper pole of the right kidney appears simple. No follow-up imaging recommended. No stones or hydronephrosis. There is nodularity noted along the anterior bladder wall. This has a similar appearance to prior study. Appearance is concerning for possible bladder cancer. Several small bladder wall diverticula noted. Stomach/Bowel: Normal appendix. Stomach, large and small bowel grossly unremarkable. Vascular/Lymphatic: Aortoiliac atherosclerosis. No evidence of aneurysm or adenopathy. Reproductive: No visible focal abnormality. Other: No free fluid or free air. Musculoskeletal: No acute bony  abnormality. IMPRESSION: Nodularity along the anterior bladder wall. Cannot exclude bladder cancer. Recommend urologic consultation. Aortoiliac atherosclerosis. Electronically Signed   By: Charlett Nose M.D.   On: 11/08/2023 23:50   CT Head Wo Contrast Result Date: 11/08/2023 CLINICAL DATA:  Altered level of consciousness, suspected sepsis EXAM: CT HEAD WITHOUT CONTRAST TECHNIQUE: Contiguous axial images were obtained from the base of the skull through the vertex without intravenous contrast. RADIATION DOSE REDUCTION: This exam was performed according to the departmental dose-optimization program which includes automated exposure control, adjustment of the mA and/or kV according to patient size and/or use of iterative reconstruction technique. COMPARISON:  01/03/2023 FINDINGS: Brain: No acute infarct or hemorrhage. Stable hypodensities within the periventricular white matter compatible with chronic small vessel ischemic changes. Lateral ventricles and midline structures are unremarkable. No acute extra-axial fluid collections. No mass effect. Vascular: No hyperdense vessel or unexpected calcification. Skull: Normal. Negative for fracture or focal lesion. Sinuses/Orbits: No acute finding. Other: None. IMPRESSION: 1. Stable head CT, no acute intracranial process. Electronically Signed   By: Sharlet Salina M.D.   On: 11/08/2023 23:45   DG Chest 2 View Result Date: 11/08/2023 CLINICAL DATA:  Suspected sepsis EXAM: CHEST - 2 VIEW COMPARISON:  CT and radiograph 02/22/2023 FINDINGS: Stable cardiomediastinal silhouette. Loop recorder. Scarring in the left mid lung laterally. No pleural effusion or pneumothorax. Shrapnel overlying the left upper chest/axilla. IMPRESSION: No active cardiopulmonary disease. Electronically Signed   By: Minerva Fester M.D.   On: 11/08/2023 21:10     Labs:   Basic Metabolic Panel: Recent Labs  Lab 11/08/23 2030 11/09/23 0513 11/10/23 0530 11/11/23 0353 11/14/23 0337  NA 137  136 137 133* 134*  K 4.7 4.4 4.2 3.6 4.4  CL 104 106 109 105 104  CO2 22 15* 16* 21* 20*  GLUCOSE 99 81 87 105* 117*  BUN 34* 35* 36* 35* 25*  CREATININE 2.01* 2.02* 1.67* 1.67* 1.32*  CALCIUM 9.1 8.0* 7.7* 8.0* 8.6*  MG  --   --  1.6* 1.7  --    GFR Estimated Creatinine Clearance: 42.2 mL/min (A) (by C-G formula based on SCr of 1.32 mg/dL (H)). Liver Function Tests: Recent Labs  Lab 11/08/23 2030 11/09/23 0513  AST 104* 92*  ALT 29 26  ALKPHOS 86 68  BILITOT 0.9 0.8  PROT 8.3* 7.1  ALBUMIN 4.0 3.4*   Recent Labs  Lab 11/08/23 2200  LIPASE 53*   No results for input(s): "AMMONIA" in the last 168 hours. Coagulation profile Recent Labs  Lab 11/08/23 2030  INR 1.0    CBC: Recent Labs  Lab 11/08/23 2030 11/09/23 0513 11/10/23 0530 11/11/23 0353 11/14/23 0337  WBC 6.2 6.0 5.2 5.3 7.6  NEUTROABS 3.9  --   --   --   --   HGB 13.3 11.8* 11.8* 11.0* 10.5*  HCT 42.1 39.2 39.5 33.9* 32.1*  MCV 85.4 89.7 90.8 84.5 83.4  PLT 204 187 152 157 210   Cardiac Enzymes: Recent Labs  Lab 11/08/23 2200 11/10/23 0530  CKTOTAL 3,480* 1,692*   BNP: Invalid input(s): "POCBNP" CBG: Recent Labs  Lab 11/13/23 0731 11/13/23 1112 11/13/23 1608 11/13/23 2201 11/14/23 0733  GLUCAP 109* 113* 117* 183* 114*   D-Dimer No results for input(s): "DDIMER" in the last 72 hours. Hgb A1c No results for input(s): "HGBA1C" in the last 72 hours. Lipid Profile No results for input(s): "CHOL", "HDL", "LDLCALC", "TRIG", "CHOLHDL", "LDLDIRECT" in the last 72 hours. Thyroid function studies No results for input(s): "TSH", "T4TOTAL", "T3FREE", "THYROIDAB" in the last 72 hours.  Invalid input(s): "FREET3" Anemia work up No results for input(s): "VITAMINB12", "FOLATE", "FERRITIN", "TIBC", "IRON", "RETICCTPCT" in the last 72 hours. Microbiology Recent Results (from the past 240 hours)  Culture, blood (Routine x 2)     Status: None   Collection Time: 11/08/23  8:30 PM   Specimen: BLOOD   Result Value Ref Range Status   Specimen Description BLOOD RIGHT ANTECUBITAL  Final   Special Requests   Final    BOTTLES DRAWN AEROBIC AND ANAEROBIC Blood Culture adequate volume   Culture   Final    NO GROWTH 5 DAYS Performed at [facility], [address] [place], [place] [address]    Report Status 11/13/2023 FINAL  Final  Culture, blood (Routine x 2)     Status: None   Collection Time: 11/08/23  8:32 PM   Specimen: BLOOD  Result Value Ref Range Status   Specimen Description BLOOD LEFT ANTECUBITAL  Final   Special Requests   Final    BOTTLES DRAWN AEROBIC AND ANAEROBIC Blood Culture adequate volume   Culture   Final    NO GROWTH 5 DAYS Performed at [facility], [address] [place], [place] [address]    Report Status 11/13/2023 FINAL  Final  Resp panel by RT-PCR (RSV, Flu A&B, Covid) Anterior Nasal Swab     Status: Abnormal   Collection Time: 11/08/23  9:46 PM   Specimen: Anterior Nasal Swab  Result Value Ref Range Status   SARS Coronavirus 2 by RT PCR NEGATIVE NEGATIVE Final   Influenza A by PCR POSITIVE (A) NEGATIVE Final   Influenza B by PCR NEGATIVE NEGATIVE Final    Comment: (NOTE) The Xpert Xpress SARS-CoV-2/FLU/RSV plus assay is intended as an aid in the diagnosis of influenza from Nasopharyngeal swab specimens and should not be used as a sole basis for treatment. Nasal washings and aspirates are unacceptable for Xpert Xpress SARS-CoV-2/FLU/RSV testing.  Fact Sheet for Patients: BloggerCourse.com  Fact Sheet for Healthcare Providers: SeriousBroker.it  This test is not yet approved or cleared by the [place] FDA and has been authorized for detection and/or diagnosis of SARS-CoV-2 by FDA under an Emergency Use Authorization (EUA). This EUA will remain in effect (meaning this test can be used) for the duration of the COVID-19 declaration under Section 564(b)(1) of the Act, 21 U.S.C. section  360bbb-3(b)(1), unless the authorization is terminated or revoked.     Resp Syncytial Virus by PCR NEGATIVE NEGATIVE Final    Comment: (NOTE) Fact Sheet for Patients: BloggerCourse.com  Fact Sheet for Healthcare Providers: SeriousBroker.it  This test is not yet approved or cleared by the [place] FDA and has been authorized for detection and/or diagnosis of SARS-CoV-2 by FDA under an Emergency Use Authorization (EUA). This EUA will remain in effect (meaning this test can be used) for the duration of the COVID-19 declaration under Section 564(b)(1) of the Act, 21 U.S.C. section 360bbb-3(b)(1), unless  the authorization is terminated or revoked.  Performed at [facility], [address], [place], [place] [address]   Urine Culture     Status: Abnormal   Collection Time: 11/09/23 12:13 AM   Specimen: Urine, Random  Result Value Ref Range Status   Specimen Description URINE, RANDOM  Final   Special Requests   Final    NONE Reflexed from [id number] Performed at [facility], [address] [place], [place] [address]    Culture 40,000 COLONIES/mL ENTEROCOCCUS FAECALIS (A)  Final   Report Status 11/11/2023 FINAL  Final   Organism ID, Bacteria ENTEROCOCCUS FAECALIS (A)  Final      Susceptibility   Enterococcus faecalis - MIC*    AMPICILLIN <=2 SENSITIVE Sensitive     NITROFURANTOIN <=16 SENSITIVE Sensitive     VANCOMYCIN 1 SENSITIVE Sensitive     * 40,000 COLONIES/mL ENTEROCOCCUS FAECALIS     Discharge Instructions:   Discharge Instructions     Call MD for:  severe uncontrolled pain   Complete by: As directed    Call MD for:  temperature >100.4   Complete by: As directed    Diet - low sodium heart healthy   Complete by: As directed    Discharge instructions   Complete by: As directed    Follow-up with your primary care provider  at the skilled nursing facility in 3-5 days. Check blood work at that time.   Seek medical attention for worsening symptoms.   Complete the course of  antibiotic.  Follow-up with urology as has been scheduled.   Increase activity slowly   Complete by: As directed       Allergies as of 11/14/2023   No Known Allergies      Medication List     TAKE these medications    albuterol 108 (90 Base) MCG/ACT inhaler Commonly known as: ProAir HFA INHALE 2 PUFFS INTO THE LUNGS EVERY 6 (SIX) HOURS AS NEEDED FOR WHEEZING. What changed:  how much to take how to take this when to take this reasons to take this additional instructions   allopurinol 100 MG tablet Commonly known as: ZYLOPRIM Take 2 tablets (200 mg total) by mouth daily.   amoxicillin 500 MG capsule Commonly known as: AMOXIL Take 1 capsule (500 mg total) by mouth every 12 (twelve) hours for 3 days.   aspirin EC 81 MG tablet Commonly known as: CVS Aspirin Low Dose Take 1 tablet (81 mg total) by mouth daily. Swallow whole.   atorvastatin 20 MG tablet Commonly known as: LIPITOR TAKE 1 TABLET BY MOUTH EVERY DAY What changed: when to take this   cetirizine 10 MG tablet Commonly known as: ZYRTEC Take 10 mg by mouth daily as needed for allergies.   diclofenac Sodium 1 % Gel Commonly known as: VOLTAREN Apply 4 g topically 4 (four) times daily as needed (pain).   fluticasone-salmeterol 250-50 MCG/ACT Aepb Commonly known as: ADVAIR Inhale 1 puff into the lungs in the morning and at bedtime.   gabapentin 300 MG capsule Commonly known as: NEURONTIN Take 1 capsule (300 mg total) by mouth 2 (two) times daily.   Gemtesa 75 MG Tabs Generic drug: Vibegron Take 75 mg by mouth daily.   guaiFENesin-dextromethorphan 100-10 MG/5ML syrup Commonly known as: ROBITUSSIN DM Take 5 mLs by mouth every 4 (four) hours as needed for cough (chest congestion).   lisinopril 20 MG tablet Commonly known as: ZESTRIL Take 1 tablet (20 mg total) by mouth daily.  montelukast 10 MG tablet Commonly known as:  SINGULAIR Take 10 mg by mouth daily.   omeprazole 20 MG capsule Commonly known as: PRILOSEC Take 1 capsule (20 mg total) by mouth daily.   tiZANidine 4 MG tablet Commonly known as: ZANAFLEX Take 4 mg by mouth 2 (two) times daily as needed for muscle spasms.   Vitamin D (Ergocalciferol) 1.25 MG (50000 UNIT) Caps capsule Commonly known as: DRISDOL Take 50,000 Units by mouth every Monday.        Contact information for follow-up providers     [facility] Follow up on 11/18/2023.   Why: 9;30 am Contact information: [address] [place] [address] [phone]             Contact information for after-discharge care     Destination     [facility] Preferred SNF .   Service: Skilled Nursing Contact information: [address] [place] [address] [phone]                      Time coordinating discharge: 39 minutes  Signed:  Avonlea Sima  [facility] 11/14/2023, 10:00 AM

## 2023-11-14 NOTE — TOC Transition Note (Addendum)
 Transition of Care [facility]) - Discharge Note   Patient Details  Name: Luis Carter MRN: [id number] Date of Birth: 1945-12-18  Transition of Care [facility]) CM/SW Contact:  Patrice Paradise, LCSW Phone Number: 11/14/2023, 10:16 AM   Clinical Narrative:    Patient will DC to:?[facility] Anticipated DC date:?11/14/2023 Family notified:?Louise/ left message Transport by: Sharin Mons   Per MD patient ready for DC to [facility] RN, patient, patient's family, and facility notified of DC. Discharge Summary sent to facility. RN given number for report [phone]. DC packet on chart. Ambulance transport requested for patient.   CSW signing off.   Judd Lien, [place] [phone]    Final next level of care: Skilled Nursing Facility Barriers to Discharge: Barriers Resolved   Patient Goals and CMS Choice            Discharge Placement              Patient chooses bed at: [facility] Patient to be transferred to facility by: [facility] Name of family member notified: Sallye Ober Patient and family notified of of transfer: 11/14/23  Discharge Plan and Services Additional resources added to the After Visit Summary for   In-house Referral: Clinical Social Work                                   Social Drivers of Health (SDOH) Interventions SDOH Screenings   Food Insecurity: No Food Insecurity (11/10/2023)  Housing: Low Risk  (11/10/2023)  Transportation Needs: No Transportation Needs (11/10/2023)  Utilities: Not At Risk (11/10/2023)  Social Connections: Socially Isolated (11/10/2023)  Tobacco Use: Medium Risk (11/08/2023)     Readmission Risk Interventions    09/19/2021    2:31 PM  Readmission Risk Prevention Plan  Transportation Screening Complete  PCP or Specialist Appt within 3-5 Days Complete  HRI or Home Care Consult Complete  Social Work Consult for Recovery Care Planning/Counseling Patient refused  Palliative Care Screening Not Applicable   Medication Review Oceanographer) Complete

## 2023-11-26 NOTE — Progress Notes (Deleted)
  Electrophysiology Office Follow up Visit Note:    Date:  11/26/2023   ID:  [name], DOB [birth year], MRN [id number]  PCP:  Fleet Contras, MD  [facility] Cardiologist:  None  [facility] Electrophysiologist:  Lanier Prude, MD    Interval History:     Luis Carter is a 78 y.o. male who presents for a follow up visit.   I last saw the patient March 30, 2023 for sinus bradycardia.  We put a loop recorder in at that visit given history of presyncope.        Past medical, surgical, social and family history were reviewed.  ROS:   Please see the history of present illness.    All other systems reviewed and are negative.  EKGs/Labs/Other Studies Reviewed:    The following studies were reviewed today:  November 09, 2023 echo EF 60% RV normal No significant valvular disease        Physical Exam:    VS:  There were no vitals taken for this visit.    Wt Readings from Last 3 Encounters:  11/08/23 179 lb 14.3 oz (81.6 kg)  03/30/23 179 lb 12.8 oz (81.6 kg)  02/22/23 167 lb 11.2 oz (76.1 kg)     GEN: no distress CARD: RRR, No MRG RESP: No IWOB. CTAB.      ASSESSMENT:    1. Near syncope   2. Sinus bradycardia   3. Primary hypertension    PLAN:    In order of problems listed above:  #Presyncope #Sinus bradycardia The patient has a history of presyncope but has not had a recurrent episode of true syncope.  Loop recorder monitoring has showed no abnormal heart rhythms.  #Hypertension *** goal today.  Recommend checking blood pressures 1-2 times per week at home and recording the values.  Recommend bringing these recordings to the primary care physician.   Follow-up with EP on an as needed basis       Signed, Steffanie Dunn, MD, [facility], [facility] 11/26/2023 2:19 PM    Electrophysiology [facility]

## 2023-11-27 ENCOUNTER — Ambulatory Visit: Payer: 59 | Attending: Internal Medicine | Admitting: Cardiology

## 2023-11-27 DIAGNOSIS — R55 Syncope and collapse: Secondary | ICD-10-CM

## 2023-11-27 DIAGNOSIS — R001 Bradycardia, unspecified: Secondary | ICD-10-CM

## 2023-11-27 DIAGNOSIS — I10 Essential (primary) hypertension: Secondary | ICD-10-CM

## 2023-12-04 NOTE — Progress Notes (Signed)
 Carelink Summary Report / Loop Recorder

## 2023-12-07 ENCOUNTER — Ambulatory Visit: Payer: 59 | Attending: Cardiology

## 2023-12-07 DIAGNOSIS — I429 Cardiomyopathy, unspecified: Secondary | ICD-10-CM | POA: Diagnosis not present

## 2023-12-07 LAB — CUP PACEART REMOTE DEVICE CHECK
Date Time Interrogation Session: 20250413232111
Implantable Pulse Generator Implant Date: 20240805

## 2024-01-11 ENCOUNTER — Ambulatory Visit (INDEPENDENT_AMBULATORY_CARE_PROVIDER_SITE_OTHER): Payer: 59

## 2024-01-11 DIAGNOSIS — I429 Cardiomyopathy, unspecified: Secondary | ICD-10-CM

## 2024-01-11 DIAGNOSIS — R55 Syncope and collapse: Secondary | ICD-10-CM

## 2024-01-11 LAB — CUP PACEART REMOTE DEVICE CHECK
Date Time Interrogation Session: 20250518233102
Implantable Pulse Generator Implant Date: 20240805

## 2024-01-12 ENCOUNTER — Ambulatory Visit: Payer: Self-pay | Admitting: Cardiology

## 2024-01-21 NOTE — Progress Notes (Signed)
 Carelink Summary Report / Loop Recorder

## 2024-02-15 ENCOUNTER — Ambulatory Visit: Payer: Self-pay | Admitting: Cardiology

## 2024-02-15 ENCOUNTER — Ambulatory Visit (INDEPENDENT_AMBULATORY_CARE_PROVIDER_SITE_OTHER): Payer: 59

## 2024-02-15 DIAGNOSIS — I429 Cardiomyopathy, unspecified: Secondary | ICD-10-CM

## 2024-02-15 LAB — CUP PACEART REMOTE DEVICE CHECK
Date Time Interrogation Session: 20250622234756
Implantable Pulse Generator Implant Date: 20240805

## 2024-02-29 NOTE — Progress Notes (Signed)
 Carelink Summary Report / Loop Recorder

## 2024-03-04 ENCOUNTER — Emergency Department (HOSPITAL_COMMUNITY)

## 2024-03-04 ENCOUNTER — Emergency Department (HOSPITAL_COMMUNITY)
Admission: EM | Admit: 2024-03-04 | Discharge: 2024-03-04 | Disposition: A | Discharge status: Home / Self Care | Attending: Emergency Medicine | Admitting: Emergency Medicine

## 2024-03-04 ENCOUNTER — Other Ambulatory Visit: Payer: Self-pay

## 2024-03-04 DIAGNOSIS — R531 Weakness: Secondary | ICD-10-CM | POA: Diagnosis present

## 2024-03-04 DIAGNOSIS — E860 Dehydration: Secondary | ICD-10-CM | POA: Diagnosis not present

## 2024-03-04 DIAGNOSIS — E875 Hyperkalemia: Secondary | ICD-10-CM | POA: Diagnosis not present

## 2024-03-04 DIAGNOSIS — Z7982 Long term (current) use of aspirin: Secondary | ICD-10-CM | POA: Diagnosis not present

## 2024-03-04 LAB — CBC
HCT: 35.1 % — ABNORMAL LOW (ref 39.0–52.0)
Hemoglobin: 10.8 g/dL — ABNORMAL LOW (ref 13.0–17.0)
MCH: 27.2 pg (ref 26.0–34.0)
MCHC: 30.8 g/dL (ref 30.0–36.0)
MCV: 88.4 fL (ref 80.0–100.0)
Platelets: 193 K/uL (ref 150–400)
RBC: 3.97 MIL/uL — ABNORMAL LOW (ref 4.22–5.81)
RDW: 15.9 % — ABNORMAL HIGH (ref 11.5–15.5)
WBC: 6.4 K/uL (ref 4.0–10.5)
nRBC: 0 % (ref 0.0–0.2)

## 2024-03-04 LAB — BASIC METABOLIC PANEL WITH GFR
Anion gap: 7 (ref 5–15)
BUN: 36 mg/dL — ABNORMAL HIGH (ref 8–23)
CO2: 20 mmol/L — ABNORMAL LOW (ref 22–32)
Calcium: 8.8 mg/dL — ABNORMAL LOW (ref 8.9–10.3)
Chloride: 111 mmol/L (ref 98–111)
Creatinine, Ser: 1.78 mg/dL — ABNORMAL HIGH (ref 0.61–1.24)
GFR, Estimated: 38 mL/min — ABNORMAL LOW (ref >60)
Glucose, Bld: 97 mg/dL (ref 70–99)
Potassium: 5.3 mmol/L — ABNORMAL HIGH (ref 3.5–5.1)
Sodium: 138 mmol/L (ref 135–145)

## 2024-03-04 LAB — TROPONIN I (HIGH SENSITIVITY)
Troponin I (High Sensitivity): 12 ng/L (ref <18)
Troponin I (High Sensitivity): 14 ng/L (ref <18)

## 2024-03-04 MED ORDER — SODIUM CHLORIDE 0.9 % IV BOLUS
500.0000 mL | Freq: Once | INTRAVENOUS | Status: AC
  Administered 2024-03-04: 500 mL via INTRAVENOUS

## 2024-03-04 NOTE — Discharge Instructions (Signed)
 Please stay well-hydrated, and be sure to follow-up with your physician in the coming days.  Do not hesitate to return here for concerning changes in your condition.

## 2024-03-04 NOTE — ED Triage Notes (Signed)
 PT states he was seen by home health nurse and told to come to the ED due to having a low heart rate.
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
## 2024-03-04 NOTE — ED Triage Notes (Signed)
 Pt to ED , reports was told to come to ED by home health nurse for weak heart Pt currently voicing no complaints. Hx CHF.

## 2024-03-04 NOTE — ED Triage Notes (Signed)
 The pt was seen by a home health nurse earlier today and they told the family that the pt needed to come to the hospital  hx of chf

## 2024-03-17 ENCOUNTER — Ambulatory Visit

## 2024-03-17 ENCOUNTER — Ambulatory Visit: Payer: Self-pay | Admitting: Cardiology

## 2024-03-17 DIAGNOSIS — I429 Cardiomyopathy, unspecified: Secondary | ICD-10-CM

## 2024-03-17 LAB — CUP PACEART REMOTE DEVICE CHECK
Date Time Interrogation Session: 20250723234216
Implantable Pulse Generator Implant Date: 20240805

## 2024-03-21 NOTE — Progress Notes (Signed)
 Carelink Summary Report / Loop Recorder

## 2024-03-25 ENCOUNTER — Telehealth: Payer: Self-pay

## 2024-03-25 NOTE — Telephone Encounter (Signed)
 Alert received from [employer]:  Alert remote transmission:  Pause, brady 2 pause events 7/31 @ 10:40 & 10:41, 3sec in duration per device, EGM's c/w sinus pause 1 brady event @ 10:41, duration 6sec, HR<30, EGM c/w SB and junctional - route to triage  Left message requesting call back.

## 2024-03-28 NOTE — Telephone Encounter (Signed)
 Spoke with patient.   States he remembers feeling lightheaded like he could pass out. Denies actually passing out but did become weak during the episode and felt a little chest tightness. Recently in the ER in past weeks with weakness and dehydration.  He doesn't drink a lot of fluids because it makes him go to the bathroom constantly but is trying to stay hydrated.   No other changes in health or medications.    Forwarding to provider for review.

## 2024-03-30 NOTE — Telephone Encounter (Signed)
 Called and left message regarding scheduling F/U per Dr. Cindie to discuss potential PPM after recent device alert. Device clinic number given and requested call back to confirm appointment date and time.

## 2024-03-30 NOTE — Telephone Encounter (Signed)
 Patient sister returned call to the device clinic. Confirmed appointment date and time for  August 27th, 2025 @ 1:45 pm to discuss potential PPM.  Address given for appointment location and informed to call device clinic back with any further questions or concerns. Patient verbalized understanding and is appreciative for call.

## 2024-04-18 ENCOUNTER — Ambulatory Visit

## 2024-04-18 DIAGNOSIS — I429 Cardiomyopathy, unspecified: Secondary | ICD-10-CM

## 2024-04-19 LAB — CUP PACEART REMOTE DEVICE CHECK
Date Time Interrogation Session: 20250823232529
Implantable Pulse Generator Implant Date: 20240805

## 2024-04-19 NOTE — Progress Notes (Unsigned)
  Electrophysiology Office Follow up Visit Note:    Date:  04/20/2024   ID:  Luis Carter, DOB 05-22-1946, MRN [id number]  PCP:  Shelda Atlas, MD  [facility] Cardiologist:  None  [facility] Electrophysiologist:  OLE ONEIDA HOLTS, MD    Interval History:     Luis Carter is a 78 y.o. male who presents for a follow up visit.   I last saw the patient in person March 30, 2019 for for sinus bradycardia.  A loop recorder was implanted at that time. We received an alert from his loop recorder March 25, 2024 for 2 pause events.  The longest pause was 3 seconds. There was also a bradycardic episode lasting over 2 minutes with heart rates less than 30 bpm.  The patient was symptomatic at the time and reported presyncope.  He presents today to discuss loop recorder results and possible pacemaker implant.  He is with his wife today in clinic.  He reports longstanding lightheadedness and dizziness but no syncopal episodes.  No near syncope either.  He reports that pain is his biggest concern.  He is having jolts of pain in his left and right upper extremities.  Sometimes they go to his lower extremities.  He has not seen his primary care yet about this.        Past medical, surgical, social and family history were reviewed.  ROS:   Please see the history of present illness.    All other systems reviewed and are negative.  EKGs/Labs/Other Studies Reviewed:    The following studies were reviewed today:  November 09, 2023 echo EF 60-65 RV normal        Physical Exam:    VS:  BP (!) 161/71   Pulse 60   Ht 5' 3 (1.6 m)   Wt 160 lb (72.6 kg)   SpO2 97%   BMI 28.34 kg/m     Wt Readings from Last 3 Encounters:  04/20/24 160 lb (72.6 kg)  03/04/24 179 lb 14.3 oz (81.6 kg)  11/08/23 179 lb 14.3 oz (81.6 kg)     GEN: no distress.  Elderly CARD: RRR, No MRG RESP: No IWOB. CTAB.      ASSESSMENT:    1. Near syncope   2. Sinus bradycardia   3. Symptomatic  bradycardia    PLAN:    In order of problems listed above:  #Symptomatic bradycardia Recurrent symptomatic episodes of bradycardia.  I discussed treatment strategies with the patient including permanent pacemaker implant.  I think a pacemaker is indicated at this time.  There are no other reversible causes.  Plan for [employer]tronic dual-chamber permanent pacemaker with left bundle area lead.  Risks, benefits, alternatives to PPM implantation were discussed in detail with the patient today. The patient understands that the risks include but are not limited to bleeding, infection, pneumothorax, perforation, tamponade, vascular damage, renal failure, MI, stroke, death, and lead dislodgement and wishes to proceed.   If we end up proceeding, he will need to hold his aspirin  for 5 days prior to the procedure.  #Pain The patient would like to see his primary care physician to discuss his upper extremity pain before proceeding with pacemaker implant which I think is reasonable.  We will help him facilitate this appointment.  Follow-up with the EP APP in 6 to 8 weeks.  Signed, OLE HOLTS, MD, [facility], [facility] 04/20/2024 2:02 PM    Electrophysiology [facility]

## 2024-04-20 ENCOUNTER — Encounter: Payer: Self-pay | Admitting: Cardiology

## 2024-04-20 ENCOUNTER — Ambulatory Visit: Payer: Self-pay | Admitting: Cardiology

## 2024-04-20 ENCOUNTER — Ambulatory Visit: Attending: Cardiology | Admitting: Cardiology

## 2024-04-20 VITALS — BP 161/71 | HR 60 | Ht 63.0 in | Wt 160.0 lb

## 2024-04-20 DIAGNOSIS — R55 Syncope and collapse: Secondary | ICD-10-CM | POA: Diagnosis not present

## 2024-04-20 DIAGNOSIS — R001 Bradycardia, unspecified: Secondary | ICD-10-CM | POA: Diagnosis not present

## 2024-04-20 NOTE — Patient Instructions (Signed)
 Medication Instructions:  Your physician recommends that you continue on your current medications as directed. Please refer to the Current Medication list given to you today.  *If you need a refill on your cardiac medications before your next appointment, please call your pharmacy*  Follow-Up: At [facility], you and your health needs are our priority.  As part of our continuing mission to provide you with exceptional heart care, our providers are all part of one team.  This team includes your primary Cardiologist (physician) and Advanced Practice Providers or APPs (Physician Assistants and Nurse Practitioners) who all work together to provide you with the care you need, when you need it.  Your next appointment:    6-8 weeks  Provider:   You will see one of the following Advanced Practice Providers on your designated Care Team:   Charlies Arthur, PA-C Michael Andy Tillery, PA-C Suzann Riddle, NP Daphne Barrack, NP  Follow up with PCP as soon as possible

## 2024-05-11 NOTE — Progress Notes (Signed)
 Remote Loop Recorder Transmission

## 2024-05-19 ENCOUNTER — Other Ambulatory Visit: Payer: Self-pay

## 2024-05-19 ENCOUNTER — Ambulatory Visit

## 2024-05-19 ENCOUNTER — Encounter (HOSPITAL_COMMUNITY): Payer: Self-pay

## 2024-05-19 ENCOUNTER — Encounter (HOSPITAL_COMMUNITY): Payer: Self-pay | Admitting: Emergency Medicine

## 2024-05-19 ENCOUNTER — Emergency Department (HOSPITAL_COMMUNITY)
Admission: EM | Admit: 2024-05-19 | Discharge: 2024-05-19 | Disposition: A | Discharge status: Home / Self Care | Attending: Emergency Medicine | Admitting: Emergency Medicine

## 2024-05-19 ENCOUNTER — Ambulatory Visit (HOSPITAL_COMMUNITY)
Admission: EM | Admit: 2024-05-19 | Discharge: 2024-05-19 | Disposition: A | Discharge status: Home / Self Care | Attending: Family Medicine | Admitting: Family Medicine

## 2024-05-19 ENCOUNTER — Emergency Department (HOSPITAL_COMMUNITY)

## 2024-05-19 DIAGNOSIS — I509 Heart failure, unspecified: Secondary | ICD-10-CM | POA: Diagnosis not present

## 2024-05-19 DIAGNOSIS — I429 Cardiomyopathy, unspecified: Secondary | ICD-10-CM

## 2024-05-19 DIAGNOSIS — M542 Cervicalgia: Secondary | ICD-10-CM | POA: Diagnosis not present

## 2024-05-19 DIAGNOSIS — I110 Hypertensive heart disease with heart failure: Secondary | ICD-10-CM | POA: Diagnosis present

## 2024-05-19 DIAGNOSIS — R600 Localized edema: Secondary | ICD-10-CM | POA: Insufficient documentation

## 2024-05-19 DIAGNOSIS — R252 Cramp and spasm: Secondary | ICD-10-CM | POA: Diagnosis not present

## 2024-05-19 DIAGNOSIS — Z79899 Other long term (current) drug therapy: Secondary | ICD-10-CM | POA: Insufficient documentation

## 2024-05-19 DIAGNOSIS — I10 Essential (primary) hypertension: Secondary | ICD-10-CM

## 2024-05-19 DIAGNOSIS — E875 Hyperkalemia: Secondary | ICD-10-CM | POA: Insufficient documentation

## 2024-05-19 DIAGNOSIS — Z7982 Long term (current) use of aspirin: Secondary | ICD-10-CM | POA: Diagnosis not present

## 2024-05-19 LAB — POCT URINALYSIS DIP (MANUAL ENTRY)
Bilirubin, UA: NEGATIVE
Blood, UA: NEGATIVE
Glucose, UA: NEGATIVE mg/dL
Ketones, POC UA: NEGATIVE mg/dL
Leukocytes, UA: NEGATIVE
Nitrite, UA: NEGATIVE
Protein Ur, POC: NEGATIVE mg/dL
Spec Grav, UA: 1.015 (ref 1.010–1.025)
Urobilinogen, UA: 1 U/dL
pH, UA: 7 (ref 5.0–8.0)

## 2024-05-19 LAB — BASIC METABOLIC PANEL WITH GFR
Anion gap: 8 (ref 5–15)
BUN: 27 mg/dL — ABNORMAL HIGH (ref 8–23)
CO2: 27 mmol/L (ref 22–32)
Calcium: 8.6 mg/dL — ABNORMAL LOW (ref 8.9–10.3)
Chloride: 102 mmol/L (ref 98–111)
Creatinine, Ser: 1.61 mg/dL — ABNORMAL HIGH (ref 0.61–1.24)
GFR, Estimated: 43 mL/min — ABNORMAL LOW (ref >60)
Glucose, Bld: 93 mg/dL (ref 70–99)
Potassium: 5.3 mmol/L — ABNORMAL HIGH (ref 3.5–5.1)
Sodium: 137 mmol/L (ref 135–145)

## 2024-05-19 LAB — CBC WITH DIFFERENTIAL/PLATELET
Abs Immature Granulocytes: 0.02 K/uL (ref 0.00–0.07)
Basophils Absolute: 0 K/uL (ref 0.0–0.1)
Basophils Relative: 1 %
Eosinophils Absolute: 0.4 K/uL (ref 0.0–0.5)
Eosinophils Relative: 6 %
HCT: 39.7 % (ref 39.0–52.0)
Hemoglobin: 11.7 g/dL — ABNORMAL LOW (ref 13.0–17.0)
Immature Granulocytes: 0 %
Lymphocytes Relative: 34 %
Lymphs Abs: 2.1 K/uL (ref 0.7–4.0)
MCH: 27.1 pg (ref 26.0–34.0)
MCHC: 29.5 g/dL — ABNORMAL LOW (ref 30.0–36.0)
MCV: 91.9 fL (ref 80.0–100.0)
Monocytes Absolute: 0.6 K/uL (ref 0.1–1.0)
Monocytes Relative: 9 %
Neutro Abs: 3.1 K/uL (ref 1.7–7.7)
Neutrophils Relative %: 50 %
Platelets: 218 K/uL (ref 150–400)
RBC: 4.32 MIL/uL (ref 4.22–5.81)
RDW: 15.9 % — ABNORMAL HIGH (ref 11.5–15.5)
WBC: 6.2 K/uL (ref 4.0–10.5)
nRBC: 0 % (ref 0.0–0.2)

## 2024-05-19 LAB — CUP PACEART REMOTE DEVICE CHECK
Date Time Interrogation Session: 20250924233934
Implantable Pulse Generator Implant Date: 20240805

## 2024-05-19 LAB — BRAIN NATRIURETIC PEPTIDE: B Natriuretic Peptide: 37.7 pg/mL (ref 0.0–100.0)

## 2024-05-19 LAB — TROPONIN I (HIGH SENSITIVITY): Troponin I (High Sensitivity): 17 ng/L (ref <18)

## 2024-05-19 MED ORDER — METHOCARBAMOL 500 MG PO TABS
500.0000 mg | ORAL_TABLET | Freq: Once | ORAL | Status: AC
  Administered 2024-05-19: 500 mg via ORAL
  Filled 2024-05-19: qty 1

## 2024-05-19 NOTE — ED Provider Triage Note (Signed)
 Emergency Medicine Provider Triage Evaluation Note  Luis Carter , a 78 y.o. male  was evaluated in triage.  Pt complains of Hypertension.  He has a history of high blood pressure is taking his medication but was noted to be hypertensive with systolic pressures above 200 today.  He denies any active chest pain or shortness of breath.  He has some slight swelling in his lower extremities which is unchanged.  Review of Systems  Positive: High blood pressure Negative: fever  Physical Exam  BP (!) 214/77   Pulse (!) 46   Temp (!) 97.2 F (36.2 C) (Axillary)   Resp 16   Ht 5' 3 (1.6 m)   Wt 72.6 kg   SpO2 97%   BMI 28.34 kg/m  Gen:   Awake, no distress   Resp:  Normal effort  MSK:   Moves extremities without difficulty  Other:    Medical Decision Making  Medically screening exam initiated at 7:10 PM.  Appropriate orders placed.  Luis Carter was informed that the remainder of the evaluation will be completed by another provider, this initial triage assessment does not replace that evaluation, and the importance of remaining in the ED until their evaluation is complete.     Arloa Chroman, PA-C 05/19/24 1913

## 2024-05-19 NOTE — ED Triage Notes (Signed)
 Reports the nurse came to the home and reported increased blood pressure.  Patient complaining of cramping.  173/77, 170/80, and 176/75-written on a paper patient and wife brought to clinic.  Denies chest pain.  Reports arms and legs cramp
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
## 2024-05-19 NOTE — ED Notes (Signed)
 Instructed to place patient in treatment room.  Staff will see

## 2024-05-19 NOTE — ED Notes (Signed)
 Patient is being discharged from the Urgent Care and sent to the Emergency Department via pov . Per Dr Harding stephanie, patient is in need of higher level of care due to hypertension, weakness. Patient is aware and verbalizes understanding of plan of care.  Vitals:   05/19/24 1622 05/19/24 1730  BP: (!) 197/85 (!) 217/91  Pulse: (!) 52   Resp: 20   Temp: (!) 97.5 F (36.4 C)   SpO2: 99%

## 2024-05-19 NOTE — ED Provider Notes (Signed)
 [facility]    CSN: [id number] Arrival date & time: 05/19/24  1607      History   Chief Complaint No chief complaint on file.   HPI Luis Carter is a 78 y.o. male.   Patient presents today due to higher blood pressure than normal.  Patient has a history of hypertension for which he takes lisinopril  20 mg daily for.  Patient states that he takes it as directed and has not skipped today.  Patient states in the last week he has been experiencing bilateral muscle cramps in arms and legs and has been taking BC powder for pain.  Patient states that he took about 2 BC powders in the past week.  Home health aide took blood pressure at home today and advised him that his blood pressure was severely elevated and he needed to be seen in the ER right away.  Patient denies blurred vision, headache, chest pain, or shortness of breath in the last week.  EKG was performed and shows no significant changes from last EKG.  The history is provided by the patient.    Past Medical History:  Diagnosis Date   Acute exacerbation of chronic obstructive pulmonary disease (COPD) (HCC) 03/11/2021   Arthritis    left arm/shoulder; both legs (12/12/2015)   ASTHMA    since childhood   Cardiomyopathy (HCC) 01/25/2016   Echo 12/13/15 - Mild LVH, EF 45-50%, normal wall motion, grade 1 diastolic dysfunction, mild LAE, mild RVE, mild RAE   CHF (congestive heart failure) (HCC)    COPD 04/14/2007   Coronary artery disease    DIABETES MELLITUS, TYPE II 04/14/2007   GERD (gastroesophageal reflux disease)    GI bleed    Gout    High cholesterol    History of nuclear stress test    a.  Myoview  6/17: EF 54%, no ischemia   HYPERTENSION 04/14/2007   LEG PAIN, RIGHT 05/19/2008   LOW BACK PAIN 04/14/2007   Myocardial infarction (HCC)    ~ 2001/notes 09/13/2001 (03/22/2013)   PANCREATITIS, HX OF 04/14/2007   Pneumonia    twice (12/12/2015)   SEIZURE DISORDER 04/14/2007   used to have them when I  was young (03/22/2013)   Shortness of breath    related to asthma (03/22/2013)    Patient Active Problem List   Diagnosis Date Noted   Influenza A 11/09/2023   Sinus bradycardia 02/20/2023   Bradycardia 02/20/2023   UTI (urinary tract infection) 01/07/2023   Acute on chronic diastolic CHF (congestive heart failure) (HCC) 01/03/2023   Acute pyelonephritis 03/18/2022   Elevated troponin 03/18/2022   Chronic diastolic CHF (congestive heart failure) (HCC) 03/18/2022   Physical debility 09/17/2021   Acute right hip pain 09/16/2021   HLD (hyperlipidemia) 09/15/2021   COVID-19 virus infection 09/15/2021   SIRS (systemic inflammatory response syndrome) (HCC) 03/11/2021   Malnutrition of moderate degree 02/11/2021   Hyperkalemia 02/10/2021   Dyspnea 02/08/2021   Community acquired pneumonia of right upper lobe of lung    Respiratory failure with hypoxia and hypercapnia (HCC) 10/11/2018   Acute pain of left shoulder 10/15/2016   Pain in right leg 10/15/2016   Weakness of right leg 10/15/2016   Cardiomyopathy (HCC) 01/25/2016   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   Abnormal echocardiogram 12/13/2015   Near syncope 12/12/2015   Tobacco use disorder 09/07/2015   Gout 01/27/2012   Cocaine use 12/22/2011   Chest pain 12/18/2008   Type 2 diabetes mellitus with  hyperlipidemia (HCC) 04/14/2007   Hypertension 04/14/2007   Asthma 04/14/2007   COPD (chronic obstructive pulmonary disease) (HCC) 04/14/2007   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 Authorizing Provider  albuterol  (PROAIR  HFA) 108 (90 BASE) MCG/ACT inhaler INHALE 2 PUFFS INTO THE LUNGS EVERY 6 (SIX) HOURS AS NEEDED FOR WHEEZING. Patient taking differently: Inhale 2 puffs into the lungs every 6 (six) hours as needed for shortness of breath. 03/05/15   Jame Maude FALCON, MD  allopurinol  (ZYLOPRIM ) 100 MG tablet Take 2 tablets (200 mg total) by mouth daily. 03/05/15   Jame Maude FALCON, MD  aspirin  ([employer] ASPIRIN  LOW DOSE) 81 MG EC tablet Take 1 tablet (81 mg total) by mouth daily. Swallow whole. 03/16/21   Singh, Prashant K, MD  atorvastatin  (LIPITOR) 20 MG tablet TAKE 1 TABLET BY MOUTH EVERY DAY Patient taking differently: Take 20 mg by mouth at bedtime. 06/02/16   Jame Maude FALCON, MD  cetirizine  (ZYRTEC ) 10 MG tablet Take 10 mg by mouth daily as needed for allergies. 10/30/23   [provider]  diclofenac  Sodium (VOLTAREN ) 1 % GEL Apply 4 g topically 4 (four) times daily as needed (pain). 01/01/23   [provider]  fluticasone -salmeterol (ADVAIR ) 250-50 MCG/ACT AEPB Inhale 1 puff into the lungs in the morning and at bedtime. Patient not taking: Reported on 04/20/2024 02/13/21   Arrien, Mauricio Daniel, MD  gabapentin  (NEURONTIN ) 300 MG capsule Take 1 capsule (300 mg total) by mouth 2 (two) times daily. 01/09/23 04/20/24  Arrien, Elidia Sieving, MD  guaiFENesin -dextromethorphan  (ROBITUSSIN DM) 100-10 MG/5ML syrup Take 5 mLs by mouth every 4 (four) hours as needed for cough (chest congestion). 11/14/23   Pokhrel, Laxman, MD  lisinopril  (ZESTRIL ) 20 MG tablet Take 1 tablet (20 mg total) by mouth daily. 02/22/23   Jillian Buttery, MD  montelukast  (SINGULAIR ) 10 MG tablet Take 10 mg by mouth daily. 12/10/22   [provider]  omeprazole  (PRILOSEC) 20 MG capsule Take 1 capsule (20 mg total) by mouth daily. 03/05/15   Jame Maude FALCON, MD  tiZANidine  (ZANAFLEX ) 4 MG tablet Take 4 mg by mouth 2 (two) times daily as  needed for muscle spasms. 10/16/22   [provider]  Vibegron (GEMTESA) 75 MG TABS Take 75 mg by mouth daily.    [provider]  Vitamin D , Ergocalciferol , (DRISDOL ) 1.25 MG (50000 UNIT) CAPS capsule Take 50,000 Units by mouth every Monday.    [provider]    Family History Family History  Problem Relation Age of Onset   Heart disease Sister    Heart disease Brother    Heart disease Brother    Heart disease Brother    Heart disease Brother     Social History Social History   Tobacco Use   Smoking status: Former    Current packs/day: 0.00    Average packs/day: 0.3 packs/day for 57.0 years (14.3 ttl pk-yrs)    Types: Cigarettes    Start date: 11/24/1958    Quit date: 11/24/2015  Years since quitting: 8.4   Smokeless tobacco: Never  Vaping Use   Vaping status: Never Used  Substance Use Topics   Alcohol use: Not Currently    Comment: 12/12/2015 quit drinking years ago; never had problem w/it   Drug use: Not Currently    Types: Cocaine     Allergies   Patient has no known allergies.   Review of Systems Review of Systems   Physical Exam Triage Vital Signs ED Triage Vitals  Encounter Vitals Group     BP 05/19/24 1622 (!) 197/85     Girls Systolic BP Percentile --      Girls Diastolic BP Percentile --      Boys Systolic BP Percentile --      Boys Diastolic BP Percentile --      Pulse Rate 05/19/24 1622 (!) 52     Resp 05/19/24 1622 20     Temp 05/19/24 1622 (!) 97.5 F (36.4 C)     Temp Source 05/19/24 1622 Oral     SpO2 05/19/24 1622 99 %     Weight --      Height --      Head Circumference --      Peak Flow --      Pain Score 05/19/24 1619 2     Pain Loc --      Pain Education --      Exclude from Growth Chart --    No data found.  Updated Vital Signs BP (!) 197/85 (BP Location: Right Arm)   Pulse (!) 52   Temp (!) 97.5 F (36.4 C) (Oral)   Resp 20   SpO2 99%   Visual Acuity Right Eye Distance:   Left Eye  Distance:   Bilateral Distance:    Right Eye Near:   Left Eye Near:    Bilateral Near:     Physical Exam Vitals and nursing note reviewed.  Constitutional:      General: He is not in acute distress.    Appearance: Normal appearance. He is not ill-appearing, toxic-appearing or diaphoretic.  Eyes:     General: No scleral icterus. Cardiovascular:     Rate and Rhythm: Normal rate and regular rhythm.     Heart sounds: Normal heart sounds.  Pulmonary:     Breath sounds: Wheezing present.  Skin:    General: Skin is warm.  Neurological:     Mental Status: He is alert and oriented to person, place, and time.  Psychiatric:        Mood and Affect: Mood normal.        Behavior: Behavior normal.      UC Treatments / Results  Labs (all labs ordered are listed, but only abnormal results are displayed) Labs Reviewed  POCT URINALYSIS DIP (MANUAL ENTRY)    EKG   Radiology CUP PACEART REMOTE DEVICE CHECK Result Date: 05/19/2024 ILR summary report received. Battery status OK. Normal device function. No new symptom, tachy, brady, or pause episodes. No new AF episodes. Monthly summary reports and ROV/PRN AB, CVRS   Procedures Procedures (including critical care time)  Medications Ordered in UC Medications - No data to display  Initial Impression / Assessment and Plan / UC Course  I have reviewed the triage vital signs and the nursing notes.  Pertinent labs & imaging results that were available during my care of the patient were reviewed by me and considered in my medical decision making (see chart for details).     Sending  patient to ER due to elevated BP associated with muscle cramping, hx of heart failure, HTN, DM2,  and CKD. Urinalysis appeared unremarkable and EKG was unchanged from last EKG. Pt needs blood work and monitoring for improvement Final Clinical Impressions(s) / UC Diagnoses   Final diagnoses:  Muscle cramps  Uncontrolled hypertension   Discharge  Instructions   None    ED Prescriptions   None    PDMP not reviewed this encounter.   Andra Corean BROCKS, PA-C 05/19/24 1738

## 2024-05-19 NOTE — Discharge Instructions (Addendum)
 As we discussed, take your blood pressure twice a day at the same time everyday, as keep a log of the measurements using the form provided. This should be reviewed with your doctor in one week to determine if changes in your medication is needed.   Return to the ED with any new or concerning symptoms at any time.

## 2024-05-19 NOTE — ED Notes (Signed)
 Patient is being discharged from the Urgent Care and sent to the Emergency Department via personal opperated vehicle . Per Corean Endo PA, patient is in need of higher level of care due to hypertension. Patient is aware and verbalizes understanding of plan of care.  Vitals:   05/19/24 1622 05/19/24 1730  BP: (!) 197/85 (!) 217/91  Pulse: (!) 52   Resp: 20   Temp: (!) 97.5 F (36.4 C)   SpO2: 99%

## 2024-05-19 NOTE — ED Triage Notes (Signed)
 Pt presents to ED from home C/O HTN and generalized body cramping. Reports hx CHF.

## 2024-05-20 ENCOUNTER — Ambulatory Visit: Payer: Self-pay | Admitting: Cardiology

## 2024-05-23 NOTE — Progress Notes (Signed)
 Remote Loop Recorder Transmission

## 2024-05-26 NOTE — Progress Notes (Signed)
 Remote Loop Recorder Transmission

## 2024-05-31 NOTE — Progress Notes (Deleted)
  Electrophysiology Office Note:   Date:  05/31/2024  ID:  RAYHAN GROLEAU, DOB 06-28-1946, MRN [id number]  Primary Cardiologist: None Primary Heart Failure: None Electrophysiologist: OLE ONEIDA HOLTS, MD  {Click to update primary MD,subspecialty MD or APP then REFRESH:1}    History of Present Illness:   Luis Carter is a 78 y.o. male with h/o bradycardia, HFrEF, HTN, HLD, DM II, CKD 3b, cocaine use, seizures, tobacco abuse seen today for routine electrophysiology followup.   Seen in EP Clinic 03/25/24 after ILR alerted for 2 pauses events > longest lasting 3 seconds. Additional brady episode lasting 2 minutes. He reported pre-syncope symptoms. He was planned for dual chamber PPM with LBA lead at that time. He was having upper extremity pain and he wanted to see his PCP before proceeding with PPM implant.    Seen in ER on 05/19/24 for elevated blood pressure during home health check. Initial BP 197/85 at the UC.  She was referred to ER with SBP > 200 mmHg. BP improved in ER.  He was given robaxin  for muscle cramps.   Since last being seen in our clinic the patient reports doing ***.    He *** denies chest pain, palpitations, dyspnea, PND, orthopnea, nausea, vomiting, dizziness, syncope, edema, weight gain, or early satiety.   Review of systems complete and found to be negative unless listed in HPI.   EP Information / Studies Reviewed:    EKG is not ordered today. EKG from 05/20/24 reviewed which showed SB 49 bpm       Arrhythmia / AAD / Pertinent EP Studies Nola / Pauses    Device  MDT ILR 03/30/23 > implanted for sinus node dysfunction    Risk Assessment/Calculations:   {Does this patient have ATRIAL FIBRILLATION?:[id number]} No BP recorded.  {Refresh Note OR Click here to enter BP  :1}***        Physical Exam:   VS:  There were no vitals taken for this visit.   Wt Readings from Last 3 Encounters:  05/19/24 160 lb (72.6 kg)  04/20/24 160 lb (72.6 kg)  03/04/24 179 lb  14.3 oz (81.6 kg)     GEN: Well nourished, well developed in no acute distress NECK: No JVD; No carotid bruits CARDIAC: {EPRHYTHM:28826}, no murmurs, rubs, gallops RESPIRATORY:  Clear to auscultation without rales, wheezing or rhonchi  ABDOMEN: Soft, non-tender, non-distended EXTREMITIES:  No edema; No deformity   ASSESSMENT AND PLAN:    Symptomatic Bradycardia  Near Syncope -ILR review shows, presenting NSR ***, no new episodes of pauses *** -no reversible causes identified  -prior rec for PPM per Dr. HOLTS  -plan to hold ASA for 5 days prior to implant ***   Follow up with Dr. HOLTS {EPFOLLOW LE:71826}  Signed, Daphne Barrack, NP-C, AGACNP-BC [facility] - Electrophysiology  05/31/2024, 8:00 PM

## 2024-06-01 ENCOUNTER — Ambulatory Visit: Attending: Cardiology | Admitting: Pulmonary Disease

## 2024-06-01 DIAGNOSIS — R55 Syncope and collapse: Secondary | ICD-10-CM

## 2024-06-01 DIAGNOSIS — R001 Bradycardia, unspecified: Secondary | ICD-10-CM

## 2024-06-20 ENCOUNTER — Ambulatory Visit (INDEPENDENT_AMBULATORY_CARE_PROVIDER_SITE_OTHER)

## 2024-06-20 DIAGNOSIS — I429 Cardiomyopathy, unspecified: Secondary | ICD-10-CM

## 2024-06-20 LAB — CUP PACEART REMOTE DEVICE CHECK
Date Time Interrogation Session: 20251026233749
Implantable Pulse Generator Implant Date: 20240805

## 2024-06-21 ENCOUNTER — Telehealth: Payer: Self-pay | Admitting: Cardiology

## 2024-06-21 NOTE — Telephone Encounter (Signed)
 Left message for pt letting him know that we need to schedule him an appointment to see gen cards. Pt was last seen by Rosaline Pavy, PA on 10/08/21 and is a previous pt of Dr. Claudene, therefore pt is established with gen cards. Pt can see APP or cardiologist with office visit slots open.

## 2024-06-21 NOTE — Telephone Encounter (Signed)
   Pt c/o of Chest Pain: STAT if active CP, including tightness, pressure, jaw pain, radiating pain to shoulder/upper arm/back, CP unrelieved by Nitro. Symptoms reported of SOB, nausea, vomiting, sweating.  1. Are you having CP right now? No    2. Are you experiencing any other symptoms (ex. SOB, nausea, vomiting, sweating)? Pain down left art    3. Is your CP continuous or coming and going? N/a   4. Have you taken Nitroglycerin ? No    5. How long have you been experiencing CP? N/a     6. If NO CP at time of call then end call with telling Pt to call back or call 911 if Chest pain returns prior to return call from triage team.   Pt denies chest pain, but has pain down his left arm. [facility] rep calling to schedule him an appt  but pt has not seen gen card.

## 2024-06-22 ENCOUNTER — Ambulatory Visit: Payer: Self-pay | Admitting: Cardiology

## 2024-06-22 NOTE — Telephone Encounter (Signed)
 Follow up scheduled

## 2024-06-23 NOTE — Progress Notes (Signed)
 Remote Loop Recorder Transmission

## 2024-06-29 NOTE — Progress Notes (Unsigned)
 Electrophysiology Office Note:   Date:  06/30/2024  ID:  Luis Carter, DOB 09-21-1945, MRN [id number]  Primary Cardiologist: None Primary Heart Failure: None Electrophysiologist: OLE ONEIDA HOLTS, MD      History of Present Illness:   Luis Carter is a 78 y.o. male with h/o bradycardia, HFrEF, HTN, HLD, DM II, CKD 3b, cocaine use, seizures, tobacco abuse seen today for routine electrophysiology followup.   Seen in EP Clinic 03/25/24 after ILR alerted for 2 pauses events > longest lasting 3 seconds. Additional brady episode lasting 2 minutes. He reported pre-syncope symptoms with an episode of brady with HR <30 bpm for over 2 minutes. He was planned for dual chamber PPM with LBA lead at that time. He was having upper extremity pain and he wanted to see his PCP before proceeding with PPM implant.    Seen in ER on 05/19/24 for elevated blood pressure during home health check. Initial BP 197/85 at the [facility].  She was referred to ER with SBP > 200 mmHg. BP improved in ER.  He was given robaxin  for muscle cramps.   Since last being seen in our clinic the patient reports doing he is not interested in having a device put in at this time. He would like to continue to watch with the loop recorder.  He has persistent complaints about his limited shoulder mobility on the left and shooting pains that go down the arm and leg. He has not had any further presyncopal episodes.  Has not passed out. Sister accompanies him on today's visit.     He denies chest pain, palpitations, dyspnea, PND, orthopnea, nausea, vomiting, dizziness, syncope, edema, weight gain, or early satiety.   Review of systems complete and found to be negative unless listed in HPI.   EP Information / Studies Reviewed:    EKG is not ordered today. EKG from 05/20/24 reviewed which showed SB 49 bpm       Arrhythmia / AAD / Pertinent EP Studies Nola / Pauses    Device  MDT ILR 03/30/23 > implanted for sinus node dysfunction     Risk Assessment/Calculations:              Physical Exam:   VS:  BP 128/74   Pulse 78   Ht 5' 3 (1.6 m)   Wt 193 lb (87.5 kg)   SpO2 95%   BMI 34.19 kg/m    Wt Readings from Last 3 Encounters:  06/30/24 193 lb (87.5 kg)  05/19/24 160 lb (72.6 kg)  04/20/24 160 lb (72.6 kg)     GEN: Well nourished, well developed in no acute distress NECK: No JVD; No carotid bruits CARDIAC: Regular rate and rhythm, no murmurs, rubs, gallops RESPIRATORY:  Clear to auscultation without rales, wheezing or rhonchi  ABDOMEN: Soft, non-tender, non-distended EXTREMITIES:  No edema; No deformity     ASSESSMENT AND PLAN:    Symptomatic Bradycardia  Near Syncope -ILR review shows, presenting NSR, no new episodes of pauses or prolonged brady -no reversible causes identified  -reviewed conduction system with patient and sister and indications for PPM, discussed signs of progressive electrical failure and when to report to the ER -prior rec's for PPM per Dr. Holts but pt declined, continues to decline   Limited Shoulder ROM  Leg / Shoulder Pain  -pt encouraged to call Dr. Shelda to make an appt to have his shoulder evaluated > he has had persistent complaints of this at least since 03/2024 and has not  been seen for it. He was given muscle relaxers in the ER when there for elevated BP   Follow up with EP APP 4 months  to review for further conduction issues / progression.  Will need new EP MD.   Signed, Daphne Barrack, NP-C, AGACNP-BC [facility] - Electrophysiology  06/30/2024, 8:57 AM

## 2024-06-30 ENCOUNTER — Encounter: Payer: Self-pay | Admitting: Pulmonary Disease

## 2024-06-30 ENCOUNTER — Ambulatory Visit: Attending: Pulmonary Disease | Admitting: Pulmonary Disease

## 2024-06-30 VITALS — BP 128/74 | HR 78 | Ht 63.0 in | Wt 193.0 lb

## 2024-06-30 DIAGNOSIS — R001 Bradycardia, unspecified: Secondary | ICD-10-CM

## 2024-06-30 DIAGNOSIS — R55 Syncope and collapse: Secondary | ICD-10-CM | POA: Diagnosis not present

## 2024-06-30 NOTE — Patient Instructions (Signed)
 Medication Instructions:  No medications changes today   Call Dr. Shelda regarding your shoulder issues.  You may need to see Ortho.   *If you need a refill on your cardiac medications before your next appointment, please call your pharmacy*  Lab Work: No lab work today If you have labs (blood work) drawn today and your tests are completely normal, you will receive your results only by: MyChart Message (if you have MyChart) OR A paper copy in the mail If you have any lab test that is abnormal or we need to change your treatment, we will call you to review the results.  Testing/Procedures: No testing/procedures were scheduled today  Follow-Up: At [facility], you and your health needs are our priority.  As part of our continuing mission to provide you with exceptional heart care, our providers are all part of one team.  This team includes your primary Cardiologist (physician) and Advanced Practice Providers or APPs (Physician Assistants and Nurse Practitioners) who all work together to provide you with the care you need, when you need it.  Your next appointment:   4 month(s)  Provider:   You may see OLE ONEIDA HOLTS, MD or one of the following Advanced Practice Providers on your designated Care Team:    Daphne Barrack, NP    We recommend signing up for the patient portal called MyChart.  Sign up information is provided on this After Visit Summary.  MyChart is used to connect with patients for Virtual Visits (Telemedicine).  Patients are able to view lab/test results, encounter notes, upcoming appointments, etc.  Non-urgent messages can be sent to your provider as well.   To learn more about what you can do with MyChart, go to [email or web address].

## 2024-07-20 ENCOUNTER — Encounter: Payer: Self-pay | Admitting: Podiatry

## 2024-07-20 ENCOUNTER — Ambulatory Visit (INDEPENDENT_AMBULATORY_CARE_PROVIDER_SITE_OTHER): Admitting: Podiatry

## 2024-07-20 DIAGNOSIS — Z0189 Encounter for other specified special examinations: Secondary | ICD-10-CM | POA: Diagnosis not present

## 2024-07-20 DIAGNOSIS — B351 Tinea unguium: Secondary | ICD-10-CM | POA: Diagnosis not present

## 2024-07-20 DIAGNOSIS — E119 Type 2 diabetes mellitus without complications: Secondary | ICD-10-CM

## 2024-07-20 DIAGNOSIS — M79675 Pain in left toe(s): Secondary | ICD-10-CM

## 2024-07-20 DIAGNOSIS — M79674 Pain in right toe(s): Secondary | ICD-10-CM

## 2024-07-20 DIAGNOSIS — E1142 Type 2 diabetes mellitus with diabetic polyneuropathy: Secondary | ICD-10-CM

## 2024-07-20 NOTE — Progress Notes (Signed)
 Subjective:  Patient ID: Luis Carter, male    DOB: Nov 18, 1945,   MRN: [id number]  Chief Complaint  Patient presents with   [facility]    [facility] 1st met dorsal callus pain.  A1c 6.5. 81 mg Asprin    78 y.o. male presents for concern of thickened elongated and painful nails that are difficult to trim. Requesting to have them trimmed today. Relates burning and tingling in their feet. Patient is diabetic and last A1c was  Lab Results  Component Value Date   HGBA1C 6.5 (H) 11/09/2023   .  He also relates concern of bump on the top of his left  foot that is painful.  PCP:  Shelda Atlas, MD    . Denies any other pedal complaints. Denies n/v/f/c.   Past Medical History:  Diagnosis Date   Acute exacerbation of chronic obstructive pulmonary disease (COPD) (HCC) 03/11/2021   Arthritis    left arm/shoulder; both legs (12/12/2015)   ASTHMA    since childhood   Cardiomyopathy (HCC) 01/25/2016   Echo 12/13/15 - Mild LVH, EF 45-50%, normal wall motion, grade 1 diastolic dysfunction, mild LAE, mild RVE, mild RAE   CHF (congestive heart failure) (HCC)    COPD 04/14/2007   Coronary artery disease    DIABETES MELLITUS, TYPE II 04/14/2007   GERD (gastroesophageal reflux disease)    GI bleed    Gout    High cholesterol    History of nuclear stress test    a.  Myoview  6/17: EF 54%, no ischemia   HYPERTENSION 04/14/2007   LEG PAIN, RIGHT 05/19/2008   LOW BACK PAIN 04/14/2007   Myocardial infarction (HCC)    ~ 2001/notes 09/13/2001 (03/22/2013)   PANCREATITIS, HX OF 04/14/2007   Pneumonia    twice (12/12/2015)   SEIZURE DISORDER 04/14/2007   used to have them when I was young (03/22/2013)   Shortness of breath    related to asthma (03/22/2013)    Objective:  Physical Exam: Vascular: DP/PT pulses 2/4 bilateral. CFT <3 seconds. Feet cold to touch. Absent hair growth on digits. Edema noted to bilateral lower extremities. Xerosis noted bilaterally.  Skin. No lacerations or abrasions  bilateral feet. Nails 1-5 bilateral  are thickened discolored and elongated with subungual debris.  Musculoskeletal: MMT 5/5 bilateral lower extremities in DF, PF, Inversion and Eversion. Deceased ROM in DF of ankle joint.  Spurring and tophi present over the left foot first metatarsal phalangeal joint Neurological: Sensation intact to light touch. Protective sensation diminished bilateral.    Assessment:   1. Pain due to onychomycosis of toenails of both feet   2. Type 2 diabetes mellitus with peripheral neuropathy (HCC)   3. Encounter for diabetic foot exam (HCC)      Plan:  Patient was evaluated and treated and all questions answered. -Discussed and educated patient on diabetic foot care, especially with  regards to the vascular, neurological and musculoskeletal systems.  -Stressed the importance of good glycemic control and the detriment of not  controlling glucose levels in relation to the foot. -Discussed supportive shoes at all times and checking feet regularly.  -Mechanically debrided all nails 1-5 bilateral using sterile nail nipper and filed with dremel without incident  -Answered all patient questions -Discussed padding and wider shoe gear to help with the tophi and gout present in the first MPJ. -Patient to return  in 3 months for at risk foot care -Patient advised to call the office if any problems or questions arise in the meantime.  Asberry Failing, DPM

## 2024-07-21 ENCOUNTER — Ambulatory Visit

## 2024-07-21 DIAGNOSIS — R001 Bradycardia, unspecified: Secondary | ICD-10-CM | POA: Diagnosis not present

## 2024-07-23 LAB — CUP PACEART REMOTE DEVICE CHECK
Date Time Interrogation Session: 20251126233035
Implantable Pulse Generator Implant Date: 20240805

## 2024-07-25 ENCOUNTER — Ambulatory Visit: Payer: Self-pay | Admitting: Cardiology

## 2024-07-27 NOTE — Progress Notes (Signed)
 Remote Loop Recorder Transmission

## 2024-08-21 ENCOUNTER — Ambulatory Visit

## 2024-08-21 DIAGNOSIS — R001 Bradycardia, unspecified: Secondary | ICD-10-CM | POA: Diagnosis not present

## 2024-08-23 ENCOUNTER — Ambulatory Visit: Payer: Self-pay | Admitting: Cardiology

## 2024-08-23 LAB — CUP PACEART REMOTE DEVICE CHECK
Date Time Interrogation Session: 20251227233033
Implantable Pulse Generator Implant Date: 20240805

## 2024-08-29 NOTE — Progress Notes (Signed)
 Remote Loop Recorder Transmission

## 2024-09-21 ENCOUNTER — Ambulatory Visit

## 2024-09-21 DIAGNOSIS — R001 Bradycardia, unspecified: Secondary | ICD-10-CM

## 2024-09-21 LAB — CUP PACEART REMOTE DEVICE CHECK
Date Time Interrogation Session: 20260127233203
Implantable Pulse Generator Implant Date: 20240805

## 2024-09-22 ENCOUNTER — Ambulatory Visit: Payer: Self-pay | Admitting: Cardiology

## 2024-09-29 NOTE — Progress Notes (Signed)
 Remote Loop Recorder Transmission

## 2024-10-22 ENCOUNTER — Encounter

## 2024-10-31 ENCOUNTER — Ambulatory Visit: Admitting: Pulmonary Disease

## 2024-11-22 ENCOUNTER — Encounter

## 2024-12-23 ENCOUNTER — Encounter
